# Patient Record
Sex: Female | Born: 1960 | Race: White | Hispanic: No | Marital: Single | Smoking: Never smoker
Health system: Southern US, Community
[De-identification: ages and names within clinical notes are randomized; demographics above are authoritative.]

## PROBLEM LIST (undated history)

## (undated) DIAGNOSIS — E119 Type 2 diabetes mellitus without complications: Secondary | ICD-10-CM

## (undated) DIAGNOSIS — E559 Vitamin D deficiency, unspecified: Secondary | ICD-10-CM

## (undated) DIAGNOSIS — I10 Essential (primary) hypertension: Secondary | ICD-10-CM

## (undated) DIAGNOSIS — E785 Hyperlipidemia, unspecified: Secondary | ICD-10-CM

## (undated) DIAGNOSIS — R7309 Other abnormal glucose: Secondary | ICD-10-CM

## (undated) HISTORY — PX: WRIST SURGERY: SHX841

## (undated) HISTORY — DX: Hyperlipidemia, unspecified: E78.5

## (undated) HISTORY — DX: Vitamin D deficiency, unspecified: E55.9

## (undated) HISTORY — PX: CARPAL TUNNEL RELEASE: SHX101

## (undated) HISTORY — DX: Other abnormal glucose: R73.09

## (undated) HISTORY — PX: CHOLECYSTECTOMY: SHX55

## (undated) HISTORY — PX: APPENDECTOMY: SHX54

---

## 2016-04-19 ENCOUNTER — Emergency Department (HOSPITAL_COMMUNITY)
Admission: EM | Admit: 2016-04-19 | Discharge: 2016-04-19 | Disposition: A | Discharge status: Home / Self Care | Payer: Self-pay | Attending: Emergency Medicine | Admitting: Emergency Medicine

## 2016-04-19 ENCOUNTER — Encounter (HOSPITAL_COMMUNITY): Payer: Self-pay | Admitting: *Deleted

## 2016-04-19 ENCOUNTER — Emergency Department (HOSPITAL_COMMUNITY): Payer: Self-pay

## 2016-04-19 DIAGNOSIS — Z7984 Long term (current) use of oral hypoglycemic drugs: Secondary | ICD-10-CM | POA: Insufficient documentation

## 2016-04-19 DIAGNOSIS — X501XXA Overexertion from prolonged static or awkward postures, initial encounter: Secondary | ICD-10-CM | POA: Insufficient documentation

## 2016-04-19 DIAGNOSIS — Y939 Activity, unspecified: Secondary | ICD-10-CM | POA: Insufficient documentation

## 2016-04-19 DIAGNOSIS — I10 Essential (primary) hypertension: Secondary | ICD-10-CM | POA: Insufficient documentation

## 2016-04-19 DIAGNOSIS — M79671 Pain in right foot: Secondary | ICD-10-CM | POA: Insufficient documentation

## 2016-04-19 DIAGNOSIS — Y929 Unspecified place or not applicable: Secondary | ICD-10-CM | POA: Insufficient documentation

## 2016-04-19 DIAGNOSIS — E119 Type 2 diabetes mellitus without complications: Secondary | ICD-10-CM | POA: Insufficient documentation

## 2016-04-19 DIAGNOSIS — Y990 Civilian activity done for income or pay: Secondary | ICD-10-CM | POA: Insufficient documentation

## 2016-04-19 DIAGNOSIS — Z79899 Other long term (current) drug therapy: Secondary | ICD-10-CM | POA: Insufficient documentation

## 2016-04-19 HISTORY — DX: Essential (primary) hypertension: I10

## 2016-04-19 HISTORY — DX: Type 2 diabetes mellitus without complications: E11.9

## 2016-04-19 MED ORDER — IBUPROFEN 800 MG PO TABS: 800.0000 mg | ORAL_TABLET | Freq: Three times a day (TID) | ORAL | 0 refills | Status: DC

## 2016-04-19 MED ORDER — METHOCARBAMOL 500 MG PO TABS: 500.0000 mg | ORAL_TABLET | Freq: Two times a day (BID) | ORAL | 0 refills | Status: DC

## 2016-04-19 NOTE — ED Triage Notes (Signed)
Pt reports RT foot pain starting on SAt. Pt does stand a lot with her job.

## 2016-04-19 NOTE — ED Notes (Signed)
Declined W/C at D/C and was escorted to lobby by RN. 

## 2016-04-19 NOTE — ED Provider Notes (Signed)
[facility]-EMERGENCY DEPT Provider Note   CSN: [id number] Arrival date & time: 04/19/16  [id number]  By signing my name below, I, Orpah CobbMaurice Copeland, attest that this documentation has been prepared under the direction and in the presence of Tearah Saulsbury, PA-C. Electronically Signed: Orpah CobbMaurice Copeland , ED Scribe. 04/19/16. 10:56 AM.   History   Chief Complaint Chief Complaint  Patient presents with  . Foot Pain    HPI  Wanda Weaver is a 56 y.o. female who presents to the Emergency Department complaining of mild to moderate R foot pain with sudden onset x1 day. 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 This is both a new job and a new line of work for her. Yesterday, while at work she started experiencing burning and throbbing pain radiating from the R foot upward. Pt has used Meloxicam 15mg  and ice with mild relief and reports the pain being exacerbated with ambulation and movement of toes. Pt reports a pulling sensation with movement of the foot/ankle. Denies neuro deficits, known trauma, or any other complaints.     The history is provided by the patient. No language interpreter was used.    Past Medical History:  Diagnosis Date  . Diabetes mellitus without complication (HCC)   . Hypertension     There are no active problems to display for this patient.   Past Surgical History:  Procedure Laterality Date  . APPENDECTOMY    . CHOLECYSTECTOMY    . WRIST SURGERY Bilateral     OB History    No data available       Home Medications    Prior to Admission medications   Medication Sig Start Date End Date Taking? Authorizing Provider  lisinopril (PRINIVIL,ZESTRIL) 20 MG tablet Take 20 mg by mouth daily.   Yes Historical Provider, MD  meloxicam (MOBIC) 15 MG tablet Take 15 mg by mouth daily.   Yes Historical Provider, MD  metFORMIN (GLUCOPHAGE) 500 MG tablet Take 500 mg by mouth 2 (two) times daily with a meal.   Yes Historical Provider, MD  ibuprofen (ADVIL,MOTRIN) 800 MG  tablet Take 1 tablet (800 mg total) by mouth 3 (three) times daily. 04/19/16   Lukas Pelcher C Crystallee Werden, PA-C  methocarbamol (ROBAXIN) 500 MG tablet Take 1 tablet (500 mg total) by mouth 2 (two) times daily. 04/19/16   Anselm PancoastShawn C Paquita Printy, PA-C    Family History History reviewed. No pertinent family history.  Social History Social History  Substance Use Topics  . Smoking status: Never Smoker  . Smokeless tobacco: Never Used  . Alcohol use No     Allergies   Patient has no known allergies.   Review of Systems Review of Systems  Musculoskeletal: Positive for myalgias (R foot).  Skin: Negative for wound.  Neurological: Negative for weakness and numbness.     Physical Exam Updated Vital Signs BP 171/87 (BP Location: Right Arm)   Pulse 94   Temp 98.7 F (37.1 C) (Oral)   Resp 16   Ht 5\' 2"  (1.575 m)   Wt 192 lb (87.1 kg)   SpO2 99%   BMI 35.12 kg/m   Physical Exam  Constitutional: She appears well-developed and well-nourished. No distress.  HENT:  Head: Normocephalic and atraumatic.  Eyes: Conjunctivae are normal.  Neck: Neck supple.  Cardiovascular: Normal rate, regular rhythm and intact distal pulses.   Pulmonary/Chest: Effort normal.  Musculoskeletal: She exhibits tenderness. She exhibits no deformity.       Right foot:  There is tenderness and swelling.  Tenderness and minor swelling of the dorsal surface of the R foot. No noted crepitus, erythema, or deformity. Full ROM of the R ankle and foot. Pt is weightbearing.  Neurological: She is alert. She has normal strength. No sensory deficit.  No sensory deficits. Strength is 5/5 in bilateral feet and ankles.  Skin: Skin is warm and dry. Capillary refill takes less than 2 seconds. She is not diaphoretic.  Psychiatric: She has a normal mood and affect. Her behavior is normal.  Nursing note and vitals reviewed.    ED Treatments / Results   DIAGNOSTIC STUDIES: Oxygen Saturation is 99% on RA, normal by my interpretation.    COORDINATION OF CARE: 10:56 AM-Discussed next steps with pt. Pt verbalized understanding and is agreeable with the plan.    (all labs ordered are listed, but only abnormal results are displayed) Labs Reviewed - No data to display  EKG  EKG Interpretation None       Radiology Dg Foot Complete Right  Result Date: 04/19/2016 CLINICAL DATA:  Acute onset of right foot pain and burning/paresthesias while at work yesterday. No known injuries. EXAM: RIGHT FOOT COMPLETE - 3+ VIEW COMPARISON:  None. FINDINGS: No evidence of acute or subacute fracture or dislocation. Well-preserved joint spaces. Well-preserved bone mineral density. Small plantar calcaneal spur. Enthesopathic spurring at the insertion of the Achilles tendon on the posterior calcaneus. Calcification involving the anterior and posterior tibial arteries in the distal lower leg. IMPRESSION: 1. No acute or subacute osseous abnormality. 2. Small plantar calcaneal spur. 3. Atherosclerosis involving the anterior and posterior tibial arteries of the lower leg. Electronically Signed   By: Hulan Saas M.D.   On: 04/19/2016 10:22    Procedures Procedures (including critical care time)  Medications Ordered in ED Medications - No data to display   Initial Impression / Assessment and Plan / ED Course  I have reviewed the triage vital signs and the nursing notes.  Pertinent labs & imaging results that were available during my care of the patient were reviewed by me and considered in my medical decision making (see chart for details).  Clinical Course     Patient presents with pain to the right foot. Suspect patient's pain is connected with her increased time on her feet. Doubt DVT. No acute abnormalities on x-ray. Home care and return precautions discussed. Patient voices understanding of all instructions and is comfortable with discharge.    Final Clinical Impressions(s) / ED Diagnoses   Final diagnoses:  Foot pain, right     New Prescriptions New Prescriptions   IBUPROFEN (ADVIL,MOTRIN) 800 MG TABLET    Take 1 tablet (800 mg total) by mouth 3 (three) times daily.   METHOCARBAMOL (ROBAXIN) 500 MG TABLET    Take 1 tablet (500 mg total) by mouth 2 (two) times daily.   I personally performed the services described in this documentation, which was scribed in my presence. The recorded information has been reviewed and is accurate.    Anselm Pancoast, PA-C 04/19/16 1056    Anselm Pancoast, PA-C 04/19/16 1057    Lorre Nick, MD 04/21/16 [phone]

## 2016-04-19 NOTE — Discharge Instructions (Signed)
There were no abnormalities on the x-ray. Expect your soreness to increase over the next 2-3 days. Take it easy, but do not lay around too much as this may make any stiffness worse. Take 500 mg of naproxen every 12 hours or 800 mg of ibuprofen every 8 hours for the next 3 days. Take these medications with food to avoid upset stomach. Robaxin is a muscle relaxer and may help loosen stiff or spasming muscles. Do not take the Robaxin while driving or performing other dangerous activities. Be sure to perform the attached exercises starting with three times a week and working up to performing them daily. This is an essential part of preventing long term problems. Follow up with a primary care provider for any future management of these complaints.  Use the crutches and Ace wrap as needed for comfort and stability. Weightbearing as tolerated. Elevate the extremity whenever possible.

## 2016-05-03 ENCOUNTER — Emergency Department (HOSPITAL_COMMUNITY)
Admission: EM | Admit: 2016-05-03 | Discharge: 2016-05-03 | Disposition: A | Discharge status: Home / Self Care | Payer: Self-pay | Attending: Physician Assistant | Admitting: Physician Assistant

## 2016-05-03 ENCOUNTER — Encounter (HOSPITAL_COMMUNITY): Payer: Self-pay | Admitting: Emergency Medicine

## 2016-05-03 DIAGNOSIS — Z79899 Other long term (current) drug therapy: Secondary | ICD-10-CM | POA: Insufficient documentation

## 2016-05-03 DIAGNOSIS — E119 Type 2 diabetes mellitus without complications: Secondary | ICD-10-CM | POA: Insufficient documentation

## 2016-05-03 DIAGNOSIS — J069 Acute upper respiratory infection, unspecified: Secondary | ICD-10-CM | POA: Insufficient documentation

## 2016-05-03 DIAGNOSIS — J309 Allergic rhinitis, unspecified: Secondary | ICD-10-CM | POA: Insufficient documentation

## 2016-05-03 DIAGNOSIS — Z7984 Long term (current) use of oral hypoglycemic drugs: Secondary | ICD-10-CM | POA: Insufficient documentation

## 2016-05-03 DIAGNOSIS — I10 Essential (primary) hypertension: Secondary | ICD-10-CM | POA: Insufficient documentation

## 2016-05-03 MED ORDER — BENZONATATE 100 MG PO CAPS: 100.0000 mg | ORAL_CAPSULE | Freq: Three times a day (TID) | ORAL | 0 refills | Status: DC | PRN

## 2016-05-03 MED ORDER — FLUTICASONE PROPIONATE 50 MCG/ACT NA SUSP: 2.0000 | Freq: Every day | NASAL | 0 refills | Status: DC

## 2016-05-03 MED ORDER — CETIRIZINE HCL 10 MG PO TABS: 10.0000 mg | ORAL_TABLET | Freq: Every day | ORAL | 1 refills | Status: DC

## 2016-05-03 NOTE — ED Notes (Signed)
arrived

## 2016-05-03 NOTE — ED Notes (Signed)
Declined W/C at D/C and was escorted to lobby by RN. 

## 2016-05-03 NOTE — ED Triage Notes (Signed)
Pt reports nasal congestion for two days. Pt also reports she is losing her voice. Airway intact. Denies sore throat. Pt states she has had a cough.

## 2016-05-03 NOTE — ED Provider Notes (Signed)
[facility]-EMER[facility]ENCY DEPT Provider Note   CSN: [id number] Arrival date & time: 05/03/16  1125   By signing my name below, I, Valentino Saxon, attest that this documentation has been prepared under the direction and in the presence of Everlene Farrier, PA-C. Electronically Signed: Valentino Saxon, ED Scribe. 05/03/16. 1:42 PM.  History   Chief Complaint Chief Complaint  Patient presents with  . Nasal Congestion   The history is provided by the patient. No language interpreter was used.   HPI Comments: Wanda Weaver is a 56 y.o. female who presents to the Emergency Department complaining of moderate, constant, nasal congestion onset three days ago. She reports associated runny nose, postnasal drip and slight intermittent cough from post nasal drip. Pt notes using cough suppressants, Afrin Nasal spray along with a nasal decongestant with minimal relief. Denies sore throat, ear pain, CP, wheezing, fever, chills, nausea, vomiting, diarrhea, back pain.   Past Medical History:  Diagnosis Date  . Diabetes mellitus without complication (HCC)   . Hypertension     There are no active problems to display for this patient.   Past Surgical History:  Procedure Laterality Date  . APPENDECTOMY    . CHOLECYSTECTOMY    . WRIST SURGERY Bilateral     OB History    No data available       Home Medications    Prior to Admission medications   Medication Sig Start Date End Date Taking? Authorizing Provider  benzonatate (TESSALON) 100 MG capsule Take 1 capsule (100 mg total) by mouth 3 (three) times daily as needed. 05/03/16   Everlene Farrier, PA-C  cetirizine (ZYRTEC ALLERGY) 10 MG tablet Take 1 tablet (10 mg total) by mouth daily. 05/03/16   Everlene Farrier, PA-C  fluticasone (FLONASE) 50 MCG/ACT nasal spray Place 2 sprays into both nostrils daily. 05/03/16   Everlene Farrier, PA-C  ibuprofen (ADVIL,MOTRIN) 800 MG tablet Take 1 tablet (800 mg total) by mouth 3 (three) times daily. 04/19/16   Shawn C Joy, PA-C    lisinopril (PRINIVIL,ZESTRIL) 20 MG tablet Take 20 mg by mouth daily.    Historical Provider, MD  meloxicam (MOBIC) 15 MG tablet Take 15 mg by mouth daily.    Historical Provider, MD  metFORMIN (GLUCOPHAGE) 500 MG tablet Take 500 mg by mouth 2 (two) times daily with a meal.    Historical Provider, MD  methocarbamol (ROBAXIN) 500 MG tablet Take 1 tablet (500 mg total) by mouth 2 (two) times daily. 04/19/16   Anselm Pancoast, PA-C    Family History History reviewed. No pertinent family history.  Social History Social History  Substance Use Topics  . Smoking status: Never Smoker  . Smokeless tobacco: Never Used  . Alcohol use No     Allergies   Patient has no known allergies.   Review of Systems Review of Systems  Constitutional: Negative for chills and fever.  HENT: Positive for congestion, postnasal drip and rhinorrhea. Negative for ear pain, sore throat and trouble swallowing.   Eyes: Negative for visual disturbance.  Respiratory: Positive for cough. Negative for shortness of breath and wheezing.   Cardiovascular: Negative for chest pain.  Gastrointestinal: Negative for abdominal pain, diarrhea, nausea and vomiting.  Musculoskeletal: Negative for back pain.  Skin: Negative for rash.  Neurological: Negative for light-headedness and headaches.     Physical Exam Updated Vital Signs BP 145/89 (BP Location: Left Arm)   Pulse 96   Temp 99.2 F (37.3 C) (Oral)   Resp 15   Ht 5'  2" (1.575 m)   Wt 184 lb (83.5 kg)   SpO2 97%   BMI 33.65 kg/m   Physical Exam  Constitutional: She appears well-developed and well-nourished. No distress.  Nontoxic appearing.  HENT:  Head: Normocephalic and atraumatic.  Right Ear: External ear normal.  Left Ear: External ear normal.  Mouth/Throat: Oropharynx is clear and moist. No oropharyngeal exudate.  Mild mild ear effusion bilaterally. NoTM erythema or loss of landmarks.  Boggy nasal turbinates bilaterally. Rhinorrhea present. No tonsillar  hypertrophy or exudates. Throat is clear.  Eyes: Conjunctivae are normal. Pupils are equal, round, and reactive to light. Right eye exhibits no discharge. Left eye exhibits no discharge.  Neck: Normal range of motion. Neck supple. No JVD present. No tracheal deviation present.  Cardiovascular: Normal rate, regular rhythm, normal heart sounds and intact distal pulses.   Pulmonary/Chest: Effort normal and breath sounds normal. No stridor. No respiratory distress. She has no wheezes. She has no rales.  Lungs clear to auscultation bilaterally.  Abdominal: Soft. There is no tenderness.  Lymphadenopathy:    She has no cervical adenopathy.  Neurological: She is alert. Coordination normal.  Skin: Skin is warm and dry. Capillary refill takes less than 2 seconds. No rash noted. She is not diaphoretic. No erythema. No pallor.  Psychiatric: She has a normal mood and affect. Her behavior is normal.  Nursing note and vitals reviewed.    ED Treatments / Results   DIAGNOSTIC STUDIES: Oxygen Saturation is 97% on RA, normal by my interpretation.    COORDINATION OF CARE: 1:31 PM Discussed treatment plan with pt at bedside and pt agreed to plan.   Labs (all labs ordered are listed, but only abnormal results are displayed) Labs Reviewed - No data to display  EKG  EKG Interpretation None       Radiology No results found.  Procedures Procedures (including critical care time)  Medications Ordered in ED Medications - No data to display   Initial Impression / Assessment and Plan / ED Course  I have reviewed the triage vital signs and the nursing notes.  Pertinent labs & imaging results that were available during my care of the patient were reviewed by me and considered in my medical decision making (see chart for details).     This is a 56 y.o. female who presents to the Emergency Department complaining of moderate, constant, nasal congestion onset three days ago. She reports associated  runny nose, postnasal drip and slight intermittent cough from post nasal drip.  On exam the patient is afebrile nontoxic appearing. Throat is clear. Evidence of postnasal drip. Rhinorrhea present. Lungs clear auscultation bilaterally. Patient with upper respiratory infection. Will discharge with pressures of her Flonase, Zyrtec, Tessalon Perles. I discussed return precautions. I advised the patient to follow-up with their primary care provider this week. I advised the patient to return to the emergency department with new or worsening symptoms or new concerns. The patient verbalized understanding and agreement with plan.     Final Clinical Impressions(s) / ED Diagnoses   Final diagnoses:  Viral upper respiratory tract infection  Acute allergic rhinitis, unspecified seasonality, unspecified trigger    New Prescriptions New Prescriptions   BENZONATATE (TESSALON) 100 MG CAPSULE    Take 1 capsule (100 mg total) by mouth 3 (three) times daily as needed.   CETIRIZINE (ZYRTEC ALLERGY) 10 MG TABLET    Take 1 tablet (10 mg total) by mouth daily.   FLUTICASONE (FLONASE) 50 MCG/ACT NASAL SPRAY  Place 2 sprays into both nostrils daily.    I personally performed the services described in this documentation, which was scribed in my presence. The recorded information has been reviewed and is accurate.      Everlene FarrierWilliam Harumi Yamin, PA-C 05/03/16 1351    Courteney Lyn Mackuen, MD 05/04/16 [phone]

## 2016-09-06 ENCOUNTER — Emergency Department (HOSPITAL_COMMUNITY)
Admission: EM | Admit: 2016-09-06 | Discharge: 2016-09-07 | Disposition: A | Discharge status: Home / Self Care | Payer: Worker's Compensation | Attending: Emergency Medicine | Admitting: Emergency Medicine

## 2016-09-06 ENCOUNTER — Emergency Department (HOSPITAL_COMMUNITY): Payer: Worker's Compensation

## 2016-09-06 ENCOUNTER — Encounter (HOSPITAL_COMMUNITY): Payer: Self-pay | Admitting: Emergency Medicine

## 2016-09-06 DIAGNOSIS — Z79899 Other long term (current) drug therapy: Secondary | ICD-10-CM | POA: Diagnosis not present

## 2016-09-06 DIAGNOSIS — E119 Type 2 diabetes mellitus without complications: Secondary | ICD-10-CM | POA: Diagnosis not present

## 2016-09-06 DIAGNOSIS — Z7984 Long term (current) use of oral hypoglycemic drugs: Secondary | ICD-10-CM | POA: Insufficient documentation

## 2016-09-06 DIAGNOSIS — I10 Essential (primary) hypertension: Secondary | ICD-10-CM | POA: Diagnosis not present

## 2016-09-06 DIAGNOSIS — M25531 Pain in right wrist: Secondary | ICD-10-CM | POA: Insufficient documentation

## 2016-09-06 NOTE — ED Notes (Signed)
Pt from work following an arm injury at 1630. 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 Pt has pain and decreased ROM in 4th and 5th finger and wrist of right arm

## 2016-09-06 NOTE — Discharge Instructions (Signed)
Please use a wrist splint as needed. Keep good range of motion to wrist and use ibuprofen or Tylenol as needed for pain. Use rest, ice, compression, elevation to right hand. Please follow-up with hand surgery, Dr. Amanda PeaGramig, regarding today's visit and 1-2 weeks as needed.   Get help right away if: You lose feeling in your fingers or hand. Your fingers turn white, very red, or cold and blue. You cannot move your fingers. You have a fever or chills.

## 2016-09-06 NOTE — ED Provider Notes (Signed)
[facility]-EMERGENCY DEPT Provider Note   CSN: [id number] Arrival date & time: 09/06/16  2151     History   Chief Complaint Chief Complaint  Patient presents with  . Arm Injury    HPI Wanda Weaver is a 56 y.o. female.  The history is provided by the patient. No language interpreter was used.  Arm Injury   This is a new problem. The current episode started 6 to 12 hours ago. The problem occurs constantly. The problem has been gradually worsening. The pain is present in the right wrist. The quality of the pain is described as pounding. The pain is at a severity of 6/10. Associated symptoms include limited range of motion and tingling (in 4th and 5th fingers of right hand). The symptoms are aggravated by contact. She has tried nothing for the symptoms. History of extremity trauma: wrist was banged against a door.     Past Medical History:  Diagnosis Date  . Diabetes mellitus without complication (HCC)   . Hypertension     There are no active problems to display for this patient.   Past Surgical History:  Procedure Laterality Date  . APPENDECTOMY    . CHOLECYSTECTOMY    . WRIST SURGERY Bilateral     OB History    No data available       Home Medications    Prior to Admission medications   Medication Sig Start Date End Date Taking? Authorizing Provider  benzonatate (TESSALON) 100 MG capsule Take 1 capsule (100 mg total) by mouth 3 (three) times daily as needed. 05/03/16   Everlene Farrier, PA-C  cetirizine (ZYRTEC ALLERGY) 10 MG tablet Take 1 tablet (10 mg total) by mouth daily. 05/03/16   Everlene Farrier, PA-C  fluticasone (FLONASE) 50 MCG/ACT nasal spray Place 2 sprays into both nostrils daily. 05/03/16   Everlene Farrier, PA-C  ibuprofen (ADVIL,MOTRIN) 800 MG tablet Take 1 tablet (800 mg total) by mouth 3 (three) times daily. 04/19/16   Joy, Shawn C, PA-C  lisinopril (PRINIVIL,ZESTRIL) 20 MG tablet Take 20 mg by mouth daily.    [provider]  meloxicam (MOBIC) 15 MG  tablet Take 15 mg by mouth daily.    [provider]  metFORMIN (GLUCOPHAGE) 500 MG tablet Take 500 mg by mouth 2 (two) times daily with a meal.    [provider]  methocarbamol (ROBAXIN) 500 MG tablet Take 1 tablet (500 mg total) by mouth 2 (two) times daily. 04/19/16   Joy, Hillard Danker, PA-C    Family History No family history on file.  Social History Social History  Substance Use Topics  . Smoking status: Never Smoker  . Smokeless tobacco: Never Used  . Alcohol use No     Allergies   Patient has no known allergies.   Review of Systems Review of Systems  Constitutional: Negative for fever.  Musculoskeletal: Positive for arthralgias.  Neurological: Positive for tingling (in 4th and 5th fingers of right hand).     Physical Exam Updated Vital Signs BP (!) 143/83 (BP Location: Left Arm)   Pulse 95   Temp 98.4 F (36.9 C) (Oral)   Resp 16   Ht 5\' 2"  (1.575 m)   Wt 82.6 kg (182 lb)   SpO2 100%   BMI 33.29 kg/m   Physical Exam  Constitutional: She appears well-developed and well-nourished.  Well appearing  HENT:  Head: Normocephalic and atraumatic.  Nose: Nose normal.  Eyes: Conjunctivae and EOM are normal.  Neck: Normal range of motion.  Cardiovascular: Normal rate and intact distal pulses.   2+intact distal pulses.   Pulmonary/Chest: Effort normal. No respiratory distress.  Normal work of breathing. No respiratory distress noted.   Abdominal: Soft.  Musculoskeletal:  Mildly tender to palpation to wrist on dorsal aspect and ulnar side. No obvious pinpoint bony tenderness. No redness, swelling, deformity noted. No wound. Limited range of motion secondary to pain, however full passive range of motion.  No tenderness to anatomical snuffbox to right hand.  Neurological: She is alert. No sensory deficit.  Sensation intact. Muscle strength 5/5 and good against resistance to wrist flexion and extension and handgrip.  Skin: Skin is warm. Capillary refill  takes less than 2 seconds.  Psychiatric: She has a normal mood and affect. Her behavior is normal.  Nursing note and vitals reviewed.    ED Treatments / Results  Labs (all labs ordered are listed, but only abnormal results are displayed) Labs Reviewed - No data to display  EKG  EKG Interpretation None       Radiology Dg Wrist Complete Right  Result Date: 09/06/2016 CLINICAL DATA:  Status post right arm injury, with right wrist pain. Initial encounter. EXAM: RIGHT WRIST - COMPLETE 3+ VIEW COMPARISON:  None. FINDINGS: There is no evidence of fracture or dislocation. The carpal rows are intact, and demonstrate normal alignment. The joint spaces are preserved. No significant soft tissue abnormalities are seen. IMPRESSION: No evidence of fracture or dislocation. Electronically Signed   By: Roanna RaiderJeffery  Chang M.D.   On: 09/06/2016 22:40   Dg Hand Complete Right  Addendum Date: 09/06/2016   ADDENDUM REPORT: 09/06/2016 23:18 ADDENDUM: The Findings should read: There is no evidence of fracture or dislocation. The joint spaces are preserved. The carpal rows are intact, and demonstrate normal alignment. The soft tissues are unremarkable in appearance. Electronically Signed   By: Roanna RaiderJeffery  Chang M.D.   On: 09/06/2016 23:18   Result Date: 09/06/2016 CLINICAL DATA:  Acute onset of pain and decreased range of motion at the right fourth and fifth fingers and right wrist, status post injury. Initial encounter. EXAM: RIGHT HAND - COMPLETE 3+ VIEW COMPARISON:  None. FINDINGS: The stomach is unremarkable in appearance. The small bowel is within normal limits. The appendix is normal in caliber, without evidence of appendicitis. The colon is unremarkable in appearance. IMPRESSION: No evidence of fracture or dislocation. Electronically Signed: By: Roanna RaiderJeffery  Chang M.D. On: 09/06/2016 22:39    Procedures Procedures (including critical care time)  Medications Ordered in ED Medications - No data to  display   Initial Impression / Assessment and Plan / ED Course  I have reviewed the triage vital signs and the nursing notes.  Pertinent labs & imaging results that were available during my care of the patient were reviewed by me and considered in my medical decision making (see chart for details).    Patient X-Ray negative for obvious fracture or dislocation. Pt advised to follow up with hand surgery if symptoms persist for possibility of missed fracture diagnosis. Patient given brace while in ED, conservative therapy recommended and discussed. Patient in NAD, hemodynamically stable, afebrile. Patient will be dc home & is agreeable with above plan. Return precautions given.   Final Clinical Impressions(s) / ED Diagnoses   Final diagnoses:  Right wrist pain    New Prescriptions New Prescriptions   No medications on file     Alvina Chouspina, Audry Pecina Manuel, [place] 09/06/16 2358    Nira Connardama, Pedro Eduardo, MD 09/07/16 [phone]

## 2017-05-04 ENCOUNTER — Emergency Department (HOSPITAL_COMMUNITY)
Admission: EM | Admit: 2017-05-04 | Discharge: 2017-05-04 | Disposition: A | Discharge status: Home / Self Care | Payer: Self-pay | Attending: Emergency Medicine | Admitting: Emergency Medicine

## 2017-05-04 ENCOUNTER — Encounter (HOSPITAL_COMMUNITY): Payer: Self-pay

## 2017-05-04 ENCOUNTER — Other Ambulatory Visit: Payer: Self-pay

## 2017-05-04 ENCOUNTER — Emergency Department (HOSPITAL_COMMUNITY): Payer: Self-pay

## 2017-05-04 DIAGNOSIS — E119 Type 2 diabetes mellitus without complications: Secondary | ICD-10-CM | POA: Insufficient documentation

## 2017-05-04 DIAGNOSIS — M2550 Pain in unspecified joint: Secondary | ICD-10-CM | POA: Insufficient documentation

## 2017-05-04 DIAGNOSIS — Z7984 Long term (current) use of oral hypoglycemic drugs: Secondary | ICD-10-CM | POA: Insufficient documentation

## 2017-05-04 DIAGNOSIS — Z79899 Other long term (current) drug therapy: Secondary | ICD-10-CM | POA: Insufficient documentation

## 2017-05-04 DIAGNOSIS — M25512 Pain in left shoulder: Secondary | ICD-10-CM | POA: Insufficient documentation

## 2017-05-04 DIAGNOSIS — I10 Essential (primary) hypertension: Secondary | ICD-10-CM | POA: Insufficient documentation

## 2017-05-04 MED ORDER — DICLOFENAC SODIUM 3 % TD GEL: 1.0000 "application " | Freq: Two times a day (BID) | TRANSDERMAL | 0 refills | Status: DC

## 2017-05-04 MED ORDER — ACETAMINOPHEN 325 MG PO TABS: 650.0000 mg | ORAL_TABLET | Freq: Four times a day (QID) | ORAL | 1 refills | Status: DC | PRN

## 2017-05-04 NOTE — ED Provider Notes (Signed)
[facility] EMERGENCY [facility]PARTMENT Provider Note   CSN: [id number] Arrival date & time: 05/04/17  1146     History   Chief Complaint Chief Complaint  Patient presents with  . Shoulder Pain    HPI Wanda Weaver is a 57 y.o. female.  Wanda Weaver is a 57 y.o. Female who presents to the emergency department complaining of left shoulder pain for the past 3 days.  Patient reports 3 days she was having some pain with movement in her left shoulder and she moved her arm up and felt a pop which made her pain worse.  She reports she worked her job at [employer] yesterday and did lots of heavy lifting which she believes exacerbated her pain.  She reports she has pain with range of motion of her left shoulder.  If she holds her shoulder still she has less pain.  She has had no chest pain or shortness of breath.  She denies any numbness, tingling or weakness.  She tried using arthritis gel without relief.    The history is provided by the patient and medical records. No language interpreter was used.  Shoulder Pain   Pertinent negatives include no numbness.    Past Medical History:  Diagnosis Date  . Diabetes mellitus without complication (HCC)   . Hypertension     There are no active problems to display for this patient.   Past Surgical History:  Procedure Laterality Date  . APPENDECTOMY    . CHOLECYSTECTOMY    . WRIST SURGERY Bilateral     OB History    No data available       Home Medications    Prior to Admission medications   Medication Sig Start Date End Date Taking? Authorizing Provider  acetaminophen (TYLENOL) 325 MG tablet Take 2 tablets (650 mg total) by mouth every 6 (six) hours as needed for mild pain or moderate pain. 05/04/17   Everlene Farrieransie, Caedyn Tassinari, PA-C  benzonatate (TESSALON) 100 MG capsule Take 1 capsule (100 mg total) by mouth 3 (three) times daily as needed. 05/03/16   Everlene Farrieransie, Elyza Whitt, PA-C  cetirizine (ZYRTEC ALLERGY) 10 MG tablet Take 1 tablet (10  mg total) by mouth daily. 05/03/16   Everlene Farrieransie, Genessis Flanary, PA-C  Diclofenac Sodium 3 % GEL Place 1 application onto the skin 2 (two) times daily. To affected area. 05/04/17   Everlene Farrieransie, Shayana Hornstein, PA-C  fluticasone (FLONASE) 50 MCG/ACT nasal spray Place 2 sprays into both nostrils daily. 05/03/16   Everlene Farrieransie, Marcelene Weidemann, PA-C  ibuprofen (ADVIL,MOTRIN) 800 MG tablet Take 1 tablet (800 mg total) by mouth 3 (three) times daily. 04/19/16   Joy, Shawn C, PA-C  lisinopril (PRINIVIL,ZESTRIL) 20 MG tablet Take 20 mg by mouth daily.    [provider]  meloxicam (MOBIC) 15 MG tablet Take 15 mg by mouth daily.    [provider]  metFORMIN (GLUCOPHAGE) 500 MG tablet Take 500 mg by mouth 2 (two) times daily with a meal.    [provider]  methocarbamol (ROBAXIN) 500 MG tablet Take 1 tablet (500 mg total) by mouth 2 (two) times daily. 04/19/16   Joy, Hillard DankerShawn C, PA-C    Family History No family history on file.  Social History Social History   Tobacco Use  . Smoking status: Never Smoker  . Smokeless tobacco: Never Used  Substance Use Topics  . Alcohol use: No  . Drug use: No     Allergies   Patient has no known allergies.   Review of  Systems Review of Systems  Constitutional: Negative for fever.  Respiratory: Negative for cough and shortness of breath.   Cardiovascular: Negative for chest pain.  Musculoskeletal: Positive for arthralgias. Negative for back pain and neck pain.  Skin: Negative for rash and wound.  Neurological: Negative for weakness and numbness.     Physical Exam Updated Vital Signs BP (!) 192/94 (BP Location: Right Arm)   Pulse 87   Temp 98.3 F (36.8 C) (Oral)   Resp 16   SpO2 100%   Physical Exam  Constitutional: She appears well-developed and well-nourished. No distress.  HENT:  Head: Normocephalic and atraumatic.  Eyes: Right eye exhibits no discharge. Left eye exhibits no discharge.  Cardiovascular: Normal rate, regular rhythm and intact distal pulses.   Bilateral radial pulses are intact.  Pulmonary/Chest: Effort normal. No respiratory distress.  Musculoskeletal: She exhibits tenderness. She exhibits no edema or deformity.  Tenderness noted to the anterior aspect of her left shoulder.  No clavicle tenderness bilaterally.  No overlying skin changes.  Pain is worse with adduction of her left shoulder.  No tenderness noted to her left elbow.  Good strength at her bicep and tricep.  She is able to place her left hand above her head. She has good ROM and strength at left shoulder, albeit with pain.    Neurological: She is alert. No sensory deficit. She exhibits normal muscle tone. Coordination normal.  Skin: Skin is warm and dry. Capillary refill takes less than 2 seconds. No rash noted. She is not diaphoretic. No erythema. No pallor.  Psychiatric: She has a normal mood and affect. Her behavior is normal.  Nursing note and vitals reviewed.    ED Treatments / Results  Labs (all labs ordered are listed, but only abnormal results are displayed) Labs Reviewed - No data to display  EKG  EKG Interpretation None       Radiology Dg Shoulder Left  Result Date: 05/04/2017 CLINICAL DATA:  Raise LEFT arm above head and felt a pop on Sunday, pain since, difficulty raising LEFT arm, history hypertension, diabetes mellitus EXAM: LEFT SHOULDER - 2+ VIEW COMPARISON:  None FINDINGS: Osseous mineralization normal. Mild degenerative changes LEFT AC joint. No acute fracture, dislocation, or bone destruction. Visualized LEFT ribs unremarkable. IMPRESSION: No acute abnormalities. Mild degenerative changes LEFT AC joint. Electronically Signed   By: Ulyses Southward M.D.   On: 05/04/2017 12:27    Procedures Procedures (including critical care time)  Medications Ordered in ED Medications - No data to display   Initial Impression / Assessment and Plan / ED Course  I have reviewed the triage vital signs and the nursing notes.  Pertinent labs & imaging results  that were available during my care of the patient were reviewed by me and considered in my medical decision making (see chart for details).     This is a 57 y.o. Female who presents to the emergency department complaining of left shoulder pain for the past 3 days.  Patient reports 3 days she was having some pain with movement in her left shoulder and she moved her arm up and felt a pop which made her pain worse.  She reports she worked her job at [employer] and did lots of heavy lifting which she believes exacerbated her pain.  She reports she has pain with range of motion of her left shoulder.  If she holds her shoulder still she has less pain.  She has had no chest pain or shortness of  breath.  She denies any numbness, tingling or weakness.  She tried using arthritis gel without relief.  On exam the patient is afebrile and nontoxic-appearing.  She has pain that seems to be worse with adduction of her left shoulder.  She has good strength at her bicep and tricep.  She is able to place her hand above her head.  She has no overlying skin changes.  No clavicle tenderness bilaterally.  X-ray of her left shoulder shows no acute abnormalities.  It does show mild degenerative changes of her left AC joint.  She will need follow-up with orthopedics.  I recommended Tylenol and topical diclofenac gel for her pain due to patient's history of diabetes and hypertension.  Also encouraged ice and rest.  Will provide with work note to return to work with no heavy lifting.  Return precautions discussed. I advised the patient to follow-up with their primary care provider this week. I advised the patient to return to the emergency department with new or worsening symptoms or new concerns. The patient verbalized understanding and agreement with plan.      Final Clinical Impressions(s) / ED Diagnoses   Final diagnoses:  Acute pain of left shoulder    ED Discharge Orders        Ordered    Diclofenac Sodium 3  % GEL  2 times daily     05/04/17 1346    acetaminophen (TYLENOL) 325 MG tablet  Every 6 hours PRN     05/04/17 1346       Everlene Farrier, PA-C 05/04/17 1354    Eber Hong, MD 05/04/17 [phone]

## 2017-05-04 NOTE — ED Notes (Signed)
Pt verbalized understanding of discharge instructions and denies any further questions at this time.   

## 2017-05-04 NOTE — ED Triage Notes (Signed)
Pt states she raised left arm above head on Sunday and felt a "pop". Pt reports she has been experiencing pain in arm since then.

## 2017-05-25 ENCOUNTER — Encounter: Payer: Self-pay | Admitting: Family Medicine

## 2017-05-25 ENCOUNTER — Ambulatory Visit (INDEPENDENT_AMBULATORY_CARE_PROVIDER_SITE_OTHER): Payer: Self-pay | Admitting: Family Medicine

## 2017-05-25 VITALS — BP 150/74 | HR 90 | Temp 98.8°F | Resp 16 | Ht 62.0 in | Wt 186.0 lb

## 2017-05-25 DIAGNOSIS — E118 Type 2 diabetes mellitus with unspecified complications: Secondary | ICD-10-CM

## 2017-05-25 DIAGNOSIS — K429 Umbilical hernia without obstruction or gangrene: Secondary | ICD-10-CM

## 2017-05-25 DIAGNOSIS — Z794 Long term (current) use of insulin: Secondary | ICD-10-CM

## 2017-05-25 DIAGNOSIS — Z1329 Encounter for screening for other suspected endocrine disorder: Secondary | ICD-10-CM

## 2017-05-25 DIAGNOSIS — Z131 Encounter for screening for diabetes mellitus: Secondary | ICD-10-CM

## 2017-05-25 DIAGNOSIS — I10 Essential (primary) hypertension: Secondary | ICD-10-CM

## 2017-05-25 LAB — POCT URINALYSIS DIP (DEVICE)
Bilirubin Urine: NEGATIVE
Glucose, UA: >=1000 mg/dL — AB
Ketones, ur: NEGATIVE mg/dL
Nitrite: NEGATIVE
Protein, ur: NEGATIVE mg/dL
Specific Gravity, Urine: 1.015 (ref 1.005–1.030)
Urobilinogen, UA: 0.2 mg/dL (ref 0.0–1.0)
pH: 5 (ref 5.0–8.0)

## 2017-05-25 LAB — POCT GLYCOSYLATED HEMOGLOBIN (HGB A1C): HEMOGLOBIN A1C: 11.6

## 2017-05-25 MED ORDER — LISINOPRIL 20 MG PO TABS: 20.0000 mg | ORAL_TABLET | Freq: Every day | ORAL | 1 refills | Status: DC

## 2017-05-25 MED ORDER — METFORMIN HCL 500 MG PO TABS: 1000.0000 mg | ORAL_TABLET | Freq: Two times a day (BID) | ORAL | 1 refills | Status: DC

## 2017-05-25 MED ORDER — GLIPIZIDE 10 MG PO TABS: 10.0000 mg | ORAL_TABLET | Freq: Two times a day (BID) | ORAL | 3 refills | Status: DC

## 2017-05-25 MED ORDER — DICLOFENAC SODIUM 75 MG PO TBEC: 75.0000 mg | DELAYED_RELEASE_TABLET | Freq: Two times a day (BID) | ORAL | 1 refills | Status: DC

## 2017-05-25 MED FILL — ?DICLOFENAC SOD DR 75 MG TA: 75 | 15 days supply | Qty: 30 | Fill #0

## 2017-05-25 MED FILL — LISINOPRIL 20 MG TAB: 20 | 30 days supply | Qty: 30 | Fill #0

## 2017-05-25 MED FILL — ?GLIPIZIDE 10 MG TABLET: 10 | 30 days supply | Qty: 60 | Fill #0

## 2017-05-25 MED FILL — ?METFORMIN HCL 500MG TABLET: 500 | 30 days supply | Qty: 120 | Fill #0

## 2017-05-25 NOTE — Progress Notes (Signed)
Patient ID: Wanda Weaver, female    DOB: 15-Dec-1960, 57 y.o.   MRN: [id number]  PCP: Bing Neighbors, FNP  Chief Complaint  Patient presents with  . Establish Care  . Hospitalization Follow-up    Subjective:  HPI-Wanda Weaver is a 57 y.o. female, with hypertension, type 2 diabetes, presents to establish care and ED follow-up.  [name] reports no recent management of diabetes. She has been without health insurance and has utilized the ED for primary care needs.Uncertain of last A1C.Family history significant for diabetes and hypertension (both parents).  No recent eye exam, although, reports some blurring of vision. She doesn't monitor blood sugar. Reports previously  prescribed metformin. No routine physical activity. Denies chest pain, shortness of breath, dizziness, newweakness, BLE edema, or chroniccough. 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 She was prescribed diclofenac gel by the ED provider however she could not afford cost of medication. She has attempted to manage pain with acetaminophen without relief of symptoms.  Social History   Socioeconomic History  . Marital status: Single    Spouse name: Not on file  . Number of children: Not on file  . Years of education: Not on file  . Highest education level: Not on file  Social Needs  . Financial resource strain: Not on file  . Food insecurity - worry: Not on file  . Food insecurity - inability: Not on file  . Transportation needs - medical: Not on file  . Transportation needs - non-medical: Not on file  Occupational History  . Not on file  Tobacco Use  . Smoking status: Never Smoker  . Smokeless tobacco: Never Used  Substance and Sexual Activity  . Alcohol use: No  . Drug use: No  . Sexual activity: Not on file  Other Topics Concern  . Not on file  Social History Narrative  . Not on file    Family History  Problem Relation Age of Onset  . Diabetes Mother   . Dementia  Mother   . Diabetes Father   . Hypertension Father     Review of Systems    No Known Allergies  Prior to Admission medications   Medication Sig Start Date End Date Taking? Authorizing Provider  lisinopril (PRINIVIL,ZESTRIL) 20 MG tablet Take 20 mg by mouth daily.   Yes [provider]  metFORMIN (GLUCOPHAGE) 500 MG tablet Take 500 mg by mouth 2 (two) times daily with a meal.   Yes [provider]  acetaminophen (TYLENOL) 325 MG tablet Take 2 tablets (650 mg total) by mouth every 6 (six) hours as needed for mild pain or moderate pain. Patient not taking: Reported on 05/25/2017 05/04/17   Everlene Farrier, PA-C  benzonatate (TESSALON) 100 MG capsule Take 1 capsule (100 mg total) by mouth 3 (three) times daily as needed. Patient not taking: Reported on 05/25/2017 05/03/16   Everlene Farrier, PA-C  cetirizine (ZYRTEC ALLERGY) 10 MG tablet Take 1 tablet (10 mg total) by mouth daily. Patient not taking: Reported on 05/25/2017 05/03/16   Everlene Farrier, PA-C  Diclofenac Sodium 3 % GEL Place 1 application onto the skin 2 (two) times daily. To affected area. Patient not taking: Reported on 05/25/2017 05/04/17   Everlene Farrier, PA-C  fluticasone [facility]) 50 MCG/ACT nasal spray Place 2 sprays into both nostrils daily. Patient not taking: Reported on 05/25/2017 05/03/16   Everlene Farrier, PA-C  ibuprofen (ADVIL,MOTRIN) 800 MG tablet Take 1 tablet (800 mg total)  by mouth 3 (three) times daily. Patient not taking: Reported on 05/25/2017 04/19/16   Anselm Pancoast, PA-C  meloxicam (MOBIC) 15 MG tablet Take 15 mg by mouth daily.    [provider]  methocarbamol (ROBAXIN) 500 MG tablet Take 1 tablet (500 mg total) by mouth 2 (two) times daily. Patient not taking: Reported on 05/25/2017 04/19/16   Anselm Pancoast, PA-C    Past Medical, Surgical Family and Social History reviewed and updated.    Objective:   Today's Vitals   05/25/17 0855  BP: (!) 150/74  Pulse: 90  Resp: 16  Temp: 98.8  F (37.1 C)  TempSrc: Oral  SpO2: 96%  Weight: 186 lb (84.4 kg)  Height: 5\' 2"  (1.575 m)    Wt Readings from Last 3 Encounters:  05/25/17 186 lb (84.4 kg)  05/03/16 184 lb (83.5 kg)  04/19/16 192 lb (87.1 kg)   Physical Exam  Constitutional: She is oriented to person, place, and time. She appears well-developed and well-nourished.  Pulmonary/Chest: Effort normal and breath sounds normal.  Abdominal: Bowel sounds are normal. There is tenderness. A hernia is present.    Musculoskeletal:       Left shoulder: She exhibits tenderness and crepitus. She exhibits normal range of motion.  Neurological: She is alert and oriented to person, place, and time.  Psychiatric: She has a normal mood and affect. Her behavior is normal. Judgment and thought content normal.   Assessment & Plan:  1. Type 2 diabetes mellitus with complication, with long-term current use of insulin (HCC) - POCT glycosylated hemoglobin (Hb A1C) 11.6, uncontrolled. Inconsistent medication use due to lack of health insurance. Increased metformin 1000 mg twice daily and add glipizide 10 mg twice daily. Patient is a good  candidate for long-acting insulin for for better improvement of glycemic control.  However she wishes to defer this option for now. Return in two weeks for fasting glucose.  2. Essential hypertension, uncontrolled. Resuming antihypertensive therapy. We have discussed target BP range and blood pressure goal. I have advised patient to check BP regularly and to call us back or report to clinic if the numbers are consistently higher than 140/90. We discussed the importance of compliance with medical therapy and DASH diet recommended, consequences of uncontrolled hypertension discussed.   3. Screening for thyroid disorder- Thyroid Panel With TSH  4. Umbilical hernia without obstruction and without gangrene- Ambulatory referral to General Surgery at [facility].  Patient has been provided a  [facility] financial assistance application for general surgery consult.  Request a second opinion duration of surgical removal of umbilical hernia.  Meds ordered this encounter  Medications  . diclofenac (VOLTAREN) 75 MG EC tablet    Sig: Take 1 tablet (75 mg total) by mouth 2 (two) times daily.    Dispense:  30 tablet    Refill:  1    Order Specific Question:   Supervising Provider    Answer:   Quentin Angst [id number]  . lisinopril (PRINIVIL,ZESTRIL) 20 MG tablet    Sig: Take 1 tablet (20 mg total) by mouth daily.    Dispense:  90 tablet    Refill:  1    Order Specific Question:   Supervising Provider    Answer:   Quentin Angst [id number]  . metFORMIN (GLUCOPHAGE) 500 MG tablet    Sig: Take 2 tablets (1,000 mg total) by mouth 2 (two) times daily with a meal.    Dispense:  180 tablet    Refill:  1    Order Specific Question:   Supervising Provider    Answer:   Quentin AngstJEGEDE, OLUGBEMIGA E [id number]]  . glipiZIDE (GLUCOTROL) 10 MG tablet    Sig: Take 1 tablet (10 mg total) by mouth 2 (two) times daily before a meal.    Dispense:  60 tablet    Refill:  3    Order Specific Question:   Supervising Provider    Answer:   Quentin AngstJEGEDE, OLUGBEMIGA E [[id number]]    Patient given a [facility] Financial Assistance Application for general surgery referral.  RTC: 2 weeks blood pressure check and 6 weeks chronic condition management.   Godfrey PickKimberly S. Tiburcio PeaHarris, MSN, FNP-C [facility]  [address] [place], [place] [address] [phone]

## 2017-05-25 NOTE — Patient Instructions (Addendum)
I have increased her metformin to 1000 mg twice daily with food.  I am adding glipizide 10 mg twice daily with food.  Your A1c today is 11.6, goal A1c is less than 7.  We will attempt this regimen in combination with dietary management and exercise in hopes of improving her overall A1c.  Continue other medications as prescribed.  Return in 2 weeks for blood pressure check and ensure that she is taking her medication prior to the visit.  You will be made aware of any abnormal lab results via phone.  For your shoulder I have prescribed diclofenac 75 mg twice daily for total of 15 days in addition I recommend continuing warm and cold compresses to shoulder.  For pain relief he can continue acetaminophen 500 mg every 8 hours as needed.    Diabetes Mellitus and Nutrition When you have diabetes (diabetes mellitus), it is very important to have healthy eating habits because your blood sugar (glucose) levels are greatly affected by what you eat and drink. Eating healthy foods in the appropriate amounts, at about the same times every day, can help you:  Control your blood glucose.  Lower your risk of heart disease.  Improve your blood pressure.  Reach or maintain a healthy weight.  Every person with diabetes is different, and each person has different needs for a meal plan. Your health care provider may recommend that you work with a diet and nutrition specialist (dietitian) to make a meal plan that is best for you. Your meal plan may vary depending on factors such as:  The calories you need.  The medicines you take.  Your weight.  Your blood glucose, blood pressure, and cholesterol levels.  Your activity level.  Other health conditions you have, such as heart or kidney disease.  How do carbohydrates affect me? Carbohydrates affect your blood glucose level more than any other type of food. Eating carbohydrates naturally increases the amount of glucose in your blood. Carbohydrate counting  is a method for keeping track of how many carbohydrates you eat. Counting carbohydrates is important to keep your blood glucose at a healthy level, especially if you use insulin or take certain oral diabetes medicines. It is important to know how many carbohydrates you can safely have in each meal. This is different for every person. Your dietitian can help you calculate how many carbohydrates you should have at each meal and for snack. Foods that contain carbohydrates include:  Bread, cereal, rice, pasta, and crackers.  Potatoes and corn.  Peas, beans, and lentils.  Milk and yogurt.  Fruit and juice.  Desserts, such as cakes, cookies, ice cream, and candy.  How does alcohol affect me? Alcohol can cause a sudden decrease in blood glucose (hypoglycemia), especially if you use insulin or take certain oral diabetes medicines. Hypoglycemia can be a life-threatening condition. Symptoms of hypoglycemia (sleepiness, dizziness, and confusion) are similar to symptoms of having too much alcohol. If your health care provider says that alcohol is safe for you, follow these guidelines:  Limit alcohol intake to no more than 1 drink per day for nonpregnant women and 2 drinks per day for men. One drink equals 12 oz of beer, 5 oz of wine, or 1 oz of hard liquor.  Do not drink on an empty stomach.  Keep yourself hydrated with water, diet soda, or unsweetened iced tea.  Keep in mind that regular soda, juice, and other mixers may contain a lot of sugar and must be counted as carbohydrates.  What are tips for following this plan? Reading food labels  Start by checking the serving size on the label. The amount of calories, carbohydrates, fats, and other nutrients listed on the label are based on one serving of the food. Many foods contain more than one serving per package.  Check the total grams (g) of carbohydrates in one serving. You can calculate the number of servings of carbohydrates in one serving  by dividing the total carbohydrates by 15. For example, if a food has 30 g of total carbohydrates, it would be equal to 2 servings of carbohydrates.  Check the number of grams (g) of saturated and trans fats in one serving. Choose foods that have low or no amount of these fats.  Check the number of milligrams (mg) of sodium in one serving. Most people should limit total sodium intake to less than 2,300 mg per day.  Always check the nutrition information of foods labeled as "low-fat" or "nonfat". These foods may be higher in added sugar or refined carbohydrates and should be avoided.  Talk to your dietitian to identify your daily goals for nutrients listed on the label. Shopping  Avoid buying canned, premade, or processed foods. These foods tend to be high in fat, sodium, and added sugar.  Shop around the outside edge of the grocery store. This includes fresh fruits and vegetables, bulk grains, fresh meats, and fresh dairy. Cooking  Use low-heat cooking methods, such as baking, instead of high-heat cooking methods like deep frying.  Cook using healthy oils, such as olive, canola, or sunflower oil.  Avoid cooking with butter, cream, or high-fat meats. Meal planning  Eat meals and snacks regularly, preferably at the same times every day. Avoid going long periods of time without eating.  Eat foods high in fiber, such as fresh fruits, vegetables, beans, and whole grains. Talk to your dietitian about how many servings of carbohydrates you can eat at each meal.  Eat 4-6 ounces of lean protein each day, such as lean meat, chicken, fish, eggs, or tofu. 1 ounce is equal to 1 ounce of meat, chicken, or fish, 1 egg, or 1/4 cup of tofu.  Eat some foods each day that contain healthy fats, such as avocado, nuts, seeds, and fish. Lifestyle   Check your blood glucose regularly.  Exercise at least 30 minutes 5 or more days each week, or as told by your health care provider.  Take medicines as told  by your health care provider.  Do not use any products that contain nicotine or tobacco, such as cigarettes and e-cigarettes. If you need help quitting, ask your health care provider.  Work with a Veterinary surgeon or diabetes educator to identify strategies to manage stress and any emotional and social challenges. What are some questions to ask my health care provider?  Do I need to meet with a diabetes educator?  Do I need to meet with a dietitian?  What number can I call if I have questions?  When are the best times to check my blood glucose? Where to find more information:  American Diabetes Association: diabetes.org/food-and-fitness/food  Academy of Nutrition and Dietetics: https://www.vargas.com/  [employer] of Diabetes and Digestive and Kidney Diseases (NIH): FindJewelers.cz Summary  A healthy meal plan will help you control your blood glucose and maintain a healthy lifestyle.  Working with a diet and nutrition specialist (dietitian) can help you make a meal plan that is best for you.  Keep in mind that carbohydrates and alcohol have immediate  effects on your blood glucose levels. It is important to count carbohydrates and to use alcohol carefully. This information is not intended to replace advice given to you by your health care provider. Make sure you discuss any questions you have with your health care provider. Document Released: 12/25/2004 Document Revised: 05/04/2016 Document Reviewed: 05/04/2016 Elsevier Interactive Patient Education  [employer]2018 Elsevier Inc.

## 2017-05-26 LAB — COMPREHENSIVE METABOLIC PANEL
A/G RATIO: 1.7 (ref 1.2–2.2)
ALK PHOS: 93 IU/L (ref 39–117)
ALT: 23 IU/L (ref 0–32)
AST: 13 IU/L (ref 0–40)
Albumin: 4 g/dL (ref 3.5–5.5)
BUN/Creatinine Ratio: 19 (ref 9–23)
BUN: 14 mg/dL (ref 6–24)
Bilirubin Total: 0.4 mg/dL (ref 0.0–1.2)
CO2: 21 mmol/L (ref 20–29)
Calcium: 9.6 mg/dL (ref 8.7–10.2)
Chloride: 99 mmol/L (ref 96–106)
Creatinine, Ser: 0.73 mg/dL (ref 0.57–1.00)
GFR calc Af Amer: 107 mL/min/{1.73_m2} (ref >59)
GFR, EST NON AFRICAN AMERICAN: 93 mL/min/{1.73_m2} (ref >59)
GLOBULIN, TOTAL: 2.4 g/dL (ref 1.5–4.5)
Glucose: 462 mg/dL — ABNORMAL HIGH (ref 65–99)
POTASSIUM: 4.2 mmol/L (ref 3.5–5.2)
SODIUM: 137 mmol/L (ref 134–144)
Total Protein: 6.4 g/dL (ref 6.0–8.5)

## 2017-05-26 LAB — CBC WITH DIFFERENTIAL/PLATELET
BASOS: 1 %
Basophils Absolute: 0 10*3/uL (ref 0.0–0.2)
EOS (ABSOLUTE): 0.3 10*3/uL (ref 0.0–0.4)
Eos: 6 %
Hematocrit: 40.9 % (ref 34.0–46.6)
Hemoglobin: 13.4 g/dL (ref 11.1–15.9)
IMMATURE GRANULOCYTES: 0 %
Immature Grans (Abs): 0 10*3/uL (ref 0.0–0.1)
LYMPHS ABS: 1.4 10*3/uL (ref 0.7–3.1)
Lymphs: 26 %
MCH: 27.5 pg (ref 26.6–33.0)
MCHC: 32.8 g/dL (ref 31.5–35.7)
MCV: 84 fL (ref 79–97)
MONOS ABS: 0.2 10*3/uL (ref 0.1–0.9)
Monocytes: 4 %
NEUTROS PCT: 63 %
Neutrophils Absolute: 3.3 10*3/uL (ref 1.4–7.0)
PLATELETS: 230 10*3/uL (ref 150–379)
RBC: 4.87 x10E6/uL (ref 3.77–5.28)
RDW: 13.9 % (ref 12.3–15.4)
WBC: 5.3 10*3/uL (ref 3.4–10.8)

## 2017-05-26 LAB — THYROID PANEL WITH TSH
Free Thyroxine Index: 1.7 (ref 1.2–4.9)
T3 UPTAKE RATIO: 22 % — AB (ref 24–39)
T4, Total: 7.7 ug/dL (ref 4.5–12.0)
TSH: 1.79 u[IU]/mL (ref 0.450–4.500)

## 2017-06-03 ENCOUNTER — Telehealth: Payer: Self-pay | Admitting: Family Medicine

## 2017-06-03 NOTE — Telephone Encounter (Signed)
Office noted complete. Please forward note and referral to [facility] for evaluation and possible removal of umbilical hernia

## 2017-06-04 NOTE — Telephone Encounter (Signed)
Referral faxed

## 2017-06-08 ENCOUNTER — Ambulatory Visit: Payer: Self-pay | Admitting: Family Medicine

## 2017-06-08 VITALS — BP 140/76 | HR 80

## 2017-06-08 DIAGNOSIS — Z0130 Encounter for examination of blood pressure without abnormal findings: Secondary | ICD-10-CM

## 2017-06-08 NOTE — Progress Notes (Signed)
BP Check. 140/74. Patient took medication approximately 30 minutes prior to visit. She is scheduled for follow-up on 07/07/2014.

## 2017-07-06 ENCOUNTER — Encounter: Payer: Self-pay | Admitting: Family Medicine

## 2017-07-06 ENCOUNTER — Ambulatory Visit (INDEPENDENT_AMBULATORY_CARE_PROVIDER_SITE_OTHER): Payer: Self-pay | Admitting: Family Medicine

## 2017-07-06 VITALS — BP 142/86 | HR 88 | Temp 98.2°F | Ht 62.0 in | Wt 185.0 lb

## 2017-07-06 DIAGNOSIS — E118 Type 2 diabetes mellitus with unspecified complications: Secondary | ICD-10-CM

## 2017-07-06 DIAGNOSIS — R739 Hyperglycemia, unspecified: Secondary | ICD-10-CM

## 2017-07-06 DIAGNOSIS — I10 Essential (primary) hypertension: Secondary | ICD-10-CM

## 2017-07-06 DIAGNOSIS — Z794 Long term (current) use of insulin: Secondary | ICD-10-CM

## 2017-07-06 LAB — POCT URINALYSIS DIP (DEVICE)
Bilirubin Urine: NEGATIVE
Glucose, UA: 500 mg/dL — AB
KETONES UR: NEGATIVE mg/dL
Nitrite: NEGATIVE
PH: 5 (ref 5.0–8.0)
PROTEIN: NEGATIVE mg/dL
SPECIFIC GRAVITY, URINE: 1.015 (ref 1.005–1.030)
Urobilinogen, UA: 0.2 mg/dL (ref 0.0–1.0)

## 2017-07-06 LAB — POCT GLYCOSYLATED HEMOGLOBIN (HGB A1C): HEMOGLOBIN A1C: 12.3

## 2017-07-06 LAB — GLUCOSE, POCT (MANUAL RESULT ENTRY)
POC GLUCOSE: 360 mg/dL — AB (ref 70–99)
POC GLUCOSE: 396 mg/dL — AB (ref 70–99)

## 2017-07-06 MED ORDER — INSULIN GLARGINE 100 UNIT/ML ~~LOC~~ SOLN: 10.0000 [IU] | Freq: Every day | SUBCUTANEOUS | 11 refills | Status: DC

## 2017-07-06 MED ORDER — INSULIN PEN NEEDLE 29G X 12.7MM MISC: 2 refills | Status: DC

## 2017-07-06 MED ORDER — DICLOFENAC SODIUM 75 MG PO TBEC: 75.0000 mg | DELAYED_RELEASE_TABLET | Freq: Every day | ORAL | 1 refills | Status: DC

## 2017-07-06 MED ORDER — INJECTION DEVICE FOR INSULIN DEVI
Freq: Once | Status: AC
  Administered 2017-07-06: 09:00:00

## 2017-07-06 NOTE — Progress Notes (Signed)
[phone]

## 2017-07-06 NOTE — Patient Instructions (Addendum)
I am adding Lantus 10 units daily at at bedtime after meal. Continue all other medications as prescribed. Continue to check blood sugar at least twice daily. Notify office if readings are persistently greater than 200. This indicates insulin dose needs to be increased. I am sending you to diabetes education for further guidance on diet and improving glycemic control.   Insulin Glargine injection What is this medicine? INSULIN GLARGINE (IN su lin GLAR geen) is a human-made form of insulin. This drug lowers the amount of sugar in your blood. It is a long-acting insulin that is usually given once a day. This medicine may be used for other purposes; ask your health care provider or pharmacist if you have questions. COMMON BRAND NAME(S): BASAGLAR, Lantus, Lantus SoloStar, Toujeo SoloStar What should I tell my health care provider before I take this medicine? They need to know if you have any of these conditions: -episodes of low blood sugar -kidney disease -liver disease -an unusual or allergic reaction to insulin, metacresol, other medicines, foods, dyes, or preservatives -pregnant or trying to get pregnant -breast-feeding How should I use this medicine? This medicine is for injection under the skin. Use this medicine at the same time each day. Use exactly as directed. This insulin should never be mixed in the same syringe with other insulins before injection. Do not vigorously shake before use. You will be taught how to use this medicine and how to adjust doses for activities and illness. Do not use more insulin than prescribed. Always check the appearance of your insulin before using it. This medicine should be clear and colorless like water. Do not use it if it is cloudy, thickened, colored, or has solid particles in it. It is important that you put your used needles and syringes in a special sharps container. Do not put them in a trash can. If you do not have a sharps container, call your  pharmacist or healthcare provider to get one. Talk to your pediatrician regarding the use of this medicine in children. Special care may be needed. Overdosage: If you think you have taken too much of this medicine contact a poison control center or emergency room at once. NOTE: This medicine is only for you. Do not share this medicine with others. What if I miss a dose? It is important not to miss a dose. Your health care professional or doctor should discuss a plan for missed doses with you. If you do miss a dose, follow their plan. Do not take double doses. What may interact with this medicine? -other medicines for diabetes Many medications may cause changes in blood sugar, these include: -alcohol containing beverages -antiviral medicines for HIV or AIDS -aspirin and aspirin-like drugs -certain medicines for blood pressure, heart disease, irregular heart beat -chromium -diuretics -female hormones, such as estrogens or progestins, birth control pills -fenofibrate -gemfibrozil -isoniazid -lanreotide -female hormones or anabolic steroids -MAOIs like Carbex, Eldepryl, Marplan, Nardil, and Parnate -medicines for weight loss -medicines for allergies, asthma, cold, or cough -medicines for depression, anxiety, or psychotic disturbances -niacin -nicotine -NSAIDs, medicines for pain and inflammation, like ibuprofen or naproxen -octreotide -pasireotide -pentamidine -phenytoin -probenecid -quinolone antibiotics such as ciprofloxacin, levofloxacin, ofloxacin -some herbal dietary supplements -steroid medicines such as prednisone or cortisone -sulfamethoxazole; trimethoprim -thyroid hormones Some medications can hide the warning symptoms of low blood sugar (hypoglycemia). You may need to monitor your blood sugar more closely if you are taking one of these medications. These include: -beta-blockers, often used for high blood pressure  or heart problems (examples include atenolol, metoprolol,  propranolol) -clonidine -guanethidine -reserpine This list may not describe all possible interactions. Give your health care provider a list of all the medicines, herbs, non-prescription drugs, or dietary supplements you use. Also tell them if you smoke, drink alcohol, or use illegal drugs. Some items may interact with your medicine. What should I watch for while using this medicine? Visit your health care professional or doctor for regular checks on your progress. Do not drive, use machinery, or do anything that needs mental alertness until you know how this medicine affects you. Alcohol may interfere with the effect of this medicine. Avoid alcoholic drinks. A test called the HbA1C (A1C) will be monitored. This is a simple blood test. It measures your blood sugar control over the last 2 to 3 months. You will receive this test every 3 to 6 months. Learn how to check your blood sugar. Learn the symptoms of low and high blood sugar and how to manage them. Always carry a quick-source of sugar with you in case you have symptoms of low blood sugar. Examples include hard sugar candy or glucose tablets. Make sure others know that you can choke if you eat or drink when you develop serious symptoms of low blood sugar, such as seizures or unconsciousness. They must get medical help at once. Tell your doctor or health care professional if you have high blood sugar. You might need to change the dose of your medicine. If you are sick or exercising more than usual, you might need to change the dose of your medicine. Do not skip meals. Ask your doctor or health care professional if you should avoid alcohol. Many nonprescription cough and cold products contain sugar or alcohol. These can affect blood sugar. Make sure that you have the right kind of syringe for the type of insulin you use. Try not to change the brand and type of insulin or syringe unless your health care professional or doctor tells you to. Switching  insulin brand or type can cause dangerously high or low blood sugar. Always keep an extra supply of insulin, syringes, and needles on hand. Use a syringe one time only. Throw away syringe and needle in a closed container to prevent accidental needle sticks. Insulin pens and cartridges should never be shared. Even if the needle is changed, sharing may result in passing of viruses like hepatitis or HIV. Wear a medical ID bracelet or chain, and carry a card that describes your disease and details of your medicine and dosage times. What side effects may I notice from receiving this medicine? Side effects that you should report to your doctor or health care professional as soon as possible: -allergic reactions like skin rash, itching or hives, swelling of the face, lips, or tongue -breathing problems -signs and symptoms of high blood sugar such as dizziness, dry mouth, dry skin, fruity breath, nausea, stomach pain, increased hunger or thirst, increased urination -signs and symptoms of low blood sugar such as feeling anxious, confusion, dizziness, increased hunger, unusually weak or tired, sweating, shakiness, cold, irritable, headache, blurred vision, fast heartbeat, loss of consciousness Side effects that usually do not require medical attention (report to your doctor or health care professional if they continue or are bothersome): -increase or decrease in fatty tissue under the skin due to overuse of a particular injection site -itching, burning, swelling, or rash at site where injected This list may not describe all possible side effects. Call your doctor for medical advice  about side effects. You may report side effects to FDA at 1-800-FDA-1088. Where should I keep my medicine? Keep out of the reach of children. Store unopened vials in a refrigerator between 2 and 8 degrees C (36 and 46 degrees F). Do not freeze or use if the insulin has been frozen. Opened vials (vials currently in use) may be stored  in the refrigerator or at room temperature, at approximately 25 degrees C (77 degrees F) or cooler. Keeping your insulin at room temperature decreases the amount of pain during injection. Once opened, your insulin can be used for 28 days. After 28 days, the vial should be thrown away. Store Lantus Dietitianolostar Pens or Basaglar KwikPens in a refrigerator between 2 and 8 degrees C (36 and 46 degrees F) or at room temperature below 30 degrees C (86 degrees F). Do not freeze or use if the insulin has been frozen. Once opened, the pens should be kept at room temperature. Do not store in the refrigerator once opened. Once opened, the insulin can be used for 28 days. After 28 days, the Lantus Solostar Pen or Basaglar KwikPen should be thrown away. Store [employer] Solostar Pens in a refrigerator between 2 and 8 degrees C (36 and 46 degrees F). Do not freeze or use if the insulin has been frozen. Once opened, the pens should be kept at room temperature below 30 degrees C (86 degrees F). Do not store in the refrigerator once opened. Once opened, the insulin can be used for 42 days. After 42 days, the [employer] Solostar Pen should be thrown away. Protect from light and excessive heat. Throw away any unused medicine after the expiration date or after the specified time for room temperature storage has passed. NOTE: This sheet is a summary. It may not cover all possible information. If you have questions about this medicine, talk to your doctor, pharmacist, or health care provider.  2018 Elsevier/Gold Standard (2016-04-15 10:26:25)    Insulin Injection Instructions, Single Insulin Dose, Adult A subcutaneous injection is a shot of medicine that is injected into the layer of fat between skin and muscle. People with type 1 diabetes must take insulin because their bodies do not make it. People with type 2 diabetes may need to take insulin. There are many different types of insulin. The type of insulin that you take may determine  how many injections you give yourself and when you need to take the injections. Choosing a site for injection Insulin absorption varies from site to site. It is best to inject insulin within the same body area, using a different spot in that area for each injection. Do not inject the insulin in the same spot for each injection. There are five main areas that can be used for injecting. These areas include:  Abdomen. This is the preferred area.  Front of thigh.  Upper, outer side of thigh.  Back of upper arm.  Buttocks.  Using a syringe and vial: single insulin dose First, follow the steps for Getting Ready, then continue with the steps for Pushing Air Into the CanalouVial, then follow the steps for Filling the Syringe, and finish with the steps for Injecting the Insulin. Getting Ready  1. Wash your hands with soap and water. If soap and water are not available, use hand sanitizer. 2. Check the expiration date and type of insulin. 3. If you are using CLEAR insulin, check to see that it is clear and free of clumps. 4. Gently roll the medicine bottle (  vial) between your palms to warm it. Do not shake the vial. 5. Remove the plastic pop-top covering from the vial, if one is present. 6. Use an alcohol wipe to clean the rubber top of the vial. 7. Remove the plastic cover from the needle on the syringe. Do not let the needle touch anything. Pushing Air Into the [place]  1. Pull the plunger back to bring (draw up) air into the syringe. The amount of air should be the same as the number of units in a dose of single insulin. 2. While you keep the vial right-side up, poke the needle through the rubber top of the vial. Do not turn the vial upside down to do this. 3. Push the plunger in all the way. This pushes air into the vial. 4. Do not take the needle out of the vial yet. Filling the Syringe 1. With the needle still inserted in the vial, turn the vial upside down and hold it at eye level. 2. Slowly pull  back on the plunger to draw up the desired number of insulin units into the syringe. 3. If you see air bubbles in the syringe, slowly move the plunger up and down 2 or 3 times to get rid of them. 4. Pull back the plunger until the syringe is filled to the correct dose. 5. Remove the needle from the vial. Do not let the needle touch anything. Injecting the Insulin  1. Use an alcohol wipe to clean the site where you will be injecting the needle. Let the site air-dry. 2. Hold the syringe in your writing hand like a pencil. 3. Use your other hand to pinch and hold about an inch of skin. Do not directly touch the cleaned part of the skin. 4. Gently but quickly, put the needle straight into the skin. The needle should be at a 90-degree angle (perpendicular) to the skin, as if to form the letter "L." ? For example, if you are giving an injection in the abdomen, the abdomen forms one "leg" of the "L" and the needle forms the other "leg" of the "L." 5. For adults who have a small amount of body fat, the needle may need to be injected at a 45-degree angle instead. Your health care provider will tell you if this is necessary. ? A 45-degree angle looks like the letter "V." 6. Push the needle in as far as it will go (to the hub). 7. When the needle is completely inserted into the skin, use the thumb of your writing hand to push the plunger down all the way to inject the insulin. 8. Let go of the skin that you are pinching. Continue to hold the syringe in place with your writing hand. 9. Wait 5 seconds, then pull the needle straight out of the skin. 10. Press and hold the alcohol wipe over the injection site until any bleeding stops. Do not rub the area. 11. Do not put the plastic cover back on the needle. 12. Discard the syringe and needle directly into a sharps container, such as an empty plastic bottle with a cover. Throwing away supplies   Discard all used needles in a puncture-proof sharps disposal  container. You can ask your local pharmacy about where you can get this kind of disposal container, or you can use an empty liquid laundry detergent bottle that has a cover.  Follow the disposal regulations for the area where you live. Do not use any syringe or needle more than one  time.  Throw away empty vials in the regular trash. What questions should I ask my health care provider?  How often should I be taking insulin?  How often should I check my blood glucose?  What amount of insulin should I be taking at each time?  What are the side effects?  What should I do if my blood glucose is too high?  What should I do if my blood glucose is too low?  What should I do if I forget to take my insulin?  What number should I call if I have questions? Where can I get more information?  American Diabetes Association (ADA): www.diabetes.org  American Association of Diabetes Educators (AADE) Patient Resources: https://www.diabeteseducator.org/patient-resources This information is not intended to replace advice given to you by your health care provider. Make sure you discuss any questions you have with your health care provider. Document Released: 05/03/2015 Document Revised: 09/05/2015 Document Reviewed: 05/03/2015 Elsevier Interactive Patient Education  2018 [employer].  Diabetes Mellitus and Nutrition When you have diabetes (diabetes mellitus), it is very important to have healthy eating habits because your blood sugar (glucose) levels are greatly affected by what you eat and drink. Eating healthy foods in the appropriate amounts, at about the same times every day, can help you:  Control your blood glucose.  Lower your risk of heart disease.  Improve your blood pressure.  Reach or maintain a healthy weight.  Every person with diabetes is different, and each person has different needs for a meal plan. Your health care provider may recommend that you work with a diet and nutrition  specialist (dietitian) to make a meal plan that is best for you. Your meal plan may vary depending on factors such as:  The calories you need.  The medicines you take.  Your weight.  Your blood glucose, blood pressure, and cholesterol levels.  Your activity level.  Other health conditions you have, such as heart or kidney disease.  How do carbohydrates affect me? Carbohydrates affect your blood glucose level more than any other type of food. Eating carbohydrates naturally increases the amount of glucose in your blood. Carbohydrate counting is a method for keeping track of how many carbohydrates you eat. Counting carbohydrates is important to keep your blood glucose at a healthy level, especially if you use insulin or take certain oral diabetes medicines. It is important to know how many carbohydrates you can safely have in each meal. This is different for every person. Your dietitian can help you calculate how many carbohydrates you should have at each meal and for snack. Foods that contain carbohydrates include:  Bread, cereal, rice, pasta, and crackers.  Potatoes and corn.  Peas, beans, and lentils.  Milk and yogurt.  Fruit and juice.  Desserts, such as cakes, cookies, ice cream, and candy.  How does alcohol affect me? Alcohol can cause a sudden decrease in blood glucose (hypoglycemia), especially if you use insulin or take certain oral diabetes medicines. Hypoglycemia can be a life-threatening condition. Symptoms of hypoglycemia (sleepiness, dizziness, and confusion) are similar to symptoms of having too much alcohol. If your health care provider says that alcohol is safe for you, follow these guidelines:  Limit alcohol intake to no more than 1 drink per day for nonpregnant women and 2 drinks per day for men. One drink equals 12 oz of beer, 5 oz of wine, or 1 oz of hard liquor.  Do not drink on an empty stomach.  Keep yourself hydrated with water, diet  soda, or unsweetened  iced tea.  Keep in mind that regular soda, juice, and other mixers may contain a lot of sugar and must be counted as carbohydrates.  What are tips for following this plan? Reading food labels  Start by checking the serving size on the label. The amount of calories, carbohydrates, fats, and other nutrients listed on the label are based on one serving of the food. Many foods contain more than one serving per package.  Check the total grams (g) of carbohydrates in one serving. You can calculate the number of servings of carbohydrates in one serving by dividing the total carbohydrates by 15. For example, if a food has 30 g of total carbohydrates, it would be equal to 2 servings of carbohydrates.  Check the number of grams (g) of saturated and trans fats in one serving. Choose foods that have low or no amount of these fats.  Check the number of milligrams (mg) of sodium in one serving. Most people should limit total sodium intake to less than 2,300 mg per day.  Always check the nutrition information of foods labeled as "low-fat" or "nonfat". These foods may be higher in added sugar or refined carbohydrates and should be avoided.  Talk to your dietitian to identify your daily goals for nutrients listed on the label. Shopping  Avoid buying canned, premade, or processed foods. These foods tend to be high in fat, sodium, and added sugar.  Shop around the outside edge of the grocery store. This includes fresh fruits and vegetables, bulk grains, fresh meats, and fresh dairy. Cooking  Use low-heat cooking methods, such as baking, instead of high-heat cooking methods like deep frying.  Cook using healthy oils, such as olive, canola, or sunflower oil.  Avoid cooking with butter, cream, or high-fat meats. Meal planning  Eat meals and snacks regularly, preferably at the same times every day. Avoid going long periods of time without eating.  Eat foods high in fiber, such as fresh fruits, vegetables,  beans, and whole grains. Talk to your dietitian about how many servings of carbohydrates you can eat at each meal.  Eat 4-6 ounces of lean protein each day, such as lean meat, chicken, fish, eggs, or tofu. 1 ounce is equal to 1 ounce of meat, chicken, or fish, 1 egg, or 1/4 cup of tofu.  Eat some foods each day that contain healthy fats, such as avocado, nuts, seeds, and fish. Lifestyle   Check your blood glucose regularly.  Exercise at least 30 minutes 5 or more days each week, or as told by your health care provider.  Take medicines as told by your health care provider.  Do not use any products that contain nicotine or tobacco, such as cigarettes and e-cigarettes. If you need help quitting, ask your health care provider.  Work with a Veterinary surgeon or diabetes educator to identify strategies to manage stress and any emotional and social challenges. What are some questions to ask my health care provider?  Do I need to meet with a diabetes educator?  Do I need to meet with a dietitian?  What number can I call if I have questions?  When are the best times to check my blood glucose? Where to find more information:  American Diabetes Association: diabetes.org/food-and-fitness/food  Academy of Nutrition and Dietetics: https://www.vargas.com/  [employer] of Diabetes and Digestive and Kidney Diseases (NIH): FindJewelers.cz Summary  A healthy meal plan will help you control your blood glucose and maintain a healthy lifestyle.  Working  with a diet and nutrition specialist (dietitian) can help you make a meal plan that is best for you.  Keep in mind that carbohydrates and alcohol have immediate effects on your blood glucose levels. It is important to count carbohydrates and to use alcohol carefully. This information is not intended to replace advice given to you by your  health care provider. Make sure you discuss any questions you have with your health care provider. Document Released: 12/25/2004 Document Revised: 05/04/2016 Document Reviewed: 05/04/2016 Elsevier Interactive Patient Education  [employer].

## 2017-07-06 NOTE — Progress Notes (Signed)
Patient ID: Wanda Weaver, female    DOB: November 13, 1960, 57 y.o.   MRN: [id number]  PCP: Bing Neighbors, FNP  Chief Complaint  Patient presents with  . Follow-up    6 weeks on chronic condition    Subjective:  HPI Wanda Weaver is a 57 y.o. female type 2 diabetes, hypertension presents for six week diabetes follow-up. Reports checking blood sugars at home and lowest reading 173 and highest reading 473. She reports compliance with current medication regimen. She complains of associated dizziness, urinary frequency, and continues to experience visual disturbances when blood sugars are elevated. She endorses poor eating choices. Last A1C 11.6. Suffers from hypertension and does not monitor blood pressure at home. She is inactive of physical activity. Current Body mass index is 33.84 kg/m. Denies chest pain, headache, dizziness, weakness, or shortness of breath. Social History   Socioeconomic History  . Marital status: Single    Spouse name: Not on file  . Number of children: Not on file  . Years of education: Not on file  . Highest education level: Not on file  Occupational History  . Not on file  Social Needs  . Financial resource strain: Not on file  . Food insecurity:    Worry: Not on file    Inability: Not on file  . Transportation needs:    Medical: Not on file    Non-medical: Not on file  Tobacco Use  . Smoking status: Never Smoker  . Smokeless tobacco: Never Used  Substance and Sexual Activity  . Alcohol use: No  . Drug use: No  . Sexual activity: Not on file  Lifestyle  . Physical activity:    Days per week: Not on file    Minutes per session: Not on file  . Stress: Not on file  Relationships  . Social connections:    Talks on phone: Not on file    Gets together: Not on file    Attends religious service: Not on file    Active member of club or organization: Not on file    Attends meetings of clubs or organizations: Not on file    Relationship status: Not on file  .  Intimate partner violence:    Fear of current or ex partner: Not on file    Emotionally abused: Not on file    Physically abused: Not on file    Forced sexual activity: Not on file  Other Topics Concern  . Not on file  Social History Narrative  . Not on file    Family History  Problem Relation Age of Onset  . Diabetes Mother   . Dementia Mother   . Diabetes Father   . Hypertension Father    Review of Systems Pertinent negatives listed in HPI   No Known Allergies  Prior to Admission medications   Medication Sig Start Date End Date Taking? Authorizing Provider  glipiZIDE (GLUCOTROL) 10 MG tablet Take 1 tablet (10 mg total) by mouth 2 (two) times daily before a meal. 05/25/17  Yes Bing Neighbors, FNP  lisinopril (PRINIVIL,ZESTRIL) 20 MG tablet Take 1 tablet (20 mg total) by mouth daily. 05/25/17  Yes Bing Neighbors, FNP  metFORMIN (GLUCOPHAGE) 500 MG tablet Take 2 tablets (1,000 mg total) by mouth 2 (two) times daily with a meal. 05/25/17  Yes Bing Neighbors, FNP  diclofenac (VOLTAREN) 75 MG EC tablet Take 1 tablet (75 mg total) by mouth 2 (two) times daily. Patient not taking: Reported on 07/06/2017 05/25/17  Bing NeighborsHarris, Tory Septer S, FNP   Past Medical, Surgical Family and Social History reviewed and updated.   Objective:   Today's Vitals   07/06/17 0803  BP: (!) 142/86  Pulse: 88  Temp: 98.2 F (36.8 C)  TempSrc: Oral  SpO2: 100%  Weight: 185 lb (83.9 kg)  Height: 5\' 2"  (1.575 m)    Wt Readings from Last 3 Encounters:  07/06/17 185 lb (83.9 kg)  05/25/17 186 lb (84.4 kg)  05/03/16 184 lb (83.5 kg)    Physical Exam Constitutional: Patient appears well-developed and well-nourished. No distress. HENT: Normocephalic, atraumatic, External right and left ear normal. Eyes: Conjunctivae and EOM are normal. PERRLA, no scleral icterus. Neck: Normal ROM. Neck supple. No JVD. No tracheal deviation. No thyromegaly. CVS: RRR, S1/S2 +, no murmurs, no gallops, no carotid  bruit.  Pulmonary: Effort and breath sounds normal, no stridor, rhonchi, wheezes, rales.  Abdominal: Soft. BS +, no distension, tenderness, rebound or guarding.  Musculoskeletal: Normal range of motion. No edema and no tenderness.  Neuro: Alert. Normal reflexes, muscle tone coordination. No cranial nerve deficit. Skin: Skin is warm and dry. No rash noted. Not diaphoretic. No erythema. No pallor. Psychiatric: Normal mood and affect. Behavior, judgment, thought content normal.   Assessment & Plan:  1. Type 2 diabetes mellitus with complication, with long-term current use of insulin (HCC), A1C 12.3, increased from prior A1C of 11.6. Currently uncontrolled with current regimen. Continue metformin. Adding Lantus 10 units at bedtime. Glipizide 10 mg twice daily with meals. Referring patient to diabetes education for assistance with nutrition and improved management of diabetes. Strongly encouraged physical activity as tolerated with a goal of 150 minutes per week.  Ambulatory referral to diabetic education. Goal A1C 6 week follow-up <10.0.  2. Hyperglycemia, Glucose on arrival 396. Administer 10 units of Humalog. Increase fluid intake with water. Take oral diabetes medication and start Lantus tonight. Notify office of any blood sugar readings consistently greater than 250.  3. Essential hypertension, slightly elevated. No medication taken today. We have discussed target BP range and blood pressure goal. I have advised patient to check BP regularly and to call us back or report to clinic if the numbers are consistently higher than 140/90. We discussed the importance of compliance with medical therapy and DASH diet recommended, consequences of uncontrolled hypertension discussed. Continue current BP medications and take consistently.   RTC: 6 weeks for diabetes follow-up   Godfrey PickKimberly S. Tiburcio PeaHarris, MSN, FNP-C [facility]  [address] [place], [place]  [id number] [phone]

## 2017-07-20 ENCOUNTER — Emergency Department (HOSPITAL_COMMUNITY)
Admission: EM | Admit: 2017-07-20 | Discharge: 2017-07-21 | Disposition: A | Discharge status: Home / Self Care | Payer: Self-pay | Attending: Emergency Medicine | Admitting: Emergency Medicine

## 2017-07-20 DIAGNOSIS — T310 Burns involving less than 10% of body surface: Secondary | ICD-10-CM | POA: Insufficient documentation

## 2017-07-20 DIAGNOSIS — Y9389 Activity, other specified: Secondary | ICD-10-CM | POA: Insufficient documentation

## 2017-07-20 DIAGNOSIS — Z794 Long term (current) use of insulin: Secondary | ICD-10-CM | POA: Insufficient documentation

## 2017-07-20 DIAGNOSIS — T23261A Burn of second degree of back of right hand, initial encounter: Secondary | ICD-10-CM | POA: Insufficient documentation

## 2017-07-20 DIAGNOSIS — E119 Type 2 diabetes mellitus without complications: Secondary | ICD-10-CM | POA: Insufficient documentation

## 2017-07-20 DIAGNOSIS — Z79899 Other long term (current) drug therapy: Secondary | ICD-10-CM | POA: Insufficient documentation

## 2017-07-20 DIAGNOSIS — X101XXA Contact with hot food, initial encounter: Secondary | ICD-10-CM | POA: Insufficient documentation

## 2017-07-20 DIAGNOSIS — Y929 Unspecified place or not applicable: Secondary | ICD-10-CM | POA: Insufficient documentation

## 2017-07-20 DIAGNOSIS — I10 Essential (primary) hypertension: Secondary | ICD-10-CM | POA: Insufficient documentation

## 2017-07-20 DIAGNOSIS — Y999 Unspecified external cause status: Secondary | ICD-10-CM | POA: Insufficient documentation

## 2017-07-21 ENCOUNTER — Encounter (HOSPITAL_COMMUNITY): Payer: Self-pay | Admitting: Emergency Medicine

## 2017-07-21 ENCOUNTER — Other Ambulatory Visit: Payer: Self-pay

## 2017-07-21 MED ORDER — KETOROLAC TROMETHAMINE 30 MG/ML IJ SOLN
30.0000 mg | Freq: Once | INTRAMUSCULAR | Status: AC
  Administered 2017-07-21: 30 mg via INTRAMUSCULAR
  Filled 2017-07-21: qty 1

## 2017-07-21 MED ORDER — HYDROCODONE-ACETAMINOPHEN 5-325 MG PO TABS
1.0000 | ORAL_TABLET | Freq: Once | ORAL | Status: DC
  Filled 2017-07-21: qty 1

## 2017-07-21 MED ORDER — BACITRACIN ZINC 500 UNIT/GM EX OINT
TOPICAL_OINTMENT | Freq: Two times a day (BID) | CUTANEOUS | Status: DC
  Administered 2017-07-21: 05:00:00 via TOPICAL
  Filled 2017-07-21: qty 0.9

## 2017-07-21 MED ORDER — HYDROCODONE-ACETAMINOPHEN 5-325 MG PO TABS: 1.0000 | ORAL_TABLET | Freq: Four times a day (QID) | ORAL | 0 refills | Status: DC | PRN

## 2017-07-21 MED ORDER — IBUPROFEN 600 MG PO TABS: 600.0000 mg | ORAL_TABLET | Freq: Four times a day (QID) | ORAL | 0 refills | Status: DC | PRN
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
## 2017-07-21 NOTE — ED Notes (Signed)
Patient left at this time with all belongings. 
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
## 2017-07-28 MED FILL — LISINOPRIL 20 MG TAB: 20 | 30 days supply | Qty: 30 | Fill #1

## 2017-08-06 ENCOUNTER — Ambulatory Visit (INDEPENDENT_AMBULATORY_CARE_PROVIDER_SITE_OTHER): Payer: Self-pay | Admitting: Family Medicine

## 2017-08-06 ENCOUNTER — Encounter: Payer: Self-pay | Admitting: Family Medicine

## 2017-08-06 VITALS — BP 140/82 | HR 84 | Temp 98.0°F | Ht 62.0 in | Wt 187.0 lb

## 2017-08-06 DIAGNOSIS — Z114 Encounter for screening for human immunodeficiency virus [HIV]: Secondary | ICD-10-CM

## 2017-08-06 DIAGNOSIS — E118 Type 2 diabetes mellitus with unspecified complications: Secondary | ICD-10-CM

## 2017-08-06 DIAGNOSIS — E669 Obesity, unspecified: Secondary | ICD-10-CM

## 2017-08-06 DIAGNOSIS — Z1159 Encounter for screening for other viral diseases: Secondary | ICD-10-CM

## 2017-08-06 DIAGNOSIS — T23061A Burn of unspecified degree of back of right hand, initial encounter: Secondary | ICD-10-CM | POA: Insufficient documentation

## 2017-08-06 DIAGNOSIS — Z23 Encounter for immunization: Secondary | ICD-10-CM

## 2017-08-06 DIAGNOSIS — Z1231 Encounter for screening mammogram for malignant neoplasm of breast: Secondary | ICD-10-CM

## 2017-08-06 DIAGNOSIS — Z794 Long term (current) use of insulin: Secondary | ICD-10-CM

## 2017-08-06 DIAGNOSIS — T23061S Burn of unspecified degree of back of right hand, sequela: Secondary | ICD-10-CM

## 2017-08-06 DIAGNOSIS — Z1239 Encounter for other screening for malignant neoplasm of breast: Secondary | ICD-10-CM

## 2017-08-06 DIAGNOSIS — R82998 Other abnormal findings in urine: Secondary | ICD-10-CM

## 2017-08-06 LAB — POCT URINALYSIS DIP (MANUAL ENTRY)
BILIRUBIN UA: NEGATIVE
BILIRUBIN UA: NEGATIVE mg/dL
Glucose, UA: NEGATIVE mg/dL
Nitrite, UA: NEGATIVE
Protein Ur, POC: NEGATIVE mg/dL
Urobilinogen, UA: 0.2 E.U./dL
pH, UA: 5.5 (ref 5.0–8.0)

## 2017-08-06 LAB — POCT GLYCOSYLATED HEMOGLOBIN (HGB A1C): Hemoglobin A1C: 10.6

## 2017-08-06 MED ORDER — SILVER SULFADIAZINE 1 % EX CREA: 1.0000 "application " | TOPICAL_CREAM | Freq: Every day | CUTANEOUS | 0 refills | Status: DC

## 2017-08-06 MED ORDER — INSULIN GLARGINE 100 UNIT/ML ~~LOC~~ SOLN
20.0000 [IU] | Freq: Every day | SUBCUTANEOUS | 11 refills | Status: DC
Start: 2017-08-06 — End: 2017-09-17

## 2017-08-06 MED FILL — SSD 1% CREAM: 1 | 30 days supply | Qty: 50 | Fill #0

## 2017-08-06 NOTE — Patient Instructions (Signed)
We will follow-up with any abnormal laboratory results. Your hemoglobin A1c has improved to 10.6, which remains above goal.  Will increase Lantus to 20 units at bedtime.  Continue to eat 5-6 small meals throughout the day.  Also, increase water intake to 3-4 bottles per day or more.  Also recommend 150 minutes of cardiovascular activity throughout the week. Your receive pneumococcal 23 vaccination today. We will follow-up in 6 weeks for Pap smear I have placed an order for a routine screening mammogram, please call [facility] to schedule mammogram.
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
## 2017-08-07 LAB — MICROALBUMIN / CREATININE URINE RATIO
CREATININE, UR: 27.6 mg/dL
MICROALB/CREAT RATIO: 62.3 mg/g{creat} — AB (ref 0.0–30.0)
MICROALBUM., U, RANDOM: 17.2 ug/mL

## 2017-08-07 LAB — HEPATITIS C ANTIBODY

## 2017-08-07 LAB — HIV ANTIBODY (ROUTINE TESTING W REFLEX): HIV SCREEN 4TH GENERATION: NONREACTIVE

## 2017-08-09 ENCOUNTER — Telehealth: Payer: Self-pay

## 2017-08-09 NOTE — Telephone Encounter (Signed)
Called and spoke with patient advised that all labs were normal and no medication changes are needed at this time. Thanks!

## 2017-08-09 NOTE — Telephone Encounter (Signed)
-----   Message from Massie Maroon, [place] sent at 08/07/2017  5:44 PM EDT ----- Regarding: lab results Please inform patient that screening labs are within a normal range. No further medication changes warranted at this time. Follow up in office as scheduled.   Nolon Nations  MSN, FNP-C Patient Care [facility] [address] [place], [place] [address] [phone]

## 2017-08-10 LAB — URINE CULTURE

## 2017-08-11 ENCOUNTER — Other Ambulatory Visit: Payer: Self-pay | Admitting: Family Medicine

## 2017-08-11 DIAGNOSIS — N3001 Acute cystitis with hematuria: Secondary | ICD-10-CM

## 2017-08-11 MED ORDER — NITROFURANTOIN MONOHYD MACRO 100 MG PO CAPS: 100.0000 mg | ORAL_CAPSULE | Freq: Two times a day (BID) | ORAL | 0 refills | Status: AC

## 2017-08-11 NOTE — Progress Notes (Signed)
Meds ordered this encounter  Medications  . nitrofurantoin, macrocrystal-monohydrate, (MACROBID) 100 MG capsule    Sig: Take 1 capsule (100 mg total) by mouth 2 (two) times daily for 7 days.    Dispense:  14 capsule    Refill:  0    Lachina Moore Hollis  MSN, FNP-C [facility] [address]  [place], [place] [address] [phone]  

## 2017-08-12 ENCOUNTER — Telehealth: Payer: Self-pay

## 2017-08-12 NOTE — Telephone Encounter (Signed)
Called, no answer. Left a message for patient to call back.  

## 2017-08-12 NOTE — Telephone Encounter (Signed)
-----   Message from Massie Maroon, [place] sent at 08/11/2017  7:20 PM EDT ----- Regarding: lab results Please inform patient that urine culture is consistent with a urinary tract infection. Will start Macrobid 100 mg twice daily for 7 days. Recommend that patient increase water intake, wipe from front to back, and practice good perineal hygiene.  Nolon Nations  MSN, FNP-C Patient Care [facility] [address] [place], [place] [address] [phone]

## 2017-08-23 ENCOUNTER — Other Ambulatory Visit: Payer: Self-pay

## 2017-08-23 MED ORDER — LANCETS 30G MISC
1.0000 | 12 refills | Status: DC
Start: 2017-08-23 — End: 2021-10-28

## 2017-08-23 MED ORDER — BLOOD GLUCOSE MONITOR KIT: PACK | 12 refills | Status: DC

## 2017-08-23 MED ORDER — GLUCOSE BLOOD VI STRP: ORAL_STRIP | 12 refills | Status: DC

## 2017-08-23 MED FILL — $LANTUS SOLOSTAR 100 UNITS/: 100 | 28 days supply | Qty: 3 | Fill #0

## 2017-08-23 MED FILL — TRUE METRIX TEST STRIP: 30 days supply | Qty: 100 | Fill #0

## 2017-08-23 MED FILL — TRUEplus LANCETS 28G MISC: 30 days supply | Qty: 100 | Fill #0

## 2017-08-23 MED FILL — !TRUE METRIX BLOOD GLUCOSE: 365 days supply | Qty: 1 | Fill #0

## 2017-08-23 MED FILL — metFORMIN HCL 500 MG TABS: 500 | 30 days supply | Qty: 120 | Fill #1

## 2017-08-24 ENCOUNTER — Ambulatory Visit: Payer: Self-pay | Admitting: Skilled Nursing Facility1

## 2017-08-24 ENCOUNTER — Encounter: Payer: Self-pay | Admitting: Family Medicine

## 2017-08-24 MED FILL — TRUEPLUS PEN NDL 31G X 1/4": 31G X 6 MM | 30 days supply | Qty: 100 | Fill #0

## 2017-08-24 MED FILL — TRUEPLUS PEN NDL 31G X 1/4: 31G X 6 MM | 30 days supply | Qty: 100 | Fill #0

## 2017-09-17 ENCOUNTER — Ambulatory Visit (INDEPENDENT_AMBULATORY_CARE_PROVIDER_SITE_OTHER): Payer: Self-pay | Admitting: Family Medicine

## 2017-09-17 ENCOUNTER — Other Ambulatory Visit: Payer: Self-pay

## 2017-09-17 ENCOUNTER — Encounter: Payer: Self-pay | Admitting: Family Medicine

## 2017-09-17 VITALS — BP 165/78 | HR 88 | Temp 98.2°F | Ht 62.0 in | Wt 185.0 lb

## 2017-09-17 DIAGNOSIS — R319 Hematuria, unspecified: Secondary | ICD-10-CM

## 2017-09-17 DIAGNOSIS — N390 Urinary tract infection, site not specified: Secondary | ICD-10-CM

## 2017-09-17 DIAGNOSIS — E118 Type 2 diabetes mellitus with unspecified complications: Secondary | ICD-10-CM

## 2017-09-17 DIAGNOSIS — Z794 Long term (current) use of insulin: Secondary | ICD-10-CM

## 2017-09-17 DIAGNOSIS — Z09 Encounter for follow-up examination after completed treatment for conditions other than malignant neoplasm: Secondary | ICD-10-CM

## 2017-09-17 DIAGNOSIS — Z124 Encounter for screening for malignant neoplasm of cervix: Secondary | ICD-10-CM

## 2017-09-17 DIAGNOSIS — N939 Abnormal uterine and vaginal bleeding, unspecified: Secondary | ICD-10-CM

## 2017-09-17 DIAGNOSIS — R8290 Unspecified abnormal findings in urine: Secondary | ICD-10-CM

## 2017-09-17 DIAGNOSIS — I10 Essential (primary) hypertension: Secondary | ICD-10-CM

## 2017-09-17 DIAGNOSIS — R739 Hyperglycemia, unspecified: Secondary | ICD-10-CM

## 2017-09-17 LAB — POCT GLYCOSYLATED HEMOGLOBIN (HGB A1C): Hemoglobin A1C: 9.6 % — AB (ref 4.0–5.6)

## 2017-09-17 LAB — POCT URINALYSIS DIP (MANUAL ENTRY)
Bilirubin, UA: NEGATIVE
Glucose, UA: NEGATIVE mg/dL
Ketones, POC UA: NEGATIVE mg/dL
Nitrite, UA: NEGATIVE
Protein Ur, POC: NEGATIVE mg/dL
Spec Grav, UA: <=1.005 — AB (ref 1.010–1.025)
Urobilinogen, UA: 0.2 E.U./dL
pH, UA: 5.5 (ref 5.0–8.0)

## 2017-09-17 MED ORDER — INSULIN GLARGINE 100 UNIT/ML ~~LOC~~ SOLN: 20.0000 [IU] | Freq: Every day | SUBCUTANEOUS | 11 refills | Status: DC

## 2017-09-17 NOTE — Progress Notes (Addendum)
Subjective:    Patient ID: Wanda Weaver, female    DOB: 1960/12/31, 57 y.o.   MRN: [id number]   PCP: Raliegh Ip, NP  Chief Complaint  Patient presents with  . pap smear  . Diabetes    sugar dropping at work, and she gets lightheaded, would like to know how to manage this issue    HPI Wanda Weaver has history of Hypertension and Diabetes. She is here today for Pap Smear.   Current Status: Patient also here for routine gynecological exam.  She is not had a Pap smear in greater than 3 years. Patient states that she has never had an abnormal Pap smear.  She is nulliparous.  She is sexually active.  She denies abnormal vaginal discharge, vaginal  itching, vaginal burning, or dyspareunia.  Patient states that she does not perform monthly self breast exams.  She will make any appointment for mammogram.  She typically follows a balanced diet but does not exercise routinely. Body mass index is 33.84   Gynecologic History Patient's last menstrual period was 04/2017.  Obstetric History        OB History  Gravida Para Term Preterm AB Living  2 0 0 0 0 0  SAB TAB Ectopic Multiple Live Births   0 0 0 0 0     She is currently pre-menopausal. She states that her last regular menstrual period was 2016. She reports her last day of bleeding was 1 full day of menstrual bleeding 04/2017. She reports no discharge, discomfort, abdominal pain, nausea, vomiting, diarrhea, and constipation.   She has hot flashes and occasional headaches. She denies visual changes, dizziness, and falls.   She is sexually active.   She denies recent infections, fevers, chills, weight loss, and night sweats. Denies cough, heart palpitations, chest pain, and shortness of breath. She has occasional nausea and vomiting in the mornings at times.  Denies any other GI symptoms. Denies melena, epistaxis, and hematuria.  dysuria, urinary frequency, urinary urgency, and suprapubic pain.   She has mild numbness and tingling in her  hands. She has occasional generalized joint pains. She denies pain today.  Past Medical History:  Diagnosis Date  . Diabetes mellitus without complication (HCC)   . Hypertension     Family History  Problem Relation Age of Onset  . Diabetes Mother   . Dementia Mother   . Diabetes Father   . Hypertension Father     Social History   Socioeconomic History  . Marital status: Single    Spouse name: Not on file  . Number of children: Not on file  . Years of education: Not on file  . Highest education level: Not on file  Occupational History  . Not on file  Social Needs  . Financial resource strain: Not on file  . Food insecurity:    Worry: Not on file    Inability: Not on file  . Transportation needs:    Medical: Not on file    Non-medical: Not on file  Tobacco Use  . Smoking status: Never Smoker  . Smokeless tobacco: Never Used  Substance and Sexual Activity  . Alcohol use: No  . Drug use: No  . Sexual activity: Not on file  Lifestyle  . Physical activity:    Days per week: Not on file    Minutes per session: Not on file  . Stress: Not on file  Relationships  . Social connections:    Talks on phone: Not on file  Gets together: Not on file    Attends religious service: Not on file    Active member of club or organization: Not on file    Attends meetings of clubs or organizations: Not on file    Relationship status: Not on file  . Intimate partner violence:    Fear of current or ex partner: Not on file    Emotionally abused: Not on file    Physically abused: Not on file    Forced sexual activity: Not on file  Other Topics Concern  . Not on file  Social History Narrative  . Not on file    Past Surgical History:  Procedure Laterality Date  . APPENDECTOMY    . CHOLECYSTECTOMY    . WRIST SURGERY Bilateral     Immunization History  Administered Date(s) Administered  . Pneumococcal Polysaccharide-23 08/06/2017    Current Meds  Medication Sig  .  diclofenac (VOLTAREN) 75 MG EC tablet Take 1 tablet (75 mg total) by mouth at bedtime.  Marland Kitchen glipiZIDE (GLUCOTROL) 10 MG tablet Take 1 tablet (10 mg total) by mouth 2 (two) times daily before a meal.  . insulin glargine (LANTUS) 100 UNIT/ML injection Inject 0.2 mLs (20 Units total) into the skin at bedtime.  Marland Kitchen lisinopril (PRINIVIL,ZESTRIL) 20 MG tablet Take 1 tablet (20 mg total) by mouth daily.  . metFORMIN (GLUCOPHAGE) 500 MG tablet Take 2 tablets (1,000 mg total) by mouth 2 (two) times daily with a meal.  . [DISCONTINUED] insulin glargine (LANTUS) 100 UNIT/ML injection Inject 0.2 mLs (20 Units total) into the skin at bedtime.    No Known Allergies  BP (!) 165/78 (BP Location: Left Arm, Patient Position: Sitting, Cuff Size: Large)   Pulse 88   Temp 98.2 F (36.8 C) (Oral)   Ht 5\' 2"  (1.575 m)   Wt 185 lb (83.9 kg)   SpO2 100%   BMI 33.84 kg/m     Review of Systems  Constitutional: Negative.   HENT: Negative.   Eyes: Negative.   Respiratory: Negative.   Cardiovascular: Negative.   Gastrointestinal: Negative.   Endocrine: Negative.   Genitourinary: Negative.   Musculoskeletal: Negative.   Skin: Negative.   Allergic/Immunologic: Negative.   Neurological: Negative.   Hematological: Negative.   Psychiatric/Behavioral: Negative.    Objective:   Physical Exam  Constitutional: She is oriented to person, place, and time. She appears well-developed and well-nourished.  HENT:  Head: Normocephalic and atraumatic.  Right Ear: External ear normal.  Left Ear: External ear normal.  Nose: Nose normal.  Mouth/Throat: Oropharynx is clear and moist.  Eyes: Pupils are equal, round, and reactive to light. Conjunctivae and EOM are normal.  Neck: Normal range of motion. Neck supple.  Cardiovascular: Normal rate, regular rhythm, normal heart sounds and intact distal pulses.  Pulmonary/Chest: Effort normal and breath sounds normal.  Abdominal: Soft. Bowel sounds are normal.  Genitourinary:      Vaginal discharge (minimal blood discharge during Pap Smear) found.  Genitourinary Comments: 2-3 cm bleeding 'polyp' left side of cervix.  Musculoskeletal: Normal range of motion.  Neurological: She is alert and oriented to person, place, and time.  Skin: Skin is warm and dry. Capillary refill takes less than 2 seconds.  Psychiatric: She has a normal mood and affect. Her behavior is normal. Judgment and thought content normal.  Nursing note and vitals reviewed.  Assessment & Plan:   1. Pap smear for cervical cancer screening Vaginal bleeding noted. We will refer to Gynecology for further evaluation.  -  Pap IG, CT/NG w/ reflex HPV when ASC[facility]  2. Type 2 diabetes mellitus with complication, with long-term current use of insulin (HCC) Hgb A1c is decreased at 936 from 10.6 on 08/06/2017. Continue Glipizide, Metformin, and Insulin.  - insulin glargine (LANTUS) 100 UNIT/ML injection; Inject 0.2 mLs (20 Units total) into the skin at bedtime.  Dispense: 10 mL; Refill: 11 - Referral to Nutrition and Diabetes Services - HgB A1c - POCT urinalysis dipstick  3. Vaginal bleeding, abnormal - Ambulatory referral to Gynecology - Pap IG, CT/NG w/ reflex HPV when ASC[facility]  4. Hyperglycemia A1c is 9.6 today.   5. Essential hypertension Blood pressure is 165/78 today. She will continue Lisinopril as directed. We will possibly increase dosage or initiate additional treatment if blood pressures continue to be elevated.   6. Abnormal urinalysis Positive for blood and Leukocytes.  - Urinalysis Dipstick - Urine Culture  7. Urinary tract infection with hematuria, site unspecified - nitrofurantoin, macrocrystal-monohydrate, (MACROBID) 100 MG capsule; Take 1 capsule (100 mg total) by mouth 2 (two) times daily.  Dispense: 10 capsule; Refill: 0  8. Follow up She will follow up in 3 months.    Meds ordered this encounter  Medications  . insulin glargine (LANTUS) 100 UNIT/ML  injection    Sig: Inject 0.2 mLs (20 Units total) into the skin at bedtime.    Dispense:  10 mL    Refill:  11    Reinforce administration of insulin  . nitrofurantoin, macrocrystal-monohydrate, (MACROBID) 100 MG capsule    Sig: Take 1 capsule (100 mg total) by mouth 2 (two) times daily.    Dispense:  10 capsule    Refill:  0    Raliegh IpNatalie Kengo Sturges,  MSN, FNP-BC Patient [facility] [address]  [place], [place] [id number] [phone]

## 2017-09-19 MED ORDER — NITROFURANTOIN MONOHYD MACRO 100 MG PO CAPS: 100.0000 mg | ORAL_CAPSULE | Freq: Two times a day (BID) | ORAL | 0 refills | Status: DC

## 2017-09-20 ENCOUNTER — Telehealth: Payer: Self-pay

## 2017-09-20 MED FILL — $LANTUS 100 UNITS/ML VIAL: 100 | 28 days supply | Qty: 10 | Fill #0

## 2017-09-20 MED FILL — NITROFURANTOIN MONO-MCR 100: 100 | 5 days supply | Qty: 10 | Fill #0

## 2017-09-20 NOTE — Telephone Encounter (Signed)
-----   Message from Kallie LocksNatalie M Stroud, FNP sent at 09/19/2017  8:30 PM EDT ----- Regarding: "Lab Results" Wanda Weaver,  Please call patient with lab results. A1c is decreased at 9.6, from 10.6.  She is to continue DM meds as directed.   She is positive for UTI. Rx for Macrobid to pharmacy.  Referral to GYN for mild vaginal bleeding. Please tell her to expect call.   Thanks Wanda SonCarrie!

## 2017-09-20 NOTE — Telephone Encounter (Signed)
Patient notified and will pick up medication  

## 2017-09-21 ENCOUNTER — Encounter: Payer: Self-pay | Admitting: Obstetrics & Gynecology

## 2017-09-21 LAB — URINE CULTURE

## 2017-09-22 LAB — PAP IG, CT-NG, RFX HPV ASCU
Chlamydia, Nuc. Acid Amp: NEGATIVE
Gonococcus by Nucleic Acid Amp: NEGATIVE
PAP Smear Comment: 0

## 2017-10-11 ENCOUNTER — Encounter: Payer: Self-pay | Admitting: Family Medicine

## 2017-10-11 ENCOUNTER — Ambulatory Visit (INDEPENDENT_AMBULATORY_CARE_PROVIDER_SITE_OTHER): Payer: Self-pay | Admitting: Family Medicine

## 2017-10-11 ENCOUNTER — Telehealth: Payer: Self-pay

## 2017-10-11 VITALS — BP 160/84 | HR 98 | Temp 98.1°F | Ht 62.0 in | Wt 185.2 lb

## 2017-10-11 DIAGNOSIS — E118 Type 2 diabetes mellitus with unspecified complications: Secondary | ICD-10-CM

## 2017-10-11 DIAGNOSIS — Z09 Encounter for follow-up examination after completed treatment for conditions other than malignant neoplasm: Secondary | ICD-10-CM

## 2017-10-11 DIAGNOSIS — R7309 Other abnormal glucose: Secondary | ICD-10-CM

## 2017-10-11 DIAGNOSIS — R8290 Unspecified abnormal findings in urine: Secondary | ICD-10-CM

## 2017-10-11 DIAGNOSIS — Z794 Long term (current) use of insulin: Secondary | ICD-10-CM

## 2017-10-11 DIAGNOSIS — N390 Urinary tract infection, site not specified: Secondary | ICD-10-CM

## 2017-10-11 DIAGNOSIS — I10 Essential (primary) hypertension: Secondary | ICD-10-CM

## 2017-10-11 LAB — POCT URINALYSIS DIP (MANUAL ENTRY)
Bilirubin, UA: NEGATIVE
Blood, UA: NEGATIVE
Glucose, UA: 500 mg/dL — AB
Ketones, POC UA: NEGATIVE mg/dL
Nitrite, UA: NEGATIVE
Protein Ur, POC: NEGATIVE mg/dL
Spec Grav, UA: <=1.005 — AB (ref 1.010–1.025)
Urobilinogen, UA: 0.2 E.U./dL
pH, UA: 5 (ref 5.0–8.0)

## 2017-10-11 LAB — GLUCOSE, POCT (MANUAL RESULT ENTRY): POC Glucose: 256 mg/dl — AB (ref 70–99)

## 2017-10-11 MED ORDER — SULFAMETHOXAZOLE-TRIMETHOPRIM 800-160 MG PO TABS: 1.0000 | ORAL_TABLET | Freq: Two times a day (BID) | ORAL | 0 refills | Status: DC

## 2017-10-11 MED ORDER — INSULIN GLARGINE 100 UNIT/ML ~~LOC~~ SOLN: 30.0000 [IU] | Freq: Every day | SUBCUTANEOUS | 11 refills | Status: DC

## 2017-10-11 MED FILL — TRUE METRIX TEST STRIP: 30 days supply | Qty: 100 | Fill #1

## 2017-10-11 MED FILL — SULFAMETHOXAZOLE-TMP DS TAB: 800-160 | 10 days supply | Qty: 20 | Fill #0

## 2017-10-11 NOTE — Progress Notes (Signed)
Subjective:    Patient ID: Wanda Weaver, female    DOB: 1961-02-05, 57 y.o.   MRN: [id number]   PCP: Kathe Becton, NP  Chief Complaint  Patient presents with  . Follow-up    elevated blod sugar    HPI  Wanda Weaver has a past medical history of Hypertension and Diabetes. She is here today for elevated glucose levels.   Current Status: Since her last office visit, she is doing well with no complaints. Yesterday she has a syncope incident and a dizzy episode while at work and was eventually sent home after her glucose reading was 441. When she got home, she injected Lantus 20 mg as prescribed, hydrated herself well and rested, and blood glucose reading decreased to 180.   She denies fevers, chills, fatigue, recent infections, weight loss, and night sweats.   She has not had any headaches, visual changes, dizziness, and falls.   No chest pain, heart palpitations, cough and shortness of breath reported.   No reports of GI problems such as nausea, vomiting, diarrhea, and constipation. She has no reports of blood in stools, dysuria and hematuria.   No depression or anxiety.   She denies pain today.   Past Medical History:  Diagnosis Date  . Diabetes mellitus without complication ([place])   . Hypertension     Family History  Problem Relation Age of Onset  . Diabetes Mother   . Dementia Mother   . Diabetes Father   . Hypertension Father     Social History   Tobacco Use  . Smoking status: Never Smoker  . Smokeless tobacco: Never Used  Substance Use Topics  . Alcohol use: No  . Drug use: No    Past Surgical History:  Procedure Laterality Date  . APPENDECTOMY    . CHOLECYSTECTOMY    . WRIST SURGERY Bilateral     Immunization History  Administered Date(s) Administered  . Pneumococcal Polysaccharide-23 08/06/2017   Current Meds  Medication Sig  . blood glucose meter kit and supplies KIT Dispense based on patient and insurance preference.  Marland Kitchen glipiZIDE (GLUCOTROL) 10 MG  tablet Take 1 tablet (10 mg total) by mouth 2 (two) times daily before a meal.  . glucose blood (ACCU-CHEK AVIVA) test strip Use as instructed  . insulin glargine (LANTUS) 100 UNIT/ML injection Inject 0.2 mLs (20 Units total) into the skin at bedtime.  . Insulin Pen Needle (BD ULTRA-FINE PEN NEEDLES) 29G X 12.7MM MISC ICD10 E11.9 for use with insulin administration  . Lancets 30G MISC 1 each by Does not apply route as directed.  Marland Kitchen lisinopril (PRINIVIL,ZESTRIL) 20 MG tablet Take 1 tablet (20 mg total) by mouth daily.  . metFORMIN (GLUCOPHAGE) 500 MG tablet Take 2 tablets (1,000 mg total) by mouth 2 (two) times daily with a meal.  . nitrofurantoin, macrocrystal-monohydrate, (MACROBID) 100 MG capsule Take 1 capsule (100 mg total) by mouth 2 (two) times daily.    No Known Allergies  BP (!) 160/84 (BP Location: Right Arm, Patient Position: Sitting, Cuff Size: Large)   Pulse 98   Temp 98.1 F (36.7 C) (Oral)   Ht '5\' 2"'$  (1.575 m)   Wt 185 lb 3.2 oz (84 kg)   SpO2 100%   BMI 33.87 kg/m   Review of Systems  Constitutional: Negative.   HENT: Negative.   Eyes: Negative.   Respiratory: Negative.   Cardiovascular: Negative.   Gastrointestinal: Positive for abdominal distention (Obese).  Endocrine: Negative.   Genitourinary: Negative.   Musculoskeletal:  Negative.   Skin: Negative.   Allergic/Immunologic: Negative.   Neurological: Negative.   Hematological: Negative.   Psychiatric/Behavioral: Negative.    Objective:   Physical Exam  Constitutional: She is oriented to person, place, and time. She appears well-developed and well-nourished.  HENT:  Head: Normocephalic and atraumatic.  Right Ear: External ear normal.  Left Ear: External ear normal.  Nose: Nose normal.  Mouth/Throat: Oropharynx is clear and moist.  Eyes: Pupils are equal, round, and reactive to light. Conjunctivae and EOM are normal.  Neck: Normal range of motion. Neck supple.  Cardiovascular: Normal rate, regular  rhythm, normal heart sounds and intact distal pulses.  Pulmonary/Chest: Effort normal and breath sounds normal.  Abdominal: Soft. Bowel sounds are normal. She exhibits distension (Obese).  Neurological: She is alert and oriented to person, place, and time.  Skin: Skin is warm and dry. Capillary refill takes less than 2 seconds.  Psychiatric: She has a normal mood and affect. Her behavior is normal. Judgment and thought content normal.  Nursing note and vitals reviewed.  Assessment & Plan:   1. Type 2 diabetes mellitus with complication, with long-term current use of insulin (HCC) Glucose is 256 today. Urinalysis reveals = 500, dehydration because of decreased spec grav., and Leukocytes. She will increase her fluid intake. We will order urine culture to further evaluate. She will continue Glucotrol and Metformin as prescribed. We will increase Lantus to 30 units QHS. She will monitor glucose readings at least 2 times daily.  - POCT urinalysis dipstick   2. Elevated glucose - POCT glucose (manual entry)  3. Abnormal urinalysis - Urine Culture  4. Urinary tract infection without hematuria, site unspecified - Urine Culture  5. Essential hypertension  Blood pressure is elevated at 160/84 today. She did not take her blood pressure medications today. She will continue taking Lisinopril as prescribed.   Decrease high sodium intake, excessive alcohol intake, increase potassium intake, smoking cessation, and increase physical activity. Follow DASH diet.   6. Follow up She will follow up in 2 weeks.   Meds ordered this encounter  Medications  . sulfamethoxazole-trimethoprim (BACTRIM DS,SEPTRA DS) 800-160 MG tablet    Sig: Take 1 tablet by mouth 2 (two) times daily.    Dispense:  20 tablet    Refill:  0   Kathe Becton,  MSN, FNP-BC Patient [facility] [address] [place], [place] [address] [phone]

## 2017-10-14 LAB — URINE CULTURE

## 2017-10-21 ENCOUNTER — Encounter: Payer: Self-pay | Admitting: Obstetrics & Gynecology

## 2017-10-21 NOTE — Telephone Encounter (Signed)
Notes not needed

## 2017-10-22 ENCOUNTER — Encounter: Payer: Self-pay | Admitting: Family Medicine

## 2017-10-22 ENCOUNTER — Ambulatory Visit: Payer: Self-pay | Admitting: Family Medicine

## 2017-10-22 ENCOUNTER — Ambulatory Visit (INDEPENDENT_AMBULATORY_CARE_PROVIDER_SITE_OTHER): Payer: Self-pay | Admitting: Family Medicine

## 2017-10-22 VITALS — BP 138/74 | HR 88 | Temp 98.1°F | Ht 62.0 in | Wt 190.0 lb

## 2017-10-22 DIAGNOSIS — Z794 Long term (current) use of insulin: Secondary | ICD-10-CM

## 2017-10-22 DIAGNOSIS — I10 Essential (primary) hypertension: Secondary | ICD-10-CM

## 2017-10-22 DIAGNOSIS — R319 Hematuria, unspecified: Secondary | ICD-10-CM

## 2017-10-22 DIAGNOSIS — N390 Urinary tract infection, site not specified: Secondary | ICD-10-CM

## 2017-10-22 DIAGNOSIS — R7309 Other abnormal glucose: Secondary | ICD-10-CM

## 2017-10-22 DIAGNOSIS — R8290 Unspecified abnormal findings in urine: Secondary | ICD-10-CM

## 2017-10-22 DIAGNOSIS — E118 Type 2 diabetes mellitus with unspecified complications: Secondary | ICD-10-CM

## 2017-10-22 LAB — POCT URINALYSIS DIP (MANUAL ENTRY)
Bilirubin, UA: NEGATIVE
Glucose, UA: 500 mg/dL — AB
Ketones, POC UA: NEGATIVE mg/dL
Nitrite, UA: NEGATIVE
Protein Ur, POC: NEGATIVE mg/dL
Spec Grav, UA: <=1.005 — AB (ref 1.010–1.025)
Urobilinogen, UA: 0.2 E.U./dL
pH, UA: 5 (ref 5.0–8.0)

## 2017-10-22 LAB — POCT GLYCOSYLATED HEMOGLOBIN (HGB A1C): Hemoglobin A1C: 9.7 % — AB (ref 4.0–5.6)

## 2017-10-22 LAB — GLUCOSE, POCT (MANUAL RESULT ENTRY)
POC Glucose: 321 mg/dl — AB (ref 70–99)
POC Glucose: 276 mg/dl — AB (ref 70–99)

## 2017-10-22 MED ORDER — INSULIN LISPRO 100 UNIT/ML ~~LOC~~ SOLN
10.0000 [IU] | Freq: Once | SUBCUTANEOUS | Status: AC
Start: 2017-10-22 — End: 2017-10-22
  Administered 2017-10-22: 10 [IU] via SUBCUTANEOUS

## 2017-10-22 MED ORDER — CLONIDINE HCL 0.1 MG PO TABS
0.1000 mg | ORAL_TABLET | Freq: Once | ORAL | Status: AC
  Administered 2017-10-22: 0.1 mg via ORAL

## 2017-10-22 MED ORDER — LISINOPRIL 40 MG PO TABS: 40.0000 mg | ORAL_TABLET | Freq: Every day | ORAL | 3 refills | Status: DC

## 2017-10-22 MED ORDER — INSULIN GLARGINE 100 UNIT/ML ~~LOC~~ SOLN: 40.0000 [IU] | Freq: Every day | SUBCUTANEOUS | 11 refills | Status: DC

## 2017-10-22 MED FILL — LISINOPRIL 40 MG TABLET: 40 | 30 days supply | Qty: 30 | Fill #0

## 2017-10-22 MED FILL — $LANTUS 100 UNITS/ML VIAL: 100 | 25 days supply | Qty: 10 | Fill #0

## 2017-10-22 NOTE — Progress Notes (Addendum)
Subjective:    Patient ID: Kaylynn Chamblin, female    DOB: 04/15/1960, 57 y.o.   MRN: [id number]   PCP: Kathe Becton, NP  Chief Complaint  Patient presents with  . Follow-up    HTN    HPI  Ms. Edison has a past medical history of Hypertension and Diabetes. She is here today for follow up.  Current Status: Since her last office visit, she is doing well with no complaints.  She has not had any incidents of syncope and dizziness lately. She denies signs and symptoms of Hyperglycemia including: Increased thirst, headaches, trouble concentrating, blurred vision, frequent urination, fatigue (weak, tired feeling) and weight loss.  She denies fevers, chills, fatigue, recent infections, and night sweats.   She has not had any syncopy, dizziness, and falls.   No chest pain, heart palpitations, cough and shortness of breath reported.   No reports of GI problems such as nausea, vomiting, diarrhea, and constipation. She has no reports of blood in stools, dysuria and hematuria.   No depression or anxiety.   She denies pain today.   Past Medical History:  Diagnosis Date  . Diabetes mellitus without complication ([place])   . Hypertension     Family History  Problem Relation Age of Onset  . Diabetes Mother   . Dementia Mother   . Diabetes Father   . Hypertension Father     Social History   Tobacco Use  . Smoking status: Never Smoker  . Smokeless tobacco: Never Used  Substance Use Topics  . Alcohol use: No  . Drug use: No    Past Surgical History:  Procedure Laterality Date  . APPENDECTOMY    . CHOLECYSTECTOMY    . WRIST SURGERY Bilateral     Immunization History  Administered Date(s) Administered  . Pneumococcal Polysaccharide-23 08/06/2017   Current Meds  Medication Sig  . blood glucose meter kit and supplies KIT Dispense based on patient and insurance preference.  . diclofenac (VOLTAREN) 75 MG EC tablet Take 1 tablet (75 mg total) by mouth at bedtime.  Marland Kitchen glipiZIDE  (GLUCOTROL) 10 MG tablet Take 1 tablet (10 mg total) by mouth 2 (two) times daily before a meal.  . glucose blood (ACCU-CHEK AVIVA) test strip Use as instructed  . ibuprofen (ADVIL,MOTRIN) 600 MG tablet Take 1 tablet (600 mg total) by mouth every 6 (six) hours as needed.  . Insulin Pen Needle (BD ULTRA-FINE PEN NEEDLES) 29G X 12.7MM MISC ICD10 E11.9 for use with insulin administration  . Lancets 30G MISC 1 each by Does not apply route as directed.  . metFORMIN (GLUCOPHAGE) 500 MG tablet Take 2 tablets (1,000 mg total) by mouth 2 (two) times daily with a meal.  . sulfamethoxazole-trimethoprim (BACTRIM DS,SEPTRA DS) 800-160 MG tablet Take 1 tablet by mouth 2 (two) times daily.  . [DISCONTINUED] insulin glargine (LANTUS) 100 UNIT/ML injection Inject 0.3 mLs (30 Units total) into the skin at bedtime.  . [DISCONTINUED] lisinopril (PRINIVIL,ZESTRIL) 20 MG tablet Take 1 tablet (20 mg total) by mouth daily.    No Known Allergies  BP 138/74   Pulse 88   Temp 98.1 F (36.7 C) (Oral)   Ht '5\' 2"'$  (1.575 m)   Wt 190 lb (86.2 kg)   SpO2 99%   BMI 34.75 kg/m   Review of Systems  Constitutional: Negative.   HENT: Negative.   Eyes: Negative.   Respiratory: Negative.   Cardiovascular: Negative.   Gastrointestinal: Positive for abdominal distention (Obese).  Endocrine: Negative.  Genitourinary: Negative.   Musculoskeletal: Negative.   Skin: Negative.   Allergic/Immunologic: Negative.   Neurological: Negative.   Hematological: Negative.   Psychiatric/Behavioral: Negative.    Objective:   Physical Exam  Constitutional: She is oriented to person, place, and time. She appears well-developed and well-nourished.  HENT:  Head: Normocephalic and atraumatic.  Right Ear: External ear normal.  Left Ear: External ear normal.  Nose: Nose normal.  Mouth/Throat: Oropharynx is clear and moist.  Eyes: Pupils are equal, round, and reactive to light. Conjunctivae and EOM are normal.  Neck: Normal range of  motion. Neck supple.  Cardiovascular: Normal rate, regular rhythm, normal heart sounds and intact distal pulses.  Pulmonary/Chest: Effort normal and breath sounds normal.  Abdominal: Soft. Bowel sounds are normal. She exhibits distension (Obese).  Neurological: She is alert and oriented to person, place, and time.  Skin: Skin is warm and dry. Capillary refill takes less than 2 seconds.  Psychiatric: She has a normal mood and affect. Her behavior is normal. Judgment and thought content normal.  Nursing note and vitals reviewed.  Assessment & Plan:   1. Type 2 diabetes mellitus with complication, with long-term current use of insulin (HCC) Glucose has increased to 321 today. She will continue Glucotrol and Metformin as prescribed.   We will increase Lantus to 40 units QHS. She will monitor glucose readings at least 2 times daily.   Urinalysis reveals = 500, dehydration because of decreased spec grav., and Leukocytes. She will increase her fluid intake.  - POCT urinalysis dipstick   We will refer her to Nutrition again today. She is encouraged to keep appointment for Diabetic teaching and Nutrition.   2. Elevated glucose - POCT glucose (manual entry)  3. Abnormal urinalysis  4. Urinary tract infection with hematuria, site unspecified She is currently completing Bactrim for UTI. Urinalysis today reveals moderate Leukocytes. We will sent Rx for Macrobid to pharmacy today.   5. Essential hypertension  Blood pressure is elevated at 161/96 today. She received Clonidine 0.1 mg in office today. Blood pressure decreased to 138/74.   Her diastolic blood pressures are increasing. We will increase Lisinopril to 40 mg daily.   Decrease high sodium intake, excessive alcohol intake, increase potassium intake, smoking cessation, and increase physical activity. Follow DASH diet.   6. Follow up She will follow up in 1 month.    Meds ordered this encounter  Medications  . cloNIDine (CATAPRES)  tablet 0.1 mg  . insulin lispro (HUMALOG) injection 10 Units  . lisinopril (PRINIVIL,ZESTRIL) 40 MG tablet    Sig: Take 1 tablet (40 mg total) by mouth daily.    Dispense:  90 tablet    Refill:  3  . insulin glargine (LANTUS) 100 UNIT/ML injection    Sig: Inject 0.4 mLs (40 Units total) into the skin at bedtime.    Dispense:  10 mL    Refill:  [place],  MSN, FNP-BC Patient [facility] [address] [place], [place] [address] [phone]

## 2017-10-22 NOTE — Patient Instructions (Signed)
DASH Eating Plan DASH stands for "Dietary Approaches to Stop Hypertension." The DASH eating plan is a healthy eating plan that has been shown to reduce high blood pressure (hypertension). It may also reduce your risk for type 2 diabetes, heart disease, and stroke. The DASH eating plan may also help with weight loss. What are tips for following this plan? General guidelines  Avoid eating more than 2,300 mg (milligrams) of salt (sodium) a day. If you have hypertension, you may need to reduce your sodium intake to 1,500 mg a day.  Limit alcohol intake to no more than 1 drink a day for nonpregnant women and 2 drinks a day for men. One drink equals 12 oz of beer, 5 oz of wine, or 1 oz of hard liquor.  Work with your health care provider to maintain a healthy body weight or to lose weight. Ask what an ideal weight is for you.  Get at least 30 minutes of exercise that causes your heart to beat faster (aerobic exercise) most days of the week. Activities may include walking, swimming, or biking.  Work with your health care provider or diet and nutrition specialist (dietitian) to adjust your eating plan to your individual calorie needs. Reading food labels  Check food labels for the amount of sodium per serving. Choose foods with less than 5 percent of the Daily Value of sodium. Generally, foods with less than 300 mg of sodium per serving fit into this eating plan.  To find whole grains, look for the word "whole" as the first word in the ingredient list. Shopping  Buy products labeled as "low-sodium" or "no salt added."  Buy fresh foods. Avoid canned foods and premade or frozen meals. Cooking  Avoid adding salt when cooking. Use salt-free seasonings or herbs instead of table salt or sea salt. Check with your health care provider or pharmacist before using salt substitutes.  Do not fry foods. Cook foods using healthy methods such as baking, boiling, grilling, and broiling instead.  Cook with  heart-healthy oils, such as olive, canola, soybean, or sunflower oil. Meal planning   Eat a balanced diet that includes: ? 5 or more servings of fruits and vegetables each day. At each meal, try to fill half of your plate with fruits and vegetables. ? Up to 6-8 servings of whole grains each day. ? Less than 6 oz of lean meat, poultry, or fish each day. A 3-oz serving of meat is about the same size as a deck of cards. One egg equals 1 oz. ? 2 servings of low-fat dairy each day. ? A serving of nuts, seeds, or beans 5 times each week. ? Heart-healthy fats. Healthy fats called Omega-3 fatty acids are found in foods such as flaxseeds and coldwater fish, like sardines, salmon, and mackerel.  Limit how much you eat of the following: ? Canned or prepackaged foods. ? Food that is high in trans fat, such as fried foods. ? Food that is high in saturated fat, such as fatty meat. ? Sweets, desserts, sugary drinks, and other foods with added sugar. ? Full-fat dairy products.  Do not salt foods before eating.  Try to eat at least 2 vegetarian meals each week.  Eat more home-cooked food and less restaurant, buffet, and fast food.  When eating at a restaurant, ask that your food be prepared with less salt or no salt, if possible. What foods are recommended? The items listed may not be a complete list. Talk with your dietitian about what   dietary choices are best for you. Grains Whole-grain or whole-wheat bread. Whole-grain or whole-wheat pasta. Brown rice. Oatmeal. Quinoa. Bulgur. Whole-grain and low-sodium cereals. Pita bread. Low-fat, low-sodium crackers. Whole-wheat flour tortillas. Vegetables Fresh or frozen vegetables (raw, steamed, roasted, or grilled). Low-sodium or reduced-sodium tomato and vegetable juice. Low-sodium or reduced-sodium tomato sauce and tomato paste. Low-sodium or reduced-sodium canned vegetables. Fruits All fresh, dried, or frozen fruit. Canned fruit in natural juice (without  added sugar). Meat and other protein foods Skinless chicken or turkey. Ground chicken or turkey. Pork with fat trimmed off. Fish and seafood. Egg whites. Dried beans, peas, or lentils. Unsalted nuts, nut butters, and seeds. Unsalted canned beans. Lean cuts of beef with fat trimmed off. Low-sodium, lean deli meat. Dairy Low-fat (1%) or fat-free (skim) milk. Fat-free, low-fat, or reduced-fat cheeses. Nonfat, low-sodium ricotta or cottage cheese. Low-fat or nonfat yogurt. Low-fat, low-sodium cheese. Fats and oils Soft margarine without trans fats. Vegetable oil. Low-fat, reduced-fat, or light mayonnaise and salad dressings (reduced-sodium). Canola, safflower, olive, soybean, and sunflower oils. Avocado. Seasoning and other foods Herbs. Spices. Seasoning mixes without salt. Unsalted popcorn and pretzels. Fat-free sweets. What foods are not recommended? The items listed may not be a complete list. Talk with your dietitian about what dietary choices are best for you. Grains Baked goods made with fat, such as croissants, muffins, or some breads. Dry pasta or rice meal packs. Vegetables Creamed or fried vegetables. Vegetables in a cheese sauce. Regular canned vegetables (not low-sodium or reduced-sodium). Regular canned tomato sauce and paste (not low-sodium or reduced-sodium). Regular tomato and vegetable juice (not low-sodium or reduced-sodium). Pickles. Olives. Fruits Canned fruit in a light or heavy syrup. Fried fruit. Fruit in cream or butter sauce. Meat and other protein foods Fatty cuts of meat. Ribs. Fried meat. Bacon. Sausage. Bologna and other processed lunch meats. Salami. Fatback. Hotdogs. Bratwurst. Salted nuts and seeds. Canned beans with added salt. Canned or smoked fish. Whole eggs or egg yolks. Chicken or turkey with skin. Dairy Whole or 2% milk, cream, and half-and-half. Whole or full-fat cream cheese. Whole-fat or sweetened yogurt. Full-fat cheese. Nondairy creamers. Whipped toppings.  Processed cheese and cheese spreads. Fats and oils Butter. Stick margarine. Lard. Shortening. Ghee. Bacon fat. Tropical oils, such as coconut, palm kernel, or palm oil. Seasoning and other foods Salted popcorn and pretzels. Onion salt, garlic salt, seasoned salt, table salt, and sea salt. Worcestershire sauce. Tartar sauce. Barbecue sauce. Teriyaki sauce. Soy sauce, including reduced-sodium. Steak sauce. Canned and packaged gravies. Fish sauce. Oyster sauce. Cocktail sauce. Horseradish that you find on the shelf. Ketchup. Mustard. Meat flavorings and tenderizers. Bouillon cubes. Hot sauce and Tabasco sauce. Premade or packaged marinades. Premade or packaged taco seasonings. Relishes. Regular salad dressings. Where to find more information:  National Heart, Lung, and Blood Institute: www.nhlbi.nih.gov  American Heart Association: www.heart.org Summary  The DASH eating plan is a healthy eating plan that has been shown to reduce high blood pressure (hypertension). It may also reduce your risk for type 2 diabetes, heart disease, and stroke.  With the DASH eating plan, you should limit salt (sodium) intake to 2,300 mg a day. If you have hypertension, you may need to reduce your sodium intake to 1,500 mg a day.  When on the DASH eating plan, aim to eat more fresh fruits and vegetables, whole grains, lean proteins, low-fat dairy, and heart-healthy fats.  Work with your health care provider or diet and nutrition specialist (dietitian) to adjust your eating plan to your individual   calorie needs. This information is not intended to replace advice given to you by your health care provider. Make sure you discuss any questions you have with your health care provider. Document Released: 03/19/2011 Document Revised: 03/23/2016 Document Reviewed: 03/23/2016 Elsevier Interactive Patient Education  2018 Elsevier Inc.  

## 2017-10-24 MED ORDER — NITROFURANTOIN MONOHYD MACRO 100 MG PO CAPS: 100.0000 mg | ORAL_CAPSULE | Freq: Two times a day (BID) | ORAL | 0 refills | Status: AC

## 2017-10-24 NOTE — Addendum Note (Signed)
Addended by: Kallie LocksSTROUD, Clova Morlock M on: 10/24/2017 07:01 PM   Modules accepted: Orders

## 2017-10-25 ENCOUNTER — Telehealth: Payer: Self-pay

## 2017-10-25 NOTE — Telephone Encounter (Signed)
Patient notified

## 2017-10-25 NOTE — Telephone Encounter (Signed)
-----   Message from Kallie LocksNatalie M Stroud, FNP sent at 10/24/2017  6:58 PM EDT ----- Regarding: "Rx" Please inform patient that Rx for Macrobid to pharmacy today.   We also referred her to Diabetes Nutrition today. This will be her 3 referral, so it is important for her to follow up for better management of her diabetes.   Thank you.

## 2017-11-19 ENCOUNTER — Encounter: Payer: Medicaid Other | Attending: Family Medicine | Admitting: *Deleted

## 2017-11-19 DIAGNOSIS — Z794 Long term (current) use of insulin: Secondary | ICD-10-CM | POA: Insufficient documentation

## 2017-11-19 DIAGNOSIS — Z713 Dietary counseling and surveillance: Secondary | ICD-10-CM | POA: Insufficient documentation

## 2017-11-19 DIAGNOSIS — E118 Type 2 diabetes mellitus with unspecified complications: Secondary | ICD-10-CM | POA: Insufficient documentation

## 2017-11-19 NOTE — Patient Instructions (Signed)
Plan:  Aim for 2 Carb Choices per meal (30 grams) +/- 1 either way  Aim for 0-1 Carbs per snack if hungry  Include protein in moderation with your meals and snacks Consider reading food labels for Total Carbohydrate of foods Continue with your activity level by walking for 15-30 minutes daily as tolerated Continue checking BG at alternate times per day   Continue taking medication as directed by MD

## 2017-11-19 NOTE — Progress Notes (Signed)
Diabetes Self-Management Education  Visit Type: First/Initial  Appt. Start Time: 1100 Appt. End Time: 1230  11/19/2017  [name], identified by name and date of birth, is a 57 y.o. female with a diagnosis of Diabetes: Type 2. Patient here with her SO, Wanda Weaver, who participated in the visit in a supportive way. She states she has just left her very stressful job at [employer] and is now working in [employer]. She is fearful about her diabetes and concern that her BG's have been so high. Her eating habits appear to be appropriate especially concerning her limited income and ability to buy food. She states she and Wanda Weaver walk around a school track every day for about 30 minutes and she is checking her BG daily.   ASSESSMENT  There were no vitals taken for this visit. There is no height or weight on file to calculate BMI.  Diabetes Self-Management Education - 11/19/17 1105      Visit Information   Visit Type  First/Initial      Initial Visit   Diabetes Type  Type 2    Are you currently following a meal plan?  Yes    What type of meal plan do you follow?  Diabetic    Are you taking your medications as prescribed?  Yes    Date Diagnosed  2015      Health Coping   How would you rate your overall health?  Fair      Psychosocial Assessment   Patient Belief/Attitude about Diabetes  Afraid    Self-care barriers  Lack of material resources    Self-management support  Family    Other persons present  Patient;Spouse/SO    Patient Concerns  Nutrition/Meal planning;Glycemic Control;Medication    Special Needs  Simplified materials    Learning Readiness  Change in progress    How often do you need to have someone help you when you read instructions, pamphlets, or other written materials from your doctor or pharmacy?  1 - Never    What is the last grade level you completed in school?  12      Pre-Education Assessment   Patient understands the diabetes disease and treatment process.   Needs Instruction    Patient understands incorporating nutritional management into lifestyle.  Needs Instruction    Patient undertands incorporating physical activity into lifestyle.  Needs Instruction    Patient understands using medications safely.  Needs Instruction    Patient understands monitoring blood glucose, interpreting and using results  Needs Instruction    Patient understands prevention, detection, and treatment of acute complications.  Needs Instruction    Patient understands prevention, detection, and treatment of chronic complications.  Needs Instruction    Patient understands how to develop strategies to address psychosocial issues.  Needs Instruction      Complications   Last HgB A1C per patient/outside source  9.7 %    How often do you check your blood sugar?  3-4 times/day    Fasting Blood glucose range (mg/dL)  161-096;>045    Postprandial Blood glucose range (mg/dL)  409-811;>914    Number of hypoglycemic episodes per month  0    Have you had a dilated eye exam in the past 12 months?  Yes    Have you had a dental exam in the past 12 months?  No    Are you checking your feet?  Yes    How many days per week are you checking your feet?  4      Dietary Intake   Breakfast  skips OR scrambled egg and sausage OR 1 pop tart     Snack (morning)  grapes OR chips    Lunch  sandwich and chips OR fresh fruit OR left overs    Dinner  cheeseburger and fries OR potato soup and baked fish and green beans OR hamburger helper OR chicken, starch, vegetables    Snack (evening)  1 graham cracker occaisonally with PNB OR sugar free ice cream    Beverage(s)  water, diet soda, coffee black, Crystal Light      Exercise   Exercise Type  Light (walking / raking leaves)    How many days per week to you exercise?  7    How many minutes per day do you exercise?  30    Total minutes per week of exercise  210      Patient Education   Previous Diabetes Education  No    Disease state    Definition of diabetes, type 1 and 2, and the diagnosis of diabetes    Nutrition management   Role of diet in the treatment of diabetes and the relationship between the three main macronutrients and blood glucose level;Carbohydrate counting;Reviewed blood glucose goals for pre and post meals and how to evaluate the patients' food intake on their blood glucose level.;Food label reading, portion sizes and measuring food.    Physical activity and exercise   Role of exercise on diabetes management, blood pressure control and cardiac health.;Helped patient identify appropriate exercises in relation to his/her diabetes, diabetes complications and other health issue.    Medications  Reviewed patients medication for diabetes, action, purpose, timing of dose and side effects.    Monitoring  Identified appropriate SMBG and/or A1C goals.    Acute complications  Taught treatment of hypoglycemia - the 15 rule.    Chronic complications  Relationship between chronic complications and blood glucose control    Psychosocial adjustment  Role of stress on diabetes      Individualized Goals (developed by patient)   Nutrition  Follow meal plan discussed    Physical Activity  Exercise 3-5 times per week    Medications  take my medication as prescribed    Monitoring   test blood glucose pre and post meals as discussed      Post-Education Assessment   Patient understands the diabetes disease and treatment process.  Demonstrates understanding / competency    Patient understands incorporating nutritional management into lifestyle.  Demonstrates understanding / competency    Patient undertands incorporating physical activity into lifestyle.  Demonstrates understanding / competency    Patient understands using medications safely.  Demonstrates understanding / competency    Patient understands monitoring blood glucose, interpreting and using results  Demonstrates understanding / competency    Patient understands prevention,  detection, and treatment of acute complications.  Demonstrates understanding / competency    Patient understands prevention, detection, and treatment of chronic complications.  Demonstrates understanding / competency    Patient understands how to develop strategies to address psychosocial issues.  Demonstrates understanding / competency    Patient understands how to develop strategies to promote health/change behavior.  Demonstrates understanding / competency      Outcomes   Expected Outcomes  Demonstrated interest in learning. Expect positive outcomes    Future DMSE  PRN    Program Status  Completed       Individualized Plan for Diabetes Self-Management Training:   Learning  Objective:  Patient will have a greater understanding of diabetes self-management. Patient education plan is to attend individual and/or group sessions per assessed needs and concerns.   Plan:   Patient Instructions  Plan:  Aim for 2 Carb Choices per meal (30 grams) +/- 1 either way  Aim for 0-1 Carbs per snack if hungry  Include protein in moderation with your meals and snacks Consider reading food labels for Total Carbohydrate of foods Continue with your activity level by walking for 15-30 minutes daily as tolerated Continue checking BG at alternate times per day   Continue taking medication as directed by MD  Expected Outcomes:  Demonstrated interest in learning. Expect positive outcomes  Education material provided: ADA Diabetes: Your Take Control Guide, A1C conversion sheet, Meal plan card and Carbohydrate counting sheet  If problems or questions, patient to contact team via:  Phone  Future DSME appointment: PRN

## 2017-11-22 ENCOUNTER — Encounter: Payer: Self-pay | Admitting: Family Medicine

## 2017-11-22 ENCOUNTER — Ambulatory Visit (INDEPENDENT_AMBULATORY_CARE_PROVIDER_SITE_OTHER): Payer: Self-pay | Admitting: Family Medicine

## 2017-11-22 VITALS — BP 140/82 | HR 94 | Temp 98.5°F | Ht 62.0 in | Wt 189.0 lb

## 2017-11-22 DIAGNOSIS — Z09 Encounter for follow-up examination after completed treatment for conditions other than malignant neoplasm: Secondary | ICD-10-CM

## 2017-11-22 DIAGNOSIS — Z794 Long term (current) use of insulin: Secondary | ICD-10-CM

## 2017-11-22 DIAGNOSIS — E118 Type 2 diabetes mellitus with unspecified complications: Secondary | ICD-10-CM

## 2017-11-22 DIAGNOSIS — R319 Hematuria, unspecified: Secondary | ICD-10-CM

## 2017-11-22 DIAGNOSIS — N390 Urinary tract infection, site not specified: Secondary | ICD-10-CM

## 2017-11-22 DIAGNOSIS — I10 Essential (primary) hypertension: Secondary | ICD-10-CM

## 2017-11-22 DIAGNOSIS — R8290 Unspecified abnormal findings in urine: Secondary | ICD-10-CM

## 2017-11-22 LAB — POCT URINALYSIS DIP (MANUAL ENTRY)
Bilirubin, UA: NEGATIVE
Glucose, UA: 250 mg/dL — AB
Ketones, POC UA: NEGATIVE mg/dL
Nitrite, UA: NEGATIVE
Protein Ur, POC: 30 mg/dL — AB
Spec Grav, UA: 1.01 (ref 1.010–1.025)
Urobilinogen, UA: 0.2 E.U./dL
pH, UA: 5 (ref 5.0–8.0)

## 2017-11-22 LAB — POCT GLYCOSYLATED HEMOGLOBIN (HGB A1C): Hemoglobin A1C: 9.2 % — AB (ref 4.0–5.6)

## 2017-11-22 MED ORDER — SULFAMETHOXAZOLE-TRIMETHOPRIM 800-160 MG PO TABS: 1.0000 | ORAL_TABLET | Freq: Two times a day (BID) | ORAL | 0 refills | Status: DC

## 2017-11-22 MED ORDER — GLUCOSE BLOOD VI STRP: ORAL_STRIP | 12 refills | Status: DC

## 2017-11-22 MED ORDER — GLIPIZIDE 10 MG PO TABS: 10.0000 mg | ORAL_TABLET | Freq: Two times a day (BID) | ORAL | 2 refills | Status: DC

## 2017-11-22 MED ORDER — METFORMIN HCL 500 MG PO TABS: 1000.0000 mg | ORAL_TABLET | Freq: Two times a day (BID) | ORAL | 2 refills | Status: DC

## 2017-11-22 MED ORDER — INSULIN GLARGINE 100 UNIT/ML ~~LOC~~ SOLN: 50.0000 [IU] | Freq: Every day | SUBCUTANEOUS | 11 refills | Status: DC

## 2017-11-22 MED FILL — glipiZIDE 10 MG TABS: 10 | 30 days supply | Qty: 60 | Fill #0

## 2017-11-22 MED FILL — metFORMIN HCL 500 MG TABS: 500 | 30 days supply | Qty: 120 | Fill #0

## 2017-11-22 MED FILL — SULFAMETHOXAZOLE-TMP DS TAB: 800-160 | 7 days supply | Qty: 14 | Fill #0

## 2017-11-22 MED FILL — $LANTUS 100 UNITS/ML VIAL: 100 | 20 days supply | Qty: 10 | Fill #0

## 2017-11-22 MED FILL — TRUE METRIX TEST STRIP: 30 days supply | Qty: 100 | Fill #0

## 2017-11-22 NOTE — Progress Notes (Signed)
Follow Up  Subjective:    Patient ID: Wanda Weaver, female    DOB: 02/01/1961, 57 y.o.   MRN: [id number]   Chief Complaint  Patient presents with  . Follow-up    diabetes, HTN   HPI  Ms. Buelow has a past medical history of Hypertension, and Diabetes. She is here today for follow up.  Current Status: Since her last office visit, she is doing well with no complaints. She denies increased thirst, frequent urination, hunger, fatigue, blurred vision, excessive hunger, excessive thirst, weight gain, weight loss, and poor wound healing.   She denies fevers, chills, recent infections, weight loss, and night sweats. She has not had any headaches, visual changes, dizziness, and falls. No chest pain, heart palpitations, cough and shortness of breath reported. No reports of GI problems such as nausea, vomiting, diarrhea, and constipation. She has no reports of blood in stools, dysuria and hematuria. No depression or anxiety, and denies suicidal ideations, homicidal ideations, or auditory hallucinations. She denies pain today.     Past Medical History:  Diagnosis Date  . Diabetes mellitus without complication (HCC)   . Hypertension     Family History  Problem Relation Age of Onset  . Diabetes Mother   . Dementia Mother   . Diabetes Father   . Hypertension Father     Social History   Socioeconomic History  . Marital status: Single    Spouse name: Not on file  . Number of children: Not on file  . Years of education: Not on file  . Highest education level: Not on file  Occupational History  . Not on file  Social Needs  . Financial resource strain: Not on file  . Food insecurity:    Worry: Not on file    Inability: Not on file  . Transportation needs:    Medical: Not on file    Non-medical: Not on file  Tobacco Use  . Smoking status: Never Smoker  . Smokeless tobacco: Never Used  Substance and Sexual Activity  . Alcohol use: No  . Drug use: No  . Sexual activity: Not on file   Lifestyle  . Physical activity:    Days per week: Not on file    Minutes per session: Not on file  . Stress: Not on file  Relationships  . Social connections:    Talks on phone: Not on file    Gets together: Not on file    Attends religious service: Not on file    Active member of club or organization: Not on file    Attends meetings of clubs or organizations: Not on file    Relationship status: Not on file  . Intimate partner violence:    Fear of current or ex partner: Not on file    Emotionally abused: Not on file    Physically abused: Not on file    Forced sexual activity: Not on file  Other Topics Concern  . Not on file  Social History Narrative  . Not on file    Past Surgical History:  Procedure Laterality Date  . APPENDECTOMY    . CHOLECYSTECTOMY    . WRIST SURGERY Bilateral     Immunization History  Administered Date(s) Administered  . Pneumococcal Polysaccharide-23 08/06/2017    No Known Allergies  BP 140/82 (BP Location: Left Arm, Patient Position: Sitting, Cuff Size: Large)   Pulse 94   Temp 98.5 F (36.9 C) (Oral)   Ht 5\' 2"  (1.575 m)   Wt  189 lb (85.7 kg)   SpO2 98%   BMI 34.57 kg/m    Review of Systems  Constitutional: Negative.   HENT: Negative.   Eyes: Negative.   Respiratory: Negative.   Cardiovascular: Negative.   Gastrointestinal: Positive for abdominal distention (Obese).  Endocrine: Negative.   Genitourinary: Negative.   Musculoskeletal: Negative.   Skin: Negative.   Allergic/Immunologic: Negative.   Neurological: Negative.   Hematological: Negative.   Psychiatric/Behavioral: Negative.    Objective:   Physical Exam  Constitutional: She is oriented to person, place, and time. She appears well-developed and well-nourished.  HENT:  Head: Normocephalic and atraumatic.  Right Ear: External ear normal.  Left Ear: External ear normal.  Nose: Nose normal.  Mouth/Throat: Oropharynx is clear and moist.  Eyes: Pupils are equal,  round, and reactive to light. Conjunctivae and EOM are normal.  Neck: Normal range of motion. Neck supple.  Cardiovascular: Normal rate, regular rhythm, normal heart sounds and intact distal pulses.  Pulmonary/Chest: Effort normal and breath sounds normal.  Abdominal: Soft. Bowel sounds are normal. She exhibits distension.  Musculoskeletal: Normal range of motion.  Neurological: She is alert and oriented to person, place, and time.  Skin: Skin is warm and dry.  Psychiatric: She has a normal mood and affect. Her behavior is normal. Judgment and thought content normal.     Assessment & Plan:   1. Type 2 diabetes mellitus with complication, with long-term current use of insulin (HCC) Improved. Hgb A1c is 9.2 today, decreased from 9.7 on 10/22/2017.  She will continue to decrease foods/beverages high in sugars and carbs and follow Heart Healthy or DASH diet. Increase physical activity to at least 30 minutes cardio exercise daily.   - POCT glycosylated hemoglobin (Hb A1C) - POCT urinalysis dipstick - glipiZIDE (GLUCOTROL) 10 MG tablet; Take 1 tablet (10 mg total) by mouth 2 (two) times daily before a meal.  Dispense: 60 tablet; Refill: 2 - metFORMIN (GLUCOPHAGE) 500 MG tablet; Take 2 tablets (1,000 mg total) by mouth 2 (two) times daily with a meal.  Dispense: 180 tablet; Refill: 2 - insulin glargine (LANTUS) 100 UNIT/ML injection; Inject 0.5 mLs (50 Units total) into the skin at bedtime.  Dispense: 10 mL; Refill: 11 - glucose blood (ACCU-CHEK AVIVA) test strip; Use as instructed  Dispense: 100 each; Refill: 12  2. Essential hypertension Blood pressure is stable at 140/82 today. She will continue  Lisinopril as prescribed. She will continue to decrease high sodium intake, excessive alcohol intake, increase potassium intake, smoking cessation, and increase physical activity of at least 30 minutes of cardio activity daily. She will continue to follow Heart Healthy or DASH diet.  3. Urinary tract  infection with hematuria, site unspecified - sulfamethoxazole-trimethoprim (BACTRIM DS,SEPTRA DS) 800-160 MG tablet; Take 1 tablet by mouth 2 (two) times daily.  Dispense: 14 tablet; Refill: 0  4. Abnormal urinalysis - Urine Culture  5. Follow up She will follow up in 3 months.   Meds ordered this encounter  Medications  . sulfamethoxazole-trimethoprim (BACTRIM DS,SEPTRA DS) 800-160 MG tablet    Sig: Take 1 tablet by mouth 2 (two) times daily.    Dispense:  14 tablet    Refill:  0  . glipiZIDE (GLUCOTROL) 10 MG tablet    Sig: Take 1 tablet (10 mg total) by mouth 2 (two) times daily before a meal.    Dispense:  60 tablet    Refill:  2  . metFORMIN (GLUCOPHAGE) 500 MG tablet    Sig: Take  2 tablets (1,000 mg total) by mouth 2 (two) times daily with a meal.    Dispense:  180 tablet    Refill:  2  . insulin glargine (LANTUS) 100 UNIT/ML injection    Sig: Inject 0.5 mLs (50 Units total) into the skin at bedtime.    Dispense:  10 mL    Refill:  11  . glucose blood (ACCU-CHEK AVIVA) test strip    Sig: Use as instructed    Dispense:  100 each    Refill:  12   Raliegh IpNatalie Casia Corti,  MSN, FNP-C [facility] [address]  , [place] [address] [phone]  Increased Lantus to 50 units QHS.

## 2017-11-22 NOTE — Patient Instructions (Signed)
Sulfamethoxazole; Trimethoprim, SMX-TMP oral suspension What is this medicine? SULFAMETHOXAZOLE; TRIMETHOPRIM or SMX-TMP (suhl fuh meth OK suh zohl; trye METH oh prim) is a combination of a sulfonamide antibiotic and a second antibiotic, trimethoprim. It is used to treat or prevent certain kinds of bacterial infections.It will not work for colds, flu, or other viral infections. This medicine may be used for other purposes; ask your health care provider or pharmacist if you have questions. COMMON BRAND NAME(S): Septra, Sulfatrim, Sulfatrim Pediatric, Sultrex Pediatric What should I tell my health care provider before I take this medicine? They need to know if you have any of these conditions: -anemia -asthma -being treated with anticonvulsants -if you frequently drink alcohol containing drinks -kidney disease -liver disease -low level of folic acid or glucose-6-phosphate dehydrogenase -poor nutrition or malabsorption -porphyria -severe allergies -thyroid disorder -an unusual or allergic reaction to sulfamethoxazole, trimethoprim, sulfa drugs, other medicines, foods, dyes, or preservatives -pregnant or trying to get pregnant -breast-feeding How should I use this medicine? Take this suspension by mouth. Follow the directions on the prescription label. Shake the bottle well before taking. Use a specially marked spoon or container to measure your medicine. Ask your pharmacist if you do not have one. Household spoons are not accurate. Take your doses at regular intervals. Do not take more medicine than directed. Talk to your pediatrician regarding the use of this medicine in children. Special care may be needed. While this drug may be prescribed for children as young as 2 months of age for selected conditions, precautions do apply. Overdosage: If you think you have taken too much of this medicine contact a poison control center or emergency room at once. NOTE: This medicine is only for you. Do not  share this medicine with others. What if I miss a dose? If you miss a dose, take it as soon as you can. If it is almost time for your next dose, take only that dose. Do not take double or extra doses. What may interact with this medicine? Do not take this medicine with any of the following medications -aminobenzoate potassium -dofetilide -metronidazole This medicine may also interact with the following medications -ACE inhibitors like benazepril, enalapril, lisinopril, and ramipril -birth control pills -cyclosporine -digoxin -diuretics -indomethacin -medicines for diabetes -methenamine -methotrexate -phenytoin -potassium supplements -pyrimethamine -sulfinpyrazone -tricyclic antidepressants -warfarin This list may not describe all possible interactions. Give your health care provider a list of all the medicines, herbs, non-prescription drugs, or dietary supplements you use. Also tell them if you smoke, drink alcohol, or use illegal drugs. Some items may interact with your medicine. What should I watch for while using this medicine? Tell your doctor or health care professional if your symptoms do not improve. Drink several glasses of water a day to reduce the risk of kidney problems. Do not treat diarrhea with over the counter products. Contact your doctor if you have diarrhea that lasts more than 2 days or if it is severe and watery. This medicine can make you more sensitive to the sun. Keep out of the sun. If you cannot avoid being in the sun, wear protective clothing and use a sunscreen. Do not use sun lamps or tanning beds/booths. What side effects may I notice from receiving this medicine? Side effects that you should report to your doctor or health care professional as soon as possible: -allergic reactions like skin rash or hives, swelling of the face, lips, or tongue -breathing problems -fever or chills, sore throat -irregular heartbeat, chest pain -  joint or muscle pain -pain  or difficulty passing urine -red pinpoint spots on skin -redness, blistering, peeling or loosening of the skin, including inside the mouth -unusual bleeding or bruising -unusual weakness or tiredness -yellowing of the eyes or skin Side effects that usually do not require medical attention (report to your doctor or health care professional if they continue or are bothersome): -diarrhea -dizziness -headache -loss of appetite -nausea, vomiting -nervousness This list may not describe all possible side effects. Call your doctor for medical advice about side effects. You may report side effects to FDA at 1-800-FDA-1088. Where should I keep my medicine? Keep out of the reach of children. Store at room temperature between 15 and 25 degrees C (59 and 77 degrees F). Protect from light and moisture. Throw away any unused medicine after the expiration date. NOTE: This sheet is a summary. It may not cover all possible information. If you have questions about this medicine, talk to your doctor, pharmacist, or health care provider.  2018 Elsevier/Gold Standard (2012-11-04 14:37:40)  

## 2017-11-24 LAB — URINE CULTURE

## 2017-12-08 MED FILL — LISINOPRIL 40 MG TABLET: 40 | 30 days supply | Qty: 30 | Fill #1

## 2017-12-20 ENCOUNTER — Ambulatory Visit: Payer: Self-pay | Admitting: Family Medicine

## 2017-12-29 ENCOUNTER — Encounter: Payer: Self-pay | Admitting: Family Medicine

## 2017-12-29 ENCOUNTER — Ambulatory Visit (INDEPENDENT_AMBULATORY_CARE_PROVIDER_SITE_OTHER): Payer: Self-pay | Admitting: Family Medicine

## 2017-12-29 VITALS — BP 144/72 | HR 82 | Temp 97.8°F | Ht 62.0 in | Wt 192.0 lb

## 2017-12-29 DIAGNOSIS — E118 Type 2 diabetes mellitus with unspecified complications: Secondary | ICD-10-CM

## 2017-12-29 DIAGNOSIS — I10 Essential (primary) hypertension: Secondary | ICD-10-CM

## 2017-12-29 DIAGNOSIS — N390 Urinary tract infection, site not specified: Secondary | ICD-10-CM

## 2017-12-29 DIAGNOSIS — Z794 Long term (current) use of insulin: Secondary | ICD-10-CM

## 2017-12-29 DIAGNOSIS — Z09 Encounter for follow-up examination after completed treatment for conditions other than malignant neoplasm: Secondary | ICD-10-CM

## 2017-12-29 DIAGNOSIS — R319 Hematuria, unspecified: Secondary | ICD-10-CM

## 2017-12-29 LAB — POCT URINALYSIS DIP (MANUAL ENTRY)
Bilirubin, UA: NEGATIVE
Glucose, UA: 100 mg/dL — AB
Ketones, POC UA: NEGATIVE mg/dL
Nitrite, UA: NEGATIVE
Spec Grav, UA: 1.02 (ref 1.010–1.025)
Urobilinogen, UA: 0.2 E.U./dL
pH, UA: 5.5 (ref 5.0–8.0)

## 2017-12-29 LAB — POCT GLYCOSYLATED HEMOGLOBIN (HGB A1C): Hemoglobin A1C: 9.3 % — AB (ref 4.0–5.6)

## 2017-12-29 MED ORDER — SULFAMETHOXAZOLE-TRIMETHOPRIM 800-160 MG PO TABS: 1.0000 | ORAL_TABLET | Freq: Two times a day (BID) | ORAL | 0 refills | Status: DC

## 2017-12-29 MED FILL — SULFAMETHOXAZOLE-TMP DS TAB: 800-160 | 14 days supply | Qty: 14 | Fill #0

## 2017-12-29 NOTE — Patient Instructions (Signed)
Sulfamethoxazole; Trimethoprim, SMX-TMP tablets What is this medicine? SULFAMETHOXAZOLE; TRIMETHOPRIM or SMX-TMP (suhl fuh meth OK suh zohl; trye METH oh prim) is a combination of a sulfonamide antibiotic and a second antibiotic, trimethoprim. It is used to treat or prevent certain kinds of bacterial infections. It will not work for colds, flu, or other viral infections. This medicine may be used for other purposes; ask your health care provider or pharmacist if you have questions. COMMON BRAND NAME(S): Bacter-Aid DS, Bactrim, Bactrim DS, Septra, Septra DS What should I tell my health care provider before I take this medicine? They need to know if you have any of these conditions: -anemia -asthma -being treated with anticonvulsants -if you frequently drink alcohol containing drinks -kidney disease -liver disease -low level of folic acid or glucose-6-phosphate dehydrogenase -poor nutrition or malabsorption -porphyria -severe allergies -thyroid disorder -an unusual or allergic reaction to sulfamethoxazole, trimethoprim, sulfa drugs, other medicines, foods, dyes, or preservatives -pregnant or trying to get pregnant -breast-feeding How should I use this medicine? Take this medicine by mouth with a full glass of water. Follow the directions on the prescription label. Take your medicine at regular intervals. Do not take it more often than directed. Do not skip doses or stop your medicine early. Talk to your pediatrician regarding the use of this medicine in children. Special care may be needed. This medicine has been used in children as young as 2 months of age. Overdosage: If you think you have taken too much of this medicine contact a poison control center or emergency room at once. NOTE: This medicine is only for you. Do not share this medicine with others. What if I miss a dose? If you miss a dose, take it as soon as you can. If it is almost time for your next dose, take only that dose. Do  not take double or extra doses. What may interact with this medicine? Do not take this medicine with any of the following medications: -aminobenzoate potassium -dofetilide -metronidazole This medicine may also interact with the following medications: -ACE inhibitors like benazepril, enalapril, lisinopril, and ramipril -birth control pills -cyclosporine -digoxin -diuretics -indomethacin -medicines for diabetes -methenamine -methotrexate -phenytoin -potassium supplements -pyrimethamine -sulfinpyrazone -tricyclic antidepressants -warfarin This list may not describe all possible interactions. Give your health care provider a list of all the medicines, herbs, non-prescription drugs, or dietary supplements you use. Also tell them if you smoke, drink alcohol, or use illegal drugs. Some items may interact with your medicine. What should I watch for while using this medicine? Tell your doctor or health care professional if your symptoms do not improve. Drink several glasses of water a day to reduce the risk of kidney problems. Do not treat diarrhea with over the counter products. Contact your doctor if you have diarrhea that lasts more than 2 days or if it is severe and watery. This medicine can make you more sensitive to the sun. Keep out of the sun. If you cannot avoid being in the sun, wear protective clothing and use a sunscreen. Do not use sun lamps or tanning beds/booths. What side effects may I notice from receiving this medicine? Side effects that you should report to your doctor or health care professional as soon as possible: -allergic reactions like skin rash or hives, swelling of the face, lips, or tongue -breathing problems -fever or chills, sore throat -irregular heartbeat, chest pain -joint or muscle pain -pain or difficulty passing urine -red pinpoint spots on skin -redness, blistering, peeling or loosening of   the skin, including inside the mouth -unusual bleeding or  bruising -unusually weak or tired -yellowing of the eyes or skin Side effects that usually do not require medical attention (report to your doctor or health care professional if they continue or are bothersome): -diarrhea -dizziness -headache -loss of appetite -nausea, vomiting -nervousness This list may not describe all possible side effects. Call your doctor for medical advice about side effects. You may report side effects to FDA at 1-800-FDA-1088. Where should I keep my medicine? Keep out of the reach of children. Store at room temperature between 20 to 25 degrees C (68 to 77 degrees F). Protect from light. Throw away any unused medicine after the expiration date. NOTE: This sheet is a summary. It may not cover all possible information. If you have questions about this medicine, talk to your doctor, pharmacist, or health care provider.  2018 Elsevier/Gold Standard (2012-11-04 14:38:26)  

## 2017-12-29 NOTE — Progress Notes (Signed)
Follow Up  Subjective:    Patient ID: Wanda Weaver, female    DOB: [birth year], 57 y.o.   MRN: [id number]   Chief Complaint  Patient presents with  . Dizziness    HPI  Ms. Barba is a 57 year old female with a past medical history of Hypertension and Diabetes. She is here today for follow up.   Current Status: Since her last office visit, she is doing well with c/o dizziness and lightheadedness. She has began a new job. She states that she has not been taking Diabetic medications as prescribed.   She denies fevers, chills, fatigue, recent infections, weight loss, and night sweats. She has not had any headaches, visual changes, and falls. No chest pain, heart palpitations, cough and shortness of breath reported. No reports of GI problems such as nausea, vomiting, diarrhea, and constipation. She has no reports of blood in stools, dysuria and hematuria. No depression or anxiety, and denies suicidal ideations, homicidal ideations, or auditory hallucinations. She denies pain today.   Past Medical History:  Diagnosis Date  . Diabetes mellitus without complication ([place])   . Hypertension     Family History  Problem Relation Age of Onset  . Diabetes Mother   . Dementia Mother   . Diabetes Father   . Hypertension Father     Social History   Socioeconomic History  . Marital status: Single    Spouse name: Not on file  . Number of children: Not on file  . Years of education: Not on file  . Highest education level: Not on file  Occupational History  . Not on file  Social Needs  . Financial resource strain: Not on file  . Food insecurity:    Worry: Not on file    Inability: Not on file  . Transportation needs:    Medical: Not on file    Non-medical: Not on file  Tobacco Use  . Smoking status: Never Smoker  . Smokeless tobacco: Never Used  Substance and Sexual Activity  . Alcohol use: No  . Drug use: No  . Sexual activity: Not on file  Lifestyle  . Physical activity:    Days per  week: Not on file    Minutes per session: Not on file  . Stress: Not on file  Relationships  . Social connections:    Talks on phone: Not on file    Gets together: Not on file    Attends religious service: Not on file    Active member of club or organization: Not on file    Attends meetings of clubs or organizations: Not on file    Relationship status: Not on file  . Intimate partner violence:    Fear of current or ex partner: Not on file    Emotionally abused: Not on file    Physically abused: Not on file    Forced sexual activity: Not on file  Other Topics Concern  . Not on file  Social History Narrative  . Not on file    Past Surgical History:  Procedure Laterality Date  . APPENDECTOMY    . CHOLECYSTECTOMY    . WRIST SURGERY Bilateral     Immunization History  Administered Date(s) Administered  . Pneumococcal Polysaccharide-23 08/06/2017    Current Meds  Medication Sig  . blood glucose meter kit and supplies KIT Dispense based on patient and insurance preference.  . diclofenac (VOLTAREN) 75 MG EC tablet Take 1 tablet (75 mg total) by mouth at bedtime.  Marland Kitchen  glipiZIDE (GLUCOTROL) 10 MG tablet Take 1 tablet (10 mg total) by mouth 2 (two) times daily before a meal.  . glucose blood (ACCU-CHEK AVIVA) test strip Use as instructed  . HYDROcodone-acetaminophen (NORCO/VICODIN) 5-325 MG tablet Take 1 tablet by mouth every 6 (six) hours as needed.  Marland Kitchen ibuprofen (ADVIL,MOTRIN) 600 MG tablet Take 1 tablet (600 mg total) by mouth every 6 (six) hours as needed.  . insulin glargine (LANTUS) 100 UNIT/ML injection Inject 0.5 mLs (50 Units total) into the skin at bedtime.  . Insulin Pen Needle (BD ULTRA-FINE PEN NEEDLES) 29G X 12.7MM MISC ICD10 E11.9 for use with insulin administration  . Lancets 30G MISC 1 each by Does not apply route as directed.  Marland Kitchen lisinopril (PRINIVIL,ZESTRIL) 40 MG tablet Take 1 tablet (40 mg total) by mouth daily.  . metFORMIN (GLUCOPHAGE) 500 MG tablet Take 2  tablets (1,000 mg total) by mouth 2 (two) times daily with a meal.    No Known Allergies  BP (!) 144/72 (BP Location: Left Arm, Patient Position: Sitting, Cuff Size: Large)   Pulse 82   Temp 97.8 F (36.6 C) (Oral)   Ht '5\' 2"'$  (1.575 m)   Wt 192 lb (87.1 kg)   SpO2 100%   BMI 35.12 kg/m    Review of Systems  Constitutional: Negative.   HENT: Negative.   Eyes: Positive for visual disturbance (cataracts).  Respiratory: Negative.   Cardiovascular: Negative.   Gastrointestinal: Negative.   Endocrine: Negative.   Genitourinary: Negative.   Musculoskeletal: Negative.   Skin: Negative.   Allergic/Immunologic: Negative.   Neurological: Positive for dizziness and light-headedness.  Hematological: Negative.   Psychiatric/Behavioral: Negative.    Objective:   Physical Exam  Constitutional: She appears well-developed and well-nourished.  HENT:  Head: Normocephalic and atraumatic.  Right Ear: External ear normal.  Left Ear: External ear normal.  Nose: Nose normal.  Mouth/Throat: Oropharynx is clear and moist.  Eyes: Pupils are equal, round, and reactive to light. Conjunctivae and EOM are normal.  cataracts  Neck: Normal range of motion. Neck supple.  Cardiovascular: Normal rate, regular rhythm, normal heart sounds and intact distal pulses.  Pulmonary/Chest: Effort normal and breath sounds normal.  Abdominal: Bowel sounds are normal.  Musculoskeletal: Normal range of motion.  Neurological: She is alert.  Skin: Skin is warm and dry.   Assessment & Plan:   1. Type 2 diabetes mellitus with complication, with long-term current use of insulin (HCC) Hgb A1c is stable at 9.3 today. She will begin taking all medications as prescribed today. She will continue to decrease foods/beverages high in sugars and carbs and follow Heart Healthy or DASH diet. Increase physical activity to at least 30 minutes cardio exercise daily.   - POCT glycosylated hemoglobin (Hb A1C)  2. Essential  hypertension Antihypertensives are effective today. Blood pressure is 144/72 today. She will continue Voltaren and Lisinopril as prescribed. She will continue to decrease high sodium intake, excessive alcohol intake, increase potassium intake, smoking cessation, and increase physical activity of at least 30 minutes of cardio activity daily. She will continue to follow Heart Healthy or DASH diet.  3. Urinary tract infection with hematuria, site unspecified - sulfamethoxazole-trimethoprim (BACTRIM DS,SEPTRA DS) 800-160 MG tablet; Take 1 tablet by mouth 2 (two) times daily.  Dispense: 14 tablet; Refill: 0  4. Follow up She will follow up in 2 months.  - POCT urinalysis dipstick  Meds ordered this encounter  Medications  . sulfamethoxazole-trimethoprim (BACTRIM DS,SEPTRA DS) 800-160 MG tablet  Sig: Take 1 tablet by mouth 2 (two) times daily.    Dispense:  14 tablet    Refill:  0    Kathe Becton,  MSN, FNP-C [facility] [address] [place], [place] [address] [phone]

## 2018-01-19 ENCOUNTER — Ambulatory Visit: Payer: Self-pay | Admitting: Family Medicine

## 2018-01-19 MED FILL — !LANTUS 100 UNITS/ML VIAL: 100 | 20 days supply | Qty: 10 | Fill #1

## 2018-01-19 MED FILL — LISINOPRIL 40 MG TABLET: 40 | 30 days supply | Qty: 30 | Fill #2

## 2018-01-21 ENCOUNTER — Telehealth: Payer: Self-pay

## 2018-01-21 MED ORDER — "INSULIN SYRINGE-NEEDLE U-100 31G X 5/16"" 0.5 ML MISC": 0.5000 [IU] | Freq: Every day | 11 refills | Status: DC

## 2018-01-21 NOTE — Telephone Encounter (Signed)
Syringes sent

## 2018-01-31 ENCOUNTER — Telehealth: Payer: Self-pay

## 2018-01-31 NOTE — Telephone Encounter (Signed)
Patient states that she was having cough and sinus congestion. Patient was advise that she needs to come in for a office visit.

## 2018-02-01 ENCOUNTER — Ambulatory Visit (INDEPENDENT_AMBULATORY_CARE_PROVIDER_SITE_OTHER): Payer: Self-pay | Admitting: Family Medicine

## 2018-02-01 ENCOUNTER — Encounter: Payer: Self-pay | Admitting: Family Medicine

## 2018-02-01 VITALS — BP 144/80 | HR 92 | Temp 98.3°F | Ht 62.0 in | Wt 189.0 lb

## 2018-02-01 DIAGNOSIS — R059 Cough, unspecified: Secondary | ICD-10-CM

## 2018-02-01 DIAGNOSIS — J302 Other seasonal allergic rhinitis: Secondary | ICD-10-CM

## 2018-02-01 DIAGNOSIS — Z09 Encounter for follow-up examination after completed treatment for conditions other than malignant neoplasm: Secondary | ICD-10-CM

## 2018-02-01 DIAGNOSIS — R05 Cough: Secondary | ICD-10-CM

## 2018-02-01 DIAGNOSIS — R0981 Nasal congestion: Secondary | ICD-10-CM

## 2018-02-01 MED ORDER — BENZONATATE 100 MG PO CAPS: 100.0000 mg | ORAL_CAPSULE | Freq: Two times a day (BID) | ORAL | 0 refills | Status: DC | PRN

## 2018-02-01 MED ORDER — CETIRIZINE HCL 10 MG PO TABS: 10.0000 mg | ORAL_TABLET | Freq: Every day | ORAL | 2 refills | Status: DC

## 2018-02-01 MED ORDER — GUAIFENESIN ER 600 MG PO TB12: 600.0000 mg | ORAL_TABLET | Freq: Two times a day (BID) | ORAL | 1 refills | Status: DC

## 2018-02-01 MED FILL — BENZONATATE 100 MG CAP: 100 | 10 days supply | Qty: 20 | Fill #0

## 2018-02-01 NOTE — Progress Notes (Signed)
Sick Visit  Subjective:    Patient ID: Wanda Weaver, female    DOB: 05-20-1960, 57 y.o.   MRN: [id number]   Chief Complaint  Patient presents with  . Cough  . Nasal Congestion   HPI  Ms. Horlacher is a 57 year old female with a past medical history of Hypertension, and Diabetes. She is here today for a Sick Visit.   Current Status: Since her last office visit, she states that she been having cough, body aches, sinus congestion, and nausea for about 1 week now.   She denies fevers, chills, fatigue, recent infections, weight loss, and night sweats. She has not had any headaches, visual changes, dizziness, and falls. No chest pain, heart palpitations, cough and shortness of breath reported. No reports of GI problems such as nausea, vomiting, diarrhea, and constipation. She has no reports of blood in stools, dysuria and hematuria. No depression or anxiety reported. She denies pain today.   Past Medical History:  Diagnosis Date  . Diabetes mellitus without complication ([place])   . Hypertension     Family History  Problem Relation Age of Onset  . Diabetes Mother   . Dementia Mother   . Diabetes Father   . Hypertension Father     Social History   Socioeconomic History  . Marital status: Single    Spouse name: Not on file  . Number of children: Not on file  . Years of education: Not on file  . Highest education level: Not on file  Occupational History  . Not on file  Social Needs  . Financial resource strain: Not on file  . Food insecurity:    Worry: Not on file    Inability: Not on file  . Transportation needs:    Medical: Not on file    Non-medical: Not on file  Tobacco Use  . Smoking status: Never Smoker  . Smokeless tobacco: Never Used  Substance and Sexual Activity  . Alcohol use: No  . Drug use: No  . Sexual activity: Not on file  Lifestyle  . Physical activity:    Days per week: Not on file    Minutes per session: Not on file  . Stress: Not on file  Relationships  .  Social connections:    Talks on phone: Not on file    Gets together: Not on file    Attends religious service: Not on file    Active member of club or organization: Not on file    Attends meetings of clubs or organizations: Not on file    Relationship status: Not on file  . Intimate partner violence:    Fear of current or ex partner: Not on file    Emotionally abused: Not on file    Physically abused: Not on file    Forced sexual activity: Not on file  Other Topics Concern  . Not on file  Social History Narrative  . Not on file    Past Surgical History:  Procedure Laterality Date  . APPENDECTOMY    . CHOLECYSTECTOMY    . WRIST SURGERY Bilateral     Current Meds  Medication Sig  . blood glucose meter kit and supplies KIT Dispense based on patient and insurance preference.  . diclofenac (VOLTAREN) 75 MG EC tablet Take 1 tablet (75 mg total) by mouth at bedtime.  Marland Kitchen glipiZIDE (GLUCOTROL) 10 MG tablet Take 1 tablet (10 mg total) by mouth 2 (two) times daily before a meal.  . glucose blood (  ACCU-CHEK AVIVA) test strip Use as instructed  . HYDROcodone-acetaminophen (NORCO/VICODIN) 5-325 MG tablet Take 1 tablet by mouth every 6 (six) hours as needed.  Marland Kitchen ibuprofen (ADVIL,MOTRIN) 600 MG tablet Take 1 tablet (600 mg total) by mouth every 6 (six) hours as needed.  . insulin glargine (LANTUS) 100 UNIT/ML injection Inject 0.5 mLs (50 Units total) into the skin at bedtime.  . Insulin Pen Needle (BD ULTRA-FINE PEN NEEDLES) 29G X 12.7MM MISC ICD10 E11.9 for use with insulin administration  . Insulin Syringe-Needle U-100 (BD INSULIN SYRINGE ULTRAFINE) 31G X 5/16" 0.5 ML MISC 0.5 Units by Does not apply route daily.  . Lancets 30G MISC 1 each by Does not apply route as directed.  Marland Kitchen lisinopril (PRINIVIL,ZESTRIL) 40 MG tablet Take 1 tablet (40 mg total) by mouth daily.  . metFORMIN (GLUCOPHAGE) 500 MG tablet Take 2 tablets (1,000 mg total) by mouth 2 (two) times daily with a meal.  . [DISCONTINUED]  sulfamethoxazole-trimethoprim (BACTRIM DS,SEPTRA DS) 800-160 MG tablet Take 1 tablet by mouth 2 (two) times daily.    No Known Allergies  BP (!) 144/80 (BP Location: Left Arm, Patient Position: Sitting, Cuff Size: Large)   Pulse 92   Temp 98.3 F (36.8 C) (Oral)   Ht '5\' 2"'$  (1.575 m)   Wt 189 lb (85.7 kg)   SpO2 100%   BMI 34.57 kg/m   Review of Systems  Constitutional: Negative.   HENT: Negative.   Respiratory: Negative.   Cardiovascular: Negative.   Genitourinary: Negative.   Neurological: Positive for headaches (Occasional).   Objective:   Physical Exam  Constitutional: She is oriented to person, place, and time.  Neck: Normal range of motion.  Cardiovascular: Normal rate, regular rhythm, normal heart sounds and intact distal pulses.  Pulmonary/Chest: Effort normal and breath sounds normal.  Neurological: She is alert and oriented to person, place, and time.  Skin: Skin is warm.   Assessment & Plan:   1. Cough We will initiate Benzonatate today.  - benzonatate (TESSALON) 100 MG capsule; Take 1 capsule (100 mg total) by mouth 2 (two) times daily as needed for cough.  Dispense: 20 capsule; Refill: 0  2. Nasal congestion She will continue Mucinex today.  - guaiFENesin (MUCINEX) 600 MG 12 hr tablet; Take 1 tablet (600 mg total) by mouth 2 (two) times daily.  Dispense: 60 tablet; Refill: 1  3. Seasonal allergies We will initiate Zyrtec today. - cetirizine (ZYRTEC) 10 MG tablet; Take 1 tablet (10 mg total) by mouth daily.  Dispense: 30 tablet; Refill: 2  4. Follow up She will keep follow up appointment on 02/21/2018.   Meds ordered this encounter  Medications  . guaiFENesin (MUCINEX) 600 MG 12 hr tablet    Sig: Take 1 tablet (600 mg total) by mouth 2 (two) times daily.    Dispense:  60 tablet    Refill:  1  . cetirizine (ZYRTEC) 10 MG tablet    Sig: Take 1 tablet (10 mg total) by mouth daily.    Dispense:  30 tablet    Refill:  2  . benzonatate (TESSALON) 100  MG capsule    Sig: Take 1 capsule (100 mg total) by mouth 2 (two) times daily as needed for cough.    Dispense:  20 capsule    Refill:  0    Kathe Becton,  MSN, FNP-C [facility] [address] [place], [place] [address] [phone]

## 2018-02-01 NOTE — Patient Instructions (Signed)
Guaifenesin oral ER tablets What is this medicine? GUAIFENESIN (gwye FEN e sin) is an expectorant. It helps to thin mucous and make coughs more productive. This medicine is used to treat coughs caused by colds or the flu. It is not intended to treat chronic cough caused by smoking, asthma, emphysema, or heart failure. This medicine may be used for other purposes; ask your health care provider or pharmacist if you have questions. COMMON BRAND NAME(S): Humibid, Mucinex What should I tell my health care provider before I take this medicine? They need to know if you have any of these conditions: -fever -kidney disease -an unusual or allergic reaction to guaifenesin, other medicines, foods, dyes, or preservatives -pregnant or trying to get pregnant -breast-feeding How should I use this medicine? Take this medicine by mouth with a full glass of water. Follow the directions on the prescription label. Do not break, chew or crush this medicine. You may take with food or on an empty stomach. Take your medicine at regular intervals. Do not take your medicine more often than directed. Talk to your pediatrician regarding the use of this medicine in children. While this drug may be prescribed for children as young as 36 years old for selected conditions, precautions do apply. Overdosage: If you think you have taken too much of this medicine contact a poison control center or emergency room at once. NOTE: This medicine is only for you. Do not share this medicine with others. What if I miss a dose? If you miss a dose, take it as soon as you can. If it is almost time for your next dose, take only that dose. Do not take double or extra doses. What may interact with this medicine? Interactions are not expected. This list may not describe all possible interactions. Give your health care provider a list of all the medicines, herbs, non-prescription drugs, or dietary supplements you use. Also tell them if you smoke,  drink alcohol, or use illegal drugs. Some items may interact with your medicine. What should I watch for while using this medicine? Do not treat a cough for more than 1 week without consulting your doctor or health care professional. If you also have a high fever, skin rash, continuing headache, or sore throat, see your doctor. For best results, drink 6 to 8 glasses water daily while you are taking this medicine. What side effects may I notice from receiving this medicine? Side effects that you should report to your doctor or health care professional as soon as possible: -allergic reactions like skin rash, itching or hives, swelling of the face, lips, or tongue Side effects that usually do not require medical attention (report to your doctor or health care professional if they continue or are bothersome): -dizziness -headache -stomach upset This list may not describe all possible side effects. Call your doctor for medical advice about side effects. You may report side effects to FDA at 1-800-FDA-1088. Where should I keep my medicine? Keep out of the reach of children. Store at room temperature between 20 and 25 degrees C (68 and 77 degrees F). Keep container tightly closed. Throw away any unused medicine after the expiration date. NOTE: This sheet is a summary. It may not cover all possible information. If you have questions about this medicine, talk to your doctor, pharmacist, or health care provider.  2018 Elsevier/Gold Standard (2007-08-10 12:14:14)   Cetirizine tablets What is this medicine? CETIRIZINE (se TI ra zeen) is an antihistamine. This medicine is used to treat  or prevent symptoms of allergies. It is also used to help reduce itchy skin rash and hives. This medicine may be used for other purposes; ask your health care provider or pharmacist if you have questions. COMMON BRAND NAME(S): All Day Allergy, Zyrtec, Zyrtec Hives Relief What should I tell my health care provider before I  take this medicine? They need to know if you have any of these conditions: -kidney disease -liver disease -an unusual or allergic reaction to cetirizine, hydroxyzine, other medicines, foods, dyes, or preservatives -pregnant or trying to get pregnant -breast-feeding How should I use this medicine? Take this medicine by mouth with a glass of water. Follow the directions on the prescription label. You can take this medicine with food or on an empty stomach. Take your medicine at regular times. Do not take more often than directed. You may need to take this medicine for several days before your symptoms improve. Talk to your pediatrician regarding the use of this medicine in children. Special care may be needed. While this drug may be prescribed for children as young as 58 years of age for selected conditions, precautions do apply. Overdosage: If you think you have taken too much of this medicine contact a poison control center or emergency room at once. NOTE: This medicine is only for you. Do not share this medicine with others. What if I miss a dose? If you miss a dose, take it as soon as you can. If it is almost time for your next dose, take only that dose. Do not take double or extra doses. What may interact with this medicine? -alcohol -certain medicines for anxiety or sleep -narcotic medicines for pain -other medicines for colds or allergies This list may not describe all possible interactions. Give your health care provider a list of all the medicines, herbs, non-prescription drugs, or dietary supplements you use. Also tell them if you smoke, drink alcohol, or use illegal drugs. Some items may interact with your medicine. What should I watch for while using this medicine? Visit your doctor or health care professional for regular checks on your health. Tell your doctor if your symptoms do not improve. You may get drowsy or dizzy. Do not drive, use machinery, or do anything that needs mental  alertness until you know how this medicine affects you. Do not stand or sit up quickly, especially if you are an older patient. This reduces the risk of dizzy or fainting spells. Your mouth may get dry. Chewing sugarless gum or sucking hard candy, and drinking plenty of water may help. Contact your doctor if the problem does not go away or is severe. What side effects may I notice from receiving this medicine? Side effects that you should report to your doctor or health care professional as soon as possible: -allergic reactions like skin rash, itching or hives, swelling of the face, lips, or tongue -changes in vision or hearing -fast or irregular heartbeat -trouble passing urine or change in the amount of urine Side effects that usually do not require medical attention (report to your doctor or health care professional if they continue or are bothersome): -dizziness -dry mouth -irritability -sore throat -stomach pain -tiredness This list may not describe all possible side effects. Call your doctor for medical advice about side effects. You may report side effects to FDA at 1-800-FDA-1088. Where should I keep my medicine? Keep out of the reach of children. Store at room temperature between 15 and 30 degrees C (59 and 86 degrees F).  Throw away any unused medicine after the expiration date. NOTE: This sheet is a summary. It may not cover all possible information. If you have questions about this medicine, talk to your doctor, pharmacist, or health care provider.  2018 Elsevier/Gold Standard (2014-04-24 13:44:42) Benzonatate capsules What is this medicine? BENZONATATE (ben ZOE na tate) is used to treat cough. This medicine may be used for other purposes; ask your health care provider or pharmacist if you have questions. COMMON BRAND NAME(S): Tessalon Perles, Zonatuss What should I tell my health care provider before I take this medicine? They need to know if you have any of these  conditions: -kidney or liver disease -an unusual or allergic reaction to benzonatate, anesthetics, other medicines, foods, dyes, or preservatives -pregnant or trying to get pregnant -breast-feeding How should I use this medicine? Take this medicine by mouth with a glass of water. Follow the directions on the prescription label. Avoid breaking, chewing, or sucking the capsule, as this can cause serious side effects. Take your medicine at regular intervals. Do not take your medicine more often than directed. Talk to your pediatrician regarding the use of this medicine in children. While this drug may be prescribed for children as young as 54 years old for selected conditions, precautions do apply. Overdosage: If you think you have taken too much of this medicine contact a poison control center or emergency room at once. NOTE: This medicine is only for you. Do not share this medicine with others. What if I miss a dose? If you miss a dose, take it as soon as you can. If it is almost time for your next dose, take only that dose. Do not take double or extra doses. What may interact with this medicine? Do not take this medicine with any of the following medications: -MAOIs like Carbex, Eldepryl, Marplan, Nardil, and Parnate This list may not describe all possible interactions. Give your health care provider a list of all the medicines, herbs, non-prescription drugs, or dietary supplements you use. Also tell them if you smoke, drink alcohol, or use illegal drugs. Some items may interact with your medicine. What should I watch for while using this medicine? Tell your doctor if your symptoms do not improve or if they get worse. If you have a high fever, skin rash, or headache, see your health care professional. You may get drowsy or dizzy. Do not drive, use machinery, or do anything that needs mental alertness until you know how this medicine affects you. Do not sit or stand up quickly, especially if you are  an older patient. This reduces the risk of dizzy or fainting spells. What side effects may I notice from receiving this medicine? Side effects that you should report to your doctor or health care professional as soon as possible: -allergic reactions like skin rash, itching or hives, swelling of the face, lips, or tongue -breathing problems -chest pain -confusion or hallucinations -irregular heartbeat -numbness of mouth or throat -seizures Side effects that usually do not require medical attention (report to your doctor or health care professional if they continue or are bothersome): -burning feeling in the eyes -constipation -headache -nasal congestion -stomach upset This list may not describe all possible side effects. Call your doctor for medical advice about side effects. You may report side effects to FDA at 1-800-FDA-1088. Where should I keep my medicine? Keep out of the reach of children. Store at room temperature between 15 and 30 degrees C (59 and 86 degrees F). Keep tightly  closed. Protect from light and moisture. Throw away any unused medicine after the expiration date. NOTE: This sheet is a summary. It may not cover all possible information. If you have questions about this medicine, talk to your doctor, pharmacist, or health care provider.  2018 Elsevier/Gold Standard (2007-06-29 14:52:56)

## 2018-02-02 ENCOUNTER — Ambulatory Visit: Payer: Self-pay | Admitting: Family Medicine

## 2018-02-03 ENCOUNTER — Other Ambulatory Visit: Payer: Self-pay

## 2018-02-03 ENCOUNTER — Emergency Department (HOSPITAL_COMMUNITY)
Admission: EM | Admit: 2018-02-03 | Discharge: 2018-02-03 | Disposition: A | Discharge status: Home / Self Care | Payer: Medicaid Other | Attending: Emergency Medicine | Admitting: Emergency Medicine

## 2018-02-03 ENCOUNTER — Encounter (HOSPITAL_COMMUNITY): Payer: Self-pay

## 2018-02-03 DIAGNOSIS — Z794 Long term (current) use of insulin: Secondary | ICD-10-CM | POA: Insufficient documentation

## 2018-02-03 DIAGNOSIS — I10 Essential (primary) hypertension: Secondary | ICD-10-CM | POA: Insufficient documentation

## 2018-02-03 DIAGNOSIS — E119 Type 2 diabetes mellitus without complications: Secondary | ICD-10-CM | POA: Insufficient documentation

## 2018-02-03 DIAGNOSIS — Z79899 Other long term (current) drug therapy: Secondary | ICD-10-CM | POA: Insufficient documentation

## 2018-02-03 DIAGNOSIS — R0981 Nasal congestion: Secondary | ICD-10-CM | POA: Insufficient documentation

## 2018-02-03 DIAGNOSIS — J069 Acute upper respiratory infection, unspecified: Secondary | ICD-10-CM | POA: Insufficient documentation

## 2018-02-03 MED ORDER — AMOXICILLIN 500 MG PO CAPS: 500.0000 mg | ORAL_CAPSULE | Freq: Three times a day (TID) | ORAL | 0 refills | Status: DC

## 2018-02-03 MED FILL — AMOXICILLIN 500 MG CAPSULE: 500 | 7 days supply | Qty: 21 | Fill #0
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
## 2018-02-03 NOTE — ED Notes (Signed)
Patient given discharge teaching and verbalized understanding. Patient ambulated out of ED with a steady gait. 

## 2018-02-03 NOTE — ED Triage Notes (Signed)
Patient c/o nasal congestion and a non productive cough x 4 days. Patient states she saw her PCP 4 days ago and was prescribed Mucinex and robitussin, and Tessalon Perles. Patient states it has not helped.

## 2018-02-03 NOTE — Discharge Instructions (Addendum)
Your symptoms may be either a virus, bacterial infection or allergies.  We are sending a prescription antibiotic, to your pharmacy, to help in case it is a bacterial infection.  Make sure that you are drinking plenty of fluids especially water, 1 to 2 L a day.  Continue using the medications which you are taking, currently.  Also it will help to take Flonase nasal spray, one in each nostril twice a day for 2 to 3 weeks.  Follow-up with your PCP as needed for problems.

## 2018-02-06 ENCOUNTER — Other Ambulatory Visit: Payer: Self-pay

## 2018-02-06 ENCOUNTER — Emergency Department (HOSPITAL_COMMUNITY)
Admission: EM | Admit: 2018-02-06 | Discharge: 2018-02-06 | Disposition: A | Discharge status: Home / Self Care | Payer: Medicaid Other | Attending: Emergency Medicine | Admitting: Emergency Medicine

## 2018-02-06 ENCOUNTER — Encounter (HOSPITAL_COMMUNITY): Payer: Self-pay | Admitting: *Deleted

## 2018-02-06 ENCOUNTER — Emergency Department (HOSPITAL_COMMUNITY): Payer: Medicaid Other

## 2018-02-06 DIAGNOSIS — Z79899 Other long term (current) drug therapy: Secondary | ICD-10-CM | POA: Insufficient documentation

## 2018-02-06 DIAGNOSIS — E119 Type 2 diabetes mellitus without complications: Secondary | ICD-10-CM | POA: Insufficient documentation

## 2018-02-06 DIAGNOSIS — J40 Bronchitis, not specified as acute or chronic: Secondary | ICD-10-CM | POA: Insufficient documentation

## 2018-02-06 DIAGNOSIS — Z794 Long term (current) use of insulin: Secondary | ICD-10-CM | POA: Insufficient documentation

## 2018-02-06 DIAGNOSIS — I10 Essential (primary) hypertension: Secondary | ICD-10-CM | POA: Insufficient documentation

## 2018-02-06 MED ORDER — GUAIFENESIN-CODEINE 100-10 MG/5ML PO SYRP: 5.0000 mL | ORAL_SOLUTION | Freq: Three times a day (TID) | ORAL | 0 refills | Status: DC | PRN

## 2018-02-06 MED ORDER — DEXAMETHASONE 4 MG PO TABS
10.0000 mg | ORAL_TABLET | Freq: Once | ORAL | Status: AC
  Administered 2018-02-06: 10 mg via ORAL
  Filled 2018-02-06: qty 2

## 2018-02-06 MED ORDER — HYDROCOD POLST-CPM POLST ER 10-8 MG/5ML PO SUER
5.0000 mL | Freq: Once | ORAL | Status: AC
  Administered 2018-02-06: 5 mL via ORAL
  Filled 2018-02-06: qty 5

## 2018-02-06 MED ORDER — ALBUTEROL SULFATE HFA 108 (90 BASE) MCG/ACT IN AERS
2.0000 | INHALATION_SPRAY | RESPIRATORY_TRACT | Status: DC | PRN
  Administered 2018-02-06: 2 via RESPIRATORY_TRACT
  Filled 2018-02-06: qty 6.7

## 2018-02-06 MED ORDER — IPRATROPIUM-ALBUTEROL 0.5-2.5 (3) MG/3ML IN SOLN
3.0000 mL | Freq: Once | RESPIRATORY_TRACT | Status: AC
  Administered 2018-02-06: 3 mL via RESPIRATORY_TRACT
  Filled 2018-02-06: qty 3

## 2018-02-06 NOTE — ED Triage Notes (Signed)
Pt seen here for cough Thursday a.m., saw PCP on Wednesday, cough is worsening, has been taking abx but is not helping.
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
## 2018-02-08 ENCOUNTER — Ambulatory Visit (INDEPENDENT_AMBULATORY_CARE_PROVIDER_SITE_OTHER): Payer: Self-pay | Admitting: Family Medicine

## 2018-02-08 ENCOUNTER — Encounter: Payer: Self-pay | Admitting: Family Medicine

## 2018-02-08 VITALS — BP 142/70 | HR 90 | Temp 97.3°F | Ht 62.0 in | Wt 187.0 lb

## 2018-02-08 DIAGNOSIS — R05 Cough: Secondary | ICD-10-CM

## 2018-02-08 DIAGNOSIS — R059 Cough, unspecified: Secondary | ICD-10-CM

## 2018-02-08 DIAGNOSIS — R0981 Nasal congestion: Secondary | ICD-10-CM

## 2018-02-08 DIAGNOSIS — J069 Acute upper respiratory infection, unspecified: Secondary | ICD-10-CM

## 2018-02-08 DIAGNOSIS — R0602 Shortness of breath: Secondary | ICD-10-CM

## 2018-02-08 DIAGNOSIS — Z09 Encounter for follow-up examination after completed treatment for conditions other than malignant neoplasm: Secondary | ICD-10-CM

## 2018-02-08 MED ORDER — AMOXICILLIN-POT CLAVULANATE 875-125 MG PO TABS: 1.0000 | ORAL_TABLET | Freq: Two times a day (BID) | ORAL | 0 refills | Status: AC

## 2018-02-08 MED ORDER — ALBUTEROL SULFATE HFA 108 (90 BASE) MCG/ACT IN AERS: 2.0000 | INHALATION_SPRAY | Freq: Four times a day (QID) | RESPIRATORY_TRACT | 12 refills | Status: DC | PRN

## 2018-02-08 MED FILL — ALBUTEROL SULFATE HFA 108 (: 108 (90 BAS | 25 days supply | Qty: 18 | Fill #0

## 2018-02-08 MED FILL — AMOX-CLAV 875-125 MG TABLET: 875-125 | 10 days supply | Qty: 20 | Fill #0

## 2018-02-08 NOTE — Patient Instructions (Addendum)
Albuterol inhalation aerosol What is this medicine? ALBUTEROL (al BYOO ter ole) is a bronchodilator. It helps open up the airways in your lungs to make it easier to breathe. This medicine is used to treat and to prevent bronchospasm. This medicine may be used for other purposes; ask your health care provider or pharmacist if you have questions. COMMON BRAND NAME(S): Proair HFA, Proventil, Proventil HFA, Respirol, Ventolin, Ventolin HFA What should I tell my health care provider before I take this medicine? They need to know if you have any of the following conditions: -diabetes -heart disease or irregular heartbeat -high blood pressure -pheochromocytoma -seizures -thyroid disease -an unusual or allergic reaction to albuterol, levalbuterol, sulfites, other medicines, foods, dyes, or preservatives -pregnant or trying to get pregnant -breast-feeding How should I use this medicine? This medicine is for inhalation through the mouth. Follow the directions on your prescription label. Take your medicine at regular intervals. Do not use more often than directed. Make sure that you are using your inhaler correctly. Ask you doctor or health care provider if you have any questions. Talk to your pediatrician regarding the use of this medicine in children. Special care may be needed. Overdosage: If you think you have taken too much of this medicine contact a poison control center or emergency room at once. NOTE: This medicine is only for you. Do not share this medicine with others. What if I miss a dose? If you miss a dose, use it as soon as you can. If it is almost time for your next dose, use only that dose. Do not use double or extra doses. What may interact with this medicine? -anti-infectives like chloroquine and pentamidine -caffeine -cisapride -diuretics -medicines for colds -medicines for depression or for emotional or psychotic conditions -medicines for weight loss including some herbal  products -methadone -some antibiotics like clarithromycin, erythromycin, levofloxacin, and linezolid -some heart medicines -steroid hormones like dexamethasone, cortisone, hydrocortisone -theophylline -thyroid hormones This list may not describe all possible interactions. Give your health care provider a list of all the medicines, herbs, non-prescription drugs, or dietary supplements you use. Also tell them if you smoke, drink alcohol, or use illegal drugs. Some items may interact with your medicine. What should I watch for while using this medicine? Tell your doctor or health care professional if your symptoms do not improve. Do not use extra albuterol. If your asthma or bronchitis gets worse while you are using this medicine, call your doctor right away. If your mouth gets dry try chewing sugarless gum or sucking hard candy. Drink water as directed. What side effects may I notice from receiving this medicine? Side effects that you should report to your doctor or health care professional as soon as possible: -allergic reactions like skin rash, itching or hives, swelling of the face, lips, or tongue -breathing problems -chest pain -feeling faint or lightheaded, falls -high blood pressure -irregular heartbeat -fever -muscle cramps or weakness -pain, tingling, numbness in the hands or feet -vomiting Side effects that usually do not require medical attention (report to your doctor or health care professional if they continue or are bothersome): -cough -difficulty sleeping -headache -nervousness or trembling -stomach upset -stuffy or runny nose -throat irritation -unusual taste This list may not describe all possible side effects. Call your doctor for medical advice about side effects. You may report side effects to FDA at 1-800-FDA-1088. Where should I keep my medicine? Keep out of the reach of children. Store at room temperature between 15 and 30 degrees   C (59 and 86 degrees F). The  contents are under pressure and may burst when exposed to heat or flame. Do not freeze. This medicine does not work as well if it is too cold. Throw away any unused medicine after the expiration date. Inhalers need to be thrown away after the labeled number of puffs have been used or by the expiration date; whichever comes first. Ventolin HFA should be thrown away 12 months after removing from foil pouch. Check the instructions that come with your medicine. NOTE: This sheet is a summary. It may not cover all possible information. If you have questions about this medicine, talk to your doctor, pharmacist, or health care provider.  2018 Elsevier/Gold Standard (2012-09-15 10:57:17) Amoxicillin; Clavulanic Acid tablets What is this medicine? AMOXICILLIN; CLAVULANIC ACID (a mox i SIL in; KLAV yoo lan ic AS id) is a penicillin antibiotic. It is used to treat certain kinds of bacterial infections. It will not work for colds, flu, or other viral infections. This medicine may be used for other purposes; ask your health care provider or pharmacist if you have questions. COMMON BRAND NAME(S): Augmentin What should I tell my health care provider before I take this medicine? They need to know if you have any of these conditions: -bowel disease, like colitis -kidney disease -liver disease -mononucleosis -an unusual or allergic reaction to amoxicillin, penicillin, cephalosporin, other antibiotics, clavulanic acid, other medicines, foods, dyes, or preservatives -pregnant or trying to get pregnant -breast-feeding How should I use this medicine? Take this medicine by mouth with a full glass of water. Follow the directions on the prescription label. Take at the start of a meal. Do not crush or chew. If the tablet has a score line, you may cut it in half at the score line for easier swallowing. Take your medicine at regular intervals. Do not take your medicine more often than directed. Take all of your medicine as  directed even if you think you are better. Do not skip doses or stop your medicine early. Talk to your pediatrician regarding the use of this medicine in children. Special care may be needed. Overdosage: If you think you have taken too much of this medicine contact a poison control center or emergency room at once. NOTE: This medicine is only for you. Do not share this medicine with others. What if I miss a dose? If you miss a dose, take it as soon as you can. If it is almost time for your next dose, take only that dose. Do not take double or extra doses. What may interact with this medicine? -allopurinol -anticoagulants -birth control pills -methotrexate -probenecid This list may not describe all possible interactions. Give your health care provider a list of all the medicines, herbs, non-prescription drugs, or dietary supplements you use. Also tell them if you smoke, drink alcohol, or use illegal drugs. Some items may interact with your medicine. What should I watch for while using this medicine? Tell your doctor or health care professional if your symptoms do not improve. Do not treat diarrhea with over the counter products. Contact your doctor if you have diarrhea that lasts more than 2 days or if it is severe and watery. If you have diabetes, you may get a false-positive result for sugar in your urine. Check with your doctor or health care professional. Birth control pills may not work properly while you are taking this medicine. Talk to your doctor about using an extra method of birth control. What side effects  may I notice from receiving this medicine? Side effects that you should report to your doctor or health care professional as soon as possible: -allergic reactions like skin rash, itching or hives, swelling of the face, lips, or tongue -breathing problems -dark urine -fever or chills, sore throat -redness, blistering, peeling or loosening of the skin, including inside the  mouth -seizures -trouble passing urine or change in the amount of urine -unusual bleeding, bruising -unusually weak or tired -white patches or sores in the mouth or throat Side effects that usually do not require medical attention (report to your doctor or health care professional if they continue or are bothersome): -diarrhea -dizziness -headache -nausea, vomiting -stomach upset -vaginal or anal irritation This list may not describe all possible side effects. Call your doctor for medical advice about side effects. You may report side effects to FDA at 1-800-FDA-1088. Where should I keep my medicine? Keep out of the reach of children. Store at room temperature below 25 degrees C (77 degrees F). Keep container tightly closed. Throw away any unused medicine after the expiration date. NOTE: This sheet is a summary. It may not cover all possible information. If you have questions about this medicine, talk to your doctor, pharmacist, or health care provider.  2018 Elsevier/Gold Standard (2007-06-23 12:04:30)

## 2018-02-08 NOTE — Progress Notes (Signed)
Hospital Follow Up  Subjective:    Patient ID: Wanda Weaver, female    DOB: 1960-05-30, 57 y.o.   MRN: [id number]  HPI  [name] is a 57 year old female with a past medical history of Hypertension and Diabetes. She is here today for Sick Visit and Hospital Follow Up.    Current Status: Since her last office visit, she has had a ED visit on 02/06/2018 for Bronchitis. She was discharged with an antibiotic and Albuterol inhaler. CXR was negative for Cardiopulmonary disease. She continues to have nasal congestion and cough. She continues OTC medications with no relief. No chest pain, heart palpitations, and shortness of breath reported.  She denies fevers, chills, fatigue, recent infections, weight loss, and night sweats. She has not had any headaches, visual changes, dizziness, and falls.  No reports of GI problems such as nausea, vomiting, diarrhea, and constipation. She has no reports of blood in stools, dysuria and hematuria. No depression or anxiety reported. She denies pain today.   Review of Systems  Constitutional: Negative.   HENT: Positive for congestion (nasal).   Respiratory: Positive for cough.   Cardiovascular: Negative.   Gastrointestinal: Negative.   Genitourinary: Negative.   Musculoskeletal: Negative.    Objective:   Physical Exam  Constitutional: She is oriented to person, place, and time. She appears well-nourished.  Cardiovascular: Normal rate, regular rhythm, normal heart sounds and intact distal pulses.  Pulmonary/Chest: Effort normal and breath sounds normal.  Abdominal: Soft. Bowel sounds are normal.  Musculoskeletal: Normal range of motion.  Neurological: She is alert and oriented to person, place, and time.  Skin: Skin is warm and dry.  Psychiatric: She has a normal mood and affect. Her behavior is normal. Judgment and thought content normal.  Nursing note and vitals reviewed.  Assessment & Plan:   1. Viral upper respiratory tract infection We will initiate  Augmentin today.  - amoxicillin-clavulanate (AUGMENTIN) 875-125 MG tablet; Take 1 tablet by mouth 2 (two) times daily for 10 days.  Dispense: 20 tablet; Refill: 0  2. Shortness of breath - albuterol (PROVENTIL HFA;VENTOLIN HFA) 108 (90 Base) MCG/ACT inhaler; Inhale 2 puffs into the lungs every 6 (six) hours as needed for wheezing or shortness of breath.  Dispense: 1 Inhaler; Refill: 12  3. Cough Continue Mucinex and OTC cough suppressant as needed.   4. Nasal congestion  5. Follow up She will keep follow up appointment for 02/21/2018.  Meds ordered this encounter  Medications  . amoxicillin-clavulanate (AUGMENTIN) 875-125 MG tablet    Sig: Take 1 tablet by mouth 2 (two) times daily for 10 days.    Dispense:  20 tablet    Refill:  0  . albuterol (PROVENTIL HFA;VENTOLIN HFA) 108 (90 Base) MCG/ACT inhaler    Sig: Inhale 2 puffs into the lungs every 6 (six) hours as needed for wheezing or shortness of breath.    Dispense:  1 Inhaler    Refill:  12   Raliegh Ip,  MSN, [facility] Patient [facility] [address] [place], [place] [address] [phone]

## 2018-02-11 ENCOUNTER — Ambulatory Visit: Payer: Self-pay | Admitting: Family Medicine

## 2018-02-17 ENCOUNTER — Telehealth: Payer: Self-pay

## 2018-02-17 NOTE — Telephone Encounter (Signed)
Left a vm of appointment on 02/21/2018 at 9am.

## 2018-02-21 ENCOUNTER — Encounter: Payer: Self-pay | Admitting: Family Medicine

## 2018-02-21 ENCOUNTER — Ambulatory Visit (INDEPENDENT_AMBULATORY_CARE_PROVIDER_SITE_OTHER): Payer: Self-pay | Admitting: Family Medicine

## 2018-02-21 VITALS — BP 142/80 | HR 80 | Temp 98.1°F | Ht 62.0 in | Wt 187.0 lb

## 2018-02-21 DIAGNOSIS — R0602 Shortness of breath: Secondary | ICD-10-CM

## 2018-02-21 DIAGNOSIS — E118 Type 2 diabetes mellitus with unspecified complications: Secondary | ICD-10-CM

## 2018-02-21 DIAGNOSIS — J069 Acute upper respiratory infection, unspecified: Secondary | ICD-10-CM

## 2018-02-21 DIAGNOSIS — R059 Cough, unspecified: Secondary | ICD-10-CM

## 2018-02-21 DIAGNOSIS — F419 Anxiety disorder, unspecified: Secondary | ICD-10-CM

## 2018-02-21 DIAGNOSIS — Z09 Encounter for follow-up examination after completed treatment for conditions other than malignant neoplasm: Secondary | ICD-10-CM

## 2018-02-21 DIAGNOSIS — Z794 Long term (current) use of insulin: Secondary | ICD-10-CM

## 2018-02-21 DIAGNOSIS — I10 Essential (primary) hypertension: Secondary | ICD-10-CM

## 2018-02-21 DIAGNOSIS — R05 Cough: Secondary | ICD-10-CM

## 2018-02-21 LAB — POCT URINALYSIS DIP (MANUAL ENTRY)
Bilirubin, UA: NEGATIVE
Glucose, UA: 250 mg/dL — AB
Ketones, POC UA: NEGATIVE mg/dL
Nitrite, UA: NEGATIVE
Protein Ur, POC: NEGATIVE mg/dL
Spec Grav, UA: 1.015 (ref 1.010–1.025)
Urobilinogen, UA: 0.2 E.U./dL
pH, UA: 5.5 (ref 5.0–8.0)

## 2018-02-21 LAB — GLUCOSE, POCT (MANUAL RESULT ENTRY): POC Glucose: 243 mg/dl — AB (ref 70–99)

## 2018-02-21 MED ORDER — BUSPIRONE HCL 5 MG PO TABS: 5.0000 mg | ORAL_TABLET | Freq: Two times a day (BID) | ORAL | 2 refills | Status: DC

## 2018-02-21 MED FILL — busPIRone HCL 5 MG TABS: 5 | 30 days supply | Qty: 60 | Fill #0

## 2018-02-21 NOTE — Progress Notes (Signed)
Follow Up  Subjective:    Patient ID: Wanda Weaver, female    DOB: February 02, 1961, 57 y.o.   MRN: [id number]   Chief Complaint  Patient presents with  . Follow-up    Chronic condition    HPI  Wanda Weaver is a 57 year old female with a past medical history of Hypertension and Diabetes. She is here today for follow up.   Current Status: Since her last office visit, she is doing well with no complaints. She has occasional coughing, r/t recent upper respiratory infection. She is accompanied by a friend today. She denies increased thirst, frequent urination, hunger, fatigue, blurred vision, excessive hunger, excessive thirst, weight gain, weight loss, and poor wound healing. She denies visual changes, chest pain, cough, shortness of breath, heart palpitations, and falls. She has occasionally headaches and dizziness with position changes. Denies severe headaches, confusion, seizures, double vision, and blurred vision, nausea and vomiting.  She denies fevers, chills, recent infections, weight loss, and night sweats. She has not had any falls. No reports of GI problems such as nausea, vomiting, diarrhea, and constipation. She has no reports of blood in stools, dysuria and hematuria. No depression or anxiety reported. She denies pain today.   Past Medical History:  Diagnosis Date  . Diabetes mellitus without complication ([place])   . Hypertension     Family History  Problem Relation Age of Onset  . Diabetes Mother   . Dementia Mother   . Diabetes Father   . Hypertension Father     Social History   Socioeconomic History  . Marital status: Single    Spouse name: Not on file  . Number of children: Not on file  . Years of education: Not on file  . Highest education level: Not on file  Occupational History  . Not on file  Social Needs  . Financial resource strain: Not on file  . Food insecurity:    Worry: Not on file    Inability: Not on file  . Transportation needs:    Medical: Not on file   Non-medical: Not on file  Tobacco Use  . Smoking status: Never Smoker  . Smokeless tobacco: Never Used  Substance and Sexual Activity  . Alcohol use: No  . Drug use: No  . Sexual activity: Not on file  Lifestyle  . Physical activity:    Days per week: Not on file    Minutes per session: Not on file  . Stress: Not on file  Relationships  . Social connections:    Talks on phone: Not on file    Gets together: Not on file    Attends religious service: Not on file    Active member of club or organization: Not on file    Attends meetings of clubs or organizations: Not on file    Relationship status: Not on file  . Intimate partner violence:    Fear of current or ex partner: Not on file    Emotionally abused: Not on file    Physically abused: Not on file    Forced sexual activity: Not on file  Other Topics Concern  . Not on file  Social History Narrative  . Not on file    Past Surgical History:  Procedure Laterality Date  . APPENDECTOMY    . CARPAL TUNNEL RELEASE Bilateral   . CHOLECYSTECTOMY    . WRIST SURGERY Bilateral     Immunization History  Administered Date(s) Administered  . Pneumococcal Polysaccharide-23 08/06/2017  Current Meds  Medication Sig  . albuterol (PROVENTIL HFA;VENTOLIN HFA) 108 (90 Base) MCG/ACT inhaler Inhale 2 puffs into the lungs every 6 (six) hours as needed for wheezing or shortness of breath.  . blood glucose meter kit and supplies KIT Dispense based on patient and insurance preference.  . cetirizine (ZYRTEC) 10 MG tablet Take 1 tablet (10 mg total) by mouth daily.  . diclofenac (VOLTAREN) 75 MG EC tablet Take 1 tablet (75 mg total) by mouth at bedtime.  Marland Kitchen glipiZIDE (GLUCOTROL) 10 MG tablet Take 1 tablet (10 mg total) by mouth 2 (two) times daily before a meal.  . glucose blood (ACCU-CHEK AVIVA) test strip Use as instructed  . ibuprofen (ADVIL,MOTRIN) 600 MG tablet Take 1 tablet (600 mg total) by mouth every 6 (six) hours as needed.  .  insulin glargine (LANTUS) 100 UNIT/ML injection Inject 0.5 mLs (50 Units total) into the skin at bedtime.  . Insulin Pen Needle (BD ULTRA-FINE PEN NEEDLES) 29G X 12.7MM MISC ICD10 E11.9 for use with insulin administration  . Insulin Syringe-Needle U-100 (BD INSULIN SYRINGE ULTRAFINE) 31G X 5/16" 0.5 ML MISC 0.5 Units by Does not apply route daily.  . Lancets 30G MISC 1 each by Does not apply route as directed.  Marland Kitchen lisinopril (PRINIVIL,ZESTRIL) 40 MG tablet Take 1 tablet (40 mg total) by mouth daily.  . metFORMIN (GLUCOPHAGE) 500 MG tablet Take 2 tablets (1,000 mg total) by mouth 2 (two) times daily with a meal.    No Known Allergies  BP (!) 154/82 (BP Location: Left Arm, Patient Position: Sitting, Cuff Size: Large)   Pulse 80   Temp 98.1 F (36.7 C) (Oral)   Ht '5\' 2"'$  (1.575 m)   Wt 187 lb (84.8 kg)   SpO2 100%   BMI 34.20 kg/m    Review of Systems  Constitutional: Negative.   HENT: Negative.   Respiratory: Positive for cough (occasional) and shortness of breath (occasional).   Gastrointestinal: Negative.   Genitourinary: Negative.   Musculoskeletal: Negative.   Neurological: Positive for dizziness and headaches.  Psychiatric/Behavioral: Negative.    Objective:   Physical Exam  Constitutional: She is oriented to person, place, and time. She appears well-developed and well-nourished.  HENT:  Head: Normocephalic and atraumatic.  Right Ear: External ear normal.  Eyes: Pupils are equal, round, and reactive to light. Conjunctivae and EOM are normal.  Neck: Normal range of motion. Neck supple.  Cardiovascular: Normal rate, regular rhythm, normal heart sounds and intact distal pulses.  Pulmonary/Chest: Effort normal and breath sounds normal.  Abdominal: Soft. Bowel sounds are normal.  Musculoskeletal: Normal range of motion.  Neurological: She is alert and oriented to person, place, and time.  Skin: Skin is warm and dry.  Psychiatric: She has a normal mood and affect. Her behavior  is normal. Judgment and thought content normal.  Nursing note and vitals reviewed.  Assessment & Plan:   1. Type 2 diabetes mellitus with complication, with long-term current use of insulin (HCC) Blood glucose reading was at 243 today. Hgb A1c at 9.3 on 12/29/2017. Continue Metformin, Glucotrol, and Insulin as prescribed. She will continue to decrease foods/beverages high in sugars and carbs and follow Heart Healthy or DASH diet. Increase physical activity to at least 30 minutes cardio exercise daily.  - POCT urinalysis dipstick - POCT glucose (manual entry) - [facility] Diabetes Foot Exam  2. Anxiety Mild. Buspar is effective. She will continue Buspar as prescribed.  - busPIRone (BUSPAR) 5 MG tablet; Take 1 tablet (  5 mg total) by mouth 2 (two) times daily.  Dispense: 60 tablet; Refill: 2  3. Essential hypertension Blood pressure is 142/80. Continue Lisinopril as prescribed. She will continue to decrease high sodium intake, excessive alcohol intake, increase potassium intake, smoking cessation, and increase physical activity of at least 30 minutes of cardio activity daily. She will continue to follow Heart Healthy or DASH diet.  4. Cough Much improved.   5. Shortness of breath  6. Viral upper respiratory tract infection She has completed antibiotics. Symptoms have improved.   7. Follow up She will follow up in 2 months.   Meds ordered this encounter  Medications  . busPIRone (BUSPAR) 5 MG tablet    Sig: Take 1 tablet (5 mg total) by mouth 2 (two) times daily.    Dispense:  60 tablet    Refill:  [place],  MSN, FNP-C [facility] [address] [place], [place] [address] [phone]

## 2018-02-21 NOTE — Patient Instructions (Signed)
Buspirone tablets What is this medicine? BUSPIRONE (byoo SPYE rone) is used to treat anxiety disorders. This medicine may be used for other purposes; ask your health care provider or pharmacist if you have questions. COMMON BRAND NAME(S): BuSpar What should I tell my health care provider before I take this medicine? They need to know if you have any of these conditions: -kidney or liver disease -an unusual or allergic reaction to buspirone, other medicines, foods, dyes, or preservatives -pregnant or trying to get pregnant -breast-feeding How should I use this medicine? Take this medicine by mouth with a glass of water. Follow the directions on the prescription label. You may take this medicine with or without food. To ensure that this medicine always works the same way for you, you should take it either always with or always without food. Take your doses at regular intervals. Do not take your medicine more often than directed. Do not stop taking except on the advice of your doctor or health care professional. Talk to your pediatrician regarding the use of this medicine in children. Special care may be needed. Overdosage: If you think you have taken too much of this medicine contact a poison control center or emergency room at once. NOTE: This medicine is only for you. Do not share this medicine with others. What if I miss a dose? If you miss a dose, take it as soon as you can. If it is almost time for your next dose, take only that dose. Do not take double or extra doses. What may interact with this medicine? Do not take this medicine with any of the following medications: -linezolid -MAOIs like Carbex, Eldepryl, Marplan, Nardil, and Parnate -methylene blue -procarbazine This medicine may also interact with the following medications: -diazepam -digoxin -diltiazem -erythromycin -grapefruit juice -haloperidol -medicines for mental depression or mood problems -medicines for seizures like  carbamazepine, phenobarbital and phenytoin -nefazodone -other medications for anxiety -rifampin -ritonavir -some antifungal medicines like itraconazole, ketoconazole, and voriconazole -verapamil -warfarin This list may not describe all possible interactions. Give your health care provider a list of all the medicines, herbs, non-prescription drugs, or dietary supplements you use. Also tell them if you smoke, drink alcohol, or use illegal drugs. Some items may interact with your medicine. What should I watch for while using this medicine? Visit your doctor or health care professional for regular checks on your progress. It may take 1 to 2 weeks before your anxiety gets better. You may get drowsy or dizzy. Do not drive, use machinery, or do anything that needs mental alertness until you know how this drug affects you. Do not stand or sit up quickly, especially if you are an older patient. This reduces the risk of dizzy or fainting spells. Alcohol can make you more drowsy and dizzy. Avoid alcoholic drinks. What side effects may I notice from receiving this medicine? Side effects that you should report to your doctor or health care professional as soon as possible: -blurred vision or other vision changes -chest pain -confusion -difficulty breathing -feelings of hostility or anger -muscle aches and pains -numbness or tingling in hands or feet -ringing in the ears -skin rash and itching -vomiting -weakness Side effects that usually do not require medical attention (report to your doctor or health care professional if they continue or are bothersome): -disturbed dreams, nightmares -headache -nausea -restlessness or nervousness -sore throat and nasal congestion -stomach upset This list may not describe all possible side effects. Call your doctor for medical advice about side   effects. You may report side effects to FDA at 1-800-FDA-1088. Where should I keep my medicine? Keep out of the reach  of children. Store at room temperature below 30 degrees C (86 degrees F). Protect from light. Keep container tightly closed. Throw away any unused medicine after the expiration date. NOTE: This sheet is a summary. It may not cover all possible information. If you have questions about this medicine, talk to your doctor, pharmacist, or health care provider.  2018 Elsevier/Gold Standard (2009-11-07 18:06:11)  

## 2018-02-25 MED FILL — metFORMIN HCL 500 MG TABS: 500 | 30 days supply | Qty: 120 | Fill #1

## 2018-02-25 MED FILL — TRUE METRIX TEST STRIP: 30 days supply | Qty: 100 | Fill #1

## 2018-03-22 ENCOUNTER — Telehealth: Payer: Self-pay

## 2018-03-22 NOTE — Telephone Encounter (Signed)
Patient was calling to see what she can do regarding her job not giving her breaks. Patient was advise to contact human resources at her job.

## 2018-03-25 MED FILL — LISINOPRIL 40 MG TABLET: 40 | 30 days supply | Qty: 30 | Fill #3

## 2018-04-25 ENCOUNTER — Encounter: Payer: Self-pay | Admitting: Family Medicine

## 2018-04-25 ENCOUNTER — Ambulatory Visit (INDEPENDENT_AMBULATORY_CARE_PROVIDER_SITE_OTHER): Payer: Self-pay | Admitting: Family Medicine

## 2018-04-25 VITALS — BP 146/76 | HR 84 | Temp 98.0°F | Ht 62.0 in | Wt 186.0 lb

## 2018-04-25 DIAGNOSIS — R8290 Unspecified abnormal findings in urine: Secondary | ICD-10-CM

## 2018-04-25 DIAGNOSIS — M79671 Pain in right foot: Secondary | ICD-10-CM

## 2018-04-25 DIAGNOSIS — E66811 Obesity, class 1: Secondary | ICD-10-CM | POA: Insufficient documentation

## 2018-04-25 DIAGNOSIS — E6609 Other obesity due to excess calories: Secondary | ICD-10-CM

## 2018-04-25 DIAGNOSIS — E118 Type 2 diabetes mellitus with unspecified complications: Secondary | ICD-10-CM

## 2018-04-25 DIAGNOSIS — Z09 Encounter for follow-up examination after completed treatment for conditions other than malignant neoplasm: Secondary | ICD-10-CM

## 2018-04-25 DIAGNOSIS — F419 Anxiety disorder, unspecified: Secondary | ICD-10-CM

## 2018-04-25 DIAGNOSIS — Z794 Long term (current) use of insulin: Secondary | ICD-10-CM

## 2018-04-25 DIAGNOSIS — I10 Essential (primary) hypertension: Secondary | ICD-10-CM

## 2018-04-25 LAB — GLUCOSE, POCT (MANUAL RESULT ENTRY): POC Glucose: 267 mg/dl — AB (ref 70–99)

## 2018-04-25 LAB — POCT URINALYSIS DIP (MANUAL ENTRY)
Bilirubin, UA: NEGATIVE
Glucose, UA: 500 mg/dL — AB
Ketones, POC UA: NEGATIVE mg/dL
Nitrite, UA: NEGATIVE
Protein Ur, POC: NEGATIVE mg/dL
Spec Grav, UA: <=1.005 — AB (ref 1.010–1.025)
Urobilinogen, UA: 0.2 E.U./dL
pH, UA: 5.5 (ref 5.0–8.0)

## 2018-04-25 LAB — POCT GLYCOSYLATED HEMOGLOBIN (HGB A1C): Hemoglobin A1C: 9.3 % — AB (ref 4.0–5.6)

## 2018-04-25 MED FILL — !LANTUS 100 UNITS/ML VIAL: 100 | 20 days supply | Qty: 10 | Fill #2

## 2018-04-25 MED FILL — TRUEPLUS SYR 0.5ML 31GX5/16: 31G X 5/16" | 30 days supply | Qty: 100 | Fill #0

## 2018-04-25 NOTE — Progress Notes (Signed)
Follow Up  Subjective:    Patient ID: Wanda Weaver, female    DOB: [birth year], 58 y.o.   MRN: [id number]   Chief Complaint  Patient presents with  . Follow-up    chronic condition   . Foot Pain    burning right foot in the arch    HPI  Wanda Weaver is a 58 year old female with a past medical history of Hypertension, and Diabetes. She is here today for follow up.  Current Status: Since her last office visit, she states that she has mild pain in her right foot. Her normal range of preprandial blood glucose levels are between 106-260. She is currently taking 45 units of Lantus at night. She denies fatigue, frequent urination, excessive hunger, excessive thirst, weight gain, weight loss, and poor wound healing. She has occasionally headaches and dizziness with position changes. Denies severe headaches, confusion, seizures, double vision, and blurred vision, nausea and vomiting.  She denies fevers, chills, fatigue, recent infections, weight loss, and night sweats. She has not had any falls. No reports of GI problems such as diarrhea, and constipation. She has no reports of blood in stools, dysuria and hematuria. Her anxiety is moderate today. She has not been taking Buspar. She denies pain today.   Review of Systems  Constitutional: Negative.   HENT: Negative.   Eyes: Negative.   Respiratory: Negative.   Cardiovascular: Negative.   Gastrointestinal: Negative.   Endocrine: Negative.   Genitourinary: Negative.   Musculoskeletal: Negative.   Skin: Negative.   Allergic/Immunologic: Negative.   Neurological: Positive for dizziness and headaches.  Hematological: Negative.   Psychiatric/Behavioral: Negative.    Objective:   Physical Exam Vitals signs and nursing note reviewed.  Constitutional:      Appearance: Normal appearance. She is obese.  HENT:     Head: Normocephalic and atraumatic.     Right Ear: External ear normal.     Left Ear: External ear normal.     Nose: Nose normal.   Mouth/Throat:     Mouth: Mucous membranes are moist.     Pharynx: Oropharynx is clear.  Eyes:     Conjunctiva/sclera: Conjunctivae normal.  Neck:     Musculoskeletal: Normal range of motion and neck supple.  Cardiovascular:     Rate and Rhythm: Normal rate and regular rhythm.     Pulses: Normal pulses.     Heart sounds: Normal heart sounds.  Abdominal:     General: Bowel sounds are normal. There is distension.     Palpations: Abdomen is soft.  Musculoskeletal: Normal range of motion.  Skin:    General: Skin is warm and dry.     Capillary Refill: Capillary refill takes less than 2 seconds.  Neurological:     General: No focal deficit present.     Mental Status: She is alert and oriented to person, place, and time.  Psychiatric:        Mood and Affect: Mood normal.        Behavior: Behavior normal.        Thought Content: Thought content normal.        Judgment: Judgment normal.    Assessment & Plan:   1. Type 2 diabetes mellitus with complication, with long-term current use of insulin (HCC) Hgb is stable at 9.3 today. She will continue to decrease foods/beverages high in sugars and carbs and follow Heart Healthy or DASH diet. Increase physical activity to at least 30 minutes cardio exercise daily. Continue Lantus, Glipizide, and  Metformin as prescribed. She will continue to decrease foods/beverages high in sugars and carbs and follow Heart Healthy or DASH diet. Increase physical activity to at least 30 minutes cardio exercise daily.  - POCT urinalysis dipstick - POCT glycosylated hemoglobin (Hb A1C) - POCT glucose (manual entry)  2. Essential hypertension Blood pressure is stable at 146/76 today. She will continue Lisinopril as prescribed. She will continue to decrease high sodium intake, excessive alcohol intake, increase potassium intake, smoking cessation, and increase physical activity of at least 30 minutes of cardio activity daily. She will continue to follow Heart Healthy  or DASH diet.  3. Class 1 obesity due to excess calories with serious comorbidity in adult, unspecified BMI Body mass index is 34.02 kg/m.  Goal BMI  is <30. Encouraged efforts to reduce weight include engaging in physical activity as tolerated with goal of 150 minutes per week. Improve dietary choices and eat a meal regimen consistent with a Mediterranean or DASH diet. Reduce simple carbohydrates. Do not skip meals and eat healthy snacks throughout the day to avoid over-eating at dinner. Set a goal weight loss that is achievable for you.  4. Right foot pain Continue to take Diclofenac as needed.   5. Anxiety She will restart Buspar today.   6. Abnormal urinalysis Results are pending.  - Urine Culture  7. Follow up She will follow up in 3 months.   No orders of the defined types were placed in this encounter.  Raliegh IpNatalie Babe Clenney,  MSN, FNP-C [facility] [address]  [place], [place] [id number] [phone]

## 2018-04-27 LAB — URINE CULTURE

## 2018-05-17 MED FILL — LISINOPRIL 40 MG TABLET: 40 | 30 days supply | Qty: 30 | Fill #4

## 2018-05-31 MED FILL — metFORMIN HCL 500 MG TABS: 500 | 30 days supply | Qty: 120 | Fill #2

## 2018-06-01 ENCOUNTER — Encounter: Payer: Self-pay | Admitting: Family Medicine

## 2018-06-01 ENCOUNTER — Ambulatory Visit (INDEPENDENT_AMBULATORY_CARE_PROVIDER_SITE_OTHER): Payer: Self-pay | Admitting: Family Medicine

## 2018-06-01 VITALS — BP 140/68 | HR 92 | Temp 98.0°F | Ht 62.0 in | Wt 184.0 lb

## 2018-06-01 DIAGNOSIS — R6889 Other general symptoms and signs: Secondary | ICD-10-CM

## 2018-06-01 DIAGNOSIS — Z09 Encounter for follow-up examination after completed treatment for conditions other than malignant neoplasm: Secondary | ICD-10-CM

## 2018-06-01 DIAGNOSIS — I10 Essential (primary) hypertension: Secondary | ICD-10-CM

## 2018-06-01 DIAGNOSIS — F419 Anxiety disorder, unspecified: Secondary | ICD-10-CM

## 2018-06-01 DIAGNOSIS — E118 Type 2 diabetes mellitus with unspecified complications: Secondary | ICD-10-CM

## 2018-06-01 DIAGNOSIS — Z794 Long term (current) use of insulin: Secondary | ICD-10-CM

## 2018-06-01 DIAGNOSIS — Z0000 Encounter for general adult medical examination without abnormal findings: Secondary | ICD-10-CM

## 2018-06-01 LAB — POCT GLYCOSYLATED HEMOGLOBIN (HGB A1C): Hemoglobin A1C: 10 % — AB (ref 4.0–5.6)

## 2018-06-01 NOTE — Progress Notes (Signed)
Patient [facility]  Established Patient Office Visit  Subjective:  Patient ID: Myleka Moncure, female    DOB: Feb 28, 1961  Age: 57 y.o. MRN: [id number]  CC:  Chief Complaint  Patient presents with  . Cough  . Headache  . Emesis  . Fatigue  . Epistaxis   HPI Liadan Guizar is a 34 female who presents for follow up of chronic diseases today.   Past Medical History:  Diagnosis Date  . Diabetes mellitus without complication ([place])   . Hypertension    Current Status: Since her last office visit, she states that she has been having cough, fever, nasal congestion, headaches, laryngitis, X 5 days. She has been taking OTC sinus/cold medicine. Her normal range of preprandial blood glucose levels are between 97-137. She denies fatigue, frequent urination, blurred vision, excessive hunger, excessive thirst, weight gain, weight loss, and poor wound healing. She denies visual changes, chest pain, cough, shortness of breath, heart palpitations, and falls. She has occasional headaches and dizziness with position changes. Denies severe headaches, confusion, seizures, double vision, and blurred vision, nausea and vomiting. Her anxiety is mild today. She denies suicidal ideations, homicidal ideations, or auditory hallucinations.  She denies fevers, chills, recent infections, weight loss, and night sweats. No reports of GI problems such as diarrhea, and constipation. She has no reports of blood in stools, dysuria and hematuria. She denies pain today.   Past Surgical History:  Procedure Laterality Date  . APPENDECTOMY    . CARPAL TUNNEL RELEASE Bilateral   . CHOLECYSTECTOMY    . WRIST SURGERY Bilateral     Family History  Problem Relation Age of Onset  . Diabetes Mother   . Dementia Mother   . Diabetes Father   . Hypertension Father     Social History   Socioeconomic History  . Marital status: Single    Spouse name: Not on file  . Number of children: Not on  file  . Years of education: Not on file  . Highest education level: Not on file  Occupational History  . Not on file  Social Needs  . Financial resource strain: Not on file  . Food insecurity:    Worry: Not on file    Inability: Not on file  . Transportation needs:    Medical: Not on file    Non-medical: Not on file  Tobacco Use  . Smoking status: Never Smoker  . Smokeless tobacco: Never Used  Substance and Sexual Activity  . Alcohol use: No  . Drug use: No  . Sexual activity: Not on file  Lifestyle  . Physical activity:    Days per week: Not on file    Minutes per session: Not on file  . Stress: Not on file  Relationships  . Social connections:    Talks on phone: Not on file    Gets together: Not on file    Attends religious service: Not on file    Active member of club or organization: Not on file    Attends meetings of clubs or organizations: Not on file    Relationship status: Not on file  . Intimate partner violence:    Fear of current or ex partner: Not on file    Emotionally abused: Not on file    Physically abused: Not on file    Forced sexual activity: Not on file  Other Topics Concern  . Not on file  Social History Narrative  . Not on file  Outpatient Medications Prior to Visit  Medication Sig Dispense Refill  . albuterol (PROVENTIL HFA;VENTOLIN HFA) 108 (90 Base) MCG/ACT inhaler Inhale 2 puffs into the lungs every 6 (six) hours as needed for wheezing or shortness of breath. 1 Inhaler 12  . blood glucose meter kit and supplies KIT Dispense based on patient and insurance preference. 100 each 12  . busPIRone (BUSPAR) 5 MG tablet Take 1 tablet (5 mg total) by mouth 2 (two) times daily. 60 tablet 2  . cetirizine (ZYRTEC) 10 MG tablet Take 1 tablet (10 mg total) by mouth daily. 30 tablet 2  . diclofenac (VOLTAREN) 75 MG EC tablet Take 1 tablet (75 mg total) by mouth at bedtime. 30 tablet 1  . glipiZIDE (GLUCOTROL) 10 MG tablet Take 1 tablet (10 mg total) by  mouth 2 (two) times daily before a meal. 60 tablet 2  . glucose blood (ACCU-CHEK AVIVA) test strip Use as instructed 100 each 12  . insulin glargine (LANTUS) 100 UNIT/ML injection Inject 0.5 mLs (50 Units total) into the skin at bedtime. 10 mL 11  . Insulin Pen Needle (BD ULTRA-FINE PEN NEEDLES) 29G X 12.7MM MISC ICD10 E11.9 for use with insulin administration 200 each 2  . Insulin Syringe-Needle U-100 (BD INSULIN SYRINGE ULTRAFINE) 31G X 5/16" 0.5 ML MISC 0.5 Units by Does not apply route daily. 10 each 11  . Lancets 30G MISC 1 each by Does not apply route as directed. 100 each 12  . lisinopril (PRINIVIL,ZESTRIL) 40 MG tablet Take 1 tablet (40 mg total) by mouth daily. 90 tablet 3  . metFORMIN (GLUCOPHAGE) 500 MG tablet Take 2 tablets (1,000 mg total) by mouth 2 (two) times daily with a meal. 180 tablet 2   No facility-administered medications prior to visit.     No Known Allergies  ROS Review of Systems  Constitutional: Positive for fatigue and fever.  HENT: Positive for congestion (nasal).   Eyes: Negative.   Respiratory: Positive for cough and shortness of breath (Occasional ).   Cardiovascular: Negative.   Endocrine: Negative.   Genitourinary: Negative.   Musculoskeletal: Negative.   Skin: Negative.   Allergic/Immunologic: Negative.   Neurological: Positive for dizziness and headaches.  Hematological: Negative.   Psychiatric/Behavioral: Negative.    Objective:    Physical Exam  Constitutional: She is oriented to person, place, and time. She appears well-developed and well-nourished.  HENT:  Head: Normocephalic and atraumatic.  Eyes: Conjunctivae are normal.  Neck: Normal range of motion. Neck supple.  Cardiovascular: Normal rate, regular rhythm, normal heart sounds and intact distal pulses.  Pulmonary/Chest: Breath sounds normal.  Abdominal: Soft. Bowel sounds are normal.  Musculoskeletal: Normal range of motion.  Neurological: She is alert and oriented to person,  place, and time.  Skin: Skin is warm and dry.  Psychiatric: She has a normal mood and affect. Her behavior is normal. Judgment and thought content normal.  Nursing note and vitals reviewed.   BP 140/68 (BP Location: Left Arm, Patient Position: Sitting, Cuff Size: Large)   Pulse 92   Temp 98 F (36.7 C) (Oral)   Ht '5\' 2"'$  (1.575 m)   Wt 184 lb (83.5 kg)   SpO2 100%   BMI 33.65 kg/m  Wt Readings from Last 3 Encounters:  06/01/18 184 lb (83.5 kg)  04/25/18 186 lb (84.4 kg)  02/21/18 187 lb (84.8 kg)     Health Maintenance Due  Topic Date Due  . TETANUS/TDAP  09/07/1980  . MAMMOGRAM  09/08/2011  . COLONOSCOPY  09/08/2011    There are no preventive care reminders to display for this patient.  Lab Results  Component Value Date   TSH 1.640 06/01/2018   Lab Results  Component Value Date   WBC 7.9 06/01/2018   HGB 12.6 06/01/2018   HCT 37.2 06/01/2018   MCV 82 06/01/2018   PLT 249 06/01/2018   Lab Results  Component Value Date   NA 140 06/01/2018   K 4.2 06/01/2018   CO2 22 06/01/2018   GLUCOSE 248 (H) 06/01/2018   BUN 12 06/01/2018   CREATININE 0.67 06/01/2018   BILITOT 0.4 06/01/2018   ALKPHOS 95 06/01/2018   AST 17 06/01/2018   ALT 21 06/01/2018   PROT 6.4 06/01/2018   ALBUMIN 4.0 06/01/2018   CALCIUM 9.1 06/01/2018   Lab Results  Component Value Date   CHOL 231 (H) 06/01/2018   Lab Results  Component Value Date   HDL 43 06/01/2018   Lab Results  Component Value Date   LDLCALC 156 (H) 06/01/2018   Lab Results  Component Value Date   TRIG 159 (H) 06/01/2018   Lab Results  Component Value Date   CHOLHDL 5.4 (H) 06/01/2018   Lab Results  Component Value Date   HGBA1C 10.0 (A) 06/01/2018   Assessment & Plan:   1. Flu-like symptoms She will continue OTC cold/flu medication, increase liquids, and get plenty of rest. Report to office signs and symptoms do not improve or worsen.   2. Essential hypertension Blood pressure is stable at 140/68  today. Continue antihypertensive medications as prescribed. She will continue to decrease high sodium intake, excessive alcohol intake, increase potassium intake, smoking cessation, and increase physical activity of at least 30 minutes of cardio activity daily. She will continue to follow Heart Healthy or DASH diet. - CBC with Differential - Comprehensive metabolic panel - TSH - Lipid Panel - Vitamin D, 25-hydroxy - Vitamin B12  3. Type 2 diabetes mellitus with complication, with long-term current use of insulin (HCC) Continue Glucotrol, Insulin, Metformin. She will continue to decrease foods/beverages high in sugars and carbs and follow Heart Healthy or DASH diet. Increase physical activity to at least 30 minutes cardio exercise daily.  - CBC with Differential - Comprehensive metabolic panel - TSH - Lipid Panel - Vitamin D, 25-hydroxy - Vitamin B12 - POCT glycosylated hemoglobin (Hb A1C)  4. Anxiety She will continue Buspar as prescribed.   5. Healthcare maintenance - CBC with Differential - Comprehensive metabolic panel - TSH - Lipid Panel - Vitamin D, 25-hydroxy - Vitamin B12  6. Follow up She will keep previously scheduled follow up appointment.   No orders of the defined types were placed in this encounter.  Orders Placed This Encounter  Procedures  . CBC with Differential  . Comprehensive metabolic panel  . TSH  . Lipid Panel  . Vitamin D, 25-hydroxy  . Vitamin B12  . POCT glycosylated hemoglobin (Hb A1C)   Referral Orders  No referral(s) requested today   Kathe Becton,  MSN, FNP-C [facility] [place], [place] [address] [phone]   Problem List Items Addressed This Visit      Cardiovascular and Mediastinum   Essential hypertension   Relevant Orders   CBC with Differential (Completed)   Comprehensive metabolic panel (Completed)   TSH (Completed)   Lipid Panel (Completed)   Vitamin D,  25-hydroxy (Completed)   Vitamin B12 (Completed)     Endocrine   Type 2  diabetes mellitus with complication, with long-term current use of insulin (HCC)   Relevant Orders   CBC with Differential (Completed)   Comprehensive metabolic panel (Completed)   TSH (Completed)   Lipid Panel (Completed)   Vitamin D, 25-hydroxy (Completed)   Vitamin B12 (Completed)   POCT glycosylated hemoglobin (Hb A1C) (Completed)     Other   Anxiety    Other Visit Diagnoses    Flu-like symptoms    -  Primary   Healthcare maintenance       Relevant Orders   CBC with Differential (Completed)   Comprehensive metabolic panel (Completed)   TSH (Completed)   Lipid Panel (Completed)   Vitamin D, 25-hydroxy (Completed)   Vitamin B12 (Completed)   Follow up          No orders of the defined types were placed in this encounter.   Follow-up: No follow-ups on file.    Azzie Glatter, FNP

## 2018-06-02 ENCOUNTER — Encounter: Payer: Self-pay | Admitting: Family Medicine

## 2018-06-02 ENCOUNTER — Other Ambulatory Visit: Payer: Self-pay | Admitting: Family Medicine

## 2018-06-02 DIAGNOSIS — E785 Hyperlipidemia, unspecified: Secondary | ICD-10-CM

## 2018-06-02 DIAGNOSIS — E559 Vitamin D deficiency, unspecified: Secondary | ICD-10-CM

## 2018-06-02 LAB — CBC WITH DIFFERENTIAL/PLATELET
Basophils Absolute: 0.1 10*3/uL (ref 0.0–0.2)
Basos: 1 %
EOS (ABSOLUTE): 0.3 10*3/uL (ref 0.0–0.4)
Eos: 4 %
Hematocrit: 37.2 % (ref 34.0–46.6)
Hemoglobin: 12.6 g/dL (ref 11.1–15.9)
Immature Grans (Abs): 0 10*3/uL (ref 0.0–0.1)
Immature Granulocytes: 0 %
Lymphocytes Absolute: 1.6 10*3/uL (ref 0.7–3.1)
Lymphs: 20 %
MCH: 27.8 pg (ref 26.6–33.0)
MCHC: 33.9 g/dL (ref 31.5–35.7)
MCV: 82 fL (ref 79–97)
Monocytes Absolute: 0.5 10*3/uL (ref 0.1–0.9)
Monocytes: 6 %
Neutrophils Absolute: 5.5 10*3/uL (ref 1.4–7.0)
Neutrophils: 69 %
Platelets: 249 10*3/uL (ref 150–450)
RBC: 4.54 x10E6/uL (ref 3.77–5.28)
RDW: 13 % (ref 11.7–15.4)
WBC: 7.9 10*3/uL (ref 3.4–10.8)

## 2018-06-02 LAB — COMPREHENSIVE METABOLIC PANEL
ALT: 21 IU/L (ref 0–32)
AST: 17 IU/L (ref 0–40)
Albumin/Globulin Ratio: 1.7 (ref 1.2–2.2)
Albumin: 4 g/dL (ref 3.8–4.9)
Alkaline Phosphatase: 95 IU/L (ref 39–117)
BUN/Creatinine Ratio: 18 (ref 9–23)
BUN: 12 mg/dL (ref 6–24)
Bilirubin Total: 0.4 mg/dL (ref 0.0–1.2)
CO2: 22 mmol/L (ref 20–29)
Calcium: 9.1 mg/dL (ref 8.7–10.2)
Chloride: 103 mmol/L (ref 96–106)
Creatinine, Ser: 0.67 mg/dL (ref 0.57–1.00)
GFR calc Af Amer: 114 mL/min/{1.73_m2} (ref >59)
GFR calc non Af Amer: 99 mL/min/{1.73_m2} (ref >59)
Globulin, Total: 2.4 g/dL (ref 1.5–4.5)
Glucose: 248 mg/dL — ABNORMAL HIGH (ref 65–99)
Potassium: 4.2 mmol/L (ref 3.5–5.2)
Sodium: 140 mmol/L (ref 134–144)
Total Protein: 6.4 g/dL (ref 6.0–8.5)

## 2018-06-02 LAB — LIPID PANEL
Chol/HDL Ratio: 5.4 ratio — ABNORMAL HIGH (ref 0.0–4.4)
Cholesterol, Total: 231 mg/dL — ABNORMAL HIGH (ref 100–199)
HDL: 43 mg/dL (ref >39)
LDL Calculated: 156 mg/dL — ABNORMAL HIGH (ref 0–99)
Triglycerides: 159 mg/dL — ABNORMAL HIGH (ref 0–149)
VLDL Cholesterol Cal: 32 mg/dL (ref 5–40)

## 2018-06-02 LAB — TSH: TSH: 1.64 u[IU]/mL (ref 0.450–4.500)

## 2018-06-02 LAB — VITAMIN B12: Vitamin B-12: 461 pg/mL (ref 232–1245)

## 2018-06-02 LAB — VITAMIN D 25 HYDROXY (VIT D DEFICIENCY, FRACTURES): Vit D, 25-Hydroxy: 5 ng/mL — ABNORMAL LOW (ref 30.0–100.0)

## 2018-06-02 MED ORDER — VITAMIN D (ERGOCALCIFEROL) 1.25 MG (50000 UNIT) PO CAPS: 50000.0000 [IU] | ORAL_CAPSULE | ORAL | 3 refills | Status: DC

## 2018-06-02 MED ORDER — ATORVASTATIN CALCIUM 10 MG PO TABS: 10.0000 mg | ORAL_TABLET | Freq: Every day | ORAL | 3 refills | Status: DC

## 2018-06-20 MED FILL — !LANTUS 100 UNITS/ML VIAL: 100 | 20 days supply | Qty: 10 | Fill #3

## 2018-06-20 MED FILL — busPIRone HCL 5 MG TABS: 5 | 30 days supply | Qty: 60 | Fill #1

## 2018-06-20 MED FILL — LISINOPRIL 40 MG TABLET: 40 | 30 days supply | Qty: 30 | Fill #5

## 2018-07-21 MED FILL — LISINOPRIL 40 MG TABLET: 40 | 30 days supply | Qty: 30 | Fill #6

## 2018-07-25 ENCOUNTER — Ambulatory Visit: Payer: Self-pay | Admitting: Family Medicine

## 2018-07-28 MED FILL — metFORMIN HCL 500 MG TABS: 500 | 30 days supply | Qty: 120 | Fill #3

## 2018-08-15 ENCOUNTER — Ambulatory Visit (INDEPENDENT_AMBULATORY_CARE_PROVIDER_SITE_OTHER): Payer: Self-pay | Admitting: Family Medicine

## 2018-08-15 ENCOUNTER — Other Ambulatory Visit: Payer: Self-pay

## 2018-08-15 ENCOUNTER — Encounter: Payer: Self-pay | Admitting: Family Medicine

## 2018-08-15 VITALS — BP 140/66 | HR 84 | Temp 97.9°F | Ht 62.0 in | Wt 187.0 lb

## 2018-08-15 DIAGNOSIS — E559 Vitamin D deficiency, unspecified: Secondary | ICD-10-CM

## 2018-08-15 DIAGNOSIS — E785 Hyperlipidemia, unspecified: Secondary | ICD-10-CM

## 2018-08-15 DIAGNOSIS — I10 Essential (primary) hypertension: Secondary | ICD-10-CM

## 2018-08-15 DIAGNOSIS — Z09 Encounter for follow-up examination after completed treatment for conditions other than malignant neoplasm: Secondary | ICD-10-CM

## 2018-08-15 DIAGNOSIS — E118 Type 2 diabetes mellitus with unspecified complications: Secondary | ICD-10-CM

## 2018-08-15 DIAGNOSIS — R739 Hyperglycemia, unspecified: Secondary | ICD-10-CM | POA: Insufficient documentation

## 2018-08-15 DIAGNOSIS — F419 Anxiety disorder, unspecified: Secondary | ICD-10-CM

## 2018-08-15 DIAGNOSIS — Z794 Long term (current) use of insulin: Secondary | ICD-10-CM

## 2018-08-15 LAB — GLUCOSE, POCT (MANUAL RESULT ENTRY)
POC Glucose: 291 mg/dl — AB (ref 70–99)
POC Glucose: 278 mg/dl — AB (ref 70–99)

## 2018-08-15 LAB — POCT GLYCOSYLATED HEMOGLOBIN (HGB A1C): Hemoglobin A1C: 10.2 % — AB (ref 4.0–5.6)

## 2018-08-15 MED ORDER — INSULIN LISPRO 100 UNIT/ML ~~LOC~~ SOLN
10.0000 [IU] | Freq: Once | SUBCUTANEOUS | Status: AC
  Administered 2018-08-15: 10 [IU] via SUBCUTANEOUS

## 2018-08-15 MED ORDER — BUSPIRONE HCL 10 MG PO TABS: 10.0000 mg | ORAL_TABLET | Freq: Three times a day (TID) | ORAL | 3 refills | Status: DC

## 2018-08-15 MED ORDER — VITAMIN D (ERGOCALCIFEROL) 1.25 MG (50000 UNIT) PO CAPS: 50000.0000 [IU] | ORAL_CAPSULE | ORAL | 3 refills | Status: DC

## 2018-08-15 MED ORDER — GLUCOSE BLOOD VI STRP: ORAL_STRIP | 12 refills | Status: DC

## 2018-08-15 MED ORDER — ATORVASTATIN CALCIUM 10 MG PO TABS: 10.0000 mg | ORAL_TABLET | Freq: Every day | ORAL | 3 refills | Status: DC

## 2018-08-15 MED FILL — VIT D2 1.25 MG (50,000 UNIT: 1.25 MG | 34 days supply | Qty: 5 | Fill #0

## 2018-08-15 MED FILL — ATORVASTATIN 10 MG TABLET: 10 | 30 days supply | Qty: 30 | Fill #0

## 2018-08-15 MED FILL — busPIRone HCL 10 MG TABS: 10 | 20 days supply | Qty: 60 | Fill #0

## 2018-08-15 NOTE — Progress Notes (Signed)
[name]   Sick Visit  Subjective:  Patient ID: Wanda Weaver, female    DOB: Sep 08, 1960  Age: 58 y.o. MRN: [id number]  CC:  Chief Complaint  Patient presents with  . Follow-up    diabetes,sugar levels    HPI Wanda Weaver is a 58 year old female who presents for Sick Visit today.   Past Medical History:  Diagnosis Date  . Diabetes mellitus without complication ([place])   . Hyperlipidemia   . Hypertension   . Vitamin D deficiency    Current Status: Since her last office visit, she is doing well with no complaints. Her most recent normal range of preprandial blood glucose levels have been between 144-408. She admits to blurred vision. She denies fatigue, frequent urination, excessive hunger, excessive thirst, weight gain, weight loss, and poor wound healing. She denies visual changes, chest pain, cough, shortness of breath, heart palpitations, and falls. she has occasional headaches and dizziness with position changes. Denies severe headaches, confusion, seizures, double vision, and blurred vision, nausea and vomiting. Her anxiety is moderate today. She denies suicidal ideations, homicidal ideations, or auditory hallucinations.  She denies fevers, chills, recent infections, weight loss, and night sweats. She has not had any and falls. No chest pain, heart palpitations, cough and shortness of breath reported. No reports of GI problems such as diarrhea, and constipation. She has no reports of blood in stools, dysuria and hematuria. She denies pain today.   Past Surgical History:  Procedure Laterality Date  . APPENDECTOMY    . CARPAL TUNNEL RELEASE Bilateral   . CHOLECYSTECTOMY    . WRIST SURGERY Bilateral     Family History  Problem Relation Age of Onset  . Diabetes Mother   . Dementia Mother   . Diabetes Father   . Hypertension Father     Social History   Socioeconomic History  . Marital status: Single    Spouse name: Not on file   . Number of children: Not on file  . Years of education: Not on file  . Highest education level: Not on file  Occupational History  . Not on file  Social Needs  . Financial resource strain: Not on file  . Food insecurity:    Worry: Not on file    Inability: Not on file  . Transportation needs:    Medical: Not on file    Non-medical: Not on file  Tobacco Use  . Smoking status: Never Smoker  . Smokeless tobacco: Never Used  Substance and Sexual Activity  . Alcohol use: No  . Drug use: No  . Sexual activity: Not on file  Lifestyle  . Physical activity:    Days per week: Not on file    Minutes per session: Not on file  . Stress: Not on file  Relationships  . Social connections:    Talks on phone: Not on file    Gets together: Not on file    Attends religious service: Not on file    Active member of club or organization: Not on file    Attends meetings of clubs or organizations: Not on file    Relationship status: Not on file  . Intimate partner violence:    Fear of current or ex partner: Not on file    Emotionally abused: Not on file    Physically abused: Not on file    Forced sexual activity: Not on file  Other Topics Concern  . Not on  file  Social History Narrative  . Not on file    Outpatient Medications Prior to Visit  Medication Sig Dispense Refill  . albuterol (PROVENTIL HFA;VENTOLIN HFA) 108 (90 Base) MCG/ACT inhaler Inhale 2 puffs into the lungs every 6 (six) hours as needed for wheezing or shortness of breath. 1 Inhaler 12  . blood glucose meter kit and supplies KIT Dispense based on patient and insurance preference. 100 each 12  . cetirizine (ZYRTEC) 10 MG tablet Take 1 tablet (10 mg total) by mouth daily. 30 tablet 2  . diclofenac (VOLTAREN) 75 MG EC tablet Take 1 tablet (75 mg total) by mouth at bedtime. 30 tablet 1  . glipiZIDE (GLUCOTROL) 10 MG tablet Take 1 tablet (10 mg total) by mouth 2 (two) times daily before a meal. 60 tablet 2  . insulin glargine  (LANTUS) 100 UNIT/ML injection Inject 0.5 mLs (50 Units total) into the skin at bedtime. 10 mL 11  . Insulin Pen Needle (BD ULTRA-FINE PEN NEEDLES) 29G X 12.7MM MISC ICD10 E11.9 for use with insulin administration 200 each 2  . Insulin Syringe-Needle U-100 (BD INSULIN SYRINGE ULTRAFINE) 31G X 5/16" 0.5 ML MISC 0.5 Units by Does not apply route daily. 10 each 11  . Lancets 30G MISC 1 each by Does not apply route as directed. 100 each 12  . lisinopril (PRINIVIL,ZESTRIL) 40 MG tablet Take 1 tablet (40 mg total) by mouth daily. 90 tablet 3  . metFORMIN (GLUCOPHAGE) 500 MG tablet Take 2 tablets (1,000 mg total) by mouth 2 (two) times daily with a meal. 180 tablet 2  . atorvastatin (LIPITOR) 10 MG tablet Take 1 tablet (10 mg total) by mouth daily. 30 tablet 3  . busPIRone (BUSPAR) 5 MG tablet Take 1 tablet (5 mg total) by mouth 2 (two) times daily. 60 tablet 2  . glucose blood (ACCU-CHEK AVIVA) test strip Use as instructed 100 each 12  . Vitamin D, Ergocalciferol, (DRISDOL) 1.25 MG (50000 UT) CAPS capsule Take 1 capsule (50,000 Units total) by mouth every 7 (seven) days. 5 capsule 3   No facility-administered medications prior to visit.     No Known Allergies  ROS Review of Systems  Constitutional: Negative.   HENT: Negative.   Eyes: Negative.   Respiratory: Negative.   Cardiovascular: Negative.   Gastrointestinal: Negative.   Endocrine: Negative.   Genitourinary: Negative.   Musculoskeletal: Negative.   Skin: Negative.   Allergic/Immunologic: Negative.   Neurological: Positive for dizziness and headaches.  Hematological: Negative.   Psychiatric/Behavioral: Negative.       Objective:    Physical Exam  Constitutional: She is oriented to person, place, and time. She appears well-developed and well-nourished.  HENT:  Head: Normocephalic and atraumatic.  Eyes: Conjunctivae are normal.  Neck: Normal range of motion. Neck supple.  Cardiovascular: Normal rate, regular rhythm, normal  heart sounds and intact distal pulses.  Pulmonary/Chest: Effort normal and breath sounds normal.  Abdominal: Soft. Bowel sounds are normal.  Musculoskeletal: Normal range of motion.  Neurological: She is alert and oriented to person, place, and time. She has normal reflexes.  Skin: Skin is warm and dry.  Psychiatric: She has a normal mood and affect. Her behavior is normal. Judgment and thought content normal.  Nursing note and vitals reviewed.   BP 140/66 (BP Location: Left Arm, Patient Position: Sitting, Cuff Size: Large)   Pulse 84   Temp 97.9 F (36.6 C) (Oral)   Ht _0  (1.575 m)   Wt 187 lb (84.8  kg)   SpO2 100%   BMI 34.20 kg/m  Wt Readings from Last 3 Encounters:  08/15/18 187 lb (84.8 kg)  06/01/18 184 lb (83.5 kg)  04/25/18 186 lb (84.4 kg)     Health Maintenance Due  Topic Date Due  . TETANUS/TDAP  09/07/1980  . MAMMOGRAM  09/08/2011  . COLONOSCOPY  09/08/2011    There are no preventive care reminders to display for this patient.  Lab Results  Component Value Date   TSH 1.640 06/01/2018   Lab Results  Component Value Date   WBC 7.9 06/01/2018   HGB 12.6 06/01/2018   HCT 37.2 06/01/2018   MCV 82 06/01/2018   PLT 249 06/01/2018   Lab Results  Component Value Date   NA 140 06/01/2018   K 4.2 06/01/2018   CO2 22 06/01/2018   GLUCOSE 248 (H) 06/01/2018   BUN 12 06/01/2018   CREATININE 0.67 06/01/2018   BILITOT 0.4 06/01/2018   ALKPHOS 95 06/01/2018   AST 17 06/01/2018   ALT 21 06/01/2018   PROT 6.4 06/01/2018   ALBUMIN 4.0 06/01/2018   CALCIUM 9.1 06/01/2018   Lab Results  Component Value Date   CHOL 231 (H) 06/01/2018   Lab Results  Component Value Date   HDL 43 06/01/2018   Lab Results  Component Value Date   LDLCALC 156 (H) 06/01/2018   Lab Results  Component Value Date   TRIG 159 (H) 06/01/2018   Lab Results  Component Value Date   CHOLHDL 5.4 (H) 06/01/2018   Lab Results  Component Value Date   HGBA1C 10.2 (A) 08/15/2018       Assessment & Plan:   1. Type 2 diabetes mellitus with complication, with long-term current use of insulin (HCC) Hgb A1c is stable at 10.2 today. Continue Metformin, Glucotrol and Insulin as prescribed.  He will continue to decrease foods/beverages high in sugars and carbs and follow Heart Healthy or DASH diet. Increase physical activity to at least 30 minutes cardio exercise daily.  - POCT glycosylated hemoglobin (Hb A1C) - POCT glucose (manual entry) - Urinalysis - insulin lispro (HUMALOG) injection 10 Units - glucose blood (ACCU-CHEK AVIVA) test strip; Use as instructed  Dispense: 100 each; Refill: 12  2. Hyperglycemia Blood glucose elevated at 291 today. Continue anti-diabetic medications as prescribed.  She will continue to decrease foods/beverages high in sugars and carbs and follow Heart Healthy or DASH diet. Increase physical activity to at least 30 minutes cardio exercise daily.   3. Hyperlipidemia, unspecified hyperlipidemia type Continue Atorvastatin as prescribed for heart protectant.  - atorvastatin (LIPITOR) 10 MG tablet; Take 1 tablet (10 mg total) by mouth daily.  Dispense: 30 tablet; Refill: 3  4. Essential hypertension Blood pressure is stable at 140/66 today. He will continue Lisinopril as prescribed. She will continue to decrease high sodium intake, excessive alcohol intake, increase potassium intake, smoking cessation, and increase physical activity of at least 30 minutes of cardio activity daily. She will continue to follow Heart Healthy or DASH diet.  5. Vitamin D deficiency - Vitamin D, Ergocalciferol, (DRISDOL) 1.25 MG (50000 UT) CAPS capsule; Take 1 capsule (50,000 Units total) by mouth every 7 (seven) days.  Dispense: 5 capsule; Refill: 3  6. Anxiety We will increase Buspar to 10 mg BID today. - busPIRone (BUSPAR) 10 MG tablet; Take 1 tablet (10 mg total) by mouth 3 (three) times daily.  Dispense: 60 tablet; Refill: 3  7. Follow up He will follow up in  3  months.   Meds ordered this encounter  Medications  . insulin lispro (HUMALOG) injection 10 Units  . atorvastatin (LIPITOR) 10 MG tablet    Sig: Take 1 tablet (10 mg total) by mouth daily.    Dispense:  30 tablet    Refill:  3  . glucose blood (ACCU-CHEK AVIVA) test strip    Sig: Use as instructed    Dispense:  100 each    Refill:  12  . Vitamin D, Ergocalciferol, (DRISDOL) 1.25 MG (50000 UT) CAPS capsule    Sig: Take 1 capsule (50,000 Units total) by mouth every 7 (seven) days.    Dispense:  5 capsule    Refill:  3  . busPIRone (BUSPAR) 10 MG tablet    Sig: Take 1 tablet (10 mg total) by mouth 3 (three) times daily.    Dispense:  60 tablet    Refill:  3    Orders Placed This Encounter  Procedures  . Urinalysis  . POCT glycosylated hemoglobin (Hb A1C)  . POCT glucose (manual entry)    Referral Orders  No referral(s) requested today    Kathe Becton,  MSN, FNP-C [facility] [place], [place] [address] [phone]  Problem List Items Addressed This Visit      Cardiovascular and Mediastinum   Essential hypertension   Relevant Medications   atorvastatin (LIPITOR) 10 MG tablet     Endocrine   Type 2 diabetes mellitus with complication, with long-term current use of insulin (HCC) - Primary   Relevant Medications   insulin lispro (HUMALOG) injection 10 Units (Completed)   atorvastatin (LIPITOR) 10 MG tablet   glucose blood (ACCU-CHEK AVIVA) test strip   Other Relevant Orders   POCT glycosylated hemoglobin (Hb A1C) (Completed)   POCT glucose (manual entry) (Completed)   Urinalysis     Other   Anxiety   Relevant Medications   busPIRone (BUSPAR) 10 MG tablet    Other Visit Diagnoses    Hyperglycemia       Hyperlipidemia, unspecified hyperlipidemia type       Relevant Medications   atorvastatin (LIPITOR) 10 MG tablet   Vitamin D deficiency       Relevant Medications   Vitamin D, Ergocalciferol, (DRISDOL)  1.25 MG (50000 UT) CAPS capsule   Follow up          Meds ordered this encounter  Medications  . insulin lispro (HUMALOG) injection 10 Units  . atorvastatin (LIPITOR) 10 MG tablet    Sig: Take 1 tablet (10 mg total) by mouth daily.    Dispense:  30 tablet    Refill:  3  . glucose blood (ACCU-CHEK AVIVA) test strip    Sig: Use as instructed    Dispense:  100 each    Refill:  12  . Vitamin D, Ergocalciferol, (DRISDOL) 1.25 MG (50000 UT) CAPS capsule    Sig: Take 1 capsule (50,000 Units total) by mouth every 7 (seven) days.    Dispense:  5 capsule    Refill:  3  . busPIRone (BUSPAR) 10 MG tablet    Sig: Take 1 tablet (10 mg total) by mouth 3 (three) times daily.    Dispense:  60 tablet    Refill:  3    Follow-up: Return in about 3 months (around 11/15/2018).    Azzie Glatter, FNP

## 2018-08-16 ENCOUNTER — Other Ambulatory Visit: Payer: Self-pay | Admitting: Family Medicine

## 2018-08-16 DIAGNOSIS — N390 Urinary tract infection, site not specified: Secondary | ICD-10-CM

## 2018-08-16 LAB — URINALYSIS
Bilirubin, UA: NEGATIVE
Ketones, UA: NEGATIVE
Nitrite, UA: NEGATIVE
Protein,UA: NEGATIVE
RBC, UA: NEGATIVE
Specific Gravity, UA: 1.014 (ref 1.005–1.030)
Urobilinogen, Ur: 0.2 mg/dL (ref 0.2–1.0)
pH, UA: 5.5 (ref 5.0–7.5)

## 2018-08-16 MED ORDER — SULFAMETHOXAZOLE-TRIMETHOPRIM 800-160 MG PO TABS: 1.0000 | ORAL_TABLET | Freq: Two times a day (BID) | ORAL | 0 refills | Status: DC

## 2018-08-17 MED FILL — SULFAMETHOXAZOLE-TMP DS TAB: 800-160 | 7 days supply | Qty: 14 | Fill #0

## 2018-08-24 MED FILL — LISINOPRIL 40 MG TABLET: 40 | 30 days supply | Qty: 30 | Fill #7

## 2018-09-06 MED FILL — TRUE METRIX TEST STRIP: 30 days supply | Qty: 100 | Fill #2

## 2018-09-20 ENCOUNTER — Other Ambulatory Visit: Payer: Self-pay

## 2018-09-20 ENCOUNTER — Ambulatory Visit (INDEPENDENT_AMBULATORY_CARE_PROVIDER_SITE_OTHER): Payer: Self-pay | Admitting: Family Medicine

## 2018-09-20 ENCOUNTER — Encounter: Payer: Self-pay | Admitting: Family Medicine

## 2018-09-20 VITALS — BP 144/70 | HR 86 | Temp 98.0°F | Ht 62.0 in | Wt 194.0 lb

## 2018-09-20 DIAGNOSIS — I10 Essential (primary) hypertension: Secondary | ICD-10-CM

## 2018-09-20 DIAGNOSIS — Z794 Long term (current) use of insulin: Secondary | ICD-10-CM

## 2018-09-20 DIAGNOSIS — E118 Type 2 diabetes mellitus with unspecified complications: Secondary | ICD-10-CM

## 2018-09-20 DIAGNOSIS — Z09 Encounter for follow-up examination after completed treatment for conditions other than malignant neoplasm: Secondary | ICD-10-CM

## 2018-09-20 DIAGNOSIS — S66912A Strain of unspecified muscle, fascia and tendon at wrist and hand level, left hand, initial encounter: Secondary | ICD-10-CM

## 2018-09-20 DIAGNOSIS — F419 Anxiety disorder, unspecified: Secondary | ICD-10-CM

## 2018-09-20 MED ORDER — IBUPROFEN 800 MG PO TABS: 800.0000 mg | ORAL_TABLET | Freq: Three times a day (TID) | ORAL | 3 refills | Status: DC | PRN

## 2018-09-20 MED FILL — IBUPROFEN 800 MG TABLET: 800 | 10 days supply | Qty: 30 | Fill #0

## 2018-09-20 NOTE — Progress Notes (Signed)
Patient [name]   Sick Visit  Subjective:  Patient ID: Wanda Weaver, female    DOB: 1961-02-12  Age: 58 y.o. MRN: [id number]  CC:  Chief Complaint  Patient presents with  . Wrist Pain    left    HPI Wanda Weaver s a 58 year old female who ipresents for Sick Visit today.   Past Medical History:  Diagnosis Date  . Diabetes mellitus without complication ([place])   . Hyperlipidemia   . Hypertension   . Vitamin D deficiency    Current Status: Since her last office visit, she is doing well with no complaints. She has c/o of left wrist pain and numbness X 3 days. She has been taking Motrin as needed for pain. She denies visual changes, chest pain, cough, shortness of breath, heart palpitations, and falls. She has occasional headaches and dizziness with position changes. Denies severe headaches, confusion, seizures, double vision, and blurred vision, nausea and vomiting. She most recent normal range of preprandial blood glucose levels have been between 174-212. She has seen low range of 117 and high of 212 since his last office visit. She denies fatigue, frequent urination, blurred vision, excessive hunger, excessive thirst, weight gain, weight loss, and poor wound healing. She continues to check her feet regularly. Her anxiety is mild today. She denies suicidal ideations, homicidal ideations, or auditory hallucinations.  She denies fevers, chills, recent infections, weight loss, and night sweats. No reports of GI problems such as diarrhea, and constipation. She has no reports of blood in stools, dysuria and hematuria. She denies pain today.   Past Surgical History:  Procedure Laterality Date  . APPENDECTOMY    . CARPAL TUNNEL RELEASE Bilateral   . CHOLECYSTECTOMY    . WRIST SURGERY Bilateral     Family History  Problem Relation Age of Onset  . Diabetes Mother   . Dementia Mother   . Diabetes Father   . Hypertension Father     Social History   Socioeconomic History  . Marital status: Single    Spouse name: Not on file  . Number of children: Not on file  . Years of education: Not on file  . Highest education level: Not on file  Occupational History  . Not on file  Social Needs  . Financial resource strain: Not on file  . Food insecurity:    Worry: Not on file    Inability: Not on file  . Transportation needs:    Medical: Not on file    Non-medical: Not on file  Tobacco Use  . Smoking status: Never Smoker  . Smokeless tobacco: Never Used  Substance and Sexual Activity  . Alcohol use: No  . Drug use: No  . Sexual activity: Not on file  Lifestyle  . Physical activity:    Days per week: Not on file    Minutes per session: Not on file  . Stress: Not on file  Relationships  . Social connections:    Talks on phone: Not on file    Gets together: Not on file    Attends religious service: Not on file    Active member of club or organization: Not on file    Attends meetings of clubs or organizations: Not on file    Relationship status: Not on file  . Intimate partner violence:    Fear of current or ex partner: Not on file    Emotionally abused: Not on file  Physically abused: Not on file    Forced sexual activity: Not on file  Other Topics Concern  . Not on file  Social History Narrative  . Not on file    Outpatient Medications Prior to Visit  Medication Sig Dispense Refill  . albuterol (PROVENTIL HFA;VENTOLIN HFA) 108 (90 Base) MCG/ACT inhaler Inhale 2 puffs into the lungs every 6 (six) hours as needed for wheezing or shortness of breath. 1 Inhaler 12  . atorvastatin (LIPITOR) 10 MG tablet Take 1 tablet (10 mg total) by mouth daily. 30 tablet 3  . blood glucose meter kit and supplies KIT Dispense based on patient and insurance preference. 100 each 12  . busPIRone (BUSPAR) 10 MG tablet Take 1 tablet (10 mg total) by mouth 3 (three) times daily. 60 tablet 3  . cetirizine (ZYRTEC) 10 MG tablet Take 1 tablet (10 mg  total) by mouth daily. 30 tablet 2  . diclofenac (VOLTAREN) 75 MG EC tablet Take 1 tablet (75 mg total) by mouth at bedtime. 30 tablet 1  . glipiZIDE (GLUCOTROL) 10 MG tablet Take 1 tablet (10 mg total) by mouth 2 (two) times daily before a meal. 60 tablet 2  . glucose blood (ACCU-CHEK AVIVA) test strip Use as instructed 100 each 12  . insulin glargine (LANTUS) 100 UNIT/ML injection Inject 0.5 mLs (50 Units total) into the skin at bedtime. 10 mL 11  . Insulin Pen Needle (BD ULTRA-FINE PEN NEEDLES) 29G X 12.7MM MISC ICD10 E11.9 for use with insulin administration 200 each 2  . Insulin Syringe-Needle U-100 (BD INSULIN SYRINGE ULTRAFINE) 31G X 5/16" 0.5 ML MISC 0.5 Units by Does not apply route daily. 10 each 11  . Lancets 30G MISC 1 each by Does not apply route as directed. 100 each 12  . lisinopril (PRINIVIL,ZESTRIL) 40 MG tablet Take 1 tablet (40 mg total) by mouth daily. 90 tablet 3  . metFORMIN (GLUCOPHAGE) 500 MG tablet Take 2 tablets (1,000 mg total) by mouth 2 (two) times daily with a meal. 180 tablet 2  . Vitamin D, Ergocalciferol, (DRISDOL) 1.25 MG (50000 UT) CAPS capsule Take 1 capsule (50,000 Units total) by mouth every 7 (seven) days. 5 capsule 3  . sulfamethoxazole-trimethoprim (BACTRIM DS) 800-160 MG tablet Take 1 tablet by mouth 2 (two) times daily. 14 tablet 0   No facility-administered medications prior to visit.     No Known Allergies  ROS Review of Systems  Constitutional: Negative.   HENT: Negative.   Eyes: Negative.   Respiratory: Negative.   Cardiovascular: Negative.   Gastrointestinal: Negative.   Endocrine: Negative.   Genitourinary: Negative.   Musculoskeletal: Positive for arthralgias (generalized).       Wrist pain  Skin: Negative.   Allergic/Immunologic: Negative.   Neurological: Positive for numbness (left wrist).       Left wrist swelling.   Hematological: Negative.   Psychiatric/Behavioral: Negative.       Objective:    Physical Exam   Constitutional: She is oriented to person, place, and time. She appears well-developed and well-nourished.  HENT:  Head: Normocephalic and atraumatic.  Eyes: Conjunctivae are normal.  Neck: Normal range of motion. Neck supple.  Cardiovascular: Normal rate, regular rhythm and normal heart sounds.  Pulmonary/Chest: Effort normal and breath sounds normal.  Abdominal: Soft. Bowel sounds are normal. She exhibits distension (Obese).  Musculoskeletal: Normal range of motion.     Comments: Left wrist pain, swelling.   Neurological: She is alert and oriented to person, place, and time. She  has normal reflexes.  Skin: Skin is warm and dry.  Psychiatric: She has a normal mood and affect. Her behavior is normal. Judgment and thought content normal.  Nursing note and vitals reviewed.   BP (!) 144/70 (BP Location: Right Arm, Patient Position: Sitting, Cuff Size: Small)   Pulse 86   Temp 98 F (36.7 C) (Oral)   Ht '5\' 2"'$  (1.575 m)   Wt 194 lb (88 kg)   SpO2 100%   BMI 35.48 kg/m  Wt Readings from Last 3 Encounters:  09/20/18 194 lb (88 kg)  08/15/18 187 lb (84.8 kg)  06/01/18 184 lb (83.5 kg)     Health Maintenance Due  Topic Date Due  . TETANUS/TDAP  09/07/1980  . MAMMOGRAM  09/08/2011  . COLONOSCOPY  09/08/2011  . OPHTHALMOLOGY EXAM  08/20/2018    There are no preventive care reminders to display for this patient.  Lab Results  Component Value Date   TSH 1.640 06/01/2018   Lab Results  Component Value Date   WBC 7.9 06/01/2018   HGB 12.6 06/01/2018   HCT 37.2 06/01/2018   MCV 82 06/01/2018   PLT 249 06/01/2018   Lab Results  Component Value Date   NA 140 06/01/2018   K 4.2 06/01/2018   CO2 22 06/01/2018   GLUCOSE 248 (H) 06/01/2018   BUN 12 06/01/2018   CREATININE 0.67 06/01/2018   BILITOT 0.4 06/01/2018   ALKPHOS 95 06/01/2018   AST 17 06/01/2018   ALT 21 06/01/2018   PROT 6.4 06/01/2018   ALBUMIN 4.0 06/01/2018   CALCIUM 9.1 06/01/2018   Lab Results   Component Value Date   CHOL 231 (H) 06/01/2018   Lab Results  Component Value Date   HDL 43 06/01/2018   Lab Results  Component Value Date   LDLCALC 156 (H) 06/01/2018   Lab Results  Component Value Date   TRIG 159 (H) 06/01/2018   Lab Results  Component Value Date   CHOLHDL 5.4 (H) 06/01/2018   Lab Results  Component Value Date   HGBA1C 10.2 (A) 08/15/2018      Assessment & Plan:   1. Muscle strain of wrist, left, initial encounter We will initiate Motrin today. Written material on treatment using RICE discussed and printed at discharge.  - ibuprofen (ADVIL) 800 MG tablet; Take 1 tablet (800 mg total) by mouth every 8 (eight) hours as needed.  Dispense: 30 tablet; Refill: 3  2. Type 2 diabetes mellitus with complication, with long-term current use of insulin ([facility]) Hgb A1c continues to be elevated, but stable at 10.2, from 10.0 on 06/01/2018. She will continue to decrease foods/beverages high in sugars and carbs and follow Heart Healthy or DASH diet. Increase physical activity to at least 30 minutes cardio exercise daily.   3. Essential hypertension The current medical regimen is effective; blood pressure is stable at 144/77 today; continue present plan and medications as prescribed. She will continue to decrease high sodium intake, excessive alcohol intake, increase potassium intake, smoking cessation, and increase physical activity of at least 30 minutes of cardio activity daily. She will continue to follow Heart Healthy or DASH diet.  4. Anxiety Mild.  5. Follow up She will follow up in 3 months.   Meds ordered this encounter  Medications  . ibuprofen (ADVIL) 800 MG tablet    Sig: Take 1 tablet (800 mg total) by mouth every 8 (eight) hours as needed.    Dispense:  30 tablet    Refill:  3    No orders of the defined types were placed in this encounter.   Referral Orders  No referral(s) requested today    Kathe Becton,  MSN, FNP-BC Patient [place], [place] [phone]   Problem List Items Addressed This Visit      Cardiovascular and Mediastinum   Essential hypertension     Endocrine   Type 2 diabetes mellitus with complication, with long-term current use of insulin ([place])     Other   Anxiety    Other Visit Diagnoses    Muscle strain of wrist, left, initial encounter    -  Primary   Relevant Medications   ibuprofen (ADVIL) 800 MG tablet   Follow up          Meds ordered this encounter  Medications  . ibuprofen (ADVIL) 800 MG tablet    Sig: Take 1 tablet (800 mg total) by mouth every 8 (eight) hours as needed.    Dispense:  30 tablet    Refill:  3    Follow-up: No follow-ups on file.    Azzie Glatter, FNP

## 2018-09-20 NOTE — Patient Instructions (Signed)
Ibuprofen tablets and capsules What is this medicine? IBUPROFEN (eye BYOO proe fen) is a non-steroidal anti-inflammatory drug (NSAID). It is used for dental pain, fever, headaches or migraines, osteoarthritis, rheumatoid arthritis, or painful monthly periods. It can also relieve minor aches and pains caused by a cold, flu, or sore throat. This medicine may be used for other purposes; ask your health care provider or pharmacist if you have questions. COMMON BRAND NAME(S): Advil, Advil Junior Strength, Advil Migraine, Genpril, Ibren, IBU, Midol, Midol Cramps and Body Aches, Motrin, Motrin IB, Motrin Junior Strength, Motrin Migraine Pain, Samson-8, Toxicology Saliva Collection What should I tell my health care provider before I take this medicine? They need to know if you have any of these conditions: -cigarette smoker -coronary artery bypass graft (CABG) surgery within the past 2 weeks -drink more than 3 alcohol-containing drinks a day -heart disease -high blood pressure -history of stomach bleeding -kidney disease -liver disease -lung or breathing disease, like asthma -an unusual or allergic reaction to ibuprofen, aspirin, other NSAIDs, other medicines, foods, dyes, or preservatives -pregnant or trying to get pregnant -breast-feeding How should I use this medicine? Take this medicine by mouth with a glass of water. Follow the directions on the prescription label. Take this medicine with food if your stomach gets upset. Try to not lie down for at least 10 minutes after you take the medicine. Take your medicine at regular intervals. Do not take your medicine more often than directed. A special MedGuide will be given to you by the pharmacist with each prescription and refill. Be sure to read this information carefully each time. Talk to your pediatrician regarding the use of this medicine in children. Special care may be needed. Overdosage: If you think you have taken too much of this medicine  contact a poison control center or emergency room at once. NOTE: This medicine is only for you. Do not share this medicine with others. What if I miss a dose? If you miss a dose, take it as soon as you can. If it is almost time for your next dose, take only that dose. Do not take double or extra doses. What may interact with this medicine? Do not take this medicine with any of the following medications: -cidofovir -ketorolac -methotrexate -pemetrexed This medicine may also interact with the following medications: -alcohol -aspirin -diuretics -lithium -other drugs for inflammation like prednisone -warfarin This list may not describe all possible interactions. Give your health care provider a list of all the medicines, herbs, non-prescription drugs, or dietary supplements you use. Also tell them if you smoke, drink alcohol, or use illegal drugs. Some items may interact with your medicine. What should I watch for while using this medicine? Tell your doctor or healthcare professional if your symptoms do not start to get better or if they get worse. This medicine does not prevent heart attack or stroke. In fact, this medicine may increase the chance of a heart attack or stroke. The chance may increase with longer use of this medicine and in people who have heart disease. If you take aspirin to prevent heart attack or stroke, talk with your doctor or health care professional. Do not take other medicines that contain aspirin, ibuprofen, or naproxen with this medicine. Side effects such as stomach upset, nausea, or ulcers may be more likely to occur. Many medicines available without a prescription should not be taken with this medicine. This medicine can cause ulcers and bleeding in the stomach and intestines at any time  during treatment. Ulcers and bleeding can happen without warning symptoms and can cause death. To reduce your risk, do not smoke cigarettes or drink alcohol while you are taking this  medicine. You may get drowsy or dizzy. Do not drive, use machinery, or do anything that needs mental alertness until you know how this medicine affects you. Do not stand or sit up quickly, especially if you are an older patient. This reduces the risk of dizzy or fainting spells. This medicine can cause you to bleed more easily. Try to avoid damage to your teeth and gums when you brush or floss your teeth. This medicine may be used to treat migraines. If you take migraine medicines for 10 or more days a month, your migraines may get worse. Keep a diary of headache days and medicine use. Contact your healthcare professional if your migraine attacks occur more frequently. What side effects may I notice from receiving this medicine? Side effects that you should report to your doctor or health care professional as soon as possible: -allergic reactions like skin rash, itching or hives, swelling of the face, lips, or tongue -severe stomach pain -signs and symptoms of bleeding such as bloody or black, tarry stools; red or dark-brown urine; spitting up blood or brown material that looks like coffee grounds; red spots on the skin; unusual bruising or bleeding from the eye, gums, or nose -signs and symptoms of a blood clot such as changes in vision; chest pain; severe, sudden headache; trouble speaking; sudden numbness or weakness of the face, arm, or leg -unexplained weight gain or swelling -unusually weak or tired -yellowing of eyes or skin Side effects that usually do not require medical attention (report to your doctor or health care professional if they continue or are bothersome): -bruising -diarrhea -dizziness, drowsiness -headache -nausea, vomiting This list may not describe all possible side effects. Call your doctor for medical advice about side effects. You may report side effects to FDA at 1-800-FDA-1088. Where should I keep my medicine? Keep out of the reach of children. Store at room  temperature between 15 and 30 degrees C (59 and 86 degrees F). Keep container tightly closed. Throw away any unused medicine after the expiration date. NOTE: This sheet is a summary. It may not cover all possible information. If you have questions about this medicine, talk to your doctor, pharmacist, or health care provider.  2019 Elsevier/Gold Standard (2016-12-02 12:43:57) Muscle Strain A muscle strain is an injury that happens when a muscle is stretched longer than normal. This can happen during a fall, sports, or lifting. This can tear some muscle fibers. Usually, recovery from muscle strain takes 1-2 weeks. Complete healing normally takes 5-6 weeks. This condition is first treated with PRICE therapy. This involves:  Protecting your muscle from being injured again.  Resting your injured muscle.  Icing your injured muscle.  Applying pressure (compression) to your injured muscle. This may be done with a splint or elastic bandage.  Raising (elevating) your injured muscle. Your doctor may also recommend medicine for pain. Follow these instructions at home: If you have a splint:  Wear the splint as told by your doctor. Take it off only as told by your doctor.  Loosen the splint if your fingers or toes tingle, get numb, or turn cold and blue.  Keep the splint clean.  If the splint is not waterproof: ? Do not let it get wet. ? Cover it with a watertight covering when you take a bath or a  shower. Managing pain, stiffness, and swelling   If directed, put ice on your injured area. ? If you have a removable splint, take it off as told by your doctor. ? Put ice in a plastic bag. ? Place a towel between your skin and the bag. ? Leave the ice on for 20 minutes, 2-3 times a day.  Move your fingers or toes often. This helps to avoid stiffness and lessen swelling.  Raise your injured area above the level of your heart while you are sitting or lying down.  Wear an elastic bandage as told  by your doctor. Make sure it is not too tight. General instructions  Take over-the-counter and prescription medicines only as told by your doctor.  Limit your activity. Rest your injured muscle as told by your doctor. Your doctor may say that gentle movements are okay.  If physical therapy was prescribed, do exercises as told by your doctor.  Do not put pressure on any part of the splint until it is fully hardened. This may take many hours.  Do not use any products that contain nicotine or tobacco, such as cigarettes and e-cigarettes. These can delay bone healing. If you need help quitting, ask your doctor.  Warm up before you exercise. This helps to prevent more muscle strains.  Ask your doctor when it is safe to drive if you have a splint.  Keep all follow-up visits as told by your doctor. This is important. Contact a doctor if:  You have more pain or swelling in your injured area. Get help right away if:  You have any of these problems in your injured area: ? You have numbness. ? You have tingling. ? You lose a lot of strength. Summary  A muscle strain is an injury that happens when a muscle is stretched longer than normal.  This condition is first treated with PRICE therapy. This includes protecting, resting, icing, adding pressure, and raising your injury.  Limit your activity. Rest your injured muscle as told by your doctor. Your doctor may say that gentle movements are okay.  Warm up before you exercise. This helps to prevent more muscle strains. This information is not intended to replace advice given to you by your health care provider. Make sure you discuss any questions you have with your health care provider. Document Released: 01/07/2008 Document Revised: 05/06/2016 Document Reviewed: 05/06/2016 Elsevier Interactive Patient Education  2019 [employer] Wrist Splint, Adult A wrist splint holds your wrist in a position so it does not move. A splint supports your  wrist like a cast, but it is not stiff like a cast (is flexible). You can take it off or make it looser. You may need a wrist splint if you hurt your wrist or have swelling in your wrist. A splint can:  Support your wrist.  Protect your wrist when it is hurt (injured).  Help you not hurt your wrist again.  Make your wrist stay still and not move.  Lessen pain.  Help your wrist heal. It is important to wear your splint as told by your doctor. This helps you make sure that your wrist heals the right way. What are the risks? If you wear your splint too tight or you have a lot of swelling, blood may not be able to go to your wrist or hand. If this happens, you can get a condition called compartment syndrome. It can be dangerous and cause damage that lasts. Symptoms are:  Pain in your wrist  that gets worse.  Tingling.  Having no feeling in your wrist or hand. This is called numbness.  Changes in skin color. The skin may look very light (pale) or kind of blue.  Cold fingers. Other risks of wearing a splint may be:  A stiff wrist.  A weak wrist.  Skin irritation that can cause: ? Itching. ? Rash. ? Sores. ? Infection. How to use your wrist splint Your wrist splint should be tight enough to support your wrist. It should not block blood from going to your hand or wrist. Your doctor will tell you how to wear your wrist splint and how long to wear it. Splint wear  Wear the splint as told by your doctor. Only take it off as told by your doctor.  Loosen the splint if your fingers tingle, get numb, or turn cold and blue.  Keep the splint clean.  If the splint is not waterproof: ? Do not let it get wet. ? Cover it with a watertight covering when you take a bath or a shower  Do not stick anything inside the splint to scratch your skin. Doing that increases your risk of infection.  Check the skin under the splint every time you take it off. Check for any redness or blisters. Tell  your doctor about any skin problems. Managing pain, stiffness, and swelling   If directed, put ice on the injured area. ? If you a have a splint that can be taken off, take it off as told by your doctor. ? Put ice in a plastic bag. ? Place a towel between your skin and the bag. ? Leave the ice on for 20 minutes, 2-3 times a day.  Move your fingers often to avoid stiffness and to lessen swelling.  Raise (elevate) the injured area above the level of your heart while you are sitting or lying down. Activity  Return to your normal activities as told by your doctor. Ask your doctor what activities are safe for you.  Do exercises as told by your doctor.  Ask your doctor when it is safe to drive with a splint on your wrist. General instructions  Do not use the injured limb to support (bear) your body weight until your doctor says that you can.  Do not put pressure on any part of the splint until it is fully hardened. This may take many hours.  Do not use any products that have nicotine or tobacco in them, such as cigarettes and e-cigarettes. If you need help quitting, ask your doctor.  Take over-the-counter and prescription medicines only as told by your doctor.  Keep all follow-up visits as told by your doctor. This is important. Get help if:  You have wrist pain or swelling that does not go away.  The skin around or under your splint gets red, itchy, or moist.  You have chills or fever.  Your splint feels too tight or too loose.  Your splint breaks. Get help right away if:  You have pain that gets worse.  You have tingling and numbness.  You have changes in skin color, including paleness or a bluish color.  Your fingers are cold. Summary  A wrist splint is a flexible device that supports your wrist and keeps your wrist from moving.  It is important to wear your splint as told by your doctor. This helps to make sure that your wrist heals correctly.  Icing, moving your  fingers, and raising your wrist above the level  of your heart will help you manage pain, stiffness, and swelling.  Your wrist splint should be tight enough to support your wrist. It should not block your blood supply.  Get help right away if your fingers tingle, get numb, or turn cold and blue. Loosen the splint right away. This information is not intended to replace advice given to you by your health care provider. Make sure you discuss any questions you have with your health care provider. Document Released: 09/16/2007 Document Revised: 06/17/2016 Document Reviewed: 06/17/2016 Elsevier Interactive Patient Education  2019 [employer].

## 2018-09-22 DIAGNOSIS — S66912A Strain of unspecified muscle, fascia and tendon at wrist and hand level, left hand, initial encounter: Secondary | ICD-10-CM | POA: Insufficient documentation

## 2018-09-22 MED FILL — metFORMIN HCL 500 MG TABS: 500 | 15 days supply | Qty: 60 | Fill #4

## 2018-09-22 MED FILL — LISINOPRIL 40 MG TABLET: 40 | 30 days supply | Qty: 30 | Fill #8

## 2018-10-21 MED FILL — LISINOPRIL 40 MG TABLET: 40 | 30 days supply | Qty: 30 | Fill #9

## 2018-10-25 ENCOUNTER — Telehealth: Payer: Self-pay | Admitting: Family Medicine

## 2018-10-25 DIAGNOSIS — E118 Type 2 diabetes mellitus with unspecified complications: Secondary | ICD-10-CM

## 2018-10-25 DIAGNOSIS — Z794 Long term (current) use of insulin: Secondary | ICD-10-CM

## 2018-10-25 MED ORDER — METFORMIN HCL 500 MG PO TABS: 1000.0000 mg | ORAL_TABLET | Freq: Two times a day (BID) | ORAL | 2 refills | Status: DC

## 2018-10-25 MED FILL — metFORMIN HCL 500 MG TABS: 500 | 30 days supply | Qty: 120 | Fill #0

## 2018-10-25 NOTE — Telephone Encounter (Signed)
Patient notified that medication was sent to pharmacy.

## 2018-11-15 ENCOUNTER — Ambulatory Visit: Payer: Self-pay | Admitting: Family Medicine

## 2018-11-17 MED FILL — LANTUS 100 UNITS/ML VIAL: 100 | 20 days supply | Qty: 10 | Fill #4

## 2018-11-22 ENCOUNTER — Other Ambulatory Visit: Payer: Self-pay

## 2018-11-22 ENCOUNTER — Telehealth: Payer: Self-pay

## 2018-11-22 ENCOUNTER — Other Ambulatory Visit: Payer: Self-pay | Admitting: Family Medicine

## 2018-11-22 DIAGNOSIS — I10 Essential (primary) hypertension: Secondary | ICD-10-CM

## 2018-11-22 MED FILL — LISINOPRIL 40 MG TABLET: 40 | 30 days supply | Qty: 30 | Fill #0

## 2018-11-22 NOTE — Telephone Encounter (Signed)
Left a detail vm  

## 2018-11-22 NOTE — Telephone Encounter (Signed)
Medication sent to pharmacy  

## 2018-12-16 ENCOUNTER — Ambulatory Visit: Payer: Self-pay | Admitting: Family Medicine

## 2018-12-20 ENCOUNTER — Telehealth: Payer: Self-pay

## 2018-12-20 ENCOUNTER — Other Ambulatory Visit: Payer: Self-pay | Admitting: Family Medicine

## 2018-12-20 DIAGNOSIS — E118 Type 2 diabetes mellitus with unspecified complications: Secondary | ICD-10-CM

## 2018-12-20 DIAGNOSIS — Z794 Long term (current) use of insulin: Secondary | ICD-10-CM

## 2018-12-20 MED FILL — LISINOPRIL 40 MG TABLET: 40 | 30 days supply | Qty: 30 | Fill #1

## 2018-12-20 MED FILL — LANTUS 100 UNITS/ML VIAL: 100 | 20 days supply | Qty: 10 | Fill #0

## 2018-12-20 NOTE — Telephone Encounter (Signed)
Called and left and detail voicemail letting patient know that her refill was already sent into the pharmacy earlier today.

## 2018-12-27 MED FILL — metFORMIN HCL 500 MG TABS: 500 | 30 days supply | Qty: 120 | Fill #1

## 2019-01-19 ENCOUNTER — Telehealth: Payer: Self-pay | Admitting: Family Medicine

## 2019-01-20 MED FILL — LISINOPRIL 40 MG TABLET: 40 | 30 days supply | Qty: 30 | Fill #2

## 2019-01-20 NOTE — Telephone Encounter (Signed)
error 

## 2019-02-02 MED FILL — !LANTUS 100 UNITS/ML VIAL: 100 | 20 days supply | Qty: 10 | Fill #1

## 2019-02-13 ENCOUNTER — Other Ambulatory Visit: Payer: Self-pay | Admitting: Family Medicine

## 2019-02-13 DIAGNOSIS — E118 Type 2 diabetes mellitus with unspecified complications: Secondary | ICD-10-CM

## 2019-02-13 DIAGNOSIS — Z794 Long term (current) use of insulin: Secondary | ICD-10-CM

## 2019-02-13 MED ORDER — TOUJEO MAX SOLOSTAR 300 UNIT/ML ~~LOC~~ SOPN: 50.0000 [IU] | PEN_INJECTOR | Freq: Every day | SUBCUTANEOUS | 3 refills | Status: DC

## 2019-02-14 ENCOUNTER — Other Ambulatory Visit: Payer: Self-pay | Admitting: Family Medicine

## 2019-02-14 DIAGNOSIS — Z794 Long term (current) use of insulin: Secondary | ICD-10-CM

## 2019-02-14 DIAGNOSIS — E118 Type 2 diabetes mellitus with unspecified complications: Secondary | ICD-10-CM

## 2019-02-14 MED ORDER — BASAGLAR KWIKPEN 100 UNIT/ML ~~LOC~~ SOPN: 50.0000 [IU] | PEN_INJECTOR | Freq: Every day | SUBCUTANEOUS | 6 refills | Status: DC

## 2019-02-20 ENCOUNTER — Ambulatory Visit (INDEPENDENT_AMBULATORY_CARE_PROVIDER_SITE_OTHER): Payer: Self-pay | Admitting: Family Medicine

## 2019-02-20 ENCOUNTER — Other Ambulatory Visit: Payer: Self-pay

## 2019-02-20 ENCOUNTER — Telehealth: Payer: Self-pay

## 2019-02-20 ENCOUNTER — Encounter: Payer: Self-pay | Admitting: Family Medicine

## 2019-02-20 VITALS — BP 146/65 | HR 87 | Temp 98.3°F | Ht 62.0 in | Wt 188.6 lb

## 2019-02-20 DIAGNOSIS — R8290 Unspecified abnormal findings in urine: Secondary | ICD-10-CM

## 2019-02-20 DIAGNOSIS — R634 Abnormal weight loss: Secondary | ICD-10-CM

## 2019-02-20 DIAGNOSIS — R739 Hyperglycemia, unspecified: Secondary | ICD-10-CM

## 2019-02-20 DIAGNOSIS — N390 Urinary tract infection, site not specified: Secondary | ICD-10-CM

## 2019-02-20 DIAGNOSIS — F419 Anxiety disorder, unspecified: Secondary | ICD-10-CM

## 2019-02-20 DIAGNOSIS — I10 Essential (primary) hypertension: Secondary | ICD-10-CM

## 2019-02-20 DIAGNOSIS — E118 Type 2 diabetes mellitus with unspecified complications: Secondary | ICD-10-CM

## 2019-02-20 DIAGNOSIS — R7309 Other abnormal glucose: Secondary | ICD-10-CM

## 2019-02-20 DIAGNOSIS — Z09 Encounter for follow-up examination after completed treatment for conditions other than malignant neoplasm: Secondary | ICD-10-CM

## 2019-02-20 DIAGNOSIS — Z794 Long term (current) use of insulin: Secondary | ICD-10-CM

## 2019-02-20 LAB — POCT GLYCOSYLATED HEMOGLOBIN (HGB A1C): Hemoglobin A1C: 9.3 % — AB (ref 4.0–5.6)

## 2019-02-20 LAB — POCT URINALYSIS DIPSTICK
Bilirubin, UA: NEGATIVE
Glucose, UA: NEGATIVE
Ketones, UA: NEGATIVE
Nitrite, UA: NEGATIVE
Protein, UA: NEGATIVE
Spec Grav, UA: 1.02 (ref 1.010–1.025)
Urobilinogen, UA: 0.2 E.U./dL
pH, UA: 5.5 (ref 5.0–8.0)

## 2019-02-20 LAB — GLUCOSE, POCT (MANUAL RESULT ENTRY): POC Glucose: 192 mg/dl — AB (ref 70–99)

## 2019-02-20 MED ORDER — SULFAMETHOXAZOLE-TRIMETHOPRIM 800-160 MG PO TABS: 1.0000 | ORAL_TABLET | Freq: Two times a day (BID) | ORAL | 0 refills | Status: DC

## 2019-02-20 MED FILL — metFORMIN HCL 500 MG TABS: 500 | 30 days supply | Qty: 120 | Fill #2

## 2019-02-20 MED FILL — LISINOPRIL 40 MG TABLET: 40 | 30 days supply | Qty: 30 | Fill #3

## 2019-02-20 NOTE — Progress Notes (Signed)
Patient [facility]   Established Patient Office Visit  Subjective:  Patient ID: Wanda Weaver, female    DOB: [birth year]  Age: 58 y.o. MRN: [id number]  CC:  Chief Complaint  Patient presents with   Follow-up    med follow    HPI Wanda Weaver is a 58 year old female who presents for Follow Up today.   Past Medical History:  Diagnosis Date   Diabetes mellitus without complication ([place])    Hyperlipidemia    Hypertension    Vitamin D deficiency    Current Status: Since her last office visit, she is doing well with no complaints. Her most recent normal range of preprandial blood glucose levels have been between 120-150. She has seen low range of 114 and high of 200 since his last office visit. She denies fatigue, frequent urination, blurred vision, excessive hunger, excessive thirst, weight gain, weight loss, and poor wound healing. She continues to check her feet regularly. She has had weight loss since her last office visit. She denies visual changes, chest pain, cough, shortness of breath, heart palpitations, and falls. She has occasional headaches and dizziness with position changes. Denies severe headaches, confusion, seizures, double vision, and blurred vision, nausea and vomiting. She denies fevers, chills, recent infections, weight loss, and night sweats.  No reports of GI problems such as diarrhea, and constipation. She has no reports of blood in stools, dysuria and hematuria. Her anxiety is stable today. She denies suicidal ideations, homicidal ideations, or auditory hallucinations. She denies pain today.   Past Surgical History:  Procedure Laterality Date   APPENDECTOMY     CARPAL TUNNEL RELEASE Bilateral    CHOLECYSTECTOMY     WRIST SURGERY Bilateral     Family History  Problem Relation Age of Onset   Diabetes Mother    Dementia Mother    Diabetes Father    Hypertension Father     Social History   Socioeconomic  History   Marital status: Single    Spouse name: Not on file   Number of children: Not on file   Years of education: Not on file   Highest education level: Not on file  Occupational History   Not on file  Social Needs   Financial resource strain: Not on file   Food insecurity    Worry: Not on file    Inability: Not on file   Transportation needs    Medical: Not on file    Non-medical: Not on file  Tobacco Use   Smoking status: Never Smoker   Smokeless tobacco: Never Used  Substance and Sexual Activity   Alcohol use: No   Drug use: No   Sexual activity: Yes    Partners: Male  Lifestyle   Physical activity    Days per week: Not on file    Minutes per session: Not on file   Stress: Not on file  Relationships   Social connections    Talks on phone: Not on file    Gets together: Not on file    Attends religious service: Not on file    Active member of club or organization: Not on file    Attends meetings of clubs or organizations: Not on file    Relationship status: Not on file   Intimate partner violence    Fear of current or ex partner: Not on file    Emotionally abused: Not on file    Physically abused: Not on  file    Forced sexual activity: Not on file  Other Topics Concern   Not on file  Social History Narrative   Not on file    Outpatient Medications Prior to Visit  Medication Sig Dispense Refill   albuterol (PROVENTIL HFA;VENTOLIN HFA) 108 (90 Base) MCG/ACT inhaler Inhale 2 puffs into the lungs every 6 (six) hours as needed for wheezing or shortness of breath. 1 Inhaler 12   atorvastatin (LIPITOR) 10 MG tablet Take 1 tablet (10 mg total) by mouth daily. 30 tablet 3   blood glucose meter kit and supplies KIT Dispense based on patient and insurance preference. 100 each 12   busPIRone (BUSPAR) 10 MG tablet Take 1 tablet (10 mg total) by mouth 3 (three) times daily. 60 tablet 3   cetirizine (ZYRTEC) 10 MG tablet Take 1 tablet (10 mg total)  by mouth daily. 30 tablet 2   diclofenac (VOLTAREN) 75 MG EC tablet Take 1 tablet (75 mg total) by mouth at bedtime. 30 tablet 1   glipiZIDE (GLUCOTROL) 10 MG tablet Take 1 tablet (10 mg total) by mouth 2 (two) times daily before a meal. 60 tablet 2   glucose blood (ACCU-CHEK AVIVA) test strip Use as instructed 100 each 12   ibuprofen (ADVIL) 800 MG tablet Take 1 tablet (800 mg total) by mouth every 8 (eight) hours as needed. 30 tablet 3   Insulin Glargine (BASAGLAR KWIKPEN) 100 UNIT/ML SOPN Inject 0.5 mLs (50 Units total) into the skin daily. 50 mL 6   Insulin Pen Needle (BD ULTRA-FINE PEN NEEDLES) 29G X 12.7MM MISC ICD10 E11.9 for use with insulin administration 200 each 2   Insulin Syringe-Needle U-100 (BD INSULIN SYRINGE ULTRAFINE) 31G X 5/16" 0.5 ML MISC 0.5 Units by Does not apply route daily. 10 each 11   Lancets 30G MISC 1 each by Does not apply route as directed. 100 each 12   lisinopril (ZESTRIL) 40 MG tablet TAKE 1 TABLET BY MOUTH DAILY 90 tablet 3   metFORMIN (GLUCOPHAGE) 500 MG tablet Take 2 tablets (1,000 mg total) by mouth 2 (two) times daily with a meal. 180 tablet 2   Vitamin D, Ergocalciferol, (DRISDOL) 1.25 MG (50000 UT) CAPS capsule Take 1 capsule (50,000 Units total) by mouth every 7 (seven) days. 5 capsule 3   No facility-administered medications prior to visit.     No Known Allergies  ROS Review of Systems  Constitutional: Negative.   HENT: Negative.   Eyes: Negative.   Respiratory: Negative.   Cardiovascular: Negative.   Gastrointestinal: Positive for abdominal distention.  Endocrine: Negative.   Genitourinary: Negative.   Musculoskeletal: Positive for arthralgias (generalized).  Skin: Negative.   Neurological: Positive for dizziness (occasional ) and headaches (occasional ).  Hematological: Negative.   Psychiatric/Behavioral: Negative.    Objective:    Physical Exam  Constitutional: She is oriented to person, place, and time. She appears  well-developed and well-nourished.  HENT:  Head: Normocephalic and atraumatic.  Eyes: Conjunctivae are normal.  Neck: Normal range of motion. Neck supple.  Cardiovascular: Normal rate, regular rhythm, normal heart sounds and intact distal pulses.  Pulmonary/Chest: Effort normal and breath sounds normal.  Abdominal: Soft. Bowel sounds are normal. She exhibits distension (obese).  Musculoskeletal: Normal range of motion.  Neurological: She is alert and oriented to person, place, and time. She has normal reflexes.  Skin: Skin is warm and dry.  Psychiatric: She has a normal mood and affect. Her behavior is normal. Judgment and thought content normal.  Vitals reviewed.   BP (!) 146/65    Pulse 87    Temp 98.3 F (36.8 C) (Oral)    Ht '5\' 2"'$  (1.575 m)    Wt 188 lb 9.6 oz (85.5 kg)    SpO2 98%    BMI 34.50 kg/m  Wt Readings from Last 3 Encounters:  02/20/19 188 lb 9.6 oz (85.5 kg)  09/20/18 194 lb (88 kg)  08/15/18 187 lb (84.8 kg)     Health Maintenance Due  Topic Date Due   TETANUS/TDAP  09/07/1980   MAMMOGRAM  09/08/2011   COLONOSCOPY  09/08/2011   OPHTHALMOLOGY EXAM  08/20/2018   INFLUENZA VACCINE  11/12/2018   HEMOGLOBIN A1C  02/15/2019    There are no preventive care reminders to display for this patient.  Lab Results  Component Value Date   TSH 1.640 06/01/2018   Lab Results  Component Value Date   WBC 7.9 06/01/2018   HGB 12.6 06/01/2018   HCT 37.2 06/01/2018   MCV 82 06/01/2018   PLT 249 06/01/2018   Lab Results  Component Value Date   NA 140 06/01/2018   K 4.2 06/01/2018   CO2 22 06/01/2018   GLUCOSE 248 (H) 06/01/2018   BUN 12 06/01/2018   CREATININE 0.67 06/01/2018   BILITOT 0.4 06/01/2018   ALKPHOS 95 06/01/2018   AST 17 06/01/2018   ALT 21 06/01/2018   PROT 6.4 06/01/2018   ALBUMIN 4.0 06/01/2018   CALCIUM 9.1 06/01/2018   Lab Results  Component Value Date   CHOL 231 (H) 06/01/2018   Lab Results  Component Value Date   HDL 43  06/01/2018   Lab Results  Component Value Date   LDLCALC 156 (H) 06/01/2018   Lab Results  Component Value Date   TRIG 159 (H) 06/01/2018   Lab Results  Component Value Date   CHOLHDL 5.4 (H) 06/01/2018   Lab Results  Component Value Date   HGBA1C 9.3 (A) 02/20/2019   Assessment & Plan:   1. Essential hypertension The current medical regimen is effective; blood pressure is stable at 146/65 today; continue present plan and medications as prescribed. She will continue to take medications as prescribed, to decrease high sodium intake, excessive alcohol intake, increase potassium intake, smoking cessation, and increase physical activity of at least 30 minutes of cardio activity daily. She will continue to follow Heart Healthy or DASH diet.  - Urinalysis Dipstick  2. Type 2 diabetes mellitus with complication, with long-term current use of insulin ([place]) She will continue medication as prescribed, to decrease foods/beverages high in sugars and carbs and follow Heart Healthy or DASH diet. Increase physical activity to at least 30 minutes cardio exercise daily.   3. Hyperglycemia Improved. Blood glucose level at 192 today, from 278 08/15/2018.  - Urinalysis Dipstick - POCT HgB A1C - Glucose (CBG)  4. Anxiety Stable.   5. Hemoglobin A1C greater than 9%, indicating poor diabetic control Hgb A1c is decreased at 9.3 today. From 10.2 on 08/15/2018. She will continue medication as prescribed, to decrease foods/beverages high in sugars and carbs and follow Heart Healthy or DASH diet. Increase physical activity to at least 30 minutes cardio exercise daily.   6. Weight loss She has a 6 lb weight loss in 6 months. She will continue to take medications as prescribed, to decrease high sodium intake, excessive alcohol intake, increase potassium intake, smoking cessation, and increase physical activity of at least 30 minutes of cardio activity daily. She will continue to  follow Heart Healthy or DASH  diet.  7. Abnormal urinalysis  8. UTI We will initiate Bactrim today.   9. Follow up She will follow up in 6 months.   Meds ordered this encounter  Medications   sulfamethoxazole-trimethoprim (BACTRIM DS) 800-160 MG tablet    Sig: Take 1 tablet by mouth 2 (two) times daily.    Dispense:  14 tablet    Refill:  0    Orders Placed This Encounter  Procedures   Urine Culture   Urinalysis Dipstick   POCT HgB A1C   Glucose (CBG)    Referral Orders  No referral(s) requested today    Kathe Becton,  MSN, FNP-BC [facility] [address] [place], [place] [address] [phone] fax   Problem List Items Addressed This Visit      Cardiovascular and Mediastinum   Essential hypertension - Primary   Relevant Orders   Urinalysis Dipstick (Completed)     Endocrine   Type 2 diabetes mellitus with complication, with long-term current use of insulin ([facility])     Other   Anxiety   Hyperglycemia   Relevant Orders   Urinalysis Dipstick (Completed)   POCT HgB A1C (Completed)   Glucose (CBG) (Completed)    Other Visit Diagnoses    Hemoglobin A1C greater than 9%, indicating poor diabetic control       Weight loss       Abnormal urinalysis       Relevant Orders   Urine Culture   Urinary tract infection without hematuria, site unspecified       Relevant Medications   sulfamethoxazole-trimethoprim (BACTRIM DS) 800-160 MG tablet   Follow up          Meds ordered this encounter  Medications   sulfamethoxazole-trimethoprim (BACTRIM DS) 800-160 MG tablet    Sig: Take 1 tablet by mouth 2 (two) times daily.    Dispense:  14 tablet    Refill:  0    Follow-up: Return in about 3 months (around 05/23/2019).    Azzie Glatter, FNP

## 2019-02-20 NOTE — Telephone Encounter (Signed)
Called, no answer. Left a message to call back. Thanks!  

## 2019-02-20 NOTE — Telephone Encounter (Signed)
-----   Message from Azzie Glatter, [place] sent at 02/20/2019  4:06 PM EST ----- Regarding: "UTI" Urinalysis revealed Bacterial. New Rx for Bactrim sent to pharmacy today. Please inform patient. Thank you.

## 2019-02-21 DIAGNOSIS — R634 Abnormal weight loss: Secondary | ICD-10-CM | POA: Insufficient documentation

## 2019-02-21 DIAGNOSIS — R7309 Other abnormal glucose: Secondary | ICD-10-CM | POA: Insufficient documentation

## 2019-02-21 NOTE — Telephone Encounter (Signed)
Called and spoke with patient, advised that there was bacteria in urine which may signify a urinary tract infection and to start bactrim as directed until complete. Patient verbalized understanding. Thanks!

## 2019-02-22 LAB — URINE CULTURE

## 2019-03-21 MED FILL — !LANTUS 100 UNITS/ML VIAL: 100 | 20 days supply | Qty: 10 | Fill #2

## 2019-03-21 MED FILL — LISINOPRIL 40 MG TABLET: 40 | 30 days supply | Qty: 30 | Fill #4

## 2019-04-20 MED FILL — metFORMIN HCL 500 MG TABS: 500 | 30 days supply | Qty: 120 | Fill #3

## 2019-04-20 MED FILL — LISINOPRIL 40 MG TABLET: 40 | 30 days supply | Qty: 30 | Fill #5

## 2019-05-05 MED FILL — !LANTUS 100 UNITS/ML VIAL: 100 | 20 days supply | Qty: 10 | Fill #3

## 2019-05-11 ENCOUNTER — Telehealth: Payer: Self-pay | Admitting: Family Medicine

## 2019-05-11 MED FILL — busPIRone HCL 10 MG TABS: 10 | 20 days supply | Qty: 60 | Fill #1

## 2019-05-11 NOTE — Telephone Encounter (Signed)
  Patient sister expired two days ago.   Dee advised patient to call the pharmacy to have anxiety medication filled. Per patient she does have refills and will try the prescription. If it does not help she will call back.  Please advise.   Thank you,  Jenee Spaugh

## 2019-05-12 ENCOUNTER — Other Ambulatory Visit: Payer: Self-pay

## 2019-05-12 MED ORDER — TRUE METRIX BLOOD GLUCOSE TEST VI STRP: ORAL_STRIP | 12 refills | Status: DC

## 2019-05-12 MED FILL — TRUE METRIX GLUCOSE TEST ST: 30 days supply | Qty: 100 | Fill #0

## 2019-05-19 MED FILL — LISINOPRIL 40 MG TABLET: 40 | 30 days supply | Qty: 30 | Fill #6

## 2019-05-23 ENCOUNTER — Ambulatory Visit: Payer: Self-pay | Admitting: Family Medicine

## 2019-06-19 MED FILL — LISINOPRIL 40 MG TABLET: 40 | 30 days supply | Qty: 30 | Fill #7

## 2019-06-19 MED FILL — !LANTUS 100 UNITS/ML VIAL: 100 | 20 days supply | Qty: 10 | Fill #4

## 2019-06-19 MED FILL — metFORMIN HCL 500 MG TABS: 500 | 15 days supply | Qty: 60 | Fill #4

## 2019-06-30 ENCOUNTER — Encounter: Payer: Self-pay | Admitting: Family Medicine

## 2019-06-30 ENCOUNTER — Ambulatory Visit (INDEPENDENT_AMBULATORY_CARE_PROVIDER_SITE_OTHER): Payer: Self-pay | Admitting: Family Medicine

## 2019-06-30 ENCOUNTER — Other Ambulatory Visit: Payer: Self-pay

## 2019-06-30 VITALS — BP 148/65 | HR 94 | Temp 98.5°F | Wt 192.2 lb

## 2019-06-30 DIAGNOSIS — Z09 Encounter for follow-up examination after completed treatment for conditions other than malignant neoplasm: Secondary | ICD-10-CM

## 2019-06-30 DIAGNOSIS — F4321 Adjustment disorder with depressed mood: Secondary | ICD-10-CM

## 2019-06-30 DIAGNOSIS — Z794 Long term (current) use of insulin: Secondary | ICD-10-CM

## 2019-06-30 DIAGNOSIS — R739 Hyperglycemia, unspecified: Secondary | ICD-10-CM

## 2019-06-30 DIAGNOSIS — I10 Essential (primary) hypertension: Secondary | ICD-10-CM

## 2019-06-30 DIAGNOSIS — E118 Type 2 diabetes mellitus with unspecified complications: Secondary | ICD-10-CM

## 2019-06-30 DIAGNOSIS — R8290 Unspecified abnormal findings in urine: Secondary | ICD-10-CM

## 2019-06-30 DIAGNOSIS — G4700 Insomnia, unspecified: Secondary | ICD-10-CM

## 2019-06-30 DIAGNOSIS — F419 Anxiety disorder, unspecified: Secondary | ICD-10-CM

## 2019-06-30 DIAGNOSIS — R7309 Other abnormal glucose: Secondary | ICD-10-CM

## 2019-06-30 LAB — POCT URINALYSIS DIPSTICK
Bilirubin, UA: NEGATIVE
Glucose, UA: POSITIVE — AB
Ketones, UA: NEGATIVE
Nitrite, UA: NEGATIVE
Protein, UA: NEGATIVE
Spec Grav, UA: 1.01 (ref 1.010–1.025)
Urobilinogen, UA: 0.2 E.U./dL
pH, UA: 5.5 (ref 5.0–8.0)

## 2019-06-30 LAB — POCT GLYCOSYLATED HEMOGLOBIN (HGB A1C): Hemoglobin A1C: 10.2 % — AB (ref 4.0–5.6)

## 2019-06-30 LAB — GLUCOSE, POCT (MANUAL RESULT ENTRY): POC Glucose: 226 mg/dl — AB (ref 70–99)

## 2019-06-30 MED ORDER — ALPRAZOLAM 0.5 MG PO TABS: 0.5000 mg | ORAL_TABLET | Freq: Every evening | ORAL | 0 refills | Status: DC | PRN

## 2019-06-30 NOTE — Progress Notes (Signed)
Patient [facility]    Established Patient Office Visit  Subjective:  Patient ID: Wanda Weaver, female    DOB: 28-Oct-1960  Age: 59 y.o. MRN: [id number]  CC:  Chief Complaint  Patient presents with  . Follow-up    HTN   HPI Wanda Weaver is a 59 year old female who presents for Follow Up today.   Past Medical History:  Diagnosis Date  . Diabetes mellitus without complication ([place])   . Hyperlipidemia   . Hypertension   . Vitamin D deficiency     Current Status: Since her last office visit, she is doing well with no complaints.  She denies visual changes, chest pain, cough, shortness of breath, heart palpitations, and falls. She has occasional headaches and dizziness with position changes. Denies severe headaches, confusion, seizures, double vision, and blurred vision, nausea and vomiting. She denies fatigue, frequent urination, blurred vision, excessive hunger, excessive thirst, weight gain, weight loss, and poor wound healing. She continues to check her feet regularly. She denies fevers, chills, recent infections, weight loss, and night sweats. No reports of GI problems such as diarrhea, and constipation. She has no reports of blood in stools, dysuria and hematuria. No depression or anxiety reported today. She denies suicidal ideations, homicidal ideations, or auditory hallucinations. She denies pain today.   Past Surgical History:  Procedure Laterality Date  . APPENDECTOMY    . CARPAL TUNNEL RELEASE Bilateral   . CHOLECYSTECTOMY    . WRIST SURGERY Bilateral     Family History  Problem Relation Age of Onset  . Diabetes Mother   . Dementia Mother   . Diabetes Father   . Hypertension Father     Social History   Socioeconomic History  . Marital status: Single    Spouse name: Not on file  . Number of children: Not on file  . Years of education: Not on file  . Highest education level: Not on file  Occupational History  . Not on file   Tobacco Use  . Smoking status: Never Smoker  . Smokeless tobacco: Never Used  Substance and Sexual Activity  . Alcohol use: No  . Drug use: No  . Sexual activity: Yes    Partners: Male  Other Topics Concern  . Not on file  Social History Narrative  . Not on file   Social Determinants of Health   Financial Resource Strain:   . Difficulty of Paying Living Expenses:   Food Insecurity:   . Worried About Charity fundraiser in the Last Year:   . Arboriculturist in the Last Year:   Transportation Needs:   . Film/video editor (Medical):   Marland Kitchen Lack of Transportation (Non-Medical):   Physical Activity:   . Days of Exercise per Week:   . Minutes of Exercise per Session:   Stress:   . Feeling of Stress :   Social Connections:   . Frequency of Communication with Friends and Family:   . Frequency of Social Gatherings with Friends and Family:   . Attends Religious Services:   . Active Member of Clubs or Organizations:   . Attends Archivist Meetings:   Marland Kitchen Marital Status:   Intimate Partner Violence:   . Fear of Current or Ex-Partner:   . Emotionally Abused:   Marland Kitchen Physically Abused:   . Sexually Abused:     Outpatient Medications Prior to Visit  Medication Sig Dispense Refill  . albuterol (PROVENTIL HFA;VENTOLIN  HFA) 108 (90 Base) MCG/ACT inhaler Inhale 2 puffs into the lungs every 6 (six) hours as needed for wheezing or shortness of breath. 1 Inhaler 12  . atorvastatin (LIPITOR) 10 MG tablet Take 1 tablet (10 mg total) by mouth daily. 30 tablet 3  . blood glucose meter kit and supplies KIT Dispense based on patient and insurance preference. 100 each 12  . busPIRone (BUSPAR) 10 MG tablet Take 1 tablet (10 mg total) by mouth 3 (three) times daily. 60 tablet 3  . cetirizine (ZYRTEC) 10 MG tablet Take 1 tablet (10 mg total) by mouth daily. 30 tablet 2  . diclofenac (VOLTAREN) 75 MG EC tablet Take 1 tablet (75 mg total) by mouth at bedtime. 30 tablet 1  . glipiZIDE  (GLUCOTROL) 10 MG tablet Take 1 tablet (10 mg total) by mouth 2 (two) times daily before a meal. 60 tablet 2  . glucose blood (ACCU-CHEK AVIVA) test strip Use as instructed 100 each 12  . glucose blood (TRUE METRIX BLOOD GLUCOSE TEST) test strip Use as instructed 100 each 12  . ibuprofen (ADVIL) 800 MG tablet Take 1 tablet (800 mg total) by mouth every 8 (eight) hours as needed. 30 tablet 3  . Insulin Glargine (BASAGLAR KWIKPEN) 100 UNIT/ML SOPN Inject 0.5 mLs (50 Units total) into the skin daily. 50 mL 6  . Insulin Pen Needle (BD ULTRA-FINE PEN NEEDLES) 29G X 12.7MM MISC ICD10 E11.9 for use with insulin administration 200 each 2  . Insulin Syringe-Needle U-100 (BD INSULIN SYRINGE ULTRAFINE) 31G X 5/16" 0.5 ML MISC 0.5 Units by Does not apply route daily. 10 each 11  . Lancets 30G MISC 1 each by Does not apply route as directed. 100 each 12  . lisinopril (ZESTRIL) 40 MG tablet TAKE 1 TABLET BY MOUTH DAILY 90 tablet 3  . metFORMIN (GLUCOPHAGE) 500 MG tablet Take 2 tablets (1,000 mg total) by mouth 2 (two) times daily with a meal. 180 tablet 2  . sulfamethoxazole-trimethoprim (BACTRIM DS) 800-160 MG tablet Take 1 tablet by mouth 2 (two) times daily. 14 tablet 0  . Vitamin D, Ergocalciferol, (DRISDOL) 1.25 MG (50000 UT) CAPS capsule Take 1 capsule (50,000 Units total) by mouth every 7 (seven) days. 5 capsule 3   No facility-administered medications prior to visit.    No Known Allergies  ROS Review of Systems  Constitutional: Negative.   HENT: Negative.   Eyes: Negative.   Respiratory: Negative.   Cardiovascular: Negative.   Gastrointestinal: Negative.   Endocrine: Negative.   Genitourinary: Negative.   Musculoskeletal: Negative.   Skin: Negative.   Allergic/Immunologic: Negative.   Neurological: Positive for dizziness (occasional ) and headaches (occassional ).  Hematological: Negative.   Psychiatric/Behavioral: Negative.       Objective:    Physical Exam  Constitutional: She is  oriented to person, place, and time. She appears well-developed and well-nourished.  HENT:  Head: Normocephalic and atraumatic.  Eyes: Conjunctivae are normal.  Cardiovascular: Normal rate, regular rhythm, normal heart sounds and intact distal pulses.  Pulmonary/Chest: Effort normal and breath sounds normal.  Abdominal: Soft. Bowel sounds are normal. She exhibits distension.  Musculoskeletal:        General: Normal range of motion.     Cervical back: Neck supple.  Neurological: She is alert and oriented to person, place, and time. She has normal reflexes.  Skin: Skin is warm.  Psychiatric: She has a normal mood and affect. Her behavior is normal. Judgment and thought content normal.  Nursing note  and vitals reviewed.   BP (!) 148/65   Pulse 94   Temp 98.5 F (36.9 C) (Oral)   Wt 192 lb 3.2 oz (87.2 kg)   SpO2 99%   BMI 35.15 kg/m  Wt Readings from Last 3 Encounters:  06/30/19 192 lb 3.2 oz (87.2 kg)  02/20/19 188 lb 9.6 oz (85.5 kg)  09/20/18 194 lb (88 kg)     Health Maintenance Due  Topic Date Due  . TETANUS/TDAP  Never done  . MAMMOGRAM  Never done  . COLONOSCOPY  Never done  . OPHTHALMOLOGY EXAM  08/20/2018  . INFLUENZA VACCINE  Never done  . FOOT EXAM  02/22/2019    There are no preventive care reminders to display for this patient.  Lab Results  Component Value Date   TSH 1.640 06/01/2018   Lab Results  Component Value Date   WBC 7.9 06/01/2018   HGB 12.6 06/01/2018   HCT 37.2 06/01/2018   MCV 82 06/01/2018   PLT 249 06/01/2018   Lab Results  Component Value Date   NA 140 06/01/2018   K 4.2 06/01/2018   CO2 22 06/01/2018   GLUCOSE 248 (H) 06/01/2018   BUN 12 06/01/2018   CREATININE 0.67 06/01/2018   BILITOT 0.4 06/01/2018   ALKPHOS 95 06/01/2018   AST 17 06/01/2018   ALT 21 06/01/2018   PROT 6.4 06/01/2018   ALBUMIN 4.0 06/01/2018   CALCIUM 9.1 06/01/2018   Lab Results  Component Value Date   CHOL 231 (H) 06/01/2018   Lab Results   Component Value Date   HDL 43 06/01/2018   Lab Results  Component Value Date   LDLCALC 156 (H) 06/01/2018   Lab Results  Component Value Date   TRIG 159 (H) 06/01/2018   Lab Results  Component Value Date   CHOLHDL 5.4 (H) 06/01/2018   Lab Results  Component Value Date   HGBA1C 10.2 (A) 06/30/2019      Assessment & Plan:   1. Type 2 diabetes mellitus with complication, with long-term current use of insulin ([place]) She will continue medication as prescribed, to decrease foods/beverages high in sugars and carbs and follow Heart Healthy or DASH diet. Increase physical activity to at least 30 minutes cardio exercise daily.  - POCT urinalysis dipstick - POCT glycosylated hemoglobin (Hb A1C) - POCT glucose (manual entry) - CBC with Differential - Comprehensive metabolic panel - Lipid Panel - TSH - Vitamin B12 - Vitamin D, 25-hydroxy  2. Hemoglobin A1C greater than 9%, indicating poor diabetic control Worsened. Hgb A1c is increased at 10.2 today, from 9.3 on 02/14/2019. Continue to monitor.   3. Hyperglycemia  4. Essential hypertension The current medical regimen is effective; blood pressure is stable at 148/65 today; continue present plan and medications as prescribed. She will continue to take medications as prescribed, to decrease high sodium intake, excessive alcohol intake, increase potassium intake, smoking cessation, and increase physical activity of at least 30 minutes of cardio activity daily. She will continue to follow Heart Healthy or DASH diet.  5. Grief We will initiate a trial Alprazolam today.  - ALPRAZolam (XANAX) 0.5 MG tablet; Take 1 tablet (0.5 mg total) by mouth at bedtime as needed for anxiety.  Dispense: 30 tablet; Refill: 0  6. Insomnia, unspecified type - ALPRAZolam (XANAX) 0.5 MG tablet; Take 1 tablet (0.5 mg total) by mouth at bedtime as needed for anxiety.  Dispense: 30 tablet; Refill: 0  7. Anxiety Moderate today.   8. Abnormal urine  Results  are pending. - Urine Culture  9. Follow up She will follow up in 6 months.  Meds ordered this encounter  Medications  . ALPRAZolam (XANAX) 0.5 MG tablet    Sig: Take 1 tablet (0.5 mg total) by mouth at bedtime as needed for anxiety.    Dispense:  30 tablet    Refill:  0    Orders Placed This Encounter  Procedures  . Urine Culture  . CBC with Differential  . Comprehensive metabolic panel  . Lipid Panel  . TSH  . Vitamin B12  . Vitamin D, 25-hydroxy  . POCT urinalysis dipstick  . POCT glycosylated hemoglobin (Hb A1C)  . POCT glucose (manual entry)    Referral Orders  No referral(s) requested today    Kathe Becton,  MSN, FNP-BC [place], [place] [address] [phone] fax  Problem List Items Addressed This Visit      Cardiovascular and Mediastinum   Essential hypertension     Endocrine   Hemoglobin A1C greater than 9%, indicating poor diabetic control   Type 2 diabetes mellitus with complication, with long-term current use of insulin ([facility]) - Primary   Relevant Orders   POCT urinalysis dipstick (Completed)   POCT glycosylated hemoglobin (Hb A1C) (Completed)   POCT glucose (manual entry) (Completed)   CBC with Differential   Comprehensive metabolic panel   Lipid Panel   TSH   Vitamin B12   Vitamin D, 25-hydroxy     Other   Anxiety   Relevant Medications   ALPRAZolam (XANAX) 0.5 MG tablet   Hyperglycemia    Other Visit Diagnoses    Grief       Relevant Medications   ALPRAZolam (XANAX) 0.5 MG tablet   Insomnia, unspecified type       Relevant Medications   ALPRAZolam (XANAX) 0.5 MG tablet   Abnormal urine       Relevant Orders   Urine Culture   Follow up          Meds ordered this encounter  Medications  . ALPRAZolam (XANAX) 0.5 MG tablet    Sig: Take 1 tablet (0.5 mg total) by mouth at bedtime as needed for anxiety.    Dispense:  30  tablet    Refill:  0    Follow-up: No follow-ups on file.    Azzie Glatter, FNP

## 2019-07-01 LAB — COMPREHENSIVE METABOLIC PANEL
ALT: 21 IU/L (ref 0–32)
AST: 20 IU/L (ref 0–40)
Albumin/Globulin Ratio: 1.8 (ref 1.2–2.2)
Albumin: 4 g/dL (ref 3.8–4.9)
Alkaline Phosphatase: 85 IU/L (ref 39–117)
BUN/Creatinine Ratio: 18 (ref 9–23)
BUN: 14 mg/dL (ref 6–24)
Bilirubin Total: 0.3 mg/dL (ref 0.0–1.2)
CO2: 22 mmol/L (ref 20–29)
Calcium: 9.3 mg/dL (ref 8.7–10.2)
Chloride: 101 mmol/L (ref 96–106)
Creatinine, Ser: 0.78 mg/dL (ref 0.57–1.00)
GFR calc Af Amer: 98 mL/min/{1.73_m2} (ref >59)
GFR calc non Af Amer: 85 mL/min/{1.73_m2} (ref >59)
Globulin, Total: 2.2 g/dL (ref 1.5–4.5)
Glucose: 251 mg/dL — ABNORMAL HIGH (ref 65–99)
Potassium: 4.6 mmol/L (ref 3.5–5.2)
Sodium: 137 mmol/L (ref 134–144)
Total Protein: 6.2 g/dL (ref 6.0–8.5)

## 2019-07-01 LAB — LIPID PANEL
Chol/HDL Ratio: 4.9 ratio — ABNORMAL HIGH (ref 0.0–4.4)
Cholesterol, Total: 232 mg/dL — ABNORMAL HIGH (ref 100–199)
HDL: 47 mg/dL (ref >39)
LDL Chol Calc (NIH): 132 mg/dL — ABNORMAL HIGH (ref 0–99)
Triglycerides: 297 mg/dL — ABNORMAL HIGH (ref 0–149)
VLDL Cholesterol Cal: 53 mg/dL — ABNORMAL HIGH (ref 5–40)

## 2019-07-01 LAB — CBC WITH DIFFERENTIAL/PLATELET
Basophils Absolute: 0.1 10*3/uL (ref 0.0–0.2)
Basos: 1 %
EOS (ABSOLUTE): 0.5 10*3/uL — ABNORMAL HIGH (ref 0.0–0.4)
Eos: 8 %
Hematocrit: 38 % (ref 34.0–46.6)
Hemoglobin: 12.6 g/dL (ref 11.1–15.9)
Immature Grans (Abs): 0 10*3/uL (ref 0.0–0.1)
Immature Granulocytes: 0 %
Lymphocytes Absolute: 1.6 10*3/uL (ref 0.7–3.1)
Lymphs: 26 %
MCH: 28.1 pg (ref 26.6–33.0)
MCHC: 33.2 g/dL (ref 31.5–35.7)
MCV: 85 fL (ref 79–97)
Monocytes Absolute: 0.5 10*3/uL (ref 0.1–0.9)
Monocytes: 7 %
Neutrophils Absolute: 3.5 10*3/uL (ref 1.4–7.0)
Neutrophils: 58 %
Platelets: 220 10*3/uL (ref 150–450)
RBC: 4.48 x10E6/uL (ref 3.77–5.28)
RDW: 12.6 % (ref 11.7–15.4)
WBC: 6.2 10*3/uL (ref 3.4–10.8)

## 2019-07-01 LAB — VITAMIN D 25 HYDROXY (VIT D DEFICIENCY, FRACTURES): Vit D, 25-Hydroxy: 10.1 ng/mL — ABNORMAL LOW (ref 30.0–100.0)

## 2019-07-01 LAB — VITAMIN B12: Vitamin B-12: 546 pg/mL (ref 232–1245)

## 2019-07-01 LAB — TSH: TSH: 1.04 u[IU]/mL (ref 0.450–4.500)

## 2019-07-03 LAB — URINE CULTURE

## 2019-07-04 ENCOUNTER — Other Ambulatory Visit: Payer: Self-pay | Admitting: Family Medicine

## 2019-07-04 ENCOUNTER — Encounter: Payer: Self-pay | Admitting: Family Medicine

## 2019-07-04 DIAGNOSIS — E559 Vitamin D deficiency, unspecified: Secondary | ICD-10-CM

## 2019-07-04 DIAGNOSIS — E785 Hyperlipidemia, unspecified: Secondary | ICD-10-CM

## 2019-07-04 MED ORDER — ATORVASTATIN CALCIUM 10 MG PO TABS: 10.0000 mg | ORAL_TABLET | Freq: Every day | ORAL | 6 refills | Status: DC

## 2019-07-04 MED ORDER — VITAMIN D (ERGOCALCIFEROL) 1.25 MG (50000 UNIT) PO CAPS: 50000.0000 [IU] | ORAL_CAPSULE | ORAL | 6 refills | Status: DC

## 2019-07-05 ENCOUNTER — Telehealth: Payer: Self-pay | Admitting: Family Medicine

## 2019-07-05 MED FILL — VIT D2 1.25 MG (50,000 UNIT: 1.25 MG | 35 days supply | Qty: 5 | Fill #0

## 2019-07-05 MED FILL — ATORVASTATIN 10 MG TABLET: 10 | 30 days supply | Qty: 30 | Fill #0

## 2019-07-06 ENCOUNTER — Telehealth: Payer: Self-pay

## 2019-07-06 NOTE — Telephone Encounter (Signed)
No voice ID on phone. Patient informed of 2 Rx'x  available. Please call back for more details.

## 2019-07-06 NOTE — Telephone Encounter (Signed)
Message left for call back if needed.

## 2019-07-06 NOTE — Telephone Encounter (Signed)
-----   Message from Kallie Locks, FNP sent at 07/04/2019  7:47 PM EDT ----- Cholesterol levels are mildly elevated. Rx refill for Atorvastatin sent to pharmacy today.   Continue low-fat, low cholesterol diet, increase fluids, increase fruits and vegetables. She will continue to decrease high sodium intake, excessive alcohol intake, increase potassium intake, smoking cessation, and increase physical activity of at least 30 minutes of cardio activity daily. She will continue to follow Heart Healthy or DASH diet.  Vitamin D level is extremely low.   Rx refill for Vitamin D supplement sent to pharmacy today. She should include foods that are high in Vitamin D. These include: Salmon, Cod Liver Oil, Mushrooms, Canned Fish, Milk, and Egg Yolks.  All other labs are stable. Keep follow up appointment. Please inform patient.

## 2019-07-10 ENCOUNTER — Other Ambulatory Visit: Payer: Self-pay

## 2019-07-10 MED ORDER — "INSULIN SYRINGE-NEEDLE U-100 31G X 5/16"" 0.5 ML MISC": 0.5000 [IU] | Freq: Every day | 11 refills | Status: DC

## 2019-07-10 MED FILL — TRUEPLUS SYR 1ML 31GX5/16: 31G X 5/16" | 90 days supply | Qty: 100 | Fill #0

## 2019-07-13 DIAGNOSIS — M79605 Pain in left leg: Secondary | ICD-10-CM

## 2019-07-13 DIAGNOSIS — M79604 Pain in right leg: Secondary | ICD-10-CM

## 2019-07-13 DIAGNOSIS — R600 Localized edema: Secondary | ICD-10-CM

## 2019-07-13 HISTORY — DX: Localized edema: R60.0

## 2019-07-13 HISTORY — DX: Pain in right leg: M79.604

## 2019-07-13 HISTORY — DX: Pain in left leg: M79.605

## 2019-07-19 MED FILL — LISINOPRIL 40 MG TABLET: 40 | 30 days supply | Qty: 30 | Fill #8

## 2019-07-25 ENCOUNTER — Ambulatory Visit (INDEPENDENT_AMBULATORY_CARE_PROVIDER_SITE_OTHER): Payer: Self-pay | Admitting: Family Medicine

## 2019-07-25 ENCOUNTER — Ambulatory Visit (HOSPITAL_COMMUNITY)
Admission: RE | Admit: 2019-07-25 | Discharge: 2019-07-25 | Disposition: A | Discharge status: Home / Self Care | Payer: Self-pay | Source: Ambulatory Visit | Attending: Family Medicine | Admitting: Family Medicine

## 2019-07-25 ENCOUNTER — Other Ambulatory Visit: Payer: Self-pay

## 2019-07-25 ENCOUNTER — Encounter: Payer: Self-pay | Admitting: Family Medicine

## 2019-07-25 VITALS — BP 159/72 | HR 80 | Temp 98.4°F | Ht 62.0 in | Wt 190.4 lb

## 2019-07-25 DIAGNOSIS — Z09 Encounter for follow-up examination after completed treatment for conditions other than malignant neoplasm: Secondary | ICD-10-CM

## 2019-07-25 DIAGNOSIS — M7989 Other specified soft tissue disorders: Secondary | ICD-10-CM

## 2019-07-25 DIAGNOSIS — M79604 Pain in right leg: Secondary | ICD-10-CM

## 2019-07-25 DIAGNOSIS — I10 Essential (primary) hypertension: Secondary | ICD-10-CM

## 2019-07-25 DIAGNOSIS — E118 Type 2 diabetes mellitus with unspecified complications: Secondary | ICD-10-CM

## 2019-07-25 DIAGNOSIS — F419 Anxiety disorder, unspecified: Secondary | ICD-10-CM

## 2019-07-25 DIAGNOSIS — Z794 Long term (current) use of insulin: Secondary | ICD-10-CM

## 2019-07-25 DIAGNOSIS — R7309 Other abnormal glucose: Secondary | ICD-10-CM

## 2019-07-25 LAB — GLUCOSE, POCT (MANUAL RESULT ENTRY): POC Glucose: 307 mg/dl — AB (ref 70–99)

## 2019-07-25 NOTE — Progress Notes (Signed)
Patient [facility]   Sick Visit  Subjective:  Patient ID: Nyari Olsson, female    DOB: 10/10/1960  Age: 59 y.o. MRN: [id number]  CC:  Chief Complaint  Patient presents with  . Leg Pain    RIGHT CALF PAIN, HURT TO WALK    HPI Katherina Wimer is a 59 year old female who presents for Sick Visit today.   Past Medical History:  Diagnosis Date  . Diabetes mellitus without complication ([place])   . Hyperlipidemia   . Hypertension   . Vitamin D deficiency     Current Status: Since her last office visit, she has had right lower leg pain and swelling X 1 week now.  She denies fatigue, frequent urination, blurred vision, excessive hunger, excessive thirst, weight gain, weight loss, and poor wound healing. She continues to check* ** feet regularly. She denies visual changes, chest pain, cough, shortness of breath, heart palpitations, and falls. She has occasional headaches and dizziness with position changes. Denies severe headaches, confusion, seizures, double vision, and blurred vision, nausea and vomiting. She denies fevers, chills, recent infections, weight loss, and night sweats. No reports of GI problems such as diarrhea, and constipation. She has no reports of blood in stools, dysuria and hematuria. No depression or anxiety, and denies suicidal ideations, homicidal ideations, or auditory hallucinations. She is taking all medications as prescribed.   Past Surgical History:  Procedure Laterality Date  . APPENDECTOMY    . CARPAL TUNNEL RELEASE Bilateral   . CHOLECYSTECTOMY    . WRIST SURGERY Bilateral     Family History  Problem Relation Age of Onset  . Diabetes Mother   . Dementia Mother   . Diabetes Father   . Hypertension Father     Social History   Socioeconomic History  . Marital status: Single    Spouse name: Not on file  . Number of children: Not on file  . Years of education: Not on file  . Highest education level: Not on file   Occupational History  . Not on file  Tobacco Use  . Smoking status: Never Smoker  . Smokeless tobacco: Never Used  Substance and Sexual Activity  . Alcohol use: No  . Drug use: No  . Sexual activity: Yes    Partners: Male  Other Topics Concern  . Not on file  Social History Narrative  . Not on file   Social Determinants of Health   Financial Resource Strain:   . Difficulty of Paying Living Expenses:   Food Insecurity:   . Worried About Charity fundraiser in the Last Year:   . Arboriculturist in the Last Year:   Transportation Needs:   . Film/video editor (Medical):   Marland Kitchen Lack of Transportation (Non-Medical):   Physical Activity:   . Days of Exercise per Week:   . Minutes of Exercise per Session:   Stress:   . Feeling of Stress :   Social Connections:   . Frequency of Communication with Friends and Family:   . Frequency of Social Gatherings with Friends and Family:   . Attends Religious Services:   . Active Member of Clubs or Organizations:   . Attends Archivist Meetings:   Marland Kitchen Marital Status:   Intimate Partner Violence:   . Fear of Current or Ex-Partner:   . Emotionally Abused:   Marland Kitchen Physically Abused:   . Sexually Abused:     Outpatient Medications Prior to  Visit  Medication Sig Dispense Refill  . albuterol (PROVENTIL HFA;VENTOLIN HFA) 108 (90 Base) MCG/ACT inhaler Inhale 2 puffs into the lungs every 6 (six) hours as needed for wheezing or shortness of breath. 1 Inhaler 12  . ALPRAZolam (XANAX) 0.5 MG tablet Take 1 tablet (0.5 mg total) by mouth at bedtime as needed for anxiety. 30 tablet 0  . atorvastatin (LIPITOR) 10 MG tablet Take 1 tablet (10 mg total) by mouth daily. 30 tablet 6  . blood glucose meter kit and supplies KIT Dispense based on patient and insurance preference. 100 each 12  . busPIRone (BUSPAR) 10 MG tablet Take 1 tablet (10 mg total) by mouth 3 (three) times daily. 60 tablet 3  . cetirizine (ZYRTEC) 10 MG tablet Take 1 tablet (10 mg  total) by mouth daily. 30 tablet 2  . diclofenac (VOLTAREN) 75 MG EC tablet Take 1 tablet (75 mg total) by mouth at bedtime. 30 tablet 1  . glipiZIDE (GLUCOTROL) 10 MG tablet Take 1 tablet (10 mg total) by mouth 2 (two) times daily before a meal. 60 tablet 2  . glucose blood (ACCU-CHEK AVIVA) test strip Use as instructed 100 each 12  . glucose blood (TRUE METRIX BLOOD GLUCOSE TEST) test strip Use as instructed 100 each 12  . Insulin Glargine (BASAGLAR KWIKPEN) 100 UNIT/ML SOPN Inject 0.5 mLs (50 Units total) into the skin daily. 50 mL 6  . Insulin Pen Needle (BD ULTRA-FINE PEN NEEDLES) 29G X 12.7MM MISC ICD10 E11.9 for use with insulin administration 200 each 2  . Insulin Syringe-Needle U-100 (BD INSULIN SYRINGE ULTRAFINE) 31G X 5/16" 0.5 ML MISC 0.5 Units by Does not apply route daily. 10 each 11  . Lancets 30G MISC 1 each by Does not apply route as directed. 100 each 12  . lisinopril (ZESTRIL) 40 MG tablet TAKE 1 TABLET BY MOUTH DAILY 90 tablet 3  . sulfamethoxazole-trimethoprim (BACTRIM DS) 800-160 MG tablet Take 1 tablet by mouth 2 (two) times daily. 14 tablet 0  . Vitamin D, Ergocalciferol, (DRISDOL) 1.25 MG (50000 UNIT) CAPS capsule Take 1 capsule (50,000 Units total) by mouth every 7 (seven) days. 5 capsule 6  . ibuprofen (ADVIL) 800 MG tablet Take 1 tablet (800 mg total) by mouth every 8 (eight) hours as needed. 30 tablet 3  . metFORMIN (GLUCOPHAGE) 500 MG tablet Take 2 tablets (1,000 mg total) by mouth 2 (two) times daily with a meal. 180 tablet 2   No facility-administered medications prior to visit.    No Known Allergies  ROS Review of Systems  Constitutional: Negative.   HENT: Negative.   Eyes: Negative.   Respiratory: Negative.   Cardiovascular: Positive for leg swelling (righf leg pain and swellling. ).  Gastrointestinal: Negative.   Endocrine: Negative.   Genitourinary: Negative.   Musculoskeletal: Negative.   Skin: Negative.   Allergic/Immunologic: Negative.    Neurological: Positive for dizziness (occasional ) and headaches (occasional ).  Hematological: Negative.   Psychiatric/Behavioral: Negative.       Objective:    Physical Exam  Constitutional: She is oriented to person, place, and time. She appears well-developed and well-nourished.  HENT:  Head: Normocephalic and atraumatic.  Eyes: Conjunctivae are normal.  Cardiovascular: Normal rate, regular rhythm, normal heart sounds and intact distal pulses.  Pulmonary/Chest: Effort normal and breath sounds normal.  Abdominal: Soft. Bowel sounds are normal.  Musculoskeletal:        General: Normal range of motion.     Cervical back: Normal range of motion  and neck supple.  Neurological: She is alert and oriented to person, place, and time. She has normal reflexes.  Skin: Skin is warm and dry.  Psychiatric: She has a normal mood and affect. Her behavior is normal. Judgment and thought content normal.  Nursing note and vitals reviewed.   BP (!) 159/72   Pulse 80   Temp 98.4 F (36.9 C) (Oral)   Ht '5\' 2"'$  (1.575 m)   Wt 190 lb 6.4 oz (86.4 kg)   SpO2 100%   BMI 34.82 kg/m  Wt Readings from Last 3 Encounters:  07/25/19 190 lb 6.4 oz (86.4 kg)  06/30/19 192 lb 3.2 oz (87.2 kg)  02/20/19 188 lb 9.6 oz (85.5 kg)     Health Maintenance Due  Topic Date Due  . COVID-19 Vaccine (1) Never done  . TETANUS/TDAP  Never done  . MAMMOGRAM  Never done  . COLONOSCOPY  Never done  . OPHTHALMOLOGY EXAM  08/20/2018  . FOOT EXAM  02/22/2019    There are no preventive care reminders to display for this patient.  Lab Results  Component Value Date   TSH 1.040 06/30/2019   Lab Results  Component Value Date   WBC 6.2 06/30/2019   HGB 12.6 06/30/2019   HCT 38.0 06/30/2019   MCV 85 06/30/2019   PLT 220 06/30/2019   Lab Results  Component Value Date   NA 137 06/30/2019   K 4.6 06/30/2019   CO2 22 06/30/2019   GLUCOSE 251 (H) 06/30/2019   BUN 14 06/30/2019   CREATININE 0.78 06/30/2019    BILITOT 0.3 06/30/2019   ALKPHOS 85 06/30/2019   AST 20 06/30/2019   ALT 21 06/30/2019   PROT 6.2 06/30/2019   ALBUMIN 4.0 06/30/2019   CALCIUM 9.3 06/30/2019   Lab Results  Component Value Date   CHOL 232 (H) 06/30/2019   Lab Results  Component Value Date   HDL 47 06/30/2019   Lab Results  Component Value Date   LDLCALC 132 (H) 06/30/2019   Lab Results  Component Value Date   TRIG 297 (H) 06/30/2019   Lab Results  Component Value Date   CHOLHDL 4.9 (H) 06/30/2019   Lab Results  Component Value Date   HGBA1C 10.2 (A) 06/30/2019      Assessment & Plan:   1. Type 2 diabetes mellitus with complication, with long-term current use of insulin ([place]) She will continue medication as prescribed, to decrease foods/beverages high in sugars and carbs and follow Heart Healthy or DASH diet. Increase physical activity to at least 30 minutes cardio exercise daily.  - POCT glucose (manual entry)  2. Hemoglobin A1C greater than 9%, indicating poor diabetic control Increased at 10.2 today, from 9.3 on 02/20/2020  3. Pain and swelling of lower extremity, right - VAS [place] LOWER EXTREMITY VENOUS (DVT); Future  4. Acute pain of right lower extremity - VAS [place] LOWER EXTREMITY VENOUS (DVT); Future  5. Essential hypertension Blood pressure is stable today. She will continue to take medications as prescribed, to decrease high sodium intake, excessive alcohol intake, increase potassium intake, smoking cessation, and increase physical activity of at least 30 minutes of cardio activity daily. She will continue to follow Heart Healthy or DASH diet.  6. Anxiety  7. Follow up She will keep follow up appointment.   No orders of the defined types were placed in this encounter.   Orders Placed This Encounter  Procedures  . POCT glucose (manual entry)  . VAS [place] LOWER  EXTREMITY VENOUS (DVT)    Referral Orders  No referral(s) requested today    Kathe Becton,  MSN, FNP-BC [facility] [address] [place], [place] [address] [phone] fax  Problem List Items Addressed This Visit      Cardiovascular and Mediastinum   Essential hypertension     Endocrine   Hemoglobin A1C greater than 9%, indicating poor diabetic control   Type 2 diabetes mellitus with complication, with long-term current use of insulin ([facility]) - Primary   Relevant Orders   POCT glucose (manual entry) (Completed)     Other   Anxiety    Other Visit Diagnoses    Pain and swelling of lower extremity, right       Relevant Orders   VAS [place] LOWER EXTREMITY VENOUS (DVT) (Completed)   Acute pain of right lower extremity       Relevant Orders   VAS [place] LOWER EXTREMITY VENOUS (DVT) (Completed)   Follow up          No orders of the defined types were placed in this encounter.   Follow-up: No follow-ups on file.    Azzie Glatter, FNP

## 2019-07-25 NOTE — Progress Notes (Signed)
Right lower extremity venous duplex has been completed. Preliminary results can be found in CV Proc through chart review.  Results were given to Raliegh Ip FNP.  07/25/19 1:08 PM Olen Cordial RVT

## 2019-07-26 ENCOUNTER — Telehealth: Payer: Self-pay | Admitting: Family Medicine

## 2019-07-26 ENCOUNTER — Other Ambulatory Visit: Payer: Self-pay | Admitting: Family Medicine

## 2019-07-26 DIAGNOSIS — E118 Type 2 diabetes mellitus with unspecified complications: Secondary | ICD-10-CM

## 2019-07-26 MED FILL — METFORMIN HCL 500 MG TABS: 500 | 15 days supply | Qty: 60 | Fill #0

## 2019-07-27 ENCOUNTER — Other Ambulatory Visit: Payer: Self-pay | Admitting: Family Medicine

## 2019-07-27 DIAGNOSIS — M79604 Pain in right leg: Secondary | ICD-10-CM

## 2019-07-27 DIAGNOSIS — S66912A Strain of unspecified muscle, fascia and tendon at wrist and hand level, left hand, initial encounter: Secondary | ICD-10-CM

## 2019-07-27 MED ORDER — IBUPROFEN 800 MG PO TABS: 800.0000 mg | ORAL_TABLET | Freq: Three times a day (TID) | ORAL | 3 refills | Status: DC | PRN

## 2019-07-27 MED FILL — IBUPROFEN 800 MG TABLET: 800 | 10 days supply | Qty: 30 | Fill #0

## 2019-08-01 MED FILL — LANTUS 100 UNITS/ML VIAL: 100 | 20 days supply | Qty: 10 | Fill #5

## 2019-08-01 NOTE — Telephone Encounter (Signed)
Done

## 2019-08-16 ENCOUNTER — Encounter: Payer: Self-pay | Admitting: Family Medicine

## 2019-08-16 ENCOUNTER — Other Ambulatory Visit: Payer: Self-pay

## 2019-08-16 ENCOUNTER — Ambulatory Visit (INDEPENDENT_AMBULATORY_CARE_PROVIDER_SITE_OTHER): Payer: Self-pay | Admitting: Family Medicine

## 2019-08-16 VITALS — BP 154/82 | HR 82 | Temp 98.8°F | Ht 62.0 in | Wt 194.2 lb

## 2019-08-16 DIAGNOSIS — E118 Type 2 diabetes mellitus with unspecified complications: Secondary | ICD-10-CM

## 2019-08-16 DIAGNOSIS — M79605 Pain in left leg: Secondary | ICD-10-CM

## 2019-08-16 DIAGNOSIS — M79604 Pain in right leg: Secondary | ICD-10-CM

## 2019-08-16 DIAGNOSIS — R7309 Other abnormal glucose: Secondary | ICD-10-CM

## 2019-08-16 DIAGNOSIS — R8290 Unspecified abnormal findings in urine: Secondary | ICD-10-CM

## 2019-08-16 DIAGNOSIS — R739 Hyperglycemia, unspecified: Secondary | ICD-10-CM

## 2019-08-16 DIAGNOSIS — F419 Anxiety disorder, unspecified: Secondary | ICD-10-CM

## 2019-08-16 DIAGNOSIS — Z794 Long term (current) use of insulin: Secondary | ICD-10-CM

## 2019-08-16 DIAGNOSIS — N3000 Acute cystitis without hematuria: Secondary | ICD-10-CM

## 2019-08-16 DIAGNOSIS — I10 Essential (primary) hypertension: Secondary | ICD-10-CM

## 2019-08-16 DIAGNOSIS — Z09 Encounter for follow-up examination after completed treatment for conditions other than malignant neoplasm: Secondary | ICD-10-CM

## 2019-08-16 DIAGNOSIS — R600 Localized edema: Secondary | ICD-10-CM

## 2019-08-16 LAB — POCT URINALYSIS DIPSTICK
Bilirubin, UA: NEGATIVE
Glucose, UA: POSITIVE — AB
Ketones, UA: NEGATIVE
Nitrite, UA: NEGATIVE
Protein, UA: NEGATIVE
Spec Grav, UA: 1.015 (ref 1.010–1.025)
Urobilinogen, UA: 0.2 E.U./dL
pH, UA: 5.5 (ref 5.0–8.0)

## 2019-08-16 LAB — GLUCOSE, POCT (MANUAL RESULT ENTRY): POC Glucose: 247 mg/dl — AB (ref 70–99)

## 2019-08-16 MED ORDER — FUROSEMIDE 20 MG PO TABS: 20.0000 mg | ORAL_TABLET | Freq: Every day | ORAL | 3 refills | Status: DC

## 2019-08-16 MED ORDER — NITROFURANTOIN MONOHYD MACRO 100 MG PO CAPS: 100.0000 mg | ORAL_CAPSULE | Freq: Two times a day (BID) | ORAL | 0 refills | Status: AC

## 2019-08-16 MED FILL — NITROFURANTOIN MONO-MCR 100: 100 | 7 days supply | Qty: 14 | Fill #0

## 2019-08-16 MED FILL — FUROSEMIDE 20 MG TABS: 20 | 30 days supply | Qty: 30 | Fill #0

## 2019-08-16 NOTE — Progress Notes (Signed)
Patient [facility]   Established Patient Office Visit  Subjective:  Patient ID: Wanda Weaver, female    DOB: Jul 04, 1960  Age: 59 y.o. MRN: [id number]  CC:  Chief Complaint  Patient presents with  . Follow-up    Leg pain    HPI Wanda Weaver is a 59 year old female who presents for Follow Up today.   Past Medical History:  Diagnosis Date  . Diabetes mellitus without complication ([place])   . Hyperlipidemia   . Hypertension   . Vitamin D deficiency    Current Status: Since her last office visit, she is doing well with no complaints. She denies visual changes, chest pain, cough, shortness of breath, heart palpitations, and falls. She has occasional headaches and dizziness with position changes. Denies severe headaches, confusion, seizures, double vision, and blurred vision, nausea and vomiting. She most recent normal range of preprandial blood glucose levels have been between 164-174. She has seen low range of 160 and high of 400 since his last office visit. She denies fatigue, frequent urination, blurred vision, excessive hunger, excessive thirst, weight gain, weight loss, and poor wound healing. She continues to check her feet regularly. She denies fevers, chills, recent infections, weight loss, and night sweats. Denies GI problems such as nausea, vomiting, diarrhea, and constipation. She has no reports of blood in stools, dysuria and hematuria. No depression or anxiety, and denies suicidal ideations, homicidal ideations, or auditory hallucinations. She is taking all medications as prescribed. She denies pain today.   Past Surgical History:  Procedure Laterality Date  . APPENDECTOMY    . CARPAL TUNNEL RELEASE Bilateral   . CHOLECYSTECTOMY    . WRIST SURGERY Bilateral     Family History  Problem Relation Age of Onset  . Diabetes Mother   . Dementia Mother   . Diabetes Father   . Hypertension Father     Social History   Socioeconomic History    . Marital status: Single    Spouse name: Not on file  . Number of children: Not on file  . Years of education: Not on file  . Highest education level: Not on file  Occupational History  . Not on file  Tobacco Use  . Smoking status: Never Smoker  . Smokeless tobacco: Never Used  Substance and Sexual Activity  . Alcohol use: No  . Drug use: No  . Sexual activity: Yes    Partners: Male  Other Topics Concern  . Not on file  Social History Narrative  . Not on file   Social Determinants of Health   Financial Resource Strain:   . Difficulty of Paying Living Expenses:   Food Insecurity:   . Worried About Charity fundraiser in the Last Year:   . Arboriculturist in the Last Year:   Transportation Needs:   . Film/video editor (Medical):   Marland Kitchen Lack of Transportation (Non-Medical):   Physical Activity:   . Days of Exercise per Week:   . Minutes of Exercise per Session:   Stress:   . Feeling of Stress :   Social Connections:   . Frequency of Communication with Friends and Family:   . Frequency of Social Gatherings with Friends and Family:   . Attends Religious Services:   . Active Member of Clubs or Organizations:   . Attends Archivist Meetings:   Marland Kitchen Marital Status:   Intimate Partner Violence:   . Fear of Current or  Ex-Partner:   . Emotionally Abused:   Marland Kitchen Physically Abused:   . Sexually Abused:     Outpatient Medications Prior to Visit  Medication Sig Dispense Refill  . albuterol (PROVENTIL HFA;VENTOLIN HFA) 108 (90 Base) MCG/ACT inhaler Inhale 2 puffs into the lungs every 6 (six) hours as needed for wheezing or shortness of breath. 1 Inhaler 12  . atorvastatin (LIPITOR) 10 MG tablet Take 1 tablet (10 mg total) by mouth daily. 30 tablet 6  . blood glucose meter kit and supplies KIT Dispense based on patient and insurance preference. 100 each 12  . busPIRone (BUSPAR) 10 MG tablet Take 1 tablet (10 mg total) by mouth 3 (three) times daily. 60 tablet 3  .  glipiZIDE (GLUCOTROL) 10 MG tablet Take 1 tablet (10 mg total) by mouth 2 (two) times daily before a meal. 60 tablet 2  . glucose blood (ACCU-CHEK AVIVA) test strip Use as instructed 100 each 12  . glucose blood (TRUE METRIX BLOOD GLUCOSE TEST) test strip Use as instructed 100 each 12  . ibuprofen (ADVIL) 800 MG tablet Take 1 tablet (800 mg total) by mouth every 8 (eight) hours as needed. 30 tablet 3  . Insulin Glargine (BASAGLAR KWIKPEN) 100 UNIT/ML SOPN Inject 0.5 mLs (50 Units total) into the skin daily. 50 mL 6  . Insulin Pen Needle (BD ULTRA-FINE PEN NEEDLES) 29G X 12.7MM MISC ICD10 E11.9 for use with insulin administration 200 each 2  . Insulin Syringe-Needle U-100 (BD INSULIN SYRINGE ULTRAFINE) 31G X 5/16" 0.5 ML MISC 0.5 Units by Does not apply route daily. 10 each 11  . Lancets 30G MISC 1 each by Does not apply route as directed. 100 each 12  . lisinopril (ZESTRIL) 40 MG tablet TAKE 1 TABLET BY MOUTH DAILY 90 tablet 3  . metFORMIN (GLUCOPHAGE) 500 MG tablet TAKE 2 TABLETS (1,000 MG TOTAL) BY MOUTH 2 (TWO) TIMES DAILY WITH A MEAL. 60 tablet 2  . Vitamin D, Ergocalciferol, (DRISDOL) 1.25 MG (50000 UNIT) CAPS capsule Take 1 capsule (50,000 Units total) by mouth every 7 (seven) days. 5 capsule 6  . ALPRAZolam (XANAX) 0.5 MG tablet Take 1 tablet (0.5 mg total) by mouth at bedtime as needed for anxiety. 30 tablet 0  . cetirizine (ZYRTEC) 10 MG tablet Take 1 tablet (10 mg total) by mouth daily. (Patient not taking: Reported on 08/16/2019) 30 tablet 2  . diclofenac (VOLTAREN) 75 MG EC tablet Take 1 tablet (75 mg total) by mouth at bedtime. (Patient not taking: Reported on 08/16/2019) 30 tablet 1  . sulfamethoxazole-trimethoprim (BACTRIM DS) 800-160 MG tablet Take 1 tablet by mouth 2 (two) times daily. (Patient not taking: Reported on 08/16/2019) 14 tablet 0   No facility-administered medications prior to visit.    No Known Allergies  ROS Review of Systems  Constitutional: Negative.   HENT:  Negative.   Eyes: Negative.   Respiratory: Negative.   Cardiovascular: Negative.   Gastrointestinal: Positive for abdominal distention.  Endocrine: Negative.   Genitourinary: Negative.   Musculoskeletal: Negative.   Skin: Negative.   Allergic/Immunologic: Negative.   Neurological: Positive for dizziness (occasional) and headaches (occasional ).  Hematological: Negative.   Psychiatric/Behavioral: Negative.       Objective:    Physical Exam  Constitutional: She is oriented to person, place, and time. She appears well-developed and well-nourished.  HENT:  Head: Normocephalic and atraumatic.  Eyes: Conjunctivae are normal.  Neck: Tracheal deviation present.  Cardiovascular: Normal rate, regular rhythm, normal heart sounds and intact distal  pulses.  Pulmonary/Chest: Effort normal and breath sounds normal.  Abdominal: Soft. Bowel sounds are normal.  Musculoskeletal:        General: Edema (bilateral lower extremity ) present. Normal range of motion.     Cervical back: Normal range of motion and neck supple.  Neurological: She is alert and oriented to person, place, and time. She has normal reflexes.  Skin: Skin is warm and dry.  Psychiatric: She has a normal mood and affect. Her behavior is normal. Judgment and thought content normal.  Nursing note and vitals reviewed.   BP (!) 154/82   Pulse 82   Temp 98.8 F (37.1 C)   Ht '5\' 2"'$  (1.575 m)   Wt 194 lb 3.2 oz (88.1 kg)   SpO2 100%   BMI 35.52 kg/m  Wt Readings from Last 3 Encounters:  08/16/19 194 lb 3.2 oz (88.1 kg)  07/25/19 190 lb 6.4 oz (86.4 kg)  06/30/19 192 lb 3.2 oz (87.2 kg)     Health Maintenance Due  Topic Date Due  . COVID-19 Vaccine (1) Never done  . TETANUS/TDAP  Never done  . MAMMOGRAM  Never done  . COLONOSCOPY  Never done  . OPHTHALMOLOGY EXAM  08/20/2018  . FOOT EXAM  02/22/2019    There are no preventive care reminders to display for this patient.  Lab Results  Component Value Date   TSH  1.040 06/30/2019   Lab Results  Component Value Date   WBC 6.2 06/30/2019   HGB 12.6 06/30/2019   HCT 38.0 06/30/2019   MCV 85 06/30/2019   PLT 220 06/30/2019   Lab Results  Component Value Date   NA 137 06/30/2019   K 4.6 06/30/2019   CO2 22 06/30/2019   GLUCOSE 251 (H) 06/30/2019   BUN 14 06/30/2019   CREATININE 0.78 06/30/2019   BILITOT 0.3 06/30/2019   ALKPHOS 85 06/30/2019   AST 20 06/30/2019   ALT 21 06/30/2019   PROT 6.2 06/30/2019   ALBUMIN 4.0 06/30/2019   CALCIUM 9.3 06/30/2019   Lab Results  Component Value Date   CHOL 232 (H) 06/30/2019   Lab Results  Component Value Date   HDL 47 06/30/2019   Lab Results  Component Value Date   LDLCALC 132 (H) 06/30/2019   Lab Results  Component Value Date   TRIG 297 (H) 06/30/2019   Lab Results  Component Value Date   CHOLHDL 4.9 (H) 06/30/2019   Lab Results  Component Value Date   HGBA1C 10.2 (A) 06/30/2019   Assessment & Plan:   1. Type 2 diabetes mellitus with complication, with long-term current use of insulin ([place]) She will continue medication as prescribed, to decrease foods/beverages high in sugars and carbs and follow Heart Healthy or DASH diet. Increase physical activity to at least 30 minutes cardio exercise daily. s - POCT urinalysis dipstick - POCT glucose (manual entry)  2. Hemoglobin A1C greater than 9%, indicating poor diabetic control Hemoglobin A1c increased at 10.2 today, from 9.3 on 02/20/2019. Monitor.   3. Hyperglycemia  4. Essential hypertension She will continue to take medications as prescribed, to decrease high sodium intake, excessive alcohol intake, increase potassium intake, smoking cessation, and increase physical activity of at least 30 minutes of cardio activity daily. She will continue to follow Heart Healthy or DASH diet.  5. Bilateral lower extremity edema - Ambulatory referral to Vascular Surgery - furosemide (LASIX) 20 MG tablet; Take 1 tablet (20 mg total) by mouth  daily.  Dispense:  30 tablet; Refill: 3  6. Bilateral lower extremity pain Recently US for DVT resulted negative. We will refer her to Vascular Surgery for further assessment.  - Ambulatory referral to Vascular Surgery - furosemide (LASIX) 20 MG tablet; Take 1 tablet (20 mg total) by mouth daily.  Dispense: 30 tablet; Refill: 3  7. Anxiety Stable today.   8. Acute cystitis without hematuria We will initiate antibiotic today.  - nitrofurantoin, macrocrystal-monohydrate, (MACROBID) 100 MG capsule; Take 1 capsule (100 mg total) by mouth 2 (two) times daily for 7 days.  Dispense: 14 capsule; Refill: 0  9. Abnormal urinalysis Results are pending.  - Urine Culture  10. Follow up She will follow up in 3 months.  Meds ordered this encounter  Medications  . furosemide (LASIX) 20 MG tablet    Sig: Take 1 tablet (20 mg total) by mouth daily.    Dispense:  30 tablet    Refill:  3  . nitrofurantoin, macrocrystal-monohydrate, (MACROBID) 100 MG capsule    Sig: Take 1 capsule (100 mg total) by mouth 2 (two) times daily for 7 days.    Dispense:  14 capsule    Refill:  0   Orders Placed This Encounter  Procedures  . Urine Culture  . Ambulatory referral to Vascular Surgery  . POCT urinalysis dipstick  . POCT glucose (manual entry)     Referral Orders     Ambulatory referral to Vascular Surgery    Kathe Becton,  MSN, FNP-BC [place], [place] [address] [phone] fax  Problem List Items Addressed This Visit      Cardiovascular and Mediastinum   Essential hypertension   Relevant Medications   furosemide (LASIX) 20 MG tablet     Endocrine   Hemoglobin A1C greater than 9%, indicating poor diabetic control   Type 2 diabetes mellitus with complication, with long-term current use of insulin ([facility]) - Primary   Relevant Orders   POCT urinalysis dipstick (Completed)   POCT  glucose (manual entry) (Completed)     Other   Anxiety   Hyperglycemia    Other Visit Diagnoses    Bilateral lower extremity edema       Relevant Medications   furosemide (LASIX) 20 MG tablet   Other Relevant Orders   Ambulatory referral to Vascular Surgery   Acute cystitis without hematuria       Relevant Medications   nitrofurantoin, macrocrystal-monohydrate, (MACROBID) 100 MG capsule   Abnormal urinalysis       Relevant Orders   Urine Culture (Completed)   Follow up          Meds ordered this encounter  Medications  . furosemide (LASIX) 20 MG tablet    Sig: Take 1 tablet (20 mg total) by mouth daily.    Dispense:  30 tablet    Refill:  3  . nitrofurantoin, macrocrystal-monohydrate, (MACROBID) 100 MG capsule    Sig: Take 1 capsule (100 mg total) by mouth 2 (two) times daily for 7 days.    Dispense:  14 capsule    Refill:  0    Follow-up: Return in about 3 months (around 11/16/2019).    Azzie Glatter, FNP

## 2019-08-16 NOTE — Patient Instructions (Addendum)
Edema  Edema is when you have too much fluid in your body or under your skin. Edema may make your legs, feet, and ankles swell up. Swelling is also common in looser tissues, like around your eyes. This is a common condition. It gets more common as you get older. There are many possible causes of edema. Eating too much salt (sodium) and being on your feet or sitting for a long time can cause edema in your legs, feet, and ankles. Hot weather may make edema worse. Edema is usually painless. Your skin may look swollen or shiny. Follow these instructions at home:  Keep the swollen body part raised (elevated) above the level of your heart when you are sitting or lying down.  Do not sit still or stand for a long time.  Do not wear tight clothes. Do not wear garters on your upper legs.  Exercise your legs. This can help the swelling go down.  Wear elastic bandages or support stockings as told by your doctor.  Eat a low-salt (low-sodium) diet to reduce fluid as told by your doctor.  Depending on the cause of your swelling, you may need to limit how much fluid you drink (fluid restriction).  Take over-the-counter and prescription medicines only as told by your doctor. Contact a doctor if:  Treatment is not working.  You have heart, liver, or kidney disease and have symptoms of edema.  You have sudden and unexplained weight gain. Get help right away if:  You have shortness of breath or chest pain.  You cannot breathe when you lie down.  You have pain, redness, or warmth in the swollen areas.  You have heart, liver, or kidney disease and get edema all of a sudden.  You have a fever and your symptoms get worse all of a sudden. Summary  Edema is when you have too much fluid in your body or under your skin.  Edema may make your legs, feet, and ankles swell up. Swelling is also common in looser tissues, like around your eyes.  Raise (elevate) the swollen body part above the level of your  heart when you are sitting or lying down.  Follow your doctor's instructions about diet and how much fluid you can drink (fluid restriction). This information is not intended to replace advice given to you by your health care provider. Make sure you discuss any questions you have with your health care provider. Document Revised: 04/02/2017 Document Reviewed: 04/17/2016 Elsevier Patient Education  [place]. Furosemide Oral Tablets What is this medicine? FUROSEMIDE (fyoor [place] se mide) is a diuretic. It helps you make more urine and to lose salt and excess water from your body. It treats swelling from heart, kidney, or liver disease. It also treats high blood pressure. This medicine may be used for other purposes; ask your health care provider or pharmacist if you have questions. COMMON BRAND NAME(S): Active-Medicated Specimen Kit, Delone, Diuscreen, Lasix, RX Specimen Collection Kit, Specimen Collection Kit, URINX Medicated Specimen Collection What should I tell my health care provider before I take this medicine? They need to know if you have any of these conditions:  abnormal blood electrolytes  diarrhea or vomiting  gout  heart disease  kidney disease, small amounts of urine, or difficulty passing urine  liver disease  thyroid disease  an unusual or allergic reaction to furosemide, sulfa drugs, other medicines, foods, dyes, or preservatives  pregnant or trying to get pregnant  breast-feeding How should I use this medicine?  Take this drug by mouth. Take it as directed on the prescription label at the same time every day. You can take it with or without food. If it upsets your stomach, take it with food. Keep taking it unless your health care provider tells you to stop. Talk to your health care provider about the use of this drug in children. Special care may be needed. Overdosage: If you think you have taken too much of this medicine contact a poison control center or  emergency room at once. NOTE: This medicine is only for you. Do not share this medicine with others. What if I miss a dose? If you miss a dose, take it as soon as you can. If it is almost time for your next dose, take only that dose. Do not take double or extra doses. What may interact with this medicine?  aspirin and aspirin-like medicines  certain antibiotics  chloral hydrate  cisplatin  cyclosporine  digoxin  diuretics  laxatives  lithium  medicines for blood pressure  medicines that relax muscles for surgery  methotrexate  NSAIDs, medicines for pain and inflammation like ibuprofen, naproxen, or indomethacin  phenytoin  steroid medicines like prednisone or cortisone  sucralfate  thyroid hormones This list may not describe all possible interactions. Give your health care provider a list of all the medicines, herbs, non-prescription drugs, or dietary supplements you use. Also tell them if you smoke, drink alcohol, or use illegal drugs. Some items may interact with your medicine. What should I watch for while using this medicine? Visit your doctor or health care provider for regular checks on your progress. Check your blood pressure regularly. Ask your doctor or health care provider what your blood pressure should be, and when you should contact him or her. If you are a diabetic, check your blood sugar as directed. This medicine may cause serious skin reactions. They can happen weeks to months after starting the medicine. Contact your health care provider right away if you notice fevers or flu-like symptoms with a rash. The rash may be red or purple and then turn into blisters or peeling of the skin. Or, you might notice a red rash with swelling of the face, lips or lymph nodes in your neck or under your arms. You may need to be on a special diet while taking this medicine. Check with your doctor. Also, ask how many glasses of fluid you need to drink a day. You must not get  dehydrated. You may get drowsy or dizzy. Do not drive, use machinery, or do anything that needs mental alertness until you know how this drug affects you. Do not stand or sit up quickly, especially if you are an older patient. This reduces the risk of dizzy or fainting spells. Alcohol can make you more drowsy and dizzy. Avoid alcoholic drinks. This medicine can make you more sensitive to the sun. Keep out of the sun. If you cannot avoid being in the sun, wear protective clothing and use sunscreen. Do not use sun lamps or tanning beds/booths. What side effects may I notice from receiving this medicine? Side effects that you should report to your doctor or health care professional as soon as possible:  blood in urine or stools  dry mouth  fever or chills  hearing loss or ringing in the ears  irregular heartbeat  muscle pain or weakness, cramps  rash, fever, and swollen lymph nodes  redness, blistering, peeling or loosening of the skin, including inside the mouth  skin rash  stomach upset, pain, or nausea  tingling or numbness in the hands or feet  unusually weak or tired  vomiting or diarrhea  yellowing of the eyes or skin Side effects that usually do not require medical attention (report to your doctor or health care professional if they continue or are bothersome):  headache  loss of appetite  unusual bleeding or bruising This list may not describe all possible side effects. Call your doctor for medical advice about side effects. You may report side effects to FDA at 1-800-FDA-1088. Where should I keep my medicine? Keep out of the reach of children and pets. Store at room temperature between 20 and 25 degrees C (68 and 77 degrees F). Protect from light and moisture. Keep the container tightly closed. Throw away any unused drug after the expiration date. NOTE: This sheet is a summary. It may not cover all possible information. If you have questions about this medicine, talk  to your doctor, pharmacist, or health care provider.  2020 Elsevier/Gold Standard (2018-11-15 18:01:32)  Urinary Tract Infection, Adult  A urinary tract infection (UTI) is an infection of any part of the urinary tract. The urinary tract includes the kidneys, ureters, bladder, and urethra. These organs make, store, and get rid of urine in the body. Your health care provider may use other names to describe the infection. An upper UTI affects the ureters and kidneys (pyelonephritis). A lower UTI affects the bladder (cystitis) and urethra (urethritis). What are the causes? Most urinary tract infections are caused by bacteria in your genital area, around the entrance to your urinary tract (urethra). These bacteria grow and cause inflammation of your urinary tract. What increases the risk? You are more likely to develop this condition if:  You have a urinary catheter that stays in place (indwelling).  You are not able to control when you urinate or have a bowel movement (you have incontinence).  You are female and you: ? Use a spermicide or diaphragm for birth control. ? Have low estrogen levels. ? Are pregnant.  You have certain genes that increase your risk (genetics).  You are sexually active.  You take antibiotic medicines.  You have a condition that causes your flow of urine to slow down, such as: ? An enlarged prostate, if you are female. ? Blockage in your urethra (stricture). ? A kidney stone. ? A nerve condition that affects your bladder control (neurogenic bladder). ? Not getting enough to drink, or not urinating often.  You have certain medical conditions, such as: ? Diabetes. ? A weak disease-fighting system (immunesystem). ? Sickle cell disease. ? Gout. ? Spinal cord injury. What are the signs or symptoms? Symptoms of this condition include:  Needing to urinate right away (urgently).  Frequent urination or passing small amounts of urine frequently.  Pain or  burning with urination.  Blood in the urine.  Urine that smells bad or unusual.  Trouble urinating.  Cloudy urine.  Vaginal discharge, if you are female.  Pain in the abdomen or the lower back. You may also have:  Vomiting or a decreased appetite.  Confusion.  Irritability or tiredness.  A fever.  Diarrhea. The first symptom in older adults may be confusion. In some cases, they may not have any symptoms until the infection has worsened. How is this diagnosed? This condition is diagnosed based on your medical history and a physical exam. You may also have other tests, including:  Urine tests.  Blood tests.  Tests for sexually  transmitted infections (STIs). If you have had more than one UTI, a cystoscopy or imaging studies may be done to determine the cause of the infections. How is this treated? Treatment for this condition includes:  Antibiotic medicine.  Over-the-counter medicines to treat discomfort.  Drinking enough water to stay hydrated. If you have frequent infections or have other conditions such as a kidney stone, you may need to see a health care provider who specializes in the urinary tract (urologist). In rare cases, urinary tract infections can cause sepsis. Sepsis is a life-threatening condition that occurs when the body responds to an infection. Sepsis is treated in the hospital with IV antibiotics, fluids, and other medicines. Follow these instructions at home:  Medicines  Take over-the-counter and prescription medicines only as told by your health care provider.  If you were prescribed an antibiotic medicine, take it as told by your health care provider. Do not stop using the antibiotic even if you start to feel better. General instructions  Make sure you: ? Empty your bladder often and completely. Do not hold urine for long periods of time. ? Empty your bladder after sex. ? Wipe from front to back after a bowel movement if you are female. Use  each tissue one time when you wipe.  Drink enough fluid to keep your urine pale yellow.  Keep all follow-up visits as told by your health care provider. This is important. Contact a health care provider if:  Your symptoms do not get better after 1-2 days.  Your symptoms go away and then return. Get help right away if you have:  Severe pain in your back or your lower abdomen.  A fever.  Nausea or vomiting. Summary  A urinary tract infection (UTI) is an infection of any part of the urinary tract, which includes the kidneys, ureters, bladder, and urethra.  Most urinary tract infections are caused by bacteria in your genital area, around the entrance to your urinary tract (urethra).  Treatment for this condition often includes antibiotic medicines.  If you were prescribed an antibiotic medicine, take it as told by your health care provider. Do not stop using the antibiotic even if you start to feel better.  Keep all follow-up visits as told by your health care provider. This is important. This information is not intended to replace advice given to you by your health care provider. Make sure you discuss any questions you have with your health care provider. Document Revised: 03/17/2018 Document Reviewed: 10/07/2017 Elsevier Patient Education  [place]. Nitrofurantoin tablets or capsules What is this medicine? NITROFURANTOIN (nye troe fyoor AN toyn) is an antibiotic. It is used to treat urinary tract infections. This medicine may be used for other purposes; ask your health care provider or pharmacist if you have questions. COMMON BRAND NAME(S): Macrobid, Macrodantin, Urotoin What should I tell my health care provider before I take this medicine? They need to know if you have any of these conditions:  anemia  diabetes  glucose-6-phosphate dehydrogenase deficiency  kidney disease  liver disease  lung disease  other chronic illness  an unusual or allergic  reaction to nitrofurantoin, other antibiotics, other medicines, foods, dyes or preservatives  pregnant or trying to get pregnant  breast-feeding How should I use this medicine? Take this medicine by mouth with a glass of water. Follow the directions on the prescription label. Take this medicine with food or milk. Take your doses at regular intervals. Do not take your medicine more often than directed.  Do not stop taking except on your doctor's advice. Talk to your pediatrician regarding the use of this medicine in children. While this drug may be prescribed for selected conditions, precautions do apply. Overdosage: If you think you have taken too much of this medicine contact a poison control center or emergency room at once. NOTE: This medicine is only for you. Do not share this medicine with others. What if I miss a dose? If you miss a dose, take it as soon as you can. If it is almost time for your next dose, take only that dose. Do not take double or extra doses. What may interact with this medicine?  antacids containing magnesium trisilicate  probenecid  quinolone antibiotics like ciprofloxacin, lomefloxacin, norfloxacin and ofloxacin  sulfinpyrazone This list may not describe all possible interactions. Give your health care provider a list of all the medicines, herbs, non-prescription drugs, or dietary supplements you use. Also tell them if you smoke, drink alcohol, or use illegal drugs. Some items may interact with your medicine. What should I watch for while using this medicine? Tell your doctor or health care professional if your symptoms do not improve or if you get new symptoms. Drink several glasses of water a day. If you are taking this medicine for a long time, visit your doctor for regular checks on your progress. If you are diabetic, you may get a false positive result for sugar in your urine with certain brands of urine tests. Check with your doctor. What side effects may I  notice from receiving this medicine? Side effects that you should report to your doctor or health care professional as soon as possible:  allergic reactions like skin rash or hives, swelling of the face, lips, or tongue  chest pain  cough  difficulty breathing  dizziness, drowsiness  fever or infection  joint aches or pains  pale or blue-tinted skin  redness, blistering, peeling or loosening of the skin, including inside the mouth  tingling, burning, pain, or numbness in hands or feet  unusual bleeding or bruising  unusually weak or tired  yellowing of eyes or skin Side effects that usually do not require medical attention (report to your doctor or health care professional if they continue or are bothersome):  dark urine  diarrhea  headache  loss of appetite  nausea or vomiting  temporary hair loss This list may not describe all possible side effects. Call your doctor for medical advice about side effects. You may report side effects to FDA at 1-800-FDA-1088. Where should I keep my medicine? Keep out of the reach of children. Store at room temperature between 15 and 30 degrees C (59 and 86 degrees F). Protect from light. Throw away any unused medicine after the expiration date. NOTE: This sheet is a summary. It may not cover all possible information. If you have questions about this medicine, talk to your doctor, pharmacist, or health care provider.  2020 Elsevier/Gold Standard (2007-10-19 15:56:47)

## 2019-08-17 MED FILL — LISINOPRIL 40 MG TABLET: 40 | 30 days supply | Qty: 30 | Fill #9

## 2019-08-18 LAB — URINE CULTURE

## 2019-08-20 ENCOUNTER — Encounter: Payer: Self-pay | Admitting: Family Medicine

## 2019-08-20 DIAGNOSIS — R600 Localized edema: Secondary | ICD-10-CM | POA: Insufficient documentation

## 2019-08-20 DIAGNOSIS — M79604 Pain in right leg: Secondary | ICD-10-CM | POA: Insufficient documentation

## 2019-08-20 DIAGNOSIS — M79605 Pain in left leg: Secondary | ICD-10-CM | POA: Insufficient documentation

## 2019-09-15 MED FILL — LISINOPRIL 40 MG TABLET: 40 | 30 days supply | Qty: 30 | Fill #10

## 2019-09-15 MED FILL — !LANTUS 100 UNITS/ML VIAL: 100 | 20 days supply | Qty: 10 | Fill #6

## 2019-09-25 MED FILL — METFORMIN HCL 500 MG TABS: 500 | 15 days supply | Qty: 60 | Fill #2

## 2019-10-13 MED FILL — TRUEPLUS SYR 1ML 31GX5/16: 31G X 5/16" | 90 days supply | Qty: 100 | Fill #1

## 2019-10-13 MED FILL — LISINOPRIL 40 MG TABLET: 40 | 30 days supply | Qty: 30 | Fill #11

## 2019-10-18 ENCOUNTER — Encounter (HOSPITAL_COMMUNITY): Payer: Medicaid Other

## 2019-10-18 ENCOUNTER — Encounter: Payer: Medicaid Other | Admitting: Vascular Surgery

## 2019-10-24 ENCOUNTER — Other Ambulatory Visit: Payer: Self-pay | Admitting: Family Medicine

## 2019-10-24 DIAGNOSIS — E118 Type 2 diabetes mellitus with unspecified complications: Secondary | ICD-10-CM

## 2019-10-24 MED FILL — METFORMIN HCL 500 MG TABS: 500 | 30 days supply | Qty: 120 | Fill #0

## 2019-11-06 ENCOUNTER — Other Ambulatory Visit: Payer: Self-pay | Admitting: Family Medicine

## 2019-11-06 DIAGNOSIS — I10 Essential (primary) hypertension: Secondary | ICD-10-CM

## 2019-11-06 MED FILL — LISINOPRIL 40 MG TABLET: 40 | 30 days supply | Qty: 30 | Fill #0

## 2019-11-06 MED FILL — $LANTUS 100 UNITS/ML VIAL: 100 | 80 days supply | Qty: 40 | Fill #7

## 2019-11-10 ENCOUNTER — Other Ambulatory Visit: Payer: Self-pay

## 2019-11-10 DIAGNOSIS — M79604 Pain in right leg: Secondary | ICD-10-CM

## 2019-11-17 ENCOUNTER — Ambulatory Visit (INDEPENDENT_AMBULATORY_CARE_PROVIDER_SITE_OTHER): Payer: Self-pay | Admitting: Family Medicine

## 2019-11-17 ENCOUNTER — Other Ambulatory Visit: Payer: Self-pay

## 2019-11-17 ENCOUNTER — Encounter: Payer: Self-pay | Admitting: Family Medicine

## 2019-11-17 VITALS — BP 153/90 | HR 93 | Temp 99.3°F | Resp 16 | Ht 62.0 in | Wt 194.0 lb

## 2019-11-17 DIAGNOSIS — I10 Essential (primary) hypertension: Secondary | ICD-10-CM

## 2019-11-17 DIAGNOSIS — F419 Anxiety disorder, unspecified: Secondary | ICD-10-CM

## 2019-11-17 DIAGNOSIS — R739 Hyperglycemia, unspecified: Secondary | ICD-10-CM

## 2019-11-17 DIAGNOSIS — E118 Type 2 diabetes mellitus with unspecified complications: Secondary | ICD-10-CM

## 2019-11-17 DIAGNOSIS — R8290 Unspecified abnormal findings in urine: Secondary | ICD-10-CM

## 2019-11-17 DIAGNOSIS — M79605 Pain in left leg: Secondary | ICD-10-CM

## 2019-11-17 DIAGNOSIS — Z09 Encounter for follow-up examination after completed treatment for conditions other than malignant neoplasm: Secondary | ICD-10-CM

## 2019-11-17 DIAGNOSIS — R7309 Other abnormal glucose: Secondary | ICD-10-CM

## 2019-11-17 DIAGNOSIS — M79604 Pain in right leg: Secondary | ICD-10-CM

## 2019-11-17 DIAGNOSIS — R600 Localized edema: Secondary | ICD-10-CM

## 2019-11-17 DIAGNOSIS — Z794 Long term (current) use of insulin: Secondary | ICD-10-CM

## 2019-11-17 LAB — POCT GLYCOSYLATED HEMOGLOBIN (HGB A1C): Hemoglobin A1C: 11.2 % — AB (ref 4.0–5.6)

## 2019-11-17 LAB — POCT URINALYSIS DIPSTICK
Bilirubin, UA: NEGATIVE
Glucose, UA: POSITIVE — AB
Ketones, UA: NEGATIVE
Nitrite, UA: NEGATIVE
Protein, UA: NEGATIVE
Spec Grav, UA: <=1.005 — AB (ref 1.010–1.025)
Urobilinogen, UA: 0.2 E.U./dL
pH, UA: 5.5 (ref 5.0–8.0)

## 2019-11-17 LAB — GLUCOSE, POCT (MANUAL RESULT ENTRY): POC Glucose: 315 mg/dl — AB (ref 70–99)

## 2019-11-17 NOTE — Progress Notes (Signed)
[facility]   Established Patient Office Visit  Subjective:  Patient ID: Wanda Weaver, female    DOB: [birth year]  Age: 59 y.o. MRN: [id number]  CC:  Chief Complaint  Patient presents with  . Hypertension  . Diabetes    HPI Wanda Weaver is a 59 year old female who presents for Follow Up today.    Patient Active Problem List   Diagnosis Date Noted  . Bilateral lower extremity pain 08/20/2019  . Bilateral lower extremity edema 08/20/2019  . Hemoglobin A1C greater than 9%, indicating poor diabetic control 02/21/2019  . Weight loss 02/21/2019  . Muscle strain of wrist, left, initial encounter 09/22/2018  . Hyperglycemia 08/15/2018  . Essential hypertension 04/25/2018  . Class 1 obesity due to excess calories with serious comorbidity in adult 04/25/2018  . Right foot pain 04/25/2018  . Anxiety 04/25/2018  . Type 2 diabetes mellitus with complication, with long-term current use of insulin (HCC) 08/06/2017   Current Status: Since her last office visit, she is doing well with no complaints. She has c/o bilateral lower extremity pain. Her current job requires her to stand for extensive amount of time daily. Previously negative for DVT. She has appointment with Vascular Surgeon 828/2021. She states that she is only taking Metformin 1,000 mg a day. She has c/o blurry vision. Her most recent normal range of preprandial blood glucose levels have been between 130-140. She has seen low range of 134 and high of 235 since his last office visit. She denies fatigue, frequent urination, excessive hunger, excessive thirst, weight gain, weight loss, and poor wound healing. She continues to check her feet regularly. Her blood glucose is elevated today, r/t her eating a meal prior to her appointment. She denies chest pain, cough, shortness of breath, heart palpitations, and falls. She has occasional headaches and dizziness with position changes. Denies severe  headaches, confusion, seizures, double vision, and blurred vision, nausea and vomiting. She denies fevers, chills, recent infections, weight loss, and night sweats.  Denies GI problems such as nausea, diarrhea, and constipation. She has no reports of blood in stools, dysuria and hematuria. No depression or anxiety reported today. She is taking all medications as prescribed.    Past Medical History:  Diagnosis Date  . Bilateral lower extremity edema 07/2019  . Bilateral lower extremity pain 07/2019  . Diabetes mellitus without complication (HCC)   . Hemoglobin A1C greater than 9%, indicating poor diabetic control   . Hyperlipidemia   . Hypertension   . Vitamin D deficiency     Past Surgical History:  Procedure Laterality Date  . APPENDECTOMY    . CARPAL TUNNEL RELEASE Bilateral   . CHOLECYSTECTOMY    . WRIST SURGERY Bilateral     Family History  Problem Relation Age of Onset  . Diabetes Mother   . Dementia Mother   . Diabetes Father   . Hypertension Father     Social History   Socioeconomic History  . Marital status: Single    Spouse name: Not on file  . Number of children: Not on file  . Years of education: Not on file  . Highest education level: Not on file  Occupational History  . Not on file  Tobacco Use  . Smoking status: Never Smoker  . Smokeless tobacco: Never Used  Vaping Use  . Vaping Use: Never used  Substance and Sexual Activity  . Alcohol use: No  . Drug use: No  . Sexual  activity: Yes    Partners: Male  Other Topics Concern  . Not on file  Social History Narrative  . Not on file   Social Determinants of Health   Financial Resource Strain:   . Difficulty of Paying Living Expenses:   Food Insecurity:   . Worried About Charity fundraiser in the Last Year:   . Arboriculturist in the Last Year:   Transportation Needs:   . Film/video editor (Medical):   Marland Kitchen Lack of Transportation (Non-Medical):   Physical Activity:   . Days of Exercise per  Week:   . Minutes of Exercise per Session:   Stress:   . Feeling of Stress :   Social Connections:   . Frequency of Communication with Friends and Family:   . Frequency of Social Gatherings with Friends and Family:   . Attends Religious Services:   . Active Member of Clubs or Organizations:   . Attends Archivist Meetings:   Marland Kitchen Marital Status:   Intimate Partner Violence:   . Fear of Current or Ex-Partner:   . Emotionally Abused:   Marland Kitchen Physically Abused:   . Sexually Abused:     Outpatient Medications Prior to Visit  Medication Sig Dispense Refill  . albuterol (PROVENTIL HFA;VENTOLIN HFA) 108 (90 Base) MCG/ACT inhaler Inhale 2 puffs into the lungs every 6 (six) hours as needed for wheezing or shortness of breath. 1 Inhaler 12  . ALPRAZolam (XANAX) 0.5 MG tablet Take 1 tablet (0.5 mg total) by mouth at bedtime as needed for anxiety. 30 tablet 0  . atorvastatin (LIPITOR) 10 MG tablet Take 1 tablet (10 mg total) by mouth daily. 30 tablet 6  . blood glucose meter kit and supplies KIT Dispense based on patient and insurance preference. 100 each 12  . busPIRone (BUSPAR) 10 MG tablet Take 1 tablet (10 mg total) by mouth 3 (three) times daily. 60 tablet 3  . furosemide (LASIX) 20 MG tablet Take 1 tablet (20 mg total) by mouth daily. 30 tablet 3  . glipiZIDE (GLUCOTROL) 10 MG tablet Take 1 tablet (10 mg total) by mouth 2 (two) times daily before a meal. 60 tablet 2  . glucose blood (ACCU-CHEK AVIVA) test strip Use as instructed 100 each 12  . glucose blood (TRUE METRIX BLOOD GLUCOSE TEST) test strip Use as instructed 100 each 12  . ibuprofen (ADVIL) 800 MG tablet Take 1 tablet (800 mg total) by mouth every 8 (eight) hours as needed. 30 tablet 3  . Insulin Glargine (BASAGLAR KWIKPEN) 100 UNIT/ML SOPN Inject 0.5 mLs (50 Units total) into the skin daily. 50 mL 6  . Insulin Pen Needle (BD ULTRA-FINE PEN NEEDLES) 29G X 12.7MM MISC ICD10 E11.9 for use with insulin administration 200 each 2  .  Insulin Syringe-Needle U-100 (BD INSULIN SYRINGE ULTRAFINE) 31G X 5/16" 0.5 ML MISC 0.5 Units by Does not apply route daily. 10 each 11  . Lancets 30G MISC 1 each by Does not apply route as directed. 100 each 12  . lisinopril (ZESTRIL) 40 MG tablet TAKE 1 TABLET BY MOUTH DAILY 30 tablet 3  . metFORMIN (GLUCOPHAGE) 500 MG tablet TAKE 2 TABLETS (1,000 MG TOTAL) BY MOUTH 2 (TWO) TIMES DAILY WITH A MEAL. 60 tablet 2  . Multiple Vitamin (MULTIVITAMIN) tablet Take 1 tablet by mouth daily.    . Vitamin D, Ergocalciferol, (DRISDOL) 1.25 MG (50000 UNIT) CAPS capsule Take 1 capsule (50,000 Units total) by mouth every 7 (seven) days. 5  capsule 6   No facility-administered medications prior to visit.    No Known Allergies  ROS Review of Systems  Constitutional: Negative.   HENT: Negative.   Eyes: Negative.   Respiratory: Negative.   Cardiovascular: Negative.   Gastrointestinal: Positive for abdominal distention.  Endocrine: Negative.   Genitourinary: Negative.   Musculoskeletal: Positive for arthralgias (generalized joint pain).  Skin: Negative.   Allergic/Immunologic: Negative.   Neurological: Positive for dizziness (occasional) and headaches (occasional ).  Hematological: Negative.   Psychiatric/Behavioral: Negative.       Objective:    Physical Exam Vitals and nursing note reviewed.  Constitutional:      Appearance: Normal appearance.  HENT:     Head: Normocephalic and atraumatic.     Nose: Nose normal.     Mouth/Throat:     Mouth: Mucous membranes are moist.     Pharynx: Oropharynx is clear.  Cardiovascular:     Rate and Rhythm: Normal rate and regular rhythm.     Pulses: Normal pulses.     Heart sounds: Normal heart sounds.  Pulmonary:     Effort: Pulmonary effort is normal.     Breath sounds: Normal breath sounds.  Abdominal:     General: Bowel sounds are normal. There is distension.  Musculoskeletal:        General: Normal range of motion.     Cervical back: Normal  range of motion and neck supple.  Skin:    General: Skin is warm and dry.  Neurological:     General: No focal deficit present.     Mental Status: She is alert.  Psychiatric:        Mood and Affect: Mood normal.        Behavior: Behavior normal.        Thought Content: Thought content normal.     BP (!) 153/90 (BP Location: Left Arm, Patient Position: Sitting, Cuff Size: Normal)   Pulse 93   Temp 99.3 F (37.4 C) (Oral)   Resp 16   Ht '5\' 2"'$  (1.575 m)   Wt 194 lb (88 kg)   SpO2 100%   BMI 35.48 kg/m  Wt Readings from Last 3 Encounters:  11/17/19 194 lb (88 kg)  08/16/19 194 lb 3.2 oz (88.1 kg)  07/25/19 190 lb 6.4 oz (86.4 kg)     Health Maintenance Due  Topic Date Due  . COVID-19 Vaccine (1) Never done  . TETANUS/TDAP  Never done  . MAMMOGRAM  Never done  . COLONOSCOPY  Never done  . OPHTHALMOLOGY EXAM  08/20/2018  . FOOT EXAM  02/22/2019  . INFLUENZA VACCINE  11/12/2019    There are no preventive care reminders to display for this patient.  Lab Results  Component Value Date   TSH 1.040 06/30/2019   Lab Results  Component Value Date   WBC 6.2 06/30/2019   HGB 12.6 06/30/2019   HCT 38.0 06/30/2019   MCV 85 06/30/2019   PLT 220 06/30/2019   Lab Results  Component Value Date   NA 137 06/30/2019   K 4.6 06/30/2019   CO2 22 06/30/2019   GLUCOSE 251 (H) 06/30/2019   BUN 14 06/30/2019   CREATININE 0.78 06/30/2019   BILITOT 0.3 06/30/2019   ALKPHOS 85 06/30/2019   AST 20 06/30/2019   ALT 21 06/30/2019   PROT 6.2 06/30/2019   ALBUMIN 4.0 06/30/2019   CALCIUM 9.3 06/30/2019   Lab Results  Component Value Date   CHOL 232 (H) 06/30/2019  Lab Results  Component Value Date   HDL 47 06/30/2019   Lab Results  Component Value Date   LDLCALC 132 (H) 06/30/2019   Lab Results  Component Value Date   TRIG 297 (H) 06/30/2019   Lab Results  Component Value Date   CHOLHDL 4.9 (H) 06/30/2019   Lab Results  Component Value Date   HGBA1C 11.2 (A)  11/17/2019      Assessment & Plan:   1. Type 2 diabetes mellitus with complication, with long-term current use of insulin ([place]) She will continue medication as prescribed, to decrease foods/beverages high in sugars and carbs and follow Heart Healthy or DASH diet. Increase physical activity to at least 30 minutes cardio exercise daily.  - HgB A1c - Glucose (CBG)  2. Hemoglobin A1C greater than 9%, indicating poor diabetic control Hgb A1c is elevated at 11.2 today. Monitor.   3. Hyperglycemia  4. Essential hypertension She will continue to take medications as prescribed, to decrease high sodium intake, excessive alcohol intake, increase potassium intake, smoking cessation, and increase physical activity of at least 30 minutes of cardio activity daily. She will continue to follow Heart Healthy or DASH diet. - Urinalysis Dipstick  5. Bilateral lower extremity edema  6. Bilateral lower extremity pain  7. Anxiety  8. Abnormal urinalysis Results are pending.  - Urine Culture  9. Follow up She will follow up in 3 months.   No orders of the defined types were placed in this encounter.  Orders Placed This Encounter  Procedures  . Urine Culture  . Urinalysis Dipstick  . HgB A1c  . Glucose (CBG)    Referral Orders  No referral(s) requested today    Kathe Becton,  MSN, FNP-BC [address], [place] [address] [phone] fax  Problem List Items Addressed This Visit      Cardiovascular and Mediastinum   Essential hypertension   Relevant Orders   Urinalysis Dipstick (Completed)     Endocrine   Hemoglobin A1C greater than 9%, indicating poor diabetic control   Type 2 diabetes mellitus with complication, with long-term current use of insulin (HCC) - Primary   Relevant Orders   HgB A1c (Completed)   Glucose (CBG) (Completed)     Other   Anxiety    Bilateral lower extremity edema   Bilateral lower extremity pain   Hyperglycemia    Other Visit Diagnoses    Abnormal urinalysis       Relevant Orders   Urine Culture (Completed)   Follow up          No orders of the defined types were placed in this encounter.   Follow-up: Return in about 3 months (around 02/17/2020).    Azzie Glatter, FNP

## 2019-11-19 LAB — URINE CULTURE

## 2019-11-22 ENCOUNTER — Encounter: Payer: Self-pay | Admitting: Family Medicine

## 2019-11-23 ENCOUNTER — Encounter: Payer: Medicaid Other | Admitting: Vascular Surgery

## 2019-11-23 ENCOUNTER — Encounter (HOSPITAL_COMMUNITY): Payer: Medicaid Other

## 2019-12-06 MED FILL — LISINOPRIL 40 MG TABLET: 40 | 30 days supply | Qty: 30 | Fill #1

## 2019-12-14 MED FILL — ?METFORMIN HCL 500MG TABL: 500 | 15 days supply | Qty: 60 | Fill #1

## 2020-01-05 MED FILL — TRUEPLUS SYR 1ML 31GX5/16: 31G X 5/16" | 90 days supply | Qty: 100 | Fill #2

## 2020-01-09 MED FILL — LISINOPRIL 40 MG TABLET: 40 | 30 days supply | Qty: 30 | Fill #2

## 2020-01-19 ENCOUNTER — Other Ambulatory Visit: Payer: Self-pay | Admitting: Family Medicine

## 2020-01-19 DIAGNOSIS — E118 Type 2 diabetes mellitus with unspecified complications: Secondary | ICD-10-CM

## 2020-01-19 DIAGNOSIS — Z794 Long term (current) use of insulin: Secondary | ICD-10-CM

## 2020-01-24 ENCOUNTER — Telehealth: Payer: Self-pay | Admitting: Family Medicine

## 2020-01-24 ENCOUNTER — Other Ambulatory Visit: Payer: Self-pay | Admitting: Family Medicine

## 2020-01-24 DIAGNOSIS — Z794 Long term (current) use of insulin: Secondary | ICD-10-CM

## 2020-01-24 DIAGNOSIS — E118 Type 2 diabetes mellitus with unspecified complications: Secondary | ICD-10-CM

## 2020-01-24 MED ORDER — METFORMIN HCL 500 MG PO TABS: 1000.0000 mg | ORAL_TABLET | Freq: Two times a day (BID) | ORAL | 11 refills | Status: DC

## 2020-01-24 MED FILL — METFORMIN HCL 500 MG TABS: 500 | 30 days supply | Qty: 120 | Fill #0

## 2020-01-25 ENCOUNTER — Telehealth: Payer: Self-pay | Admitting: Family Medicine

## 2020-01-25 NOTE — Telephone Encounter (Signed)
Sent to provider 

## 2020-01-25 NOTE — Telephone Encounter (Signed)
error 

## 2020-02-03 IMAGING — CR DG CHEST 2V
2 series · 2 of 2 positions shown · non-contrast
Comparison: None.

CLINICAL DATA: Cough 3 days.

EXAM:
CHEST - 2 VIEW

[w chest pa]
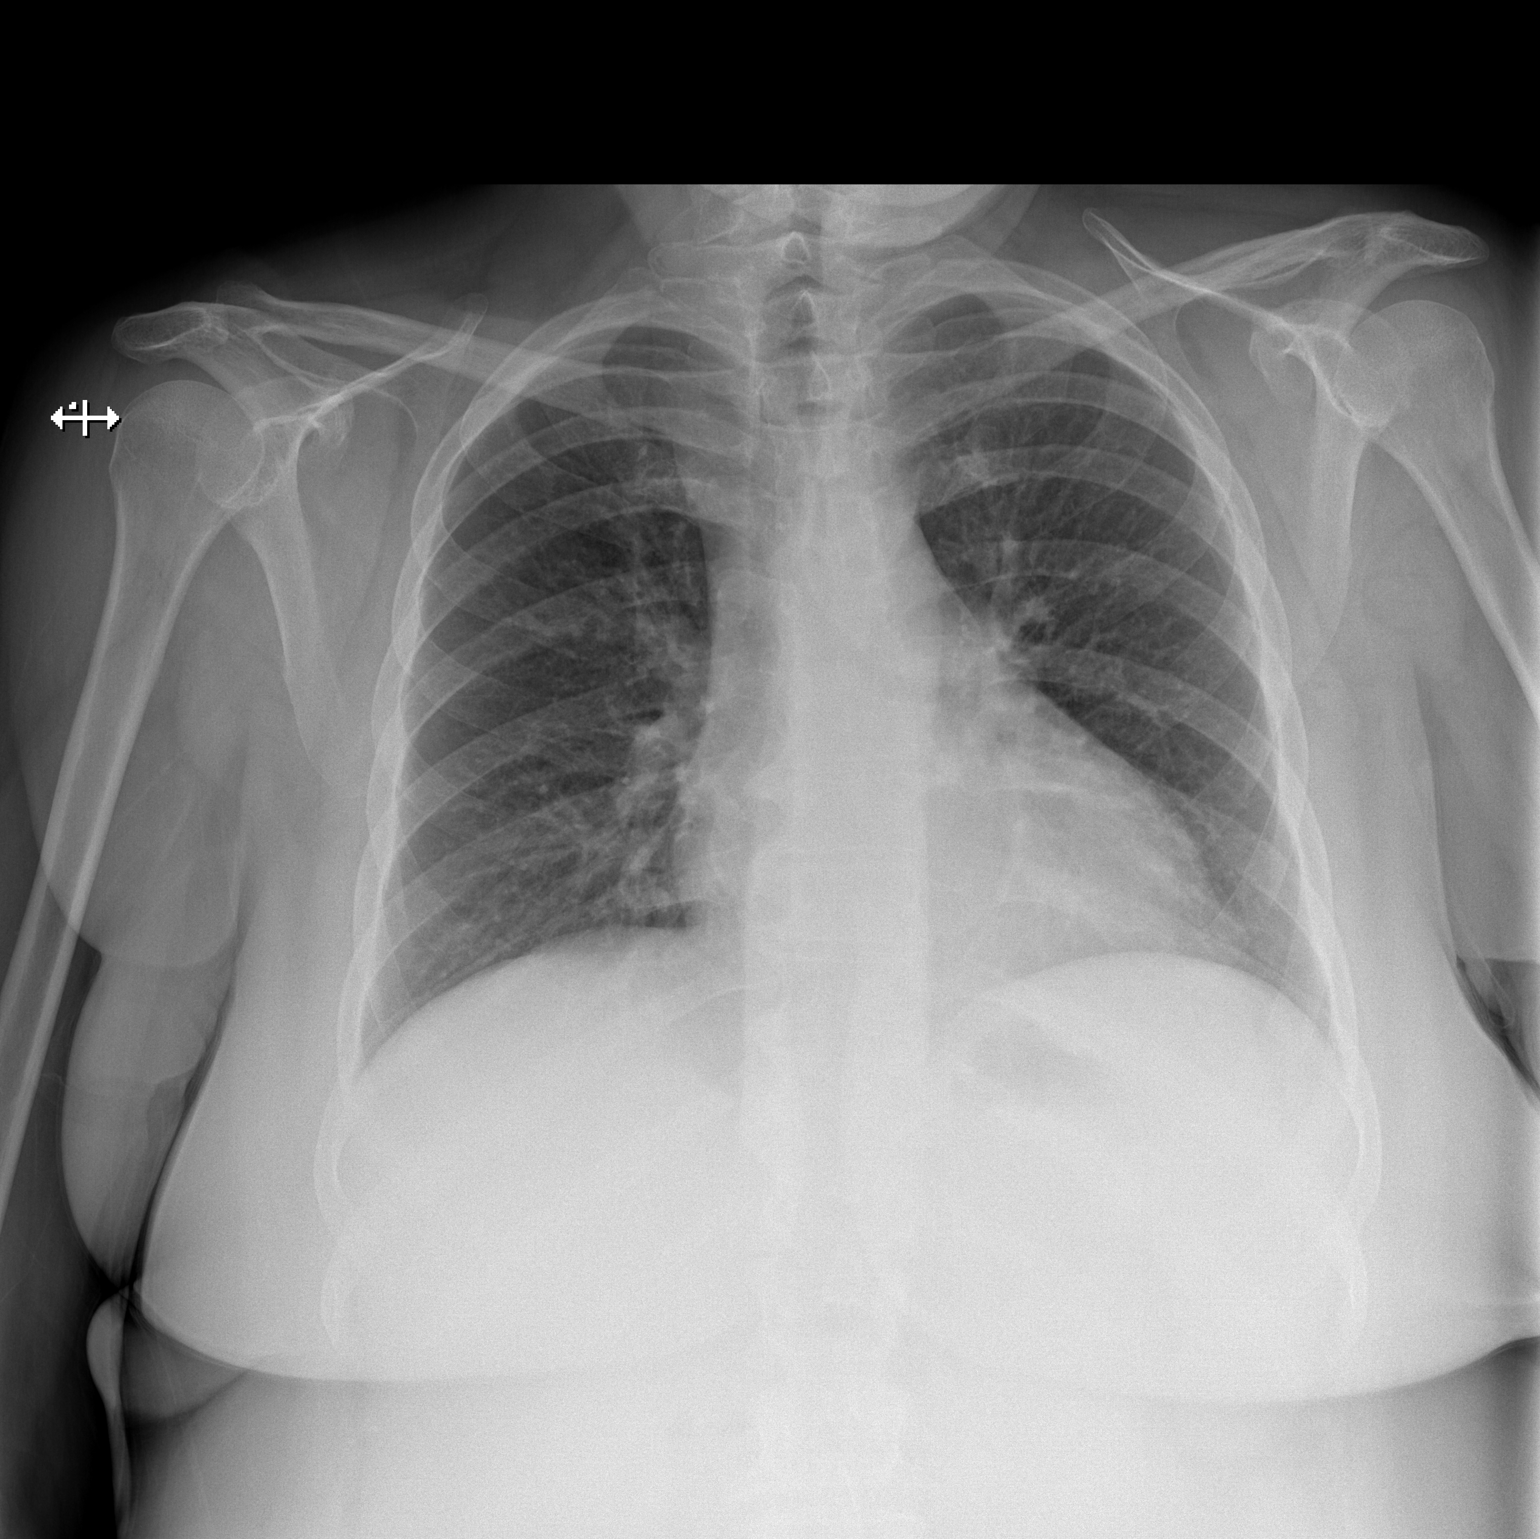

[w chest lat]
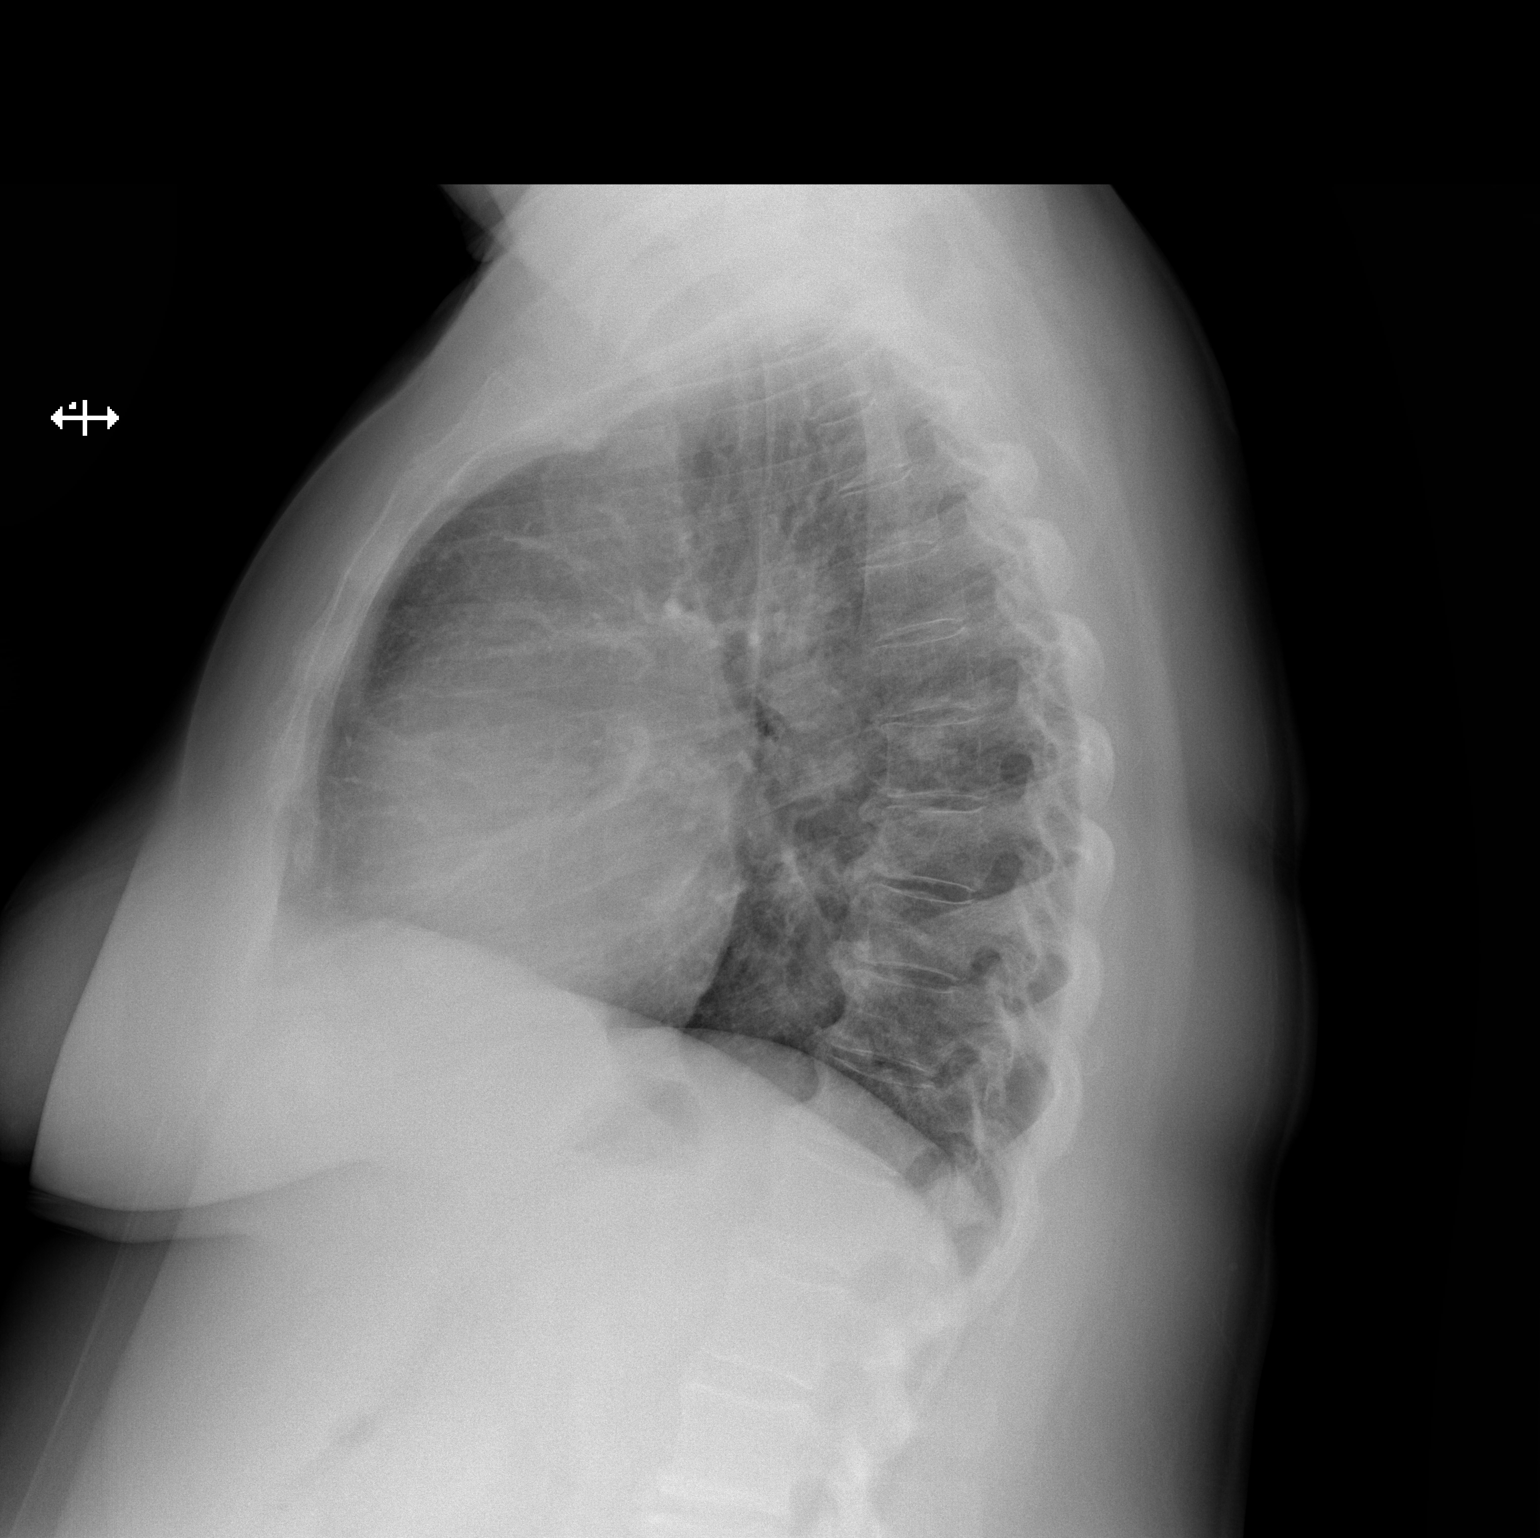

[2 of 2 positions shown; findings below may reference images not displayed]

FINDINGS: Lungs are adequately inflated without focal airspace consolidation
or effusion. Borderline cardiomegaly. Mild degenerative change of
the spine.
IMPRESSION: No active cardiopulmonary disease.

## 2020-02-08 MED FILL — LISINOPRIL 40 MG TABLET: 40 | 30 days supply | Qty: 30 | Fill #3

## 2020-02-16 ENCOUNTER — Ambulatory Visit: Payer: Self-pay | Admitting: Family Medicine

## 2020-02-21 ENCOUNTER — Other Ambulatory Visit: Payer: Self-pay | Admitting: Family Medicine

## 2020-02-21 DIAGNOSIS — E118 Type 2 diabetes mellitus with unspecified complications: Secondary | ICD-10-CM

## 2020-02-21 DIAGNOSIS — Z794 Long term (current) use of insulin: Secondary | ICD-10-CM

## 2020-02-28 ENCOUNTER — Other Ambulatory Visit: Payer: Self-pay | Admitting: Family Medicine

## 2020-02-28 ENCOUNTER — Telehealth: Payer: Self-pay | Admitting: Family Medicine

## 2020-02-28 DIAGNOSIS — E118 Type 2 diabetes mellitus with unspecified complications: Secondary | ICD-10-CM

## 2020-02-28 DIAGNOSIS — Z794 Long term (current) use of insulin: Secondary | ICD-10-CM

## 2020-02-28 MED ORDER — BASAGLAR KWIKPEN 100 UNIT/ML ~~LOC~~ SOPN: 50.0000 [IU] | PEN_INJECTOR | Freq: Every day | SUBCUTANEOUS | 0 refills | Status: DC

## 2020-02-28 MED FILL — !LANTUS 100 UNITS/ML VIAL: 100 | 20 days supply | Qty: 10 | Fill #0

## 2020-02-28 NOTE — Telephone Encounter (Signed)
Followed up by CMA

## 2020-03-01 ENCOUNTER — Other Ambulatory Visit: Payer: Self-pay | Admitting: Family Medicine

## 2020-03-01 DIAGNOSIS — E118 Type 2 diabetes mellitus with unspecified complications: Secondary | ICD-10-CM

## 2020-03-01 DIAGNOSIS — R7309 Other abnormal glucose: Secondary | ICD-10-CM

## 2020-03-01 DIAGNOSIS — R739 Hyperglycemia, unspecified: Secondary | ICD-10-CM

## 2020-03-01 DIAGNOSIS — Z794 Long term (current) use of insulin: Secondary | ICD-10-CM

## 2020-03-01 MED ORDER — INSULIN DETEMIR 100 UNIT/ML ~~LOC~~ SOLN: 50.0000 [IU] | Freq: Every day | SUBCUTANEOUS | 3 refills | Status: DC

## 2020-03-13 ENCOUNTER — Other Ambulatory Visit: Payer: Self-pay | Admitting: Family Medicine

## 2020-03-13 DIAGNOSIS — I10 Essential (primary) hypertension: Secondary | ICD-10-CM

## 2020-03-15 MED FILL — !LANTUS 100 UNITS/ML VIAL: 100 | 20 days supply | Qty: 10 | Fill #1

## 2020-03-19 ENCOUNTER — Other Ambulatory Visit: Payer: Self-pay | Admitting: Family Medicine

## 2020-03-19 ENCOUNTER — Encounter: Payer: Self-pay | Admitting: Family Medicine

## 2020-03-19 ENCOUNTER — Other Ambulatory Visit: Payer: Self-pay

## 2020-03-19 ENCOUNTER — Ambulatory Visit (INDEPENDENT_AMBULATORY_CARE_PROVIDER_SITE_OTHER): Payer: Self-pay | Admitting: Family Medicine

## 2020-03-19 VITALS — BP 186/96 | HR 77 | Temp 98.1°F | Resp 20 | Ht 62.0 in | Wt 195.6 lb

## 2020-03-19 DIAGNOSIS — Z760 Encounter for issue of repeat prescription: Secondary | ICD-10-CM

## 2020-03-19 DIAGNOSIS — Z794 Long term (current) use of insulin: Secondary | ICD-10-CM

## 2020-03-19 DIAGNOSIS — R7303 Prediabetes: Secondary | ICD-10-CM

## 2020-03-19 DIAGNOSIS — E118 Type 2 diabetes mellitus with unspecified complications: Secondary | ICD-10-CM

## 2020-03-19 DIAGNOSIS — I10 Essential (primary) hypertension: Secondary | ICD-10-CM

## 2020-03-19 DIAGNOSIS — E785 Hyperlipidemia, unspecified: Secondary | ICD-10-CM

## 2020-03-19 DIAGNOSIS — Z09 Encounter for follow-up examination after completed treatment for conditions other than malignant neoplasm: Secondary | ICD-10-CM

## 2020-03-19 DIAGNOSIS — R739 Hyperglycemia, unspecified: Secondary | ICD-10-CM

## 2020-03-19 DIAGNOSIS — E119 Type 2 diabetes mellitus without complications: Secondary | ICD-10-CM

## 2020-03-19 DIAGNOSIS — F419 Anxiety disorder, unspecified: Secondary | ICD-10-CM

## 2020-03-19 DIAGNOSIS — S66912A Strain of unspecified muscle, fascia and tendon at wrist and hand level, left hand, initial encounter: Secondary | ICD-10-CM

## 2020-03-19 DIAGNOSIS — R600 Localized edema: Secondary | ICD-10-CM

## 2020-03-19 DIAGNOSIS — R0602 Shortness of breath: Secondary | ICD-10-CM

## 2020-03-19 LAB — POCT GLYCOSYLATED HEMOGLOBIN (HGB A1C): Hemoglobin A1C: 8.6 % — AB (ref 4.0–5.6)

## 2020-03-19 LAB — POCT URINALYSIS DIP (CLINITEK)
Bilirubin, UA: NEGATIVE
Glucose, UA: NEGATIVE mg/dL
Ketones, POC UA: NEGATIVE mg/dL
Nitrite, UA: NEGATIVE
POC PROTEIN,UA: NEGATIVE
Spec Grav, UA: 1.015 (ref 1.010–1.025)
Urobilinogen, UA: 0.2 E.U./dL
pH, UA: 5 (ref 5.0–8.0)

## 2020-03-19 LAB — POCT CBG (FASTING - GLUCOSE)-MANUAL ENTRY: Glucose Fasting, POC: 110 mg/dL — AB (ref 70–99)

## 2020-03-19 MED ORDER — LISINOPRIL 40 MG PO TABS: 40.0000 mg | ORAL_TABLET | Freq: Every day | ORAL | 3 refills | Status: DC

## 2020-03-19 MED ORDER — GLIPIZIDE 10 MG PO TABS: 10.0000 mg | ORAL_TABLET | Freq: Two times a day (BID) | ORAL | 3 refills | Status: DC

## 2020-03-19 MED ORDER — TRUE METRIX BLOOD GLUCOSE TEST VI STRP: ORAL_STRIP | 3 refills | Status: DC

## 2020-03-19 MED ORDER — BUSPIRONE HCL 10 MG PO TABS: 10.0000 mg | ORAL_TABLET | Freq: Three times a day (TID) | ORAL | 3 refills | Status: DC

## 2020-03-19 MED ORDER — BASAGLAR KWIKPEN 100 UNIT/ML ~~LOC~~ SOPN: 50.0000 [IU] | PEN_INJECTOR | Freq: Every day | SUBCUTANEOUS | 3 refills | Status: DC

## 2020-03-19 MED ORDER — METFORMIN HCL 500 MG PO TABS: 1000.0000 mg | ORAL_TABLET | Freq: Two times a day (BID) | ORAL | 11 refills | Status: DC

## 2020-03-19 MED ORDER — IBUPROFEN 800 MG PO TABS: 800.0000 mg | ORAL_TABLET | Freq: Three times a day (TID) | ORAL | 6 refills | Status: DC | PRN

## 2020-03-19 MED ORDER — GLUCOSE BLOOD VI STRP: ORAL_STRIP | 3 refills | Status: DC

## 2020-03-19 MED ORDER — FUROSEMIDE 20 MG PO TABS
20.0000 mg | ORAL_TABLET | Freq: Every day | ORAL | 3 refills | Status: DC
  Filled 2020-07-24: qty 30, 30d supply, fill #0

## 2020-03-19 MED ORDER — BD PEN NEEDLE ORIGINAL U/F 29G X 12.7MM MISC: 2 refills | Status: DC

## 2020-03-19 MED ORDER — ATORVASTATIN CALCIUM 10 MG PO TABS: 10.0000 mg | ORAL_TABLET | Freq: Every day | ORAL | 3 refills | Status: DC

## 2020-03-19 MED ORDER — ALBUTEROL SULFATE HFA 108 (90 BASE) MCG/ACT IN AERS: 2.0000 | INHALATION_SPRAY | Freq: Four times a day (QID) | RESPIRATORY_TRACT | 11 refills | Status: DC | PRN

## 2020-03-19 MED FILL — TRUEPLUS SYR 1ML 31GX5/16: 31G X 5/16" | 90 days supply | Qty: 100 | Fill #3

## 2020-03-19 MED FILL — LISINOPRIL 40 MG TABLET: 40 | 30 days supply | Qty: 30 | Fill #0

## 2020-03-19 MED FILL — METFORMIN HCL 500 MG TABS: 500 | 30 days supply | Qty: 120 | Fill #1

## 2020-03-19 NOTE — Progress Notes (Signed)
[facility]   Established Patient Office Visit  Subjective:  Patient ID: Wanda Weaver, female    DOB: 1960/10/19  Age: 59 y.o. MRN: [id number]  CC:  Chief Complaint  Patient presents with  . Follow-up    HPI Wanda Weaver is a 59 year old female who presents for Follow Up today.    Patient Active Problem List   Diagnosis Date Noted  . Bilateral lower extremity pain 08/20/2019  . Bilateral lower extremity edema 08/20/2019  . Hemoglobin A1C greater than 9%, indicating poor diabetic control 02/21/2019  . Weight loss 02/21/2019  . Muscle strain of wrist, left, initial encounter 09/22/2018  . Hyperglycemia 08/15/2018  . Essential hypertension 04/25/2018  . Class 1 obesity due to excess calories with serious comorbidity in adult 04/25/2018  . Right foot pain 04/25/2018  . Anxiety 04/25/2018  . Type 2 diabetes mellitus with complication, with long-term current use of insulin ([place]) 08/06/2017    Current Status: Since her last office visit, she is doing well with no complaints.  She denies visual changes, chest pain, cough, shortness of breath, heart palpitations, and falls. She has occasional headaches and dizziness with position changes. Denies severe headaches, confusion, seizures, double vision, and blurred vision, nausea and vomiting. She denies fatigue, frequent urination, blurred vision, excessive hunger, excessive thirst, weight gain, weight loss, and poor wound healing. She continues to check her feet regularly. She denies fevers, chills, recent infections, weight loss, and night sweats.  Denies GI problems such as diarrhea, and constipation. She has no reports of blood in stools, dysuria and hematuria. No depression or anxiety reported today. She is taking all medications as prescribed. she denies pain today.   Past Medical History:  Diagnosis Date  . Bilateral lower extremity edema 07/2019  . Bilateral lower extremity pain 07/2019   . Diabetes mellitus without complication ([place])   . Hemoglobin A1C greater than 9%, indicating poor diabetic control   . Hyperlipidemia   . Hypertension   . Vitamin D deficiency     Past Surgical History:  Procedure Laterality Date  . APPENDECTOMY    . CARPAL TUNNEL RELEASE Bilateral   . CHOLECYSTECTOMY    . WRIST SURGERY Bilateral     Family History  Problem Relation Age of Onset  . Diabetes Mother   . Dementia Mother   . Diabetes Father   . Hypertension Father     Social History   Socioeconomic History  . Marital status: Single    Spouse name: Not on file  . Number of children: Not on file  . Years of education: Not on file  . Highest education level: Not on file  Occupational History  . Not on file  Tobacco Use  . Smoking status: Never Smoker  . Smokeless tobacco: Never Used  Vaping Use  . Vaping Use: Never used  Substance and Sexual Activity  . Alcohol use: No  . Drug use: No  . Sexual activity: Yes    Partners: Male  Other Topics Concern  . Not on file  Social History Narrative  . Not on file   Social Determinants of Health   Financial Resource Strain: Not on file  Food Insecurity: Not on file  Transportation Needs: Not on file  Physical Activity: Not on file  Stress: Not on file  Social Connections: Not on file  Intimate Partner Violence: Not on file    Outpatient Medications Prior to Visit  Medication Sig Dispense  Refill  . ALPRAZolam (XANAX) 0.5 MG tablet Take 1 tablet (0.5 mg total) by mouth at bedtime as needed for anxiety. 30 tablet 0  . blood glucose meter kit and supplies KIT Dispense based on patient and insurance preference. 100 each 12  . Insulin Syringe-Needle U-100 (BD INSULIN SYRINGE ULTRAFINE) 31G X 5/16" 0.5 ML MISC 0.5 Units by Does not apply route daily. 10 each 11  . Lancets 30G MISC 1 each by Does not apply route as directed. 100 each 12  . Multiple Vitamin (MULTIVITAMIN) tablet Take 1 tablet by mouth daily.    . Vitamin D,  Ergocalciferol, (DRISDOL) 1.25 MG (50000 UNIT) CAPS capsule Take 1 capsule (50,000 Units total) by mouth every 7 (seven) days. 5 capsule 6  . albuterol (PROVENTIL HFA;VENTOLIN HFA) 108 (90 Base) MCG/ACT inhaler Inhale 2 puffs into the lungs every 6 (six) hours as needed for wheezing or shortness of breath. 1 Inhaler 12  . atorvastatin (LIPITOR) 10 MG tablet Take 1 tablet (10 mg total) by mouth daily. 30 tablet 6  . busPIRone (BUSPAR) 10 MG tablet Take 1 tablet (10 mg total) by mouth 3 (three) times daily. 60 tablet 3  . furosemide (LASIX) 20 MG tablet Take 1 tablet (20 mg total) by mouth daily. 30 tablet 3  . glipiZIDE (GLUCOTROL) 10 MG tablet Take 1 tablet (10 mg total) by mouth 2 (two) times daily before a meal. 60 tablet 2  . glucose blood (ACCU-CHEK AVIVA) test strip Use as instructed 100 each 12  . glucose blood (TRUE METRIX BLOOD GLUCOSE TEST) test strip Use as instructed 100 each 12  . ibuprofen (ADVIL) 800 MG tablet Take 1 tablet (800 mg total) by mouth every 8 (eight) hours as needed. 30 tablet 3  . Insulin Glargine (BASAGLAR KWIKPEN) 100 UNIT/ML Inject 50 Units into the skin daily. 50 mL 0  . Insulin Pen Needle (BD ULTRA-FINE PEN NEEDLES) 29G X 12.7MM MISC ICD10 E11.9 for use with insulin administration 200 each 2  . lisinopril (ZESTRIL) 40 MG tablet TAKE 1 TABLET BY MOUTH DAILY 30 tablet 3  . metFORMIN (GLUCOPHAGE) 500 MG tablet Take 2 tablets (1,000 mg total) by mouth 2 (two) times daily with a meal. 120 tablet 11   No facility-administered medications prior to visit.    No Known Allergies  ROS Review of Systems  Constitutional: Negative.   HENT: Negative.   Eyes: Negative.   Respiratory: Negative.   Cardiovascular: Negative.   Gastrointestinal: Positive for abdominal distention (obese).  Endocrine: Negative.   Genitourinary: Negative.   Musculoskeletal: Positive for arthralgias (generalized joint pain).  Skin: Negative.   Allergic/Immunologic: Negative.   Neurological:  Positive for dizziness (occasional ) and headaches (occasional ).  Hematological: Negative.   Psychiatric/Behavioral: Negative.       Objective:    Physical Exam Vitals and nursing note reviewed.  Constitutional:      Appearance: Normal appearance.  HENT:     Head: Normocephalic and atraumatic.     Nose: Nose normal.     Mouth/Throat:     Mouth: Mucous membranes are moist.     Pharynx: Oropharynx is clear.  Cardiovascular:     Rate and Rhythm: Normal rate and regular rhythm.     Pulses: Normal pulses.     Heart sounds: Normal heart sounds.  Pulmonary:     Effort: Pulmonary effort is normal.     Breath sounds: Normal breath sounds.  Abdominal:     General: Bowel sounds are normal.  Palpations: Abdomen is soft.  Musculoskeletal:        General: Normal range of motion.     Cervical back: Normal range of motion and neck supple.  Skin:    General: Skin is warm and dry.  Neurological:     General: No focal deficit present.     Mental Status: She is alert and oriented to person, place, and time.  Psychiatric:        Mood and Affect: Mood normal.        Behavior: Behavior normal.        Thought Content: Thought content normal.        Judgment: Judgment normal.     BP (!) 186/96 (BP Location: Right Arm, Patient Position: Sitting, Cuff Size: Normal) Comment: out of meds x 1 wk  Pulse 77   Temp 98.1 F (36.7 C) (Temporal)   Resp 20   Ht _0  (1.575 m)   Wt 195 lb 9.6 oz (88.7 kg)   SpO2 100%   BMI 35.78 kg/m  Wt Readings from Last 3 Encounters:  03/19/20 195 lb 9.6 oz (88.7 kg)  11/17/19 194 lb (88 kg)  08/16/19 194 lb 3.2 oz (88.1 kg)     Health Maintenance Due  Topic Date Due  . MAMMOGRAM  Never done  . COLONOSCOPY  Never done  . OPHTHALMOLOGY EXAM  08/20/2018  . FOOT EXAM  02/22/2019    There are no preventive care reminders to display for this patient.  Lab Results  Component Value Date   TSH 1.040 06/30/2019   Lab Results  Component Value Date    WBC 6.2 06/30/2019   HGB 12.6 06/30/2019   HCT 38.0 06/30/2019   MCV 85 06/30/2019   PLT 220 06/30/2019   Lab Results  Component Value Date   NA 137 06/30/2019   K 4.6 06/30/2019   CO2 22 06/30/2019   GLUCOSE 251 (H) 06/30/2019   BUN 14 06/30/2019   CREATININE 0.78 06/30/2019   BILITOT 0.3 06/30/2019   ALKPHOS 85 06/30/2019   AST 20 06/30/2019   ALT 21 06/30/2019   PROT 6.2 06/30/2019   ALBUMIN 4.0 06/30/2019   CALCIUM 9.3 06/30/2019   Lab Results  Component Value Date   CHOL 232 (H) 06/30/2019   Lab Results  Component Value Date   HDL 47 06/30/2019   Lab Results  Component Value Date   LDLCALC 132 (H) 06/30/2019   Lab Results  Component Value Date   TRIG 297 (H) 06/30/2019   Lab Results  Component Value Date   CHOLHDL 4.9 (H) 06/30/2019   Lab Results  Component Value Date   HGBA1C 8.6 (A) 03/19/2020      Assessment & Plan:   1. Type 2 diabetes mellitus with complication, with long-term current use of insulin ([place]) He will continue medication as prescribed, to decrease foods/beverages high in sugars and carbs and follow Heart Healthy or DASH diet. Increase physical activity to at least 30 minutes cardio exercise daily.  - Glucose (CBG), Fasting - HgB A1c - POCT URINALYSIS DIP (CLINITEK) - glipiZIDE (GLUCOTROL) 10 MG tablet; Take 1 tablet (10 mg total) by mouth 2 (two) times daily before a meal.  Dispense: 180 tablet; Refill: 3 - glucose blood (ACCU-CHEK AVIVA) test strip; Use as instructed  Dispense: 300 each; Refill: 3 - Insulin Glargine (BASAGLAR KWIKPEN) 100 UNIT/ML; Inject 50 Units into the skin daily.  Dispense: 30 mL; Refill: 3 - metFORMIN (GLUCOPHAGE) 500 MG tablet; Take 2  tablets (1,000 mg total) by mouth 2 (two) times daily with a meal.  Dispense: 120 tablet; Refill: 11  2. Hemoglobin A1C between 7% and 9% indicating borderline diabetic control Decreased. Hgb A1c at 8.6 today, from 11.2 on 11/17/2019. Monitor.   3. Hyperglycemia  4. Essential  hypertension She will continue to take medications as prescribed, to decrease high sodium intake, excessive alcohol intake, increase potassium intake, smoking cessation, and increase physical activity of at least 30 minutes of cardio activity daily. She will continue to follow Heart Healthy or DASH diet. - lisinopril (ZESTRIL) 40 MG tablet; Take 1 tablet (40 mg total) by mouth daily.  Dispense: 90 tablet; Refill: 3  5. Bilateral lower extremity edema - furosemide (LASIX) 20 MG tablet; Take 1 tablet (20 mg total) by mouth daily.  Dispense: 90 tablet; Refill: 3  6. Hyperlipidemia, unspecified hyperlipidemia type - atorvastatin (LIPITOR) 10 MG tablet; Take 1 tablet (10 mg total) by mouth daily.  Dispense: 90 tablet; Refill: 3  7. Shortness of breath - albuterol (VENTOLIN HFA) 108 (90 Base) MCG/ACT inhaler; Inhale 2 puffs into the lungs every 6 (six) hours as needed for wheezing or shortness of breath.  Dispense: 8 each; Refill: 11  8. Anxiety - busPIRone (BUSPAR) 10 MG tablet; Take 1 tablet (10 mg total) by mouth 3 (three) times daily.  Dispense: 90 tablet; Refill: 3  9. Muscle strain of wrist, left, initial encounter - ibuprofen (ADVIL) 800 MG tablet; Take 1 tablet (800 mg total) by mouth every 8 (eight) hours as needed.  Dispense: 60 tablet; Refill: 6  10. Medication refill - albuterol (VENTOLIN HFA) 108 (90 Base) MCG/ACT inhaler; Inhale 2 puffs into the lungs every 6 (six) hours as needed for wheezing or shortness of breath.  Dispense: 8 each; Refill: 11 - atorvastatin (LIPITOR) 10 MG tablet; Take 1 tablet (10 mg total) by mouth daily.  Dispense: 90 tablet; Refill: 3 - busPIRone (BUSPAR) 10 MG tablet; Take 1 tablet (10 mg total) by mouth 3 (three) times daily.  Dispense: 90 tablet; Refill: 3 - furosemide (LASIX) 20 MG tablet; Take 1 tablet (20 mg total) by mouth daily.  Dispense: 90 tablet; Refill: 3 - glipiZIDE (GLUCOTROL) 10 MG tablet; Take 1 tablet (10 mg total) by mouth 2 (two) times  daily before a meal.  Dispense: 180 tablet; Refill: 3 - ibuprofen (ADVIL) 800 MG tablet; Take 1 tablet (800 mg total) by mouth every 8 (eight) hours as needed.  Dispense: 60 tablet; Refill: 6 - glucose blood (ACCU-CHEK AVIVA) test strip; Use as instructed  Dispense: 300 each; Refill: 3 - glucose blood (TRUE METRIX BLOOD GLUCOSE TEST) test strip; Use as instructed  Dispense: 300 each; Refill: 3 - Insulin Glargine (BASAGLAR KWIKPEN) 100 UNIT/ML; Inject 50 Units into the skin daily.  Dispense: 30 mL; Refill: 3 - Insulin Pen Needle (BD ULTRA-FINE PEN NEEDLES) 29G X 12.7MM MISC; ICD10 E11.9 for use with insulin administration  Dispense: 200 each; Refill: 2 - lisinopril (ZESTRIL) 40 MG tablet; Take 1 tablet (40 mg total) by mouth daily.  Dispense: 90 tablet; Refill: 3  11. Follow up She will follow up in 3 months.   Meds ordered this encounter  Medications  . albuterol (VENTOLIN HFA) 108 (90 Base) MCG/ACT inhaler    Sig: Inhale 2 puffs into the lungs every 6 (six) hours as needed for wheezing or shortness of breath.    Dispense:  8 each    Refill:  11  . atorvastatin (LIPITOR) 10 MG tablet  Sig: Take 1 tablet (10 mg total) by mouth daily.    Dispense:  90 tablet    Refill:  3  . busPIRone (BUSPAR) 10 MG tablet    Sig: Take 1 tablet (10 mg total) by mouth 3 (three) times daily.    Dispense:  90 tablet    Refill:  3  . furosemide (LASIX) 20 MG tablet    Sig: Take 1 tablet (20 mg total) by mouth daily.    Dispense:  90 tablet    Refill:  3  . glipiZIDE (GLUCOTROL) 10 MG tablet    Sig: Take 1 tablet (10 mg total) by mouth 2 (two) times daily before a meal.    Dispense:  180 tablet    Refill:  3  . ibuprofen (ADVIL) 800 MG tablet    Sig: Take 1 tablet (800 mg total) by mouth every 8 (eight) hours as needed.    Dispense:  60 tablet    Refill:  6  . glucose blood (ACCU-CHEK AVIVA) test strip    Sig: Use as instructed    Dispense:  300 each    Refill:  3  . glucose blood (TRUE METRIX  BLOOD GLUCOSE TEST) test strip    Sig: Use as instructed    Dispense:  300 each    Refill:  3  . Insulin Glargine (BASAGLAR KWIKPEN) 100 UNIT/ML    Sig: Inject 50 Units into the skin daily.    Dispense:  30 mL    Refill:  3  . Insulin Pen Needle (BD ULTRA-FINE PEN NEEDLES) 29G X 12.7MM MISC    Sig: ICD10 E11.9 for use with insulin administration    Dispense:  200 each    Refill:  2  . lisinopril (ZESTRIL) 40 MG tablet    Sig: Take 1 tablet (40 mg total) by mouth daily.    Dispense:  90 tablet    Refill:  3  . metFORMIN (GLUCOPHAGE) 500 MG tablet    Sig: Take 2 tablets (1,000 mg total) by mouth 2 (two) times daily with a meal.    Dispense:  120 tablet    Refill:  11    Orders Placed This Encounter  Procedures  . Glucose (CBG), Fasting  . HgB A1c  . POCT URINALYSIS DIP (CLINITEK)    Referral Orders  No referral(s) requested today    Kathe Becton, MSN, ANE, FNP-BC [facility] [place], [place] [address] [phone] fax  Problem List Items Addressed This Visit      Cardiovascular and Mediastinum   Essential hypertension   Relevant Medications   atorvastatin (LIPITOR) 10 MG tablet   furosemide (LASIX) 20 MG tablet   lisinopril (ZESTRIL) 40 MG tablet     Endocrine   Hemoglobin A1C greater than 9%, indicating poor diabetic control   Relevant Medications   atorvastatin (LIPITOR) 10 MG tablet   glipiZIDE (GLUCOTROL) 10 MG tablet   Insulin Glargine (BASAGLAR KWIKPEN) 100 UNIT/ML   lisinopril (ZESTRIL) 40 MG tablet   metFORMIN (GLUCOPHAGE) 500 MG tablet   Type 2 diabetes mellitus with complication, with long-term current use of insulin ([facility]) - Primary   Relevant Medications   atorvastatin (LIPITOR) 10 MG tablet   glipiZIDE (GLUCOTROL) 10 MG tablet   glucose blood (ACCU-CHEK AVIVA) test strip   Insulin Glargine (BASAGLAR KWIKPEN) 100 UNIT/ML    lisinopril (ZESTRIL) 40 MG tablet   metFORMIN (GLUCOPHAGE) 500 MG  tablet   Other Relevant Orders   Glucose (CBG), Fasting (Completed)   HgB A1c (Completed)   POCT URINALYSIS DIP (CLINITEK) (Completed)     Musculoskeletal and Integument   Muscle strain of wrist, left, initial encounter   Relevant Medications   ibuprofen (ADVIL) 800 MG tablet     Other   Anxiety   Relevant Medications   busPIRone (BUSPAR) 10 MG tablet   Bilateral lower extremity edema   Relevant Medications   furosemide (LASIX) 20 MG tablet   Hyperglycemia    Other Visit Diagnoses    Hyperlipidemia, unspecified hyperlipidemia type       Relevant Medications   atorvastatin (LIPITOR) 10 MG tablet   furosemide (LASIX) 20 MG tablet   lisinopril (ZESTRIL) 40 MG tablet   Shortness of breath       Relevant Medications   albuterol (VENTOLIN HFA) 108 (90 Base) MCG/ACT inhaler   Medication refill       Relevant Medications   albuterol (VENTOLIN HFA) 108 (90 Base) MCG/ACT inhaler   atorvastatin (LIPITOR) 10 MG tablet   busPIRone (BUSPAR) 10 MG tablet   furosemide (LASIX) 20 MG tablet   glipiZIDE (GLUCOTROL) 10 MG tablet   ibuprofen (ADVIL) 800 MG tablet   glucose blood (ACCU-CHEK AVIVA) test strip   glucose blood (TRUE METRIX BLOOD GLUCOSE TEST) test strip   Insulin Glargine (BASAGLAR KWIKPEN) 100 UNIT/ML   Insulin Pen Needle (BD ULTRA-FINE PEN NEEDLES) 29G X 12.7MM MISC   lisinopril (ZESTRIL) 40 MG tablet   Follow up          Meds ordered this encounter  Medications  . albuterol (VENTOLIN HFA) 108 (90 Base) MCG/ACT inhaler    Sig: Inhale 2 puffs into the lungs every 6 (six) hours as needed for wheezing or shortness of breath.    Dispense:  8 each    Refill:  11  . atorvastatin (LIPITOR) 10 MG tablet    Sig: Take 1 tablet (10 mg total) by mouth daily.    Dispense:  90 tablet    Refill:  3  . busPIRone (BUSPAR) 10 MG tablet    Sig: Take 1 tablet (10 mg total) by mouth 3 (three) times daily.    Dispense:   90 tablet    Refill:  3  . furosemide (LASIX) 20 MG tablet    Sig: Take 1 tablet (20 mg total) by mouth daily.    Dispense:  90 tablet    Refill:  3  . glipiZIDE (GLUCOTROL) 10 MG tablet    Sig: Take 1 tablet (10 mg total) by mouth 2 (two) times daily before a meal.    Dispense:  180 tablet    Refill:  3  . ibuprofen (ADVIL) 800 MG tablet    Sig: Take 1 tablet (800 mg total) by mouth every 8 (eight) hours as needed.    Dispense:  60 tablet    Refill:  6  . glucose blood (ACCU-CHEK AVIVA) test strip    Sig: Use as instructed    Dispense:  300 each    Refill:  3  . glucose blood (TRUE METRIX BLOOD GLUCOSE TEST) test strip    Sig: Use as instructed    Dispense:  300 each    Refill:  3  . Insulin Glargine (BASAGLAR KWIKPEN) 100 UNIT/ML    Sig: Inject 50 Units into the skin daily.    Dispense:  30 mL    Refill:  3  . Insulin Pen Needle (BD  ULTRA-FINE PEN NEEDLES) 29G X 12.7MM MISC    Sig: ICD10 E11.9 for use with insulin administration    Dispense:  200 each    Refill:  2  . lisinopril (ZESTRIL) 40 MG tablet    Sig: Take 1 tablet (40 mg total) by mouth daily.    Dispense:  90 tablet    Refill:  3  . metFORMIN (GLUCOPHAGE) 500 MG tablet    Sig: Take 2 tablets (1,000 mg total) by mouth 2 (two) times daily with a meal.    Dispense:  120 tablet    Refill:  11    Follow-up: No follow-ups on file.    Azzie Glatter, FNP

## 2020-03-19 NOTE — Progress Notes (Signed)
Needs RF on lisinopril, out since last week, also needs RF on insulin and syringes w/ needle, metformin

## 2020-03-20 MED FILL — !LANTUS 100 UNITS/ML VIAL: 100 | 20 days supply | Qty: 10 | Fill #2

## 2020-03-21 ENCOUNTER — Encounter: Payer: Self-pay | Admitting: Family Medicine

## 2020-04-15 MED FILL — LISINOPRIL 40 MG TABLET: 40 | 30 days supply | Qty: 30 | Fill #1

## 2020-04-22 MED FILL — $LANTUS 100 UNITS/ML VIAL: 100 | 20 days supply | Qty: 10 | Fill #3

## 2020-04-24 ENCOUNTER — Encounter: Payer: Self-pay | Admitting: Family Medicine

## 2020-04-24 ENCOUNTER — Other Ambulatory Visit: Payer: Self-pay

## 2020-04-24 ENCOUNTER — Telehealth (INDEPENDENT_AMBULATORY_CARE_PROVIDER_SITE_OTHER): Payer: Medicaid Other | Admitting: Family Medicine

## 2020-04-24 VITALS — Ht 62.0 in | Wt 186.0 lb

## 2020-04-24 DIAGNOSIS — E118 Type 2 diabetes mellitus with unspecified complications: Secondary | ICD-10-CM | POA: Diagnosis not present

## 2020-04-24 DIAGNOSIS — Z794 Long term (current) use of insulin: Secondary | ICD-10-CM

## 2020-04-24 DIAGNOSIS — R7303 Prediabetes: Secondary | ICD-10-CM | POA: Diagnosis not present

## 2020-04-24 DIAGNOSIS — R519 Headache, unspecified: Secondary | ICD-10-CM

## 2020-04-24 DIAGNOSIS — E119 Type 2 diabetes mellitus without complications: Secondary | ICD-10-CM

## 2020-04-24 DIAGNOSIS — Z09 Encounter for follow-up examination after completed treatment for conditions other than malignant neoplasm: Secondary | ICD-10-CM

## 2020-04-24 NOTE — Progress Notes (Signed)
Virtual Visit via Telephone Note  I connected with Wanda Weaver on 04/24/20 at  2:40 PM EST by telephone and verified that I am speaking with the correct person using two identifiers.  Location: Patient: Home Provider: Office   I discussed the limitations, risks, security and privacy concerns of performing an evaluation and management service by telephone and the availability of in person appointments. I also discussed with the patient that there may be a patient responsible charge related to this service. The patient expressed understanding and agreed to proceed.   History of Present Illness:  Patient Active Problem List   Diagnosis Date Noted  . Bilateral lower extremity pain 08/20/2019  . Bilateral lower extremity edema 08/20/2019  . Hemoglobin A1C greater than 9%, indicating poor diabetic control 02/21/2019  . Weight loss 02/21/2019  . Muscle strain of wrist, left, initial encounter 09/22/2018  . Hyperglycemia 08/15/2018  . Essential hypertension 04/25/2018  . Class 1 obesity due to excess calories with serious comorbidity in adult 04/25/2018  . Right foot pain 04/25/2018  . Anxiety 04/25/2018  . Type 2 diabetes mellitus with complication, with long-term current use of insulin ([facility]) 08/06/2017   Current Status: Since her last office visit, she has c/o increase incidents of headaches over the past week or so. She states that she has been taking OTC Cold/Sinus medication, which she states that she is feeling much better today. Normal blood glucose levels have been 80-120 range. She denies fatigue, frequent urination, blurred vision, excessive hunger, excessive thirst, weight gain, weight loss, and poor wound healing. She continues to check her feet regularly. She denies fevers, chills, fatigue, recent infections, weight loss, and night sweats. She has not had any headaches, dizziness, and falls. No chest pain, heart palpitations, cough and shortness of breath reported. Denies GI problems  such as nausea, vomiting, diarrhea, and constipation. She has no reports of blood in stools, dysuria and hematuria. No depression or anxiety reported today. She is taking all medications as prescribed. She denies pain today.   Observations/Objective:  Telephone Virtual Visit   Assessment and Plan:  1. Nonintractable headache, unspecified chronicity pattern, unspecified headache type Stable today. She is advised to seek testing for Coronavirus.  2. Type 2 diabetes mellitus with complication, with long-term current use of insulin ([facility]) She will continue medication as prescribed, to decrease foods/beverages high in sugars and carbs and follow Heart Healthy or DASH diet. Increase physical activity to at least 30 minutes cardio exercise daily.   3. Hemoglobin A1C between 7% and 9% indicating borderline diabetic control Most recent Hgb A1c stable at 8.6 on 03/19/2020. Monitor.   4. Follow up She will follow up 06/2020 for Annual Physical and Labwork.  No orders of the defined types were placed in this encounter.   No orders of the defined types were placed in this encounter.   Referral Orders  No referral(s) requested today    Raliegh Ip, MSN, ANE, FNP-BC [facility] [address] [place], [place] [address] [phone] fax    I discussed the assessment and treatment plan with the patient. The patient was provided an opportunity to ask questions and all were answered. The patient agreed with the plan and demonstrated an understanding of the instructions.   The patient was advised to call back or seek an in-person evaluation if the symptoms worsen or if the condition fails to improve as anticipated.  I provided 20 minutes of  non-face-to-face time during this encounter.   Azzie Glatter, FNP

## 2020-04-25 ENCOUNTER — Encounter: Payer: Self-pay | Admitting: Family Medicine

## 2020-05-06 ENCOUNTER — Other Ambulatory Visit: Payer: Self-pay | Admitting: Family Medicine

## 2020-05-06 ENCOUNTER — Telehealth: Payer: Self-pay | Admitting: Family Medicine

## 2020-05-06 DIAGNOSIS — Z760 Encounter for issue of repeat prescription: Secondary | ICD-10-CM

## 2020-05-06 DIAGNOSIS — Z794 Long term (current) use of insulin: Secondary | ICD-10-CM

## 2020-05-06 DIAGNOSIS — E118 Type 2 diabetes mellitus with unspecified complications: Secondary | ICD-10-CM

## 2020-05-06 MED ORDER — BASAGLAR KWIKPEN 100 UNIT/ML ~~LOC~~ SOPN: 50.0000 [IU] | PEN_INJECTOR | Freq: Every day | SUBCUTANEOUS | 11 refills | Status: DC

## 2020-05-06 MED FILL — $LANTUS 100 UNITS/ML VIAL: 100 | 20 days supply | Qty: 10 | Fill #4

## 2020-05-06 NOTE — Telephone Encounter (Signed)
Done

## 2020-05-14 MED FILL — LISINOPRIL 40 MG TABLET: 40 | 30 days supply | Qty: 30 | Fill #2

## 2020-05-21 MED FILL — METFORMIN HCL 500 MG TABS: 500 | 30 days supply | Qty: 120 | Fill #2

## 2020-05-27 ENCOUNTER — Other Ambulatory Visit: Payer: Self-pay | Admitting: Family Medicine

## 2020-05-27 DIAGNOSIS — Z794 Long term (current) use of insulin: Secondary | ICD-10-CM

## 2020-05-29 ENCOUNTER — Other Ambulatory Visit: Payer: Self-pay | Admitting: Family Medicine

## 2020-05-29 ENCOUNTER — Telehealth: Payer: Self-pay | Admitting: Family Medicine

## 2020-05-29 DIAGNOSIS — Z794 Long term (current) use of insulin: Secondary | ICD-10-CM

## 2020-05-29 MED FILL — ?BASAGLAR 100 UNITS/ML KWPE: 100 | 30 days supply | Qty: 15 | Fill #0

## 2020-05-29 NOTE — Telephone Encounter (Signed)
Pt asking for refill LANTUS. States 3 days no insulin. Please advise.

## 2020-05-29 NOTE — Telephone Encounter (Signed)
Sent refill to pharmacy , called and lvm for patient to inform of this.

## 2020-05-30 MED FILL — $LANTUS 100 UNITS/ML VIAL: 100 | 40 days supply | Qty: 20 | Fill #0

## 2020-05-30 NOTE — Telephone Encounter (Signed)
Lantus requires prior auth.  Alternatives include: novolog, levemir, novolog mix, humalog mix, novolog relion.  Are any alternatives appropriate or continue with prior auth?  KEY: [id number]

## 2020-05-31 ENCOUNTER — Other Ambulatory Visit: Payer: Self-pay | Admitting: Family Medicine

## 2020-05-31 DIAGNOSIS — Z794 Long term (current) use of insulin: Secondary | ICD-10-CM

## 2020-05-31 DIAGNOSIS — E118 Type 2 diabetes mellitus with unspecified complications: Secondary | ICD-10-CM

## 2020-05-31 MED ORDER — INSULIN DETEMIR 100 UNIT/ML ~~LOC~~ SOLN: 50.0000 [IU] | Freq: Every day | SUBCUTANEOUS | 11 refills | Status: DC

## 2020-05-31 NOTE — Telephone Encounter (Signed)
Alternative Rx for Levemir sent to pharmacy today.

## 2020-06-14 MED FILL — LISINOPRIL 40 MG TAB: 40 | 30 days supply | Qty: 30 | Fill #3

## 2020-07-03 MED FILL — LANTUS 100 UNITS/ML VIAL: 100 | 20 days supply | Qty: 10 | Fill #1

## 2020-07-13 ENCOUNTER — Other Ambulatory Visit: Payer: Self-pay

## 2020-07-15 ENCOUNTER — Other Ambulatory Visit: Payer: Self-pay

## 2020-07-15 MED FILL — Lisinopril Tab 40 MG: ORAL | 30 days supply | Qty: 30 | Fill #0 | Status: AC

## 2020-07-16 ENCOUNTER — Other Ambulatory Visit: Payer: Self-pay

## 2020-07-17 ENCOUNTER — Ambulatory Visit (INDEPENDENT_AMBULATORY_CARE_PROVIDER_SITE_OTHER): Payer: Self-pay | Admitting: Family Medicine

## 2020-07-17 ENCOUNTER — Other Ambulatory Visit: Payer: Self-pay

## 2020-07-17 ENCOUNTER — Encounter: Payer: Self-pay | Admitting: Family Medicine

## 2020-07-17 VITALS — BP 179/79 | HR 82 | Ht 62.0 in | Wt 202.0 lb

## 2020-07-17 DIAGNOSIS — Z09 Encounter for follow-up examination after completed treatment for conditions other than malignant neoplasm: Secondary | ICD-10-CM

## 2020-07-17 DIAGNOSIS — E118 Type 2 diabetes mellitus with unspecified complications: Secondary | ICD-10-CM

## 2020-07-17 DIAGNOSIS — E119 Type 2 diabetes mellitus without complications: Secondary | ICD-10-CM

## 2020-07-17 DIAGNOSIS — Z0000 Encounter for general adult medical examination without abnormal findings: Secondary | ICD-10-CM

## 2020-07-17 DIAGNOSIS — R739 Hyperglycemia, unspecified: Secondary | ICD-10-CM

## 2020-07-17 DIAGNOSIS — M79605 Pain in left leg: Secondary | ICD-10-CM

## 2020-07-17 DIAGNOSIS — M79604 Pain in right leg: Secondary | ICD-10-CM

## 2020-07-17 DIAGNOSIS — Z1231 Encounter for screening mammogram for malignant neoplasm of breast: Secondary | ICD-10-CM

## 2020-07-17 DIAGNOSIS — R600 Localized edema: Secondary | ICD-10-CM

## 2020-07-17 DIAGNOSIS — I10 Essential (primary) hypertension: Secondary | ICD-10-CM

## 2020-07-17 DIAGNOSIS — Z1211 Encounter for screening for malignant neoplasm of colon: Secondary | ICD-10-CM

## 2020-07-17 DIAGNOSIS — Z794 Long term (current) use of insulin: Secondary | ICD-10-CM

## 2020-07-17 DIAGNOSIS — R7303 Prediabetes: Secondary | ICD-10-CM

## 2020-07-17 NOTE — Patient Instructions (Addendum)
Please contact your Opthalmaologist and schedule a Diabetic Eye Exam.  An order for Cologuard has been sent to [employer].  You should receive a phone call regarding this request.  They will then mail you the kit and information on use.  If you have any questions or have not heard from them in 2 weeks, feel free to reach out to them: [employer] # [phone] Fax# [phone]   Indications for use Cologuard is intended for the qualitative detection of colorectal neoplasia associated DNA markers and for the presence of occult hemoglobin in human stool. A positive result may indicate the presence of colorectal cancer (CRC) or advanced adenoma (AA) and should be followed by diagnostic colonoscopy. Cologuard is indicated to screen adults of either sex, 12 years or older, who are at typical average risk for CRC. Cologuard is not a replacement for diagnostic colonoscopy or surveillance colonoscopy in high risk individuals.  Important risk information Cologuard should not be used if you:  have a history of colorectal cancer, adenomas or other related cancers had a positive result from another colorectal cancer screening test within the last 6 months have a condition that places you at high risk for colorectal cancer, including Inflammatory Bowel Disease (IBD) or certain hereditary syndromes or a family history of colorectal cancer Talk to your healthcare provider if any of these situations apply to you.  The Cologuard test result should be interpreted with caution. A positive test result does not confirm the presence of cancer. Patients with a positive test result should be referred for diagnostic colonoscopy.  A negative test result does not confirm the absence of cancer. Patients with a negative test result should discuss with their healthcare provider when they need to be tested again.  False positives and false negatives can occur. In a clinical study, 13% of people  without cancer received a positive result (false positive) and 8% of people with cancer received a negative result (false negative). It is important to talk to your doctor about your test results.  Cologuard performance in adults ages 38-49 is estimated based on a large clinical study of patients 15 and older.  Do not provide a sample for Cologuard if you have diarrhea or have blood in your urine or stool.  The risks related to using the Cologuard collection kit are low, with no serious adverse events reported among people in a clinical trial. You should be careful when opening and closing the lids to avoid the risk of hand strain.  Please read the Instructions for Use and talk to your healthcare provider to see if Cologuard is right for you. This product is available by prescription only.  Cologuard colorectal cancer screening test is a [employer].

## 2020-07-17 NOTE — Progress Notes (Signed)
[facility]   Established Patient Office Visit  Subjective:  Patient ID: Wanda Weaver, female    DOB: 1961/02/06  Age: 60 y.o. MRN: [id number]  CC:  Chief Complaint  Patient presents with  . Annual Exam    HPI Wanda Weaver is a 60 year old female who presents for Follow Up today.    Patient Active Problem List   Diagnosis Date Noted  . Bilateral lower extremity pain 08/20/2019  . Bilateral lower extremity edema 08/20/2019  . Hemoglobin A1C greater than 9%, indicating poor diabetic control 02/21/2019  . Weight loss 02/21/2019  . Muscle strain of wrist, left, initial encounter 09/22/2018  . Hyperglycemia 08/15/2018  . Essential hypertension 04/25/2018  . Class 1 obesity due to excess calories with serious comorbidity in adult 04/25/2018  . Right foot pain 04/25/2018  . Anxiety 04/25/2018  . Type 2 diabetes mellitus with complication, with long-term current use of insulin ([place]) 08/06/2017   Current Status: Since her last office visit, she is doing well with no complaints. She has not been monitoring her glucose levels regularly lately. She denies fatigue, frequent urination, blurred vision, excessive hunger, excessive thirst, weight gain, weight loss, and poor wound healing. She continues to check her feet regularly. Her blood pressure readings are elevated which she r/t increase stress at work. She is taking medications as prescribed. She denies visual changes, chest pain, cough, shortness of breath, heart palpitations, and falls. She has occasional headaches and dizziness with position changes. Denies severe headaches, confusion, seizures, double vision, and blurred vision, nausea and vomiting. Her anxiety is increase today r/t personal, health, and family stressors. She denies fevers, chills, recent infections, weight loss, and night sweats. Denies GI problems such as diarrhea, and constipation. She has no reports of blood in stools,  dysuria and hematuria. No depression or anxiety reported today. She is taking all medications as prescribed. She denies pain today.   Past Medical History:  Diagnosis Date  . Bilateral lower extremity edema 07/2019  . Bilateral lower extremity pain 07/2019  . Diabetes mellitus without complication ([place])   . Hemoglobin A1C greater than 9%, indicating poor diabetic control   . Hyperlipidemia   . Hypertension   . Vitamin D deficiency     Past Surgical History:  Procedure Laterality Date  . APPENDECTOMY    . CARPAL TUNNEL RELEASE Bilateral   . CHOLECYSTECTOMY    . WRIST SURGERY Bilateral     Family History  Problem Relation Age of Onset  . Diabetes Mother   . Dementia Mother   . Diabetes Father   . Hypertension Father     Social History   Socioeconomic History  . Marital status: Single    Spouse name: Not on file  . Number of children: Not on file  . Years of education: Not on file  . Highest education level: Not on file  Occupational History  . Not on file  Tobacco Use  . Smoking status: Never Smoker  . Smokeless tobacco: Never Used  Vaping Use  . Vaping Use: Never used  Substance and Sexual Activity  . Alcohol use: No  . Drug use: No  . Sexual activity: Yes    Partners: Male  Other Topics Concern  . Not on file  Social History Narrative  . Not on file   Social Determinants of Health   Financial Resource Strain: Not on file  Food Insecurity: Not on file  Transportation Needs: Not  on file  Physical Activity: Not on file  Stress: Not on file  Social Connections: Not on file  Intimate Partner Violence: Not on file    Outpatient Medications Prior to Visit  Medication Sig Dispense Refill  . albuterol (VENTOLIN HFA) 108 (90 Base) MCG/ACT inhaler Inhale 2 puffs into the lungs every 6 (six) hours as needed for wheezing or shortness of breath. 8 each 11  . atorvastatin (LIPITOR) 10 MG tablet Take 1 tablet (10 mg total) by mouth daily. 90 tablet 3  . blood  glucose meter kit and supplies KIT Dispense based on patient and insurance preference. 100 each 12  . busPIRone (BUSPAR) 10 MG tablet Take 1 tablet (10 mg total) by mouth 3 (three) times daily. 90 tablet 3  . furosemide (LASIX) 20 MG tablet Take 1 tablet (20 mg total) by mouth daily. 90 tablet 3  . glipiZIDE (GLUCOTROL) 10 MG tablet Take 1 tablet (10 mg total) by mouth 2 (two) times daily before a meal. 180 tablet 3  . glucose blood (ACCU-CHEK AVIVA) test strip Use as instructed 300 each 3  . glucose blood (TRUE METRIX BLOOD GLUCOSE TEST) test strip Use as instructed 300 each 3  . ibuprofen (ADVIL) 800 MG tablet Take 1 tablet (800 mg total) by mouth every 8 (eight) hours as needed. 60 tablet 6  . insulin detemir (LEVEMIR) 100 UNIT/ML injection INJECT 0.5 MLS (50 UNITS TOTAL) INTO THE SKIN DAILY. 10 mL 11  . Insulin Glargine (BASAGLAR KWIKPEN) 100 UNIT/ML Inject 50 Units into the skin daily. 30 mL 11  . Insulin Pen Needle (BD ULTRA-FINE PEN NEEDLES) 29G X 12.7MM MISC ICD10 E11.9 for use with insulin administration 200 each 2  . Insulin Syringe-Needle U-100 (BD INSULIN SYRINGE ULTRAFINE) 31G X 5/16" 0.5 ML MISC 0.5 Units by Does not apply route daily. 10 each 11  . Lancets 30G MISC 1 each by Does not apply route as directed. 100 each 12  . lisinopril (ZESTRIL) 40 MG tablet TAKE 1 TABLET (40 MG TOTAL) BY MOUTH DAILY. 90 tablet 3  . metFORMIN (GLUCOPHAGE) 500 MG tablet Take 2 tablets (1,000 mg total) by mouth 2 (two) times daily with a meal. 120 tablet 11  . Multiple Vitamin (MULTIVITAMIN) tablet Take 1 tablet by mouth daily.    . Vitamin D, Ergocalciferol, (DRISDOL) 1.25 MG (50000 UNIT) CAPS capsule Take 1 capsule (50,000 Units total) by mouth every 7 (seven) days. 5 capsule 6  . metFORMIN (GLUCOPHAGE) 500 MG tablet TAKE 2 TABLETS (1,000 MG TOTAL) BY MOUTH 2 (TWO) TIMES DAILY WITH A MEAL. 120 tablet 11   No facility-administered medications prior to visit.    No Known Allergies  ROS Review of  Systems  Constitutional: Negative.   HENT: Negative.   Eyes: Negative.   Respiratory: Negative.   Cardiovascular: Negative.   Gastrointestinal: Positive for abdominal distention (obese).  Endocrine: Negative.   Genitourinary: Negative.   Musculoskeletal: Positive for arthralgias (generalized ).       Bilateral leg pain  Skin: Negative.   Allergic/Immunologic: Negative.   Neurological: Positive for dizziness (occasional) and headaches (occasional ).  Hematological: Negative.   Psychiatric/Behavioral: Positive for agitation. The patient is nervous/anxious.       Objective:    Physical Exam Vitals and nursing note reviewed.  Constitutional:      Appearance: Normal appearance.  HENT:     Head: Normocephalic and atraumatic.     Nose: Nose normal.     Mouth/Throat:  Mouth: Mucous membranes are moist.     Pharynx: Oropharynx is clear.  Cardiovascular:     Rate and Rhythm: Normal rate and regular rhythm.     Pulses: Normal pulses.     Heart sounds: Normal heart sounds.  Pulmonary:     Effort: Pulmonary effort is normal.     Breath sounds: Normal breath sounds.  Abdominal:     General: Bowel sounds are normal. There is distension (obese).     Palpations: Abdomen is soft.  Musculoskeletal:        General: Swelling (bilateral lower extremity) present.     Cervical back: Normal range of motion and neck supple.     Right lower leg: Edema present.     Left lower leg: Edema present.  Skin:    General: Skin is warm and dry.  Neurological:     General: No focal deficit present.     Mental Status: She is alert and oriented to person, place, and time.     Motor: Weakness present.     Gait: Gait abnormal.  Psychiatric:        Mood and Affect: Mood normal.        Behavior: Behavior normal.        Thought Content: Thought content normal.        Judgment: Judgment normal.     BP (!) 179/79   Pulse 82   Ht _0  (1.575 m)   Wt 202 lb (91.6 kg)   SpO2 100%   BMI 36.95  kg/m  Wt Readings from Last 3 Encounters:  07/17/20 202 lb (91.6 kg)  04/24/20 186 lb (84.4 kg)  03/19/20 195 lb 9.6 oz (88.7 kg)     Health Maintenance Due  Topic Date Due  . MAMMOGRAM  Never done  . FOOT EXAM  02/22/2019    There are no preventive care reminders to display for this patient.  Lab Results  Component Value Date   TSH 1.040 06/30/2019   Lab Results  Component Value Date   WBC 6.2 06/30/2019   HGB 12.6 06/30/2019   HCT 38.0 06/30/2019   MCV 85 06/30/2019   PLT 220 06/30/2019   Lab Results  Component Value Date   NA 137 06/30/2019   K 4.6 06/30/2019   CO2 22 06/30/2019   GLUCOSE 251 (H) 06/30/2019   BUN 14 06/30/2019   CREATININE 0.78 06/30/2019   BILITOT 0.3 06/30/2019   ALKPHOS 85 06/30/2019   AST 20 06/30/2019   ALT 21 06/30/2019   PROT 6.2 06/30/2019   ALBUMIN 4.0 06/30/2019   CALCIUM 9.3 06/30/2019   Lab Results  Component Value Date   CHOL 232 (H) 06/30/2019   Lab Results  Component Value Date   HDL 47 06/30/2019   Lab Results  Component Value Date   LDLCALC 132 (H) 06/30/2019   Lab Results  Component Value Date   TRIG 297 (H) 06/30/2019   Lab Results  Component Value Date   CHOLHDL 4.9 (H) 06/30/2019   Lab Results  Component Value Date   HGBA1C 8.6 (A) 03/19/2020      Assessment & Plan:   1. Type 2 diabetes mellitus with complication, with long-term current use of insulin ([place]) She will continue medication as prescribed, to decrease foods/beverages high in sugars and carbs and follow Heart Healthy or DASH diet. Increase physical activity to at least 30 minutes cardio exercise daily.   2. Hemoglobin A1C between 7% and 9% indicating borderline diabetic control  Hgb A1c is stable at 8.5 today. Monitor.  3. Hyperglycemia  4. Essential hypertension The current medical regimen is effective; blood pressure is stable today; continue present plan and medications as prescribed. She will continue to take medications as  prescribed, to decrease high sodium intake, excessive alcohol intake, increase potassium intake, smoking cessation, and increase physical activity of at least 30 minutes of cardio activity daily. She will continue to follow Heart Healthy or DASH diet.  5. Bilateral lower extremity edema  6. Bilateral lower extremity pain  7. Breast cancer screening by mammogram - MM 3D SCREEN BREAST BILATERAL; Future  8. Colon cancer screening - Cologuard  9. Healthcare maintenance - CBC with Differential - Hemoglobin A1c - Comprehensive metabolic panel - Lipid Panel - TSH - Vitamin B12 - Vitamin D, 25-hydroxy  10. Follow up She will follow up in 3 months.   No orders of the defined types were placed in this encounter.   Orders Placed This Encounter  Procedures  . MM 3D SCREEN BREAST BILATERAL  . Cologuard  . CBC with Differential  . Hemoglobin A1c  . Comprehensive metabolic panel  . Lipid Panel  . TSH  . Vitamin B12  . Vitamin D, 25-hydroxy    Referral Orders  No referral(s) requested today    Kathe Becton, MSN, ANE, FNP-BC [facility] [address] [employer], [place] [address] [phone] fax   Problem List Items Addressed This Visit      Cardiovascular and Mediastinum   Essential hypertension     Endocrine   Type 2 diabetes mellitus with complication, with long-term current use of insulin ([place]) - Primary     Other   Bilateral lower extremity edema   Bilateral lower extremity pain   Hyperglycemia    Other Visit Diagnoses    Hemoglobin A1C between 7% and 9% indicating borderline diabetic control       Breast cancer screening by mammogram       Relevant Orders   MM 3D SCREEN BREAST BILATERAL   Colon cancer screening       Relevant Orders   Cologuard   Healthcare maintenance       Relevant Orders   CBC with Differential   Hemoglobin A1c   Comprehensive  metabolic panel   Lipid Panel   TSH   Vitamin B12   Vitamin D, 25-hydroxy   Follow up          No orders of the defined types were placed in this encounter.   Follow-up: No follow-ups on file.    Azzie Glatter, FNP

## 2020-07-18 ENCOUNTER — Telehealth: Payer: Self-pay

## 2020-07-18 LAB — COMPREHENSIVE METABOLIC PANEL
ALT: 21 IU/L (ref 0–32)
AST: 19 IU/L (ref 0–40)
Albumin/Globulin Ratio: 1.9 (ref 1.2–2.2)
Albumin: 4.3 g/dL (ref 3.8–4.9)
Alkaline Phosphatase: 82 IU/L (ref 44–121)
BUN/Creatinine Ratio: 12 (ref 9–23)
BUN: 10 mg/dL (ref 6–24)
Bilirubin Total: 0.5 mg/dL (ref 0.0–1.2)
CO2: 20 mmol/L (ref 20–29)
Calcium: 9.7 mg/dL (ref 8.7–10.2)
Chloride: 104 mmol/L (ref 96–106)
Creatinine, Ser: 0.81 mg/dL (ref 0.57–1.00)
Globulin, Total: 2.3 g/dL (ref 1.5–4.5)
Glucose: 84 mg/dL (ref 65–99)
Potassium: 4.4 mmol/L (ref 3.5–5.2)
Sodium: 141 mmol/L (ref 134–144)
Total Protein: 6.6 g/dL (ref 6.0–8.5)
eGFR: 84 mL/min/{1.73_m2} (ref >59)

## 2020-07-18 LAB — CBC WITH DIFFERENTIAL/PLATELET
Basophils Absolute: 0.1 10*3/uL (ref 0.0–0.2)
Basos: 1 %
EOS (ABSOLUTE): 0.6 10*3/uL — ABNORMAL HIGH (ref 0.0–0.4)
Eos: 9 %
Hematocrit: 38.8 % (ref 34.0–46.6)
Hemoglobin: 13.2 g/dL (ref 11.1–15.9)
Immature Grans (Abs): 0 10*3/uL (ref 0.0–0.1)
Immature Granulocytes: 0 %
Lymphocytes Absolute: 1.7 10*3/uL (ref 0.7–3.1)
Lymphs: 28 %
MCH: 28.4 pg (ref 26.6–33.0)
MCHC: 34 g/dL (ref 31.5–35.7)
MCV: 83 fL (ref 79–97)
Monocytes Absolute: 0.4 10*3/uL (ref 0.1–0.9)
Monocytes: 7 %
Neutrophils Absolute: 3.3 10*3/uL (ref 1.4–7.0)
Neutrophils: 55 %
Platelets: 238 10*3/uL (ref 150–450)
RBC: 4.65 x10E6/uL (ref 3.77–5.28)
RDW: 13.2 % (ref 11.7–15.4)
WBC: 6 10*3/uL (ref 3.4–10.8)

## 2020-07-18 LAB — LIPID PANEL
Chol/HDL Ratio: 4.3 ratio (ref 0.0–4.4)
Cholesterol, Total: 275 mg/dL — ABNORMAL HIGH (ref 100–199)
HDL: 64 mg/dL (ref >39)
LDL Chol Calc (NIH): 191 mg/dL — ABNORMAL HIGH (ref 0–99)
Triglycerides: 113 mg/dL (ref 0–149)
VLDL Cholesterol Cal: 20 mg/dL (ref 5–40)

## 2020-07-18 LAB — HEMOGLOBIN A1C
Est. average glucose Bld gHb Est-mCnc: 197 mg/dL
Hgb A1c MFr Bld: 8.5 % — ABNORMAL HIGH (ref 4.8–5.6)

## 2020-07-18 LAB — TSH: TSH: 1.09 u[IU]/mL (ref 0.450–4.500)

## 2020-07-18 LAB — VITAMIN B12: Vitamin B-12: 416 pg/mL (ref 232–1245)

## 2020-07-18 LAB — VITAMIN D 25 HYDROXY (VIT D DEFICIENCY, FRACTURES): Vit D, 25-Hydroxy: 5.1 ng/mL — ABNORMAL LOW (ref 30.0–100.0)

## 2020-07-18 NOTE — Telephone Encounter (Signed)
Wants results nd can leave a message on her cell phone

## 2020-07-18 NOTE — Telephone Encounter (Signed)
Patient called requesting lab results.  Advised patient these have not been reviewed by provider but once they are she will be contacted.

## 2020-07-19 ENCOUNTER — Other Ambulatory Visit: Payer: Self-pay | Admitting: Family Medicine

## 2020-07-19 ENCOUNTER — Telehealth: Payer: Self-pay | Admitting: Family Medicine

## 2020-07-19 ENCOUNTER — Telehealth: Payer: Self-pay

## 2020-07-19 NOTE — Telephone Encounter (Signed)
Pt needs a call back about her labs results.

## 2020-07-19 NOTE — Telephone Encounter (Signed)
Please advise 

## 2020-07-22 ENCOUNTER — Other Ambulatory Visit: Payer: Self-pay | Admitting: Family Medicine

## 2020-07-22 ENCOUNTER — Telehealth: Payer: Self-pay

## 2020-07-22 ENCOUNTER — Other Ambulatory Visit: Payer: Self-pay

## 2020-07-22 DIAGNOSIS — Z794 Long term (current) use of insulin: Secondary | ICD-10-CM

## 2020-07-22 DIAGNOSIS — Z760 Encounter for issue of repeat prescription: Secondary | ICD-10-CM

## 2020-07-22 DIAGNOSIS — E118 Type 2 diabetes mellitus with unspecified complications: Secondary | ICD-10-CM

## 2020-07-22 MED ORDER — METFORMIN HCL 500 MG PO TABS
1000.0000 mg | ORAL_TABLET | Freq: Two times a day (BID) | ORAL | 11 refills | Status: DC
  Filled 2020-07-22: qty 120, 30d supply, fill #0
  Filled 2020-09-10: qty 120, 30d supply, fill #1
  Filled 2020-11-14: qty 120, 30d supply, fill #2
  Filled 2021-01-08: qty 120, 30d supply, fill #3
  Filled 2021-03-08: qty 120, 30d supply, fill #4
  Filled 2021-05-06: qty 120, 30d supply, fill #5
  Filled 2021-05-06: qty 120, 30d supply, fill #0
  Filled 2021-07-02: qty 120, 30d supply, fill #1

## 2020-07-22 MED ORDER — INSULIN DETEMIR 100 UNIT/ML ~~LOC~~ SOLN
SUBCUTANEOUS | 11 refills | Status: DC
  Filled 2020-07-22: qty 10, fill #0

## 2020-07-22 MED ORDER — BASAGLAR KWIKPEN 100 UNIT/ML ~~LOC~~ SOPN
50.0000 [IU] | PEN_INJECTOR | Freq: Every day | SUBCUTANEOUS | 11 refills | Status: DC
  Filled 2020-07-22: qty 15, 30d supply, fill #0

## 2020-07-22 NOTE — Telephone Encounter (Signed)
Med refill Metformin

## 2020-07-23 ENCOUNTER — Other Ambulatory Visit: Payer: Self-pay

## 2020-07-23 ENCOUNTER — Telehealth: Payer: Self-pay

## 2020-07-23 ENCOUNTER — Encounter: Payer: Self-pay | Admitting: Family Medicine

## 2020-07-23 NOTE — Telephone Encounter (Signed)
Spoke with pharmacy and patient should be able to get medication.  Patient informed.

## 2020-07-23 NOTE — Telephone Encounter (Signed)
Metformin rx managed.

## 2020-07-23 NOTE — Telephone Encounter (Signed)
Pt needs lantis vials [facility]

## 2020-07-24 ENCOUNTER — Ambulatory Visit: Payer: Self-pay | Admitting: Family Medicine

## 2020-07-24 ENCOUNTER — Other Ambulatory Visit: Payer: Self-pay

## 2020-07-25 ENCOUNTER — Other Ambulatory Visit: Payer: Self-pay

## 2020-07-25 MED ORDER — INSULIN GLARGINE 100 UNIT/ML ~~LOC~~ SOLN
50.0000 [IU] | Freq: Every day | SUBCUTANEOUS | 11 refills | Status: DC
  Filled 2020-07-25 – 2020-10-02 (×5): qty 10, 20d supply, fill #0
  Filled 2020-10-07: qty 40, 80d supply, fill #0
  Filled 2020-12-11 – 2020-12-13 (×2): qty 40, 80d supply, fill #1
  Filled 2020-12-24: qty 10, 20d supply, fill #1
  Filled 2020-12-24: qty 40, 80d supply, fill #1
  Filled 2021-01-13: qty 10, 20d supply, fill #2
  Filled ????-??-??: fill #1

## 2020-07-25 NOTE — Telephone Encounter (Signed)
No phone call today.

## 2020-08-12 ENCOUNTER — Encounter (HOSPITAL_COMMUNITY): Payer: Self-pay | Admitting: Emergency Medicine

## 2020-08-12 ENCOUNTER — Emergency Department (HOSPITAL_COMMUNITY)
Admission: EM | Admit: 2020-08-12 | Discharge: 2020-08-12 | Disposition: A | Discharge status: Home / Self Care | Payer: Self-pay | Attending: Emergency Medicine | Admitting: Emergency Medicine

## 2020-08-12 ENCOUNTER — Other Ambulatory Visit: Payer: Self-pay

## 2020-08-12 ENCOUNTER — Emergency Department (HOSPITAL_COMMUNITY): Payer: Self-pay

## 2020-08-12 DIAGNOSIS — Z794 Long term (current) use of insulin: Secondary | ICD-10-CM | POA: Insufficient documentation

## 2020-08-12 DIAGNOSIS — I10 Essential (primary) hypertension: Secondary | ICD-10-CM | POA: Insufficient documentation

## 2020-08-12 DIAGNOSIS — M7989 Other specified soft tissue disorders: Secondary | ICD-10-CM | POA: Insufficient documentation

## 2020-08-12 DIAGNOSIS — R059 Cough, unspecified: Secondary | ICD-10-CM | POA: Insufficient documentation

## 2020-08-12 DIAGNOSIS — Z7984 Long term (current) use of oral hypoglycemic drugs: Secondary | ICD-10-CM | POA: Insufficient documentation

## 2020-08-12 DIAGNOSIS — E119 Type 2 diabetes mellitus without complications: Secondary | ICD-10-CM | POA: Insufficient documentation

## 2020-08-12 DIAGNOSIS — Z79899 Other long term (current) drug therapy: Secondary | ICD-10-CM | POA: Insufficient documentation

## 2020-08-12 LAB — COMPREHENSIVE METABOLIC PANEL
ALT: 47 U/L — ABNORMAL HIGH (ref 0–44)
AST: 49 U/L — ABNORMAL HIGH (ref 15–41)
Albumin: 3.7 g/dL (ref 3.5–5.0)
Alkaline Phosphatase: 62 U/L (ref 38–126)
Anion gap: 9 (ref 5–15)
BUN: 11 mg/dL (ref 6–20)
CO2: 24 mmol/L (ref 22–32)
Calcium: 9.1 mg/dL (ref 8.9–10.3)
Chloride: 109 mmol/L (ref 98–111)
Creatinine, Ser: 0.76 mg/dL (ref 0.44–1.00)
GFR, Estimated: >60 mL/min (ref >60)
Glucose, Bld: 116 mg/dL — ABNORMAL HIGH (ref 70–99)
Potassium: 4 mmol/L (ref 3.5–5.1)
Sodium: 142 mmol/L (ref 135–145)
Total Bilirubin: 0.5 mg/dL (ref 0.3–1.2)
Total Protein: 6.7 g/dL (ref 6.5–8.1)

## 2020-08-12 LAB — URINALYSIS, ROUTINE W REFLEX MICROSCOPIC
Bilirubin Urine: NEGATIVE
Glucose, UA: NEGATIVE mg/dL
Hgb urine dipstick: NEGATIVE
Ketones, ur: NEGATIVE mg/dL
Nitrite: NEGATIVE
Protein, ur: NEGATIVE mg/dL
Specific Gravity, Urine: 1.011 (ref 1.005–1.030)
pH: 6 (ref 5.0–8.0)

## 2020-08-12 LAB — CBC
HCT: 36.6 % (ref 36.0–46.0)
Hemoglobin: 12 g/dL (ref 12.0–15.0)
MCH: 28.1 pg (ref 26.0–34.0)
MCHC: 32.8 g/dL (ref 30.0–36.0)
MCV: 85.7 fL (ref 80.0–100.0)
Platelets: 193 10*3/uL (ref 150–400)
RBC: 4.27 MIL/uL (ref 3.87–5.11)
RDW: 13.2 % (ref 11.5–15.5)
WBC: 4.7 10*3/uL (ref 4.0–10.5)
nRBC: 0 % (ref 0.0–0.2)

## 2020-08-12 LAB — LIPASE, BLOOD: Lipase: 29 U/L (ref 11–51)

## 2020-08-12 MED ORDER — DOXYCYCLINE HYCLATE 100 MG PO TABS
100.0000 mg | ORAL_TABLET | Freq: Two times a day (BID) | ORAL | 0 refills | Status: DC
  Filled 2020-08-12: qty 14, 7d supply, fill #0

## 2020-08-12 MED ORDER — ONDANSETRON 4 MG PO TBDP: 4.0000 mg | ORAL_TABLET | Freq: Once | ORAL | Status: DC | PRN

## 2020-08-12 MED ORDER — FUROSEMIDE 20 MG PO TABS
ORAL_TABLET | ORAL | 1 refills | Status: DC
  Filled 2020-08-12: qty 30, 30d supply, fill #0

## 2020-08-12 NOTE — Discharge Instructions (Addendum)
Contact your doctor's office so you can have some type an appointment in next couple weeks.  Tell them that you were seen in the emergency department and we felt like you need to be evaluated by them

## 2020-08-12 NOTE — ED Provider Notes (Signed)
[facility] DEPT Provider Note   CSN: [id number] Arrival date & time: 08/12/20  0510     History Chief Complaint  Patient presents with  . Nausea  . Cough  . Leg Swelling    Wanda Weaver is a 60 y.o. female.  Patient complains of a mild cough nonproductive and also minimal swelling in her legs she had been on Lasix before but not taking it now  The history is provided by the patient and medical records. No language interpreter was used.  Cough Cough characteristics:  Dry and non-productive Sputum characteristics:  Nondescript Severity:  Moderate Onset quality:  Sudden Timing:  Intermittent Progression:  Waxing and waning Chronicity:  New Associated symptoms: no chest pain, no eye discharge, no headaches and no rash        Past Medical History:  Diagnosis Date  . Bilateral lower extremity edema 07/2019  . Bilateral lower extremity pain 07/2019  . Diabetes mellitus without complication ([place])   . Hemoglobin A1C greater than 9%, indicating poor diabetic control   . Hyperlipidemia   . Hypertension   . Vitamin D deficiency     Patient Active Problem List   Diagnosis Date Noted  . Bilateral lower extremity pain 08/20/2019  . Bilateral lower extremity edema 08/20/2019  . Hemoglobin A1C greater than 9%, indicating poor diabetic control 02/21/2019  . Weight loss 02/21/2019  . Muscle strain of wrist, left, initial encounter 09/22/2018  . Hyperglycemia 08/15/2018  . Essential hypertension 04/25/2018  . Class 1 obesity due to excess calories with serious comorbidity in adult 04/25/2018  . Right foot pain 04/25/2018  . Anxiety 04/25/2018  . Type 2 diabetes mellitus with complication, with long-term current use of insulin ([place]) 08/06/2017    Past Surgical History:  Procedure Laterality Date  . APPENDECTOMY    . CARPAL TUNNEL RELEASE Bilateral   . CHOLECYSTECTOMY    . WRIST SURGERY Bilateral      OB History    Gravida  3   Para       Term      Preterm      AB      Living  2     SAB      IAB      Ectopic      Multiple      Live Births              Family History  Problem Relation Age of Onset  . Diabetes Mother   . Dementia Mother   . Diabetes Father   . Hypertension Father     Social History   Tobacco Use  . Smoking status: Never Smoker  . Smokeless tobacco: Never Used  Vaping Use  . Vaping Use: Never used  Substance Use Topics  . Alcohol use: No  . Drug use: No    Home Medications Prior to Admission medications   Medication Sig Start Date End Date Taking? Authorizing Provider  doxycycline (VIBRAMYCIN) 100 MG capsule Take 1 capsule (100 mg total) by mouth 2 (two) times daily. One po bid x 7 days 08/12/20  Yes Milton Ferguson, MD  furosemide (LASIX) 20 MG tablet Take 1 every day if needed for swelling in your legs 08/12/20  Yes Milton Ferguson, MD  albuterol (VENTOLIN HFA) 108 (90 Base) MCG/ACT inhaler Inhale 2 puffs into the lungs every 6 (six) hours as needed for wheezing or shortness of breath. 03/19/20   Azzie Glatter, FNP  atorvastatin (LIPITOR) 10 MG  tablet Take 1 tablet (10 mg total) by mouth daily. 03/19/20   Azzie Glatter, FNP  blood glucose meter kit and supplies KIT Dispense based on patient and insurance preference. 08/23/17   Dorena Dew, FNP  busPIRone (BUSPAR) 10 MG tablet Take 1 tablet (10 mg total) by mouth 3 (three) times daily. 03/19/20   Azzie Glatter, FNP  glipiZIDE (GLUCOTROL) 10 MG tablet Take 1 tablet (10 mg total) by mouth 2 (two) times daily before a meal. 03/19/20   Azzie Glatter, FNP  glucose blood (ACCU-CHEK AVIVA) test strip Use as instructed 03/19/20   Azzie Glatter, FNP  glucose blood (TRUE METRIX BLOOD GLUCOSE TEST) test strip Use as instructed 03/19/20   Azzie Glatter, FNP  ibuprofen (ADVIL) 800 MG tablet Take 1 tablet (800 mg total) by mouth every 8 (eight) hours as needed. 03/19/20   Azzie Glatter, FNP  insulin glargine (LANTUS) 100  UNIT/ML injection Inject 0.5 mLs (50 Units total) into the skin daily. 07/25/20   [name], NP  Insulin Pen Needle (BD ULTRA-FINE PEN NEEDLES) 29G X 12.7MM MISC ICD10 E11.9 for use with insulin administration 03/19/20   Azzie Glatter, FNP  Insulin Syringe-Needle U-100 (BD INSULIN SYRINGE ULTRAFINE) 31G X 5/16" 0.5 ML MISC 0.5 Units by Does not apply route daily. 07/10/19   Azzie Glatter, FNP  Lancets 30G MISC 1 each by Does not apply route as directed. 08/23/17   Dorena Dew, FNP  lisinopril (ZESTRIL) 40 MG tablet TAKE 1 TABLET (40 MG TOTAL) BY MOUTH DAILY. 03/19/20 03/19/21  Azzie Glatter, FNP  metFORMIN (GLUCOPHAGE) 500 MG tablet Take 2 tablets (1,000 mg total) by mouth 2 (two) times daily with a meal. 07/22/20   Azzie Glatter, FNP  Multiple Vitamin (MULTIVITAMIN) tablet Take 1 tablet by mouth daily.    [provider]  Vitamin D, Ergocalciferol, (DRISDOL) 1.25 MG (50000 UNIT) CAPS capsule Take 1 capsule (50,000 Units total) by mouth every 7 (seven) days. 07/04/19   Azzie Glatter, FNP    Allergies    Patient has no known allergies.  Review of Systems   Review of Systems  Constitutional: Negative for appetite change and fatigue.  HENT: Negative for congestion, ear discharge and sinus pressure.   Eyes: Negative for discharge.  Respiratory: Positive for cough.   Cardiovascular: Negative for chest pain.  Gastrointestinal: Negative for abdominal pain and diarrhea.  Genitourinary: Negative for frequency and hematuria.  Musculoskeletal: Negative for back pain.  Skin: Negative for rash.  Neurological: Negative for seizures and headaches.  Psychiatric/Behavioral: Negative for hallucinations.    Physical Exam Updated Vital Signs BP (!) 168/82   Pulse 86   Temp 99.8 F (37.7 C) (Oral)   Resp 16   Ht $R'5\' 2"'he$  (1.575 m)   Wt 86.2 kg   SpO2 97%   BMI 34.75 kg/m   Physical Exam Vitals and nursing note reviewed.  Constitutional:      Appearance: She is  well-developed.  HENT:     Head: Normocephalic.     Nose: Nose normal.  Eyes:     General: No scleral icterus.    Conjunctiva/sclera: Conjunctivae normal.  Neck:     Thyroid: No thyromegaly.  Cardiovascular:     Rate and Rhythm: Normal rate and regular rhythm.     Heart sounds: No murmur heard. No friction rub. No gallop.   Pulmonary:     Breath sounds: No stridor. No wheezing or rales.  Chest:     Chest wall: No tenderness.  Abdominal:     General: There is no distension.     Tenderness: There is no abdominal tenderness. There is no rebound.  Musculoskeletal:        General: Normal range of motion.     Cervical back: Neck supple.  Lymphadenopathy:     Cervical: No cervical adenopathy.  Skin:    Findings: No erythema or rash.  Neurological:     Mental Status: She is alert and oriented to person, place, and time.     Motor: No abnormal muscle tone.     Coordination: Coordination normal.  Psychiatric:        Behavior: Behavior normal.     ED Results / Procedures / Treatments   Labs (all labs ordered are listed, but only abnormal results are displayed) Labs Reviewed  COMPREHENSIVE METABOLIC PANEL - Abnormal; Notable for the following components:      Result Value   Glucose, Bld 116 (*)    AST 49 (*)    ALT 47 (*)    All other components within normal limits  URINALYSIS, ROUTINE W REFLEX MICROSCOPIC - Abnormal; Notable for the following components:   Leukocytes,Ua SMALL (*)    Bacteria, UA RARE (*)    All other components within normal limits  LIPASE, BLOOD  CBC    EKG None  Radiology DG Chest 2 View  Result Date: 08/12/2020 CLINICAL DATA:  Cough and fever for 2 days EXAM: CHEST - 2 VIEW COMPARISON:  Radiograph 02/06/2018 FINDINGS: No consolidation, features of edema, pneumothorax, or effusion. Pulmonary vascularity is normally distributed. The cardiomediastinal contours are unremarkable. No acute osseous or soft tissue abnormality. IMPRESSION: No acute  cardiopulmonary abnormality. Electronically Signed   By: Lovena Le M.D.   On: 08/12/2020 06:11    Procedures Procedures   Medications Ordered in ED Medications  ondansetron (ZOFRAN-ODT) disintegrating tablet 4 mg (has no administration in time range)    ED Course  I have reviewed the triage vital signs and the nursing notes.  Pertinent labs & imaging results that were available during my care of the patient were reviewed by me and considered in my medical decision making (see chart for details).    MDM Rules/Calculators/A&P                        Patient with a mild dry cough.  She is placed on some Doxy.  Also patient is placed on Lasix for swelling in her feet and referred back to her family doctor Final Clinical Impression(s) / ED Diagnoses Final diagnoses:  None    Rx / DC Orders ED Discharge Orders         Ordered    doxycycline (VIBRAMYCIN) 100 MG capsule  2 times daily        08/12/20 0740    furosemide (LASIX) 20 MG tablet        08/12/20 1610           Milton Ferguson, MD 08/12/20 [phone]

## 2020-08-12 NOTE — ED Triage Notes (Signed)
Patient arrives with complaints of a dry cough with associated nausea. Patient states that she was sitting in front of a window so she thought it was allergies. Patient states the nausea also started with dry heaving, but no vomiting. Patient denies other symptoms. Patient states her legs have also been swelling.

## 2020-08-13 MED FILL — Lisinopril Tab 40 MG: ORAL | 30 days supply | Qty: 30 | Fill #1 | Status: AC

## 2020-08-14 ENCOUNTER — Other Ambulatory Visit: Payer: Self-pay

## 2020-08-15 ENCOUNTER — Other Ambulatory Visit: Payer: Self-pay

## 2020-09-10 ENCOUNTER — Other Ambulatory Visit: Payer: Self-pay

## 2020-09-10 MED FILL — Lisinopril Tab 40 MG: ORAL | 30 days supply | Qty: 30 | Fill #2 | Status: AC

## 2020-09-11 ENCOUNTER — Other Ambulatory Visit: Payer: Self-pay

## 2020-09-16 ENCOUNTER — Inpatient Hospital Stay: Payer: Self-pay | Admitting: Nurse Practitioner

## 2020-09-17 ENCOUNTER — Ambulatory Visit: Payer: Self-pay | Admitting: Family Medicine

## 2020-09-17 ENCOUNTER — Other Ambulatory Visit: Payer: Self-pay

## 2020-09-17 ENCOUNTER — Other Ambulatory Visit: Payer: Self-pay | Admitting: Nurse Practitioner

## 2020-09-17 DIAGNOSIS — Z760 Encounter for issue of repeat prescription: Secondary | ICD-10-CM

## 2020-09-17 DIAGNOSIS — R0602 Shortness of breath: Secondary | ICD-10-CM

## 2020-09-18 ENCOUNTER — Other Ambulatory Visit: Payer: Self-pay

## 2020-09-18 MED ORDER — ALBUTEROL SULFATE HFA 108 (90 BASE) MCG/ACT IN AERS
2.0000 | INHALATION_SPRAY | Freq: Four times a day (QID) | RESPIRATORY_TRACT | 11 refills | Status: DC | PRN
  Filled 2020-09-18: qty 8.5, 25d supply, fill #0

## 2020-09-19 ENCOUNTER — Other Ambulatory Visit: Payer: Self-pay

## 2020-09-20 ENCOUNTER — Other Ambulatory Visit: Payer: Self-pay

## 2020-09-26 ENCOUNTER — Telehealth: Payer: Self-pay

## 2020-09-26 NOTE — Telephone Encounter (Signed)
Please call the pt she has questions about her inhaler.

## 2020-09-27 NOTE — Telephone Encounter (Signed)
Left voice mail to call back 

## 2020-10-02 ENCOUNTER — Other Ambulatory Visit: Payer: Self-pay

## 2020-10-07 ENCOUNTER — Other Ambulatory Visit: Payer: Self-pay

## 2020-10-07 ENCOUNTER — Other Ambulatory Visit: Payer: Self-pay | Admitting: Nurse Practitioner

## 2020-10-08 ENCOUNTER — Other Ambulatory Visit: Payer: Self-pay

## 2020-10-08 MED ORDER — "INSULIN SYRINGE-NEEDLE U-100 31G X 5/16"" 0.5 ML MISC"
0.5000 [IU] | Freq: Every day | 11 refills | Status: DC
  Filled 2020-10-08: qty 100, 90d supply, fill #0
  Filled 2020-11-12 – 2021-01-13 (×2): qty 100, 30d supply, fill #0
  Filled 2021-01-27: qty 100, 25d supply, fill #0
  Filled 2021-06-05: qty 100, 25d supply, fill #1
  Filled 2021-06-06: qty 100, 25d supply, fill #0

## 2020-10-13 MED FILL — Lisinopril Tab 40 MG: ORAL | 30 days supply | Qty: 30 | Fill #3 | Status: AC

## 2020-10-15 ENCOUNTER — Other Ambulatory Visit: Payer: Self-pay

## 2020-10-16 ENCOUNTER — Encounter: Payer: Self-pay | Admitting: Nurse Practitioner

## 2020-10-16 ENCOUNTER — Ambulatory Visit (HOSPITAL_COMMUNITY)
Admission: RE | Admit: 2020-10-16 | Discharge: 2020-10-16 | Disposition: A | Discharge status: Home / Self Care | Payer: Medicaid Other | Source: Ambulatory Visit | Attending: Nurse Practitioner | Admitting: Nurse Practitioner

## 2020-10-16 ENCOUNTER — Ambulatory Visit (INDEPENDENT_AMBULATORY_CARE_PROVIDER_SITE_OTHER): Payer: Medicaid Other | Admitting: Nurse Practitioner

## 2020-10-16 ENCOUNTER — Other Ambulatory Visit: Payer: Self-pay

## 2020-10-16 VITALS — BP 186/71 | HR 86 | Temp 97.4°F | Ht 62.0 in | Wt 204.0 lb

## 2020-10-16 DIAGNOSIS — M25561 Pain in right knee: Secondary | ICD-10-CM | POA: Insufficient documentation

## 2020-10-16 DIAGNOSIS — E118 Type 2 diabetes mellitus with unspecified complications: Secondary | ICD-10-CM

## 2020-10-16 DIAGNOSIS — R82998 Other abnormal findings in urine: Secondary | ICD-10-CM

## 2020-10-16 DIAGNOSIS — G8929 Other chronic pain: Secondary | ICD-10-CM | POA: Insufficient documentation

## 2020-10-16 DIAGNOSIS — I10 Essential (primary) hypertension: Secondary | ICD-10-CM

## 2020-10-16 DIAGNOSIS — Z794 Long term (current) use of insulin: Secondary | ICD-10-CM

## 2020-10-16 LAB — POCT URINALYSIS DIPSTICK
Bilirubin, UA: NEGATIVE
Glucose, UA: NEGATIVE
Ketones, UA: NEGATIVE
Nitrite, UA: NEGATIVE
Protein, UA: NEGATIVE
Spec Grav, UA: 1.015 (ref 1.010–1.025)
Urobilinogen, UA: 0.2 E.U./dL
pH, UA: 6 (ref 5.0–8.0)

## 2020-10-16 LAB — POCT GLYCOSYLATED HEMOGLOBIN (HGB A1C)
HbA1c POC (<> result, manual entry): 7.4 % (ref 4.0–5.6)
HbA1c, POC (controlled diabetic range): 7.4 % — AB (ref 0.0–7.0)
HbA1c, POC (prediabetic range): 7.4 % — AB (ref 5.7–6.4)
Hemoglobin A1C: 7.4 % — AB (ref 4.0–5.6)

## 2020-10-16 NOTE — Patient Instructions (Signed)
Diabetes Mellitus and Nutrition, Adult When you have diabetes, or diabetes mellitus, it is very important to have healthy eating habits because your blood sugar (glucose) levels are greatly affected by what you eat and drink. Eating healthy foods in the right amounts, at about the same times every day, can help you: Control your blood glucose. Lower your risk of heart disease. Improve your blood pressure. Reach or maintain a healthy weight. What can affect my meal plan? Every person with diabetes is different, and each person has different needs for a meal plan. Your health care provider may recommend that you work with a dietitian to make a meal plan that is best for you. Your meal plan may vary depending on factors such as: The calories you need. The medicines you take. Your weight. Your blood glucose, blood pressure, and cholesterol levels. Your activity level. Other health conditions you have, such as heart or kidney disease. How do carbohydrates affect me? Carbohydrates, also called carbs, affect your blood glucose level more than any other type of food. Eating carbs naturally raises the amount of glucose in your blood. Carb counting is a method for keeping track of how many carbs you eat. Counting carbs is important to keep your blood glucose at a healthy level,especially if you use insulin or take certain oral diabetes medicines. It is important to know how many carbs you can safely have in each meal. This is different for every person. Your dietitian can help you calculate how manycarbs you should have at each meal and for each snack. How does alcohol affect me? Alcohol can cause a sudden decrease in blood glucose (hypoglycemia), especially if you use insulin or take certain oral diabetes medicines. Hypoglycemia can be a life-threatening condition. Symptoms of hypoglycemia, such as sleepiness, dizziness, and confusion, are similar to symptoms of having too much alcohol. Do not drink  alcohol if: Your health care provider tells you not to drink. You are pregnant, may be pregnant, or are planning to become pregnant. If you drink alcohol: Do not drink on an empty stomach. Limit how much you use to: 0-1 drink a day for women. 0-2 drinks a day for men. Be aware of how much alcohol is in your drink. In the [place] one drink equals one 12 oz bottle of beer (355 mL), one 5 oz glass of wine (148 mL), or one 1 oz glass of hard liquor (44 mL). Keep yourself hydrated with water, diet soda, or unsweetened iced tea. Keep in mind that regular soda, juice, and other mixers may contain a lot of sugar and must be counted as carbs. What are tips for following this plan?  Reading food labels Start by checking the serving size on the "Nutrition Facts" label of packaged foods and drinks. The amount of calories, carbs, fats, and other nutrients listed on the label is based on one serving of the item. Many items contain more than one serving per package. Check the total grams (g) of carbs in one serving. You can calculate the number of servings of carbs in one serving by dividing the total carbs by 15. For example, if a food has 30 g of total carbs per serving, it would be equal to 2 servings of carbs. Check the number of grams (g) of saturated fats and trans fats in one serving. Choose foods that have a low amount or none of these fats. Check the number of milligrams (mg) of salt (sodium) in one serving. Most people should limit total  sodium intake to less than 2,300 mg per day. Always check the nutrition information of foods labeled as "low-fat" or "nonfat." These foods may be higher in added sugar or refined carbs and should be avoided. Talk to your dietitian to identify your daily goals for nutrients listed on the label. Shopping Avoid buying canned, pre-made, or processed foods. These foods tend to be high in fat, sodium, and added sugar. Shop around the outside edge of the grocery store. This  is where you will most often find fresh fruits and vegetables, bulk grains, fresh meats, and fresh dairy. Cooking Use low-heat cooking methods, such as baking, instead of high-heat cooking methods like deep frying. Cook using healthy oils, such as olive, canola, or sunflower oil. Avoid cooking with butter, cream, or high-fat meats. Meal planning Eat meals and snacks regularly, preferably at the same times every day. Avoid going long periods of time without eating. Eat foods that are high in fiber, such as fresh fruits, vegetables, beans, and whole grains. Talk with your dietitian about how many servings of carbs you can eat at each meal. Eat 4-6 oz (112-168 g) of lean protein each day, such as lean meat, chicken, fish, eggs, or tofu. One ounce (oz) of lean protein is equal to: 1 oz (28 g) of meat, chicken, or fish. 1 egg.  cup (62 g) of tofu. Eat some foods each day that contain healthy fats, such as avocado, nuts, seeds, and fish. What foods should I eat? Fruits Berries. Apples. Oranges. Peaches. Apricots. Plums. Grapes. Mango. Papaya.Pomegranate. Kiwi. Cherries. Vegetables Lettuce. Spinach. Leafy greens, including kale, chard, collard greens, and mustard greens. Beets. Cauliflower. Cabbage. Broccoli. Carrots. Green beans.Tomatoes. Peppers. Onions. Cucumbers. Brussels sprouts. Grains Whole grains, such as whole-wheat or whole-grain bread, crackers, tortillas,cereal, and pasta. Unsweetened oatmeal. Quinoa. Brown or wild rice. Meats and other proteins Seafood. Poultry without skin. Lean cuts of poultry and beef. Tofu. Nuts. Seeds. Dairy Low-fat or fat-free dairy products such as milk, yogurt, and cheese. The items listed above may not be a complete list of foods and beverages you can eat. Contact a dietitian for more information. What foods should I avoid? Fruits Fruits canned with syrup. Vegetables Canned vegetables. Frozen vegetables with butter or cream sauce. Grains Refined white  flour and flour products such as bread, pasta, snack foods, andcereals. Avoid all processed foods. Meats and other proteins Fatty cuts of meat. Poultry with skin. Breaded or fried meats. Processed meat.Avoid saturated fats. Dairy Full-fat yogurt, cheese, or milk. Beverages Sweetened drinks, such as soda or iced tea. The items listed above may not be a complete list of foods and beverages you should avoid. Contact a dietitian for more information. Questions to ask a health care provider Do I need to meet with a diabetes educator? Do I need to meet with a dietitian? What number can I call if I have questions? When are the best times to check my blood glucose? Where to find more information: American Diabetes Association: diabetes.org Academy of Nutrition and Dietetics: www.eatright.[employer] of Diabetes and Digestive and Kidney Diseases: [employer] Association of Diabetes Care and Education Specialists: www.diabeteseducator.org Summary It is important to have healthy eating habits because your blood sugar (glucose) levels are greatly affected by what you eat and drink. A healthy meal plan will help you control your blood glucose and maintain a healthy lifestyle. Your health care provider may recommend that you work with a dietitian to make a meal plan that is best for you. Keep in  mind that carbohydrates (carbs) and alcohol have immediate effects on your blood glucose levels. It is important to count carbs and to use alcohol carefully. This information is not intended to replace advice given to you by your health care provider. Make sure you discuss any questions you have with your healthcare provider. Document Revised: 03/07/2019 Document Reviewed: 03/07/2019 Elsevier Patient Education  2021 Elsevier Inc.  Peripheral Vascular Disease  Peripheral vascular disease (PVD) is a disease of the blood vessels that carry blood from the heart to the rest of the body. PVD is  also called peripheral artery disease (PAD) or poor circulation. PVD affects most of the body. But itaffects the legs and feet the most. PVD can lead to acute limb ischemia. This happens when there is a sudden stopof blood flow to an arm or leg. This is a medical emergency. What are the causes? The most common cause of PVD is a buildup of a fatty substance (plaque) inside your arteries. This decreases blood flow. Plaque can break off andblock blood in a smaller artery. This can lead to acute limb ischemia. Other common causes of PVD include: Blood clots inside the blood vessels. Injuries to blood vessels. Irritation and swelling of blood vessels. Sudden tightening of the blood vessel (spasms). What increases the risk? A family history of PVD. Medical conditions, including: High cholesterol. Diabetes. High blood pressure. Heart disease. Past problems with blood clots. Past injury, such as burns or a broken bone. Other conditions, such as: Buerger's disease. This is caused by swollen or irritated blood vessels in your hands and feet. Arthritis. Birth defects that affect the arteries in your legs. Kidney disease. Using tobacco or nicotine products. Not getting enough exercise. Being very overweight (obese). Being 61 years old or older. What are the signs or symptoms? Cramps in your butt, legs, and feet. Pain and weakness in your legs when you are active that goes away when you rest. Leg pain when at rest. Leg numbness, tingling, or weakness. Coldness in a leg or foot, especially when compared with the other leg or foot. Skin or hair changes. These can include: Hair loss. Shiny skin. Pale or bluish skin. Thick toenails. Being unable to get or keep an erection. Tiredness (fatigue). Weak pulse or no pulse in the feet. Wounds and sores on the toes, feet, or legs. These take longer to heal. How is this treated? Underlying causes are treated first. Other conditions, like diabetes,  high cholesterol, and blood pressure, are also treated. Treatment may include: Lifestyle changes, such as: Quitting smoking. Getting regular exercise. Having a diet low in fat and cholesterol. Not drinking alcohol. Taking medicines, such as: Blood thinners. Medicines to improve blood flow. Medicines to improve your blood cholesterol. Procedures to: Open the arteries and restore blood flow. Insert a small mesh tube (stent) to keep a blocked vessel open. Create a new path for blood to flow to the body (peripheral bypass). Remove dead tissue from a wound. Remove an affected leg or arm. Follow these instructions at home: Medicines Take over-the-counter and prescription medicines only as told by your doctor. If you are taking blood thinners: Talk with your doctor before you take any medicines that have aspirin, or NSAIDs, such as ibuprofen. Take medicines exactly as told. Take them at the same time each day. Avoid doing things that could hurt or bruise you. Take action to prevent falls. Wear an alert bracelet or carry a card that shows you are taking blood thinners. Lifestyle  Get regular exercise. Ask your doctor about how to stay active. Talk with your doctor about keeping a healthy weight. If needed, ask about losing weight. Eat a diet that is low in fat and cholesterol. If you need help, talk with your doctor. Do not drink alcohol. Do not smoke or use any products that contain nicotine or tobacco. If you need help quitting, ask your doctor. General instructions Take good care of your feet. To do this: Wear shoes that fit well and feel good. Check your feet often for any cuts or sores. Get a flu shot (influenza vaccine) each year. Keep all follow-up visits. Where to find more information Society for Vascular Surgery: vascular.org American Heart Association: heart.org National Heart, Lung, and Blood Institute: [employer].gl Contact a doctor if: You have cramps in your legs  when you walk. You have leg pain when you rest. Your leg or foot feels cold. Your skin changes. You cannot get or keep an erection. You have cuts or sores on your legs or feet that do not heal. Get help right away if: You have sudden changes in the color and feeling of your arms or legs, such as: Your arm or leg turns cold, numb, and blue. Your arm or leg becomes red, warm, swollen, painful, or numb. You have any signs of a stroke. "BE FAST" is an easy way to remember the main warning signs: B - Balance. Dizziness, sudden trouble walking, or loss of balance. E - Eyes. Trouble seeing or a change in how you see. F - Face. Sudden weakness or loss of feeling of the face. The face or eyelid may droop on one side. A - Arms. Weakness or loss of feeling in an arm. This happens all of a sudden and most often on one side of the body. S - Speech. Sudden trouble speaking, slurred speech, or trouble understanding what people say. T - Time. Time to call emergency services. Write down what time symptoms started. You have other signs of a stroke, such as: A sudden, very bad headache with no known cause. Feeling like you may vomit (nausea). Vomiting. A seizure. You have chest pain or trouble breathing. These symptoms may be an emergency. Get help right away. Call your local emergency services (911 in the [place] Do not wait to see if the symptoms will go away. Do not drive yourself to the hospital. Summary Peripheral vascular disease (PVD) is a disease of the blood vessels. PVD affects the legs and feet the most. Symptoms may include leg pain or leg numbness, tingling, and weakness. Treatment may include lifestyle changes, medicines, and procedures. This information is not intended to replace advice given to you by your health care provider. Make sure you discuss any questions you have with your healthcare provider. Document Revised: 10/02/2019 Document Reviewed: 10/02/2019 Elsevier Patient Education   2022 [employer].

## 2020-10-16 NOTE — Progress Notes (Signed)
[facility] [address] [place], [place]  [address] Phone:  [phone]   Fax:  [phone]   Established Patient Office Visit  Subjective:  Patient ID: Wanda Weaver, female    DOB: 12-06-1960  Age: 60 y.o. MRN: [id number]  CC:  Chief Complaint  Patient presents with   Follow-up    3 month follow up; used inhaler once felt like she could not catch her breath so she stopped using.    Knee Pain    Right     HPI\\ Wanda Weaver presents for follow up. A former patient of NP Stroud.  She  has a past medical history of Bilateral lower extremity edema (07/2019), Bilateral lower extremity pain (07/2019), Diabetes mellitus without complication (HCC), Hemoglobin A1C greater than 9%, indicating poor diabetic control, Hyperlipidemia, Hypertension, and Vitamin D deficiency.   Hypertension Patient is here for follow-up of elevated blood pressure. She is exercising and is not adherent to a low-salt diet. Blood pressure is well controlled at home. Cardiac symptoms: lower extremity edema. Patient denies chest pain, chest pressure/discomfort, dyspnea, exertional chest pressure/discomfort, fatigue, irregular heart beat, orthopnea, palpitations, paroxysmal nocturnal dyspnea, syncope, and tachypnea. Cardiovascular risk factors: diabetes mellitus, dyslipidemia, hypertension, and obesity (BMI >= 30 kg/m2). Use of agents associated with hypertension: none. History of target organ damage: none.  She has noticed a right knee pain for 3 month. She reports like a "Charlie horse that is not being relief". She feels like it gives out and is stiff. She has to climb 15 steps to get to her bedroom.  She has had a Doppler study in the past which was negative  Diabetes Mellitus Patient presents for follow up of diabetes. Current symptoms include: hyperglycemia. Symptoms have stabilized. Patient denies foot ulcerations, nausea, and vomiting. Evaluation to date has included: hemoglobin A1C.  Home sugars:  BGs have been labile ranging between 76 and 436 . Current treatment: Continued insulin which has been somewhat effective, Continued metformin which has been somewhat effective, and Continued statin which has been unable to assess effectiveness. Last dilated eye exam: Apt to be made    Past Medical History:  Diagnosis Date   Bilateral lower extremity edema 07/2019   Bilateral lower extremity pain 07/2019   Diabetes mellitus without complication (HCC)    Hemoglobin A1C greater than 9%, indicating poor diabetic control    Hyperlipidemia    Hypertension    Vitamin D deficiency     Past Surgical History:  Procedure Laterality Date   APPENDECTOMY     CARPAL TUNNEL RELEASE Bilateral    CHOLECYSTECTOMY     WRIST SURGERY Bilateral     Family History  Problem Relation Age of Onset   Diabetes Mother    Dementia Mother    Diabetes Father    Hypertension Father     Social History   Socioeconomic History   Marital status: Single    Spouse name: Not on file   Number of children: Not on file   Years of education: Not on file   Highest education level: Not on file  Occupational History   Not on file  Tobacco Use   Smoking status: Never   Smokeless tobacco: Never  Vaping Use   Vaping Use: Never used  Substance and Sexual Activity   Alcohol use: No   Drug use: No   Sexual activity: Yes    Partners: Male  Other Topics Concern   Not on file  Social History Narrative  Not on file   Social Determinants of Health   Financial Resource Strain: Not on file  Food Insecurity: Not on file  Transportation Needs: Not on file  Physical Activity: Not on file  Stress: Not on file  Social Connections: Not on file  Intimate Partner Violence: Not on file    Outpatient Medications Prior to Visit  Medication Sig Dispense Refill   blood glucose meter kit and supplies KIT Dispense based on patient and insurance preference. 100 each 12   glucose blood (ACCU-CHEK AVIVA) test strip Use as  instructed 300 each 3   glucose blood (TRUE METRIX BLOOD GLUCOSE TEST) test strip Use as instructed 300 each 3   ibuprofen (ADVIL) 800 MG tablet Take 1 tablet (800 mg total) by mouth every 8 (eight) hours as needed. 60 tablet 6   insulin glargine (LANTUS) 100 UNIT/ML injection Inject 0.5 mLs (50 Units total) into the skin daily. 10 mL 11   Insulin Pen Needle (BD ULTRA-FINE PEN NEEDLES) 29G X 12.7MM MISC ICD10 E11.9 for use with insulin administration 200 each 2   Insulin Syringe-Needle U-100 (BD INSULIN SYRINGE ULTRAFINE) 31G X 5/16" 0.5 ML MISC use to inject 50 units of insulin daily 10 each 11   Lancets 30G MISC 1 each by Does not apply route as directed. 100 each 12   lisinopril (ZESTRIL) 40 MG tablet TAKE 1 TABLET (40 MG TOTAL) BY MOUTH DAILY. 90 tablet 3   metFORMIN (GLUCOPHAGE) 500 MG tablet Take 2 tablets (1,000 mg total) by mouth 2 (two) times daily with a meal. 120 tablet 11   Multiple Vitamin (MULTIVITAMIN) tablet Take 1 tablet by mouth daily.     albuterol (VENTOLIN HFA) 108 (90 Base) MCG/ACT inhaler Inhale 2 puffs into the lungs every 6 (six) hours as needed for wheezing or shortness of breath. 8.5 g 11   atorvastatin (LIPITOR) 10 MG tablet Take 1 tablet (10 mg total) by mouth daily. 90 tablet 3   busPIRone (BUSPAR) 10 MG tablet Take 1 tablet (10 mg total) by mouth 3 (three) times daily. 90 tablet 3   doxycycline (VIBRA-TABS) 100 MG tablet Take 1 tablet (100 mg total) by mouth 2 (two) times daily. 14 tablet 0   furosemide (LASIX) 20 MG tablet Take 1 every day if needed for swelling in your legs 30 tablet 1   glipiZIDE (GLUCOTROL) 10 MG tablet Take 1 tablet (10 mg total) by mouth 2 (two) times daily before a meal. 180 tablet 3   Vitamin D, Ergocalciferol, (DRISDOL) 1.25 MG (50000 UNIT) CAPS capsule Take 1 capsule (50,000 Units total) by mouth every 7 (seven) days. 5 capsule 6   No facility-administered medications prior to visit.    No Known Allergies  ROS Review of Systems     Objective:    Physical Exam Constitutional:      Appearance: She is obese.  HENT:     Head: Normocephalic and atraumatic.  Cardiovascular:     Rate and Rhythm: Normal rate and regular rhythm.     Pulses: Normal pulses.          Dorsalis pedis pulses are 2+ on the right side and 2+ on the left side.       Posterior tibial pulses are 2+ on the right side and 2+ on the left side.     Heart sounds: Normal heart sounds.  Pulmonary:     Effort: Pulmonary effort is normal.     Breath sounds: Normal breath sounds.  Abdominal:  General: Abdomen is flat. Bowel sounds are normal.     Palpations: Abdomen is soft.  Musculoskeletal:        General: Tenderness (knee) present.     Cervical back: Normal range of motion.     Right lower leg: No edema.     Left lower leg: No edema.  Feet:     Right foot:     Protective Sensation: 10 sites tested.  10 sites sensed.     Toenail Condition: Right toenails are abnormally thick and long.     Left foot:     Protective Sensation: 10 sites tested.  10 sites sensed.     Toenail Condition: Left toenails are abnormally thick and long.  Skin:    General: Skin is warm.     Capillary Refill: Capillary refill takes less than 2 seconds.  Neurological:     General: No focal deficit present.     Mental Status: She is alert and oriented to person, place, and time.  Psychiatric:        Mood and Affect: Mood normal.        Behavior: Behavior normal.        Thought Content: Thought content normal.        Judgment: Judgment normal.    BP (!) 186/71   Pulse 86   Temp (!) 97.4 F (36.3 C)   Ht $R'5\' 2"'bt$  (1.575 m)   Wt 204 lb 0.6 oz (92.6 kg)   SpO2 99%   BMI 37.32 kg/m  Wt Readings from Last 3 Encounters:  10/16/20 204 lb 0.6 oz (92.6 kg)  08/12/20 190 lb (86.2 kg)  07/17/20 202 lb (91.6 kg)     Health Maintenance Due  Topic Date Due   MAMMOGRAM  Never done   PAP SMEAR-Modifier  09/17/2020    There are no preventive care reminders to display  for this patient.  Lab Results  Component Value Date   TSH 1.090 07/17/2020   Lab Results  Component Value Date   WBC 4.7 08/12/2020   HGB 12.0 08/12/2020   HCT 36.6 08/12/2020   MCV 85.7 08/12/2020   PLT 193 08/12/2020   Lab Results  Component Value Date   NA 142 08/12/2020   K 4.0 08/12/2020   CO2 24 08/12/2020   GLUCOSE 116 (H) 08/12/2020   BUN 11 08/12/2020   CREATININE 0.76 08/12/2020   BILITOT 0.5 08/12/2020   ALKPHOS 62 08/12/2020   AST 49 (H) 08/12/2020   ALT 47 (H) 08/12/2020   PROT 6.7 08/12/2020   ALBUMIN 3.7 08/12/2020   CALCIUM 9.1 08/12/2020   ANIONGAP 9 08/12/2020   EGFR 84 07/17/2020   Lab Results  Component Value Date   CHOL 275 (H) 07/17/2020   Lab Results  Component Value Date   HDL 64 07/17/2020   Lab Results  Component Value Date   LDLCALC 191 (H) 07/17/2020   Lab Results  Component Value Date   TRIG 113 07/17/2020   Lab Results  Component Value Date   CHOLHDL 4.3 07/17/2020   Lab Results  Component Value Date   HGBA1C 7.4 (A) 10/16/2020   HGBA1C 7.4 10/16/2020   HGBA1C 7.4 (A) 10/16/2020   HGBA1C 7.4 (A) 10/16/2020      Assessment & Plan:   Problem List Items Addressed This Visit       Cardiovascular and Mediastinum   Essential hypertension Encouraged on going compliance with current medication regimen Encouraged home monitoring and recording BP <130/80 Eating  a heart-healthy diet with less salt Encouraged regular physical activity  Recommend Weight loss     Relevant Orders   Urinalysis Dipstick (Completed)     Endocrine   Type 2 diabetes mellitus with complication, with long-term current use of insulin (HCC) - Primary Stable and improving hemoglobin A1c 7.4 down from 8.5 Encourage compliance with current treatment regimen  Encourage regular CBG monitoring Encourage contacting office if excessive hyperglycemia and or hypoglycemia Lifestyle modification with healthy diet (fewer calories, more high fiber foods,  whole grains and non-starchy vegetables, lower fat meat and fish, low-fat diary include healthy oils) regular exercise (physical activity) and weight loss Opthalmology exam discussed  Nutritional consult recommended Regular dental visits encouraged Home BP monitoring also encouraged goal <130/80    Relevant Orders   HgB A1c (Completed)   Urinalysis Dipstick (Completed)   Ambulatory referral to Podiatry   Other Visit Diagnoses     Urine white blood cells increased       Relevant Orders   Urine Culture (Completed)   Chronic pain of right knee     Further evaluation with imaging   Relevant Orders   DG Knee Complete 4 Views Right (Completed)       No orders of the defined types were placed in this encounter.   Follow-up: Return in about 3 months (around 01/16/2021).    [name], NP

## 2020-10-17 ENCOUNTER — Telehealth: Payer: Self-pay

## 2020-10-17 NOTE — Telephone Encounter (Signed)
X ray results 

## 2020-10-17 NOTE — Telephone Encounter (Signed)
Advised patient results not in yet. Will call once received

## 2020-10-19 LAB — URINE CULTURE

## 2020-11-12 ENCOUNTER — Other Ambulatory Visit: Payer: Self-pay

## 2020-11-12 MED FILL — Lisinopril Tab 40 MG: ORAL | 30 days supply | Qty: 30 | Fill #4 | Status: AC

## 2020-11-14 ENCOUNTER — Other Ambulatory Visit: Payer: Self-pay

## 2020-11-15 ENCOUNTER — Other Ambulatory Visit: Payer: Self-pay

## 2020-12-11 MED FILL — Lisinopril Tab 40 MG: ORAL | 30 days supply | Qty: 30 | Fill #5 | Status: AC

## 2020-12-12 ENCOUNTER — Other Ambulatory Visit: Payer: Self-pay

## 2020-12-13 ENCOUNTER — Other Ambulatory Visit: Payer: Self-pay

## 2020-12-24 ENCOUNTER — Other Ambulatory Visit: Payer: Self-pay

## 2021-01-08 ENCOUNTER — Other Ambulatory Visit: Payer: Self-pay

## 2021-01-08 MED FILL — Lisinopril Tab 40 MG: ORAL | 30 days supply | Qty: 30 | Fill #6 | Status: AC

## 2021-01-09 ENCOUNTER — Other Ambulatory Visit: Payer: Self-pay

## 2021-01-13 ENCOUNTER — Other Ambulatory Visit: Payer: Self-pay

## 2021-01-16 ENCOUNTER — Ambulatory Visit (INDEPENDENT_AMBULATORY_CARE_PROVIDER_SITE_OTHER): Payer: Self-pay | Admitting: Nurse Practitioner

## 2021-01-16 ENCOUNTER — Other Ambulatory Visit: Payer: Self-pay

## 2021-01-16 ENCOUNTER — Encounter: Payer: Self-pay | Admitting: Nurse Practitioner

## 2021-01-16 VITALS — BP 170/73 | HR 98 | Temp 98.2°F | Ht 62.0 in | Wt 206.0 lb

## 2021-01-16 DIAGNOSIS — I10 Essential (primary) hypertension: Secondary | ICD-10-CM

## 2021-01-16 DIAGNOSIS — E118 Type 2 diabetes mellitus with unspecified complications: Secondary | ICD-10-CM

## 2021-01-16 DIAGNOSIS — Z1321 Encounter for screening for nutritional disorder: Secondary | ICD-10-CM

## 2021-01-16 DIAGNOSIS — R82998 Other abnormal findings in urine: Secondary | ICD-10-CM

## 2021-01-16 DIAGNOSIS — Z760 Encounter for issue of repeat prescription: Secondary | ICD-10-CM

## 2021-01-16 DIAGNOSIS — Z794 Long term (current) use of insulin: Secondary | ICD-10-CM

## 2021-01-16 DIAGNOSIS — M25561 Pain in right knee: Secondary | ICD-10-CM

## 2021-01-16 DIAGNOSIS — G8929 Other chronic pain: Secondary | ICD-10-CM

## 2021-01-16 DIAGNOSIS — E785 Hyperlipidemia, unspecified: Secondary | ICD-10-CM

## 2021-01-16 DIAGNOSIS — R252 Cramp and spasm: Secondary | ICD-10-CM

## 2021-01-16 LAB — POCT URINALYSIS DIP (CLINITEK)
Bilirubin, UA: NEGATIVE
Blood, UA: NEGATIVE
Glucose, UA: NEGATIVE mg/dL
Ketones, POC UA: NEGATIVE mg/dL
Nitrite, UA: NEGATIVE
POC PROTEIN,UA: NEGATIVE
Spec Grav, UA: 1.015 (ref 1.010–1.025)
Urobilinogen, UA: 0.2 E.U./dL
pH, UA: 5.5 (ref 5.0–8.0)

## 2021-01-16 LAB — POCT GLYCOSYLATED HEMOGLOBIN (HGB A1C)
HbA1c POC (<> result, manual entry): 7.7 % (ref 4.0–5.6)
HbA1c, POC (controlled diabetic range): 7.7 % — AB (ref 0.0–7.0)
HbA1c, POC (prediabetic range): 7.7 % — AB (ref 5.7–6.4)
Hemoglobin A1C: 7.7 % — AB (ref 4.0–5.6)

## 2021-01-16 MED ORDER — INSULIN GLARGINE 100 UNIT/ML ~~LOC~~ SOLN
50.0000 [IU] | Freq: Every day | SUBCUTANEOUS | 3 refills | Status: DC
  Filled 2021-01-16: qty 45, 90d supply, fill #0
  Filled 2021-01-27: qty 10, 20d supply, fill #0
  Filled 2021-02-18: qty 10, 20d supply, fill #1
  Filled 2021-03-08: qty 10, 20d supply, fill #2
  Filled 2021-03-27: qty 10, 20d supply, fill #3
  Filled 2021-04-16: qty 10, 20d supply, fill #4
  Filled 2021-04-17: qty 10, 20d supply, fill #0

## 2021-01-16 MED ORDER — LISINOPRIL 40 MG PO TABS
ORAL_TABLET | Freq: Every day | ORAL | 3 refills | Status: DC
  Filled 2021-01-16: qty 90, fill #0
  Filled 2021-02-07: qty 30, 30d supply, fill #0
  Filled 2021-03-12: qty 30, 30d supply, fill #1
  Filled 2021-04-10: qty 30, 30d supply, fill #2
  Filled 2021-05-06: qty 30, 30d supply, fill #3
  Filled 2021-05-06: qty 30, 30d supply, fill #0

## 2021-01-16 NOTE — Patient Instructions (Addendum)
Acute Knee Pain, Adult Many things can cause knee pain. Sometimes, knee pain is sudden (acute) and may be caused by damage, swelling, or irritation of the muscles and tissues that support your knee. The pain often goes away on its own with time and rest. If the pain does not go away, tests may be done to find out what is causing the pain. Follow these instructions at home: If you have a knee sleeve or brace:  Wear the knee sleeve or brace as told by your doctor. Take it off only as told by your doctor. Loosen it if your toes: Tingle. Become numb. Turn cold and blue. Keep it clean. If the knee sleeve or brace is not waterproof: Do not let it get wet. Cover it with a watertight covering when you take a bath or shower. Activity Rest your knee. Do not do things that cause pain or make pain worse. Avoid activities where both feet leave the ground at the same time (high-impact activities). Examples are running, jumping rope, and doing jumping jacks. Work with a physical therapist to make a safe exercise program, as told by your doctor. Managing pain, stiffness, and swelling  If told, put ice on the knee. To do this: If you have a removable knee sleeve or brace, take it off as told by your doctor. Put ice in a plastic bag. Place a towel between your skin and the bag. Leave the ice on for 20 minutes, 2-3 times a day. Take off the ice if your skin turns bright red. This is very important. If you cannot feel pain, heat, or cold, you have a greater risk of damage to the area. If told, use an elastic bandage to put pressure (compression) on your injured knee. Raise your knee above the level of your heart while you are sitting or lying down. Sleep with a pillow under your knee. General instructions Take over-the-counter and prescription medicines only as told by your doctor. Do not smoke or use any products that contain nicotine or tobacco. If you need help quitting, ask your doctor. If you are  overweight, work with your doctor and a food expert (dietitian) to set goals to lose weight. Being overweight can make your knee hurt more. Watch for any changes in your symptoms. Keep all follow-up visits. Contact a doctor if: The knee pain does not stop. The knee pain changes or gets worse. You have a fever along with knee pain. Your knee is red or feels warm when you touch it. Your knee gives out or locks up. Get help right away if: Your knee swells, and the swelling gets worse. You cannot move your knee. You have very bad knee pain that does not get better with pain medicine. Summary Many things can cause knee pain. The pain often goes away on its own with time and rest. Your doctor may do tests to find out the cause of the pain. Watch for any changes in your symptoms. Relieve your pain with rest, medicines, light activity, and use of ice. Get help right away if you cannot move your knee or your knee pain is very bad. This information is not intended to replace advice given to you by your health care provider. Make sure you discuss any questions you have with your health care provider. Document Revised: 09/13/2019 Document Reviewed: 09/13/2019 Elsevier Patient Education  2022 [employer]  Hypertension, Adult Hypertension is another name for high blood pressure. High blood pressure forces your heart to work  harder to pump blood. This can cause problems over time. There are two numbers in a blood pressure reading. There is a top number (systolic) over a bottom number (diastolic). It is best to have a blood pressure that is below 120/80. Healthy choices can help lower your blood pressure, or you may need medicine to help lower it. What are the causes? The cause of this condition is not known. Some conditions may be related to high blood pressure. What increases the risk? Smoking. Having type 2 diabetes mellitus, high cholesterol, or both. Not getting enough exercise or physical  activity. Being overweight. Having too much fat, sugar, calories, or salt (sodium) in your diet. Drinking too much alcohol. Having long-term (chronic) kidney disease. Having a family history of high blood pressure. Age. Risk increases with age. Race. You may be at higher risk if you are African American. Gender. Men are at higher risk than women before age 47. After age 52, women are at higher risk than men. Having obstructive sleep apnea. Stress. What are the signs or symptoms? High blood pressure may not cause symptoms. Very high blood pressure (hypertensive crisis) may cause: Headache. Feelings of worry or nervousness (anxiety). Shortness of breath. Nosebleed. A feeling of being sick to your stomach (nausea). Throwing up (vomiting). Changes in how you see. Very bad chest pain. Seizures. How is this treated? This condition is treated by making healthy lifestyle changes, such as: Eating healthy foods. Exercising more. Drinking less alcohol. Your health care provider may prescribe medicine if lifestyle changes are not enough to get your blood pressure under control, and if: Your top number is above 130. Your bottom number is above 80. Your personal target blood pressure may vary. Follow these instructions at home: Eating and drinking  If told, follow the DASH eating plan. To follow this plan: Fill one half of your plate at each meal with fruits and vegetables. Fill one fourth of your plate at each meal with whole grains. Whole grains include whole-wheat pasta, brown rice, and whole-grain bread. Eat or drink low-fat dairy products, such as skim milk or low-fat yogurt. Fill one fourth of your plate at each meal with low-fat (lean) proteins. Low-fat proteins include fish, chicken without skin, eggs, beans, and tofu. Avoid fatty meat, cured and processed meat, or chicken with skin. Avoid pre-made or processed food. Eat less than 1,500 mg of salt each day. Do not drink alcohol  if: Your doctor tells you not to drink. You are pregnant, may be pregnant, or are planning to become pregnant. If you drink alcohol: Limit how much you use to: 0-1 drink a day for women. 0-2 drinks a day for men. Be aware of how much alcohol is in your drink. In the [place] one drink equals one 12 oz bottle of beer (355 mL), one 5 oz glass of wine (148 mL), or one 1 oz glass of hard liquor (44 mL). Lifestyle  Work with your doctor to stay at a healthy weight or to lose weight. Ask your doctor what the best weight is for you. Get at least 30 minutes of exercise most days of the week. This may include walking, swimming, or biking. Get at least 30 minutes of exercise that strengthens your muscles (resistance exercise) at least 3 days a week. This may include lifting weights or doing Pilates. Do not use any products that contain nicotine or tobacco, such as cigarettes, e-cigarettes, and chewing tobacco. If you need help quitting, ask your doctor. Check your blood  pressure at home as told by your doctor. Keep all follow-up visits as told by your doctor. This is important. Medicines Take over-the-counter and prescription medicines only as told by your doctor. Follow directions carefully. Do not skip doses of blood pressure medicine. The medicine does not work as well if you skip doses. Skipping doses also puts you at risk for problems. Ask your doctor about side effects or reactions to medicines that you should watch for. Contact a doctor if you: Think you are having a reaction to the medicine you are taking. Have headaches that keep coming back (recurring). Feel dizzy. Have swelling in your ankles. Have trouble with your vision. Get help right away if you: Get a very bad headache. Start to feel mixed up (confused). Feel weak or numb. Feel faint. Have very bad pain in your: Chest. Belly (abdomen). Throw up more than once. Have trouble breathing. Summary Hypertension is another name for  high blood pressure. High blood pressure forces your heart to work harder to pump blood. For most people, a normal blood pressure is less than 120/80. Making healthy choices can help lower blood pressure. If your blood pressure does not get lower with healthy choices, you may need to take medicine. This information is not intended to replace advice given to you by your health care provider. Make sure you discuss any questions you have with your health care provider. Document Revised: 12/08/2017 Document Reviewed: 12/08/2017 Elsevier Patient Education  2022 [employer] Knee Exercises Ask your health care provider which exercises are safe for you. Do exercises exactly as told by your health care provider and adjust them as directed. It is normal to feel mild stretching, pulling, tightness, or discomfort as you do these exercises. Stop right away if you feel sudden pain or your pain gets worse. Do not begin these exercises until told by your health care provider. Stretching and range-of-motion exercises These exercises warm up your muscles and joints and improve the movement and flexibility of your knee. These exercises also help to relieve pain and swelling. Knee extension, prone Lie on your abdomen (prone position) on a bed. Place your left / right knee just beyond the edge of the surface so your knee is not on the bed. You can put a towel under your left / right thigh just above your kneecap for comfort. Relax your leg muscles and allow gravity to straighten your knee (extension). You should feel a stretch behind your left / right knee. Hold this position for __________ seconds. Scoot up so your knee is supported between repetitions. Repeat __________ times. Complete this exercise __________ times a day. Knee flexion, active  Lie on your back with both legs straight. If this causes back discomfort, bend your left / right knee so your foot is flat on the floor. Slowly slide your left / right  heel back toward your buttocks. Stop when you feel a gentle stretch in the front of your knee or thigh (flexion). Hold this position for __________ seconds. Slowly slide your left / right heel back to the starting position. Repeat __________ times. Complete this exercise __________ times a day. Quadriceps stretch, prone  Lie on your abdomen on a firm surface, such as a bed or padded floor. Bend your left / right knee and hold your ankle. If you cannot reach your ankle or pant leg, loop a belt around your foot and grab the belt instead. Gently pull your heel toward your buttocks. Your knee should not slide out  to the side. You should feel a stretch in the front of your thigh and knee (quadriceps). Hold this position for __________ seconds. Repeat __________ times. Complete this exercise __________ times a day. Hamstring, supine Lie on your back (supine position). Loop a belt or towel over the ball of your left / right foot. The ball of your foot is on the walking surface, right under your toes. Straighten your left / right knee and slowly pull on the belt to raise your leg until you feel a gentle stretch behind your knee (hamstring). Do not let your knee bend while you do this. Keep your other leg flat on the floor. Hold this position for __________ seconds. Repeat __________ times. Complete this exercise __________ times a day. Strengthening exercises These exercises build strength and endurance in your knee. Endurance is the ability to use your muscles for a long time, even after they get tired. Quadriceps, isometric This exercise stretches the muscles in front of your thigh (quadriceps) without moving your knee joint (isometric). Lie on your back with your left / right leg extended and your other knee bent. Put a rolled towel or small pillow under your knee if told by your health care provider. Slowly tense the muscles in the front of your left / right thigh. You should see your kneecap  slide up toward your hip or see increased dimpling just above the knee. This motion will push the back of the knee toward the floor. For __________ seconds, hold the muscle as tight as you can without increasing your pain. Relax the muscles slowly and completely. Repeat __________ times. Complete this exercise __________ times a day. Straight leg raises This exercise stretches the muscles in front of your thigh (quadriceps) and the muscles that move your hips (hip flexors). Lie on your back with your left / right leg extended and your other knee bent. Tense the muscles in the front of your left / right thigh. You should see your kneecap slide up or see increased dimpling just above the knee. Your thigh may even shake a bit. Keep these muscles tight as you raise your leg 4-6 inches (10-15 cm) off the floor. Do not let your knee bend. Hold this position for __________ seconds. Keep these muscles tense as you lower your leg. Relax your muscles slowly and completely after each repetition. Repeat __________ times. Complete this exercise __________ times a day. Hamstring, isometric Lie on your back on a firm surface. Bend your left / right knee about __________ degrees. Dig your left / right heel into the surface as if you are trying to pull it toward your buttocks. Tighten the muscles in the back of your thighs (hamstring) to "dig" as hard as you can without increasing any pain. Hold this position for __________ seconds. Release the tension gradually and allow your muscles to relax completely for __________ seconds after each repetition. Repeat __________ times. Complete this exercise __________ times a day. Hamstring curls If told by your health care provider, do this exercise while wearing ankle weights. Begin with __________ lb weights. Then increase the weight by 1 lb (0.5 kg) increments. Do not wear ankle weights that are more than __________ lb. Lie on your abdomen with your legs  straight. Bend your left / right knee as far as you can without feeling pain. Keep your hips flat against the floor. Hold this position for __________ seconds. Slowly lower your leg to the starting position. Repeat __________ times. Complete this exercise __________ times  a day. Squats This exercise strengthens the muscles in front of your thigh and knee (quadriceps). Stand in front of a table, with your feet and knees pointing straight ahead. You may rest your hands on the table for balance but not for support. Slowly bend your knees and lower your hips like you are going to sit in a chair. Keep your weight over your heels, not over your toes. Keep your lower legs upright so they are parallel with the table legs. Do not let your hips go lower than your knees. Do not bend lower than told by your health care provider. If your knee pain increases, do not bend as low. Hold the squat position for __________ seconds. Slowly push with your legs to return to standing. Do not use your hands to pull yourself to standing. Repeat __________ times. Complete this exercise __________ times a day. Wall slides This exercise strengthens the muscles in front of your thigh and knee (quadriceps). Lean your back against a smooth wall or door, and walk your feet out 18-24 inches (46-61 cm) from it. Place your feet hip-width apart. Slowly slide down the wall or door until your knees bend __________ degrees. Keep your knees over your heels, not over your toes. Keep your knees in line with your hips. Hold this position for __________ seconds. Repeat __________ times. Complete this exercise __________ times a day. Straight leg raises This exercise strengthens the muscles that rotate the leg at the hip and move it away from your body (hip abductors). Lie on your side with your left / right leg in the top position. Lie so your head, shoulder, knee, and hip line up. You may bend your bottom knee to help you keep your  balance. Roll your hips slightly forward so your hips are stacked directly over each other and your left / right knee is facing forward. Leading with your heel, lift your top leg 4-6 inches (10-15 cm). You should feel the muscles in your outer hip lifting. Do not let your foot drift forward. Do not let your knee roll toward the ceiling. Hold this position for __________ seconds. Slowly return your leg to the starting position. Let your muscles relax completely after each repetition. Repeat __________ times. Complete this exercise __________ times a day. Straight leg raises This exercise stretches the muscles that move your hips away from the front of the pelvis (hip extensors). Lie on your abdomen on a firm surface. You can put a pillow under your hips if that is more comfortable. Tense the muscles in your buttocks and lift your left / right leg about 4-6 inches (10-15 cm). Keep your knee straight as you lift your leg. Hold this position for __________ seconds. Slowly lower your leg to the starting position. Let your leg relax completely after each repetition. Repeat __________ times. Complete this exercise __________ times a day. This information is not intended to replace advice given to you by your health care provider. Make sure you discuss any questions you have with your health care provider. Document Revised: 01/18/2018 Document Reviewed: 01/18/2018 [employer] Patient Education  2022 [employer].

## 2021-01-16 NOTE — Progress Notes (Signed)
[address], [place]  [address] Phone:  [phone]   Fax:  [phone]   Established Patient Office Visit  Subjective:  Patient ID: Wanda Weaver, female    DOB: 1960-09-12  Age: 60 y.o. MRN: [id number]  CC:  Chief Complaint  Patient presents with   Diabetes    3 month follow up;Diabetes Pt has been having right knee pains x 2 to 3 months. Pt has been wearing a knee brace but she is still having pain.    HPI Wanda Weaver presents for follow up. She  has a past medical history of Bilateral lower extremity edema (07/2019), Bilateral lower extremity pain (07/2019), Diabetes mellitus without complication ([place]), Hemoglobin A1C greater than 9%, indicating poor diabetic control, Hyperlipidemia, Hypertension, and Vitamin D deficiency.   he does check her BP at home 157/70. She feels like her BP is elevated due to her fear of coming to the doctor. She is compliant with her medication. She deals with anxiety. She reports that her first husband died after one month of marriage from cancer.   She reports a CBG 27 on 5/16 ;she felt fine. She was able to call her children and take a picture. Her highest was 300. She eats banana for breakfast . She reports that her diet is not optimal. She does continue to take her medication without eating. She is using the Lantus 50 units and metformin 1000 mg bid.   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   Past Medical History:  Diagnosis Date   Bilateral lower extremity edema 07/2019   Bilateral lower extremity pain 07/2019   Diabetes mellitus without complication (HCC)    Hemoglobin A1C greater than 9%, indicating poor diabetic control    Hyperlipidemia    Hypertension    Vitamin D deficiency     Past Surgical History:  Procedure Laterality Date   APPENDECTOMY     CARPAL TUNNEL RELEASE Bilateral    CHOLECYSTECTOMY     WRIST SURGERY Bilateral     Family  History  Problem Relation Age of Onset   Diabetes Mother    Dementia Mother    Diabetes Father    Hypertension Father     Social History   Socioeconomic History   Marital status: Single    Spouse name: Not on file   Number of children: Not on file   Years of education: Not on file   Highest education level: Not on file  Occupational History   Not on file  Tobacco Use   Smoking status: Never   Smokeless tobacco: Never  Vaping Use   Vaping Use: Never used  Substance and Sexual Activity   Alcohol use: No   Drug use: No   Sexual activity: Yes    Partners: Male  Other Topics Concern   Not on file  Social History Narrative   Not on file   Social Determinants of Health   Financial Resource Strain: Not on file  Food Insecurity: Not on file  Transportation Needs: Not on file  Physical Activity: Not on file  Stress: Not on file  Social Connections: Not on file  Intimate Partner Violence: Not on file    Outpatient Medications Prior to Visit  Medication Sig Dispense Refill   blood glucose meter kit and supplies KIT Dispense based on patient and insurance preference. 100 each 12   glucose blood (  ACCU-CHEK AVIVA) test strip Use as instructed 300 each 3   glucose blood (TRUE METRIX BLOOD GLUCOSE TEST) test strip Use as instructed 300 each 3   ibuprofen (ADVIL) 800 MG tablet Take 1 tablet (800 mg total) by mouth every 8 (eight) hours as needed. 60 tablet 6   Insulin Pen Needle (BD ULTRA-FINE PEN NEEDLES) 29G X 12.7MM MISC ICD10 E11.9 for use with insulin administration 200 each 2   Insulin Syringe-Needle U-100 (BD INSULIN SYRINGE ULTRAFINE) 31G X 5/16" 0.5 ML MISC use to inject 50 units of insulin daily 100 each 11   Lancets 30G MISC 1 each by Does not apply route as directed. 100 each 12   metFORMIN (GLUCOPHAGE) 500 MG tablet Take 2 tablets (1,000 mg total) by mouth 2 (two) times daily with a meal. 120 tablet 11   Multiple Vitamin (MULTIVITAMIN) tablet Take 1 tablet by mouth  daily.     insulin glargine (LANTUS) 100 UNIT/ML injection Inject 0.5 mLs (50 Units total) into the skin daily. 10 mL 11   lisinopril (ZESTRIL) 40 MG tablet TAKE 1 TABLET (40 MG TOTAL) BY MOUTH DAILY. 90 tablet 3   No facility-administered medications prior to visit.    No Known Allergies  ROS Review of Systems    Objective:    Physical Exam Constitutional:      Appearance: She is obese.  HENT:     Head: Normocephalic and atraumatic.  Cardiovascular:     Rate and Rhythm: Normal rate and regular rhythm.     Pulses: Normal pulses.          Dorsalis pedis pulses are 2+ on the right side and 2+ on the left side.       Posterior tibial pulses are 2+ on the right side and 2+ on the left side.     Heart sounds: Normal heart sounds.  Pulmonary:     Effort: Pulmonary effort is normal.     Breath sounds: Normal breath sounds.  Abdominal:     General: Abdomen is flat. Bowel sounds are normal.     Palpations: Abdomen is soft.  Musculoskeletal:        General: Tenderness (knee) present.     Cervical back: Normal range of motion.     Right lower leg: No edema.     Left lower leg: No edema.  Feet:     Right foot:     Protective Sensation: 10 sites tested.  10 sites sensed.     Toenail Condition: Right toenails are abnormally thick and long.     Left foot:     Protective Sensation: 10 sites tested.  10 sites sensed.     Toenail Condition: Left toenails are abnormally thick and long.  Skin:    General: Skin is warm.     Capillary Refill: Capillary refill takes less than 2 seconds.  Neurological:     General: No focal deficit present.     Mental Status: She is alert and oriented to person, place, and time.  Psychiatric:        Mood and Affect: Mood normal.        Behavior: Behavior normal.        Thought Content: Thought content normal.        Judgment: Judgment normal.    BP (!) 170/73 Comment: manual  Pulse 98   Temp 98.2 F (36.8 C)   Ht $R'5\' 2"'Wo$  (1.575 m)   Wt 206 lb  (93.4 kg)   SpO2  100%   BMI 37.68 kg/m  Wt Readings from Last 3 Encounters:  01/16/21 206 lb (93.4 kg)  10/16/20 204 lb 0.6 oz (92.6 kg)  08/12/20 190 lb (86.2 kg)     Health Maintenance Due  Topic Date Due   COVID-19 Vaccine (1) Never done   MAMMOGRAM  Never done   Zoster Vaccines- Shingrix (1 of 2) Never done   PAP SMEAR-Modifier  09/17/2020   INFLUENZA VACCINE  Never done    There are no preventive care reminders to display for this patient.  Lab Results  Component Value Date   TSH 1.090 07/17/2020   Lab Results  Component Value Date   WBC 4.7 08/12/2020   HGB 12.0 08/12/2020   HCT 36.6 08/12/2020   MCV 85.7 08/12/2020   PLT 193 08/12/2020   Lab Results  Component Value Date   NA 142 08/12/2020   K 4.0 08/12/2020   CO2 24 08/12/2020   GLUCOSE 116 (H) 08/12/2020   BUN 11 08/12/2020   CREATININE 0.76 08/12/2020   BILITOT 0.5 08/12/2020   ALKPHOS 62 08/12/2020   AST 49 (H) 08/12/2020   ALT 47 (H) 08/12/2020   PROT 6.7 08/12/2020   ALBUMIN 3.7 08/12/2020   CALCIUM 9.1 08/12/2020   ANIONGAP 9 08/12/2020   EGFR 84 07/17/2020   Lab Results  Component Value Date   CHOL 275 (H) 07/17/2020   Lab Results  Component Value Date   HDL 64 07/17/2020   Lab Results  Component Value Date   LDLCALC 191 (H) 07/17/2020   Lab Results  Component Value Date   TRIG 113 07/17/2020   Lab Results  Component Value Date   CHOLHDL 4.3 07/17/2020   Lab Results  Component Value Date   HGBA1C 7.7 (A) 01/16/2021   HGBA1C 7.7 01/16/2021   HGBA1C 7.7 (A) 01/16/2021   HGBA1C 7.7 (A) 01/16/2021      Assessment & Plan:   Problem List Items Addressed This Visit       Cardiovascular and Mediastinum   Essential hypertension Encouraged on going compliance with current medication regimen Encouraged home monitoring and recording BP <130/80 Eating a heart-healthy diet with less salt Encouraged regular physical activity  Recommend Weight loss       Endocrine   Type  2 diabetes mellitus with complication, with long-term current use of insulin (HCC) - Primary Encourage compliance with current treatment regimen  Dose adjustment decrease lantus to 25 units daily  Encourage regular CBG monitoring Encourage contacting office if excessive hyperglycemia and or hypoglycemia Lifestyle modification with healthy diet (fewer calories, more high fiber foods, whole grains and non-starchy vegetables, lower fat meat and fish, low-fat diary include healthy oils) regular exercise (physical activity) and weight loss Opthalmology exam discussed  Nutritional consult recommended Regular dental visits encouraged Home BP monitoring also encouraged goal <130/80  Statin therapy to be discussed   Relevant Orders   POCT URINALYSIS DIP (CLINITEK) (Completed)   HgB A1c (Completed)   Other Visit Diagnoses     Urine white blood cells increased       Relevant Orders   Urine Culture   Hyperlipidemia, unspecified hyperlipidemia type   Evaluation pending     Chronic pain of right knee     Declined PT at this time  Continue with brace       Meds ordered this encounter  Medications   insulin glargine (LANTUS) 100 UNIT/ML injection    Sig: Inject 0.5 mLs (50 Units total) into the skin  daily.    Dispense:  45 mL    Refill:  3    Order Specific Question:   Supervising Provider    Answer:   Tresa Garter [[id number]]   lisinopril (ZESTRIL) 40 MG tablet    Sig: TAKE 1 TABLET (40 MG TOTAL) BY MOUTH DAILY.    Dispense:  90 tablet    Refill:  3    Order Specific Question:   Supervising Provider    Answer:   Tresa Garter [id number]     Follow-up: Return in about 3 months (around 04/18/2021) for Follow up HTN [id number].    [name], NP

## 2021-01-17 LAB — VITAMIN B12: Vitamin B-12: 499 pg/mL (ref 232–1245)

## 2021-01-17 LAB — COMP. METABOLIC PANEL (12)
AST: 31 IU/L (ref 0–40)
Albumin/Globulin Ratio: 1.7 (ref 1.2–2.2)
Albumin: 4 g/dL (ref 3.8–4.9)
Alkaline Phosphatase: 76 IU/L (ref 44–121)
BUN/Creatinine Ratio: 23 (ref 9–23)
BUN: 17 mg/dL (ref 6–24)
Bilirubin Total: 0.3 mg/dL (ref 0.0–1.2)
Calcium: 9.5 mg/dL (ref 8.7–10.2)
Chloride: 105 mmol/L (ref 96–106)
Creatinine, Ser: 0.75 mg/dL (ref 0.57–1.00)
Globulin, Total: 2.3 g/dL (ref 1.5–4.5)
Glucose: 171 mg/dL — ABNORMAL HIGH (ref 70–99)
Potassium: 4.7 mmol/L (ref 3.5–5.2)
Sodium: 142 mmol/L (ref 134–144)
Total Protein: 6.3 g/dL (ref 6.0–8.5)
eGFR: 92 mL/min/{1.73_m2} (ref >59)

## 2021-01-17 LAB — LIPID PANEL
Chol/HDL Ratio: 5.3 ratio — ABNORMAL HIGH (ref 0.0–4.4)
Cholesterol, Total: 262 mg/dL — ABNORMAL HIGH (ref 100–199)
HDL: 49 mg/dL (ref >39)
LDL Chol Calc (NIH): 176 mg/dL — ABNORMAL HIGH (ref 0–99)
Triglycerides: 200 mg/dL — ABNORMAL HIGH (ref 0–149)
VLDL Cholesterol Cal: 37 mg/dL (ref 5–40)

## 2021-01-17 LAB — VITAMIN D 25 HYDROXY (VIT D DEFICIENCY, FRACTURES): Vit D, 25-Hydroxy: 13.7 ng/mL — ABNORMAL LOW (ref 30.0–100.0)

## 2021-01-17 LAB — MAGNESIUM: Magnesium: 1.6 mg/dL (ref 1.6–2.3)

## 2021-01-17 LAB — SEDIMENTATION RATE: Sed Rate: 21 mm/hr (ref 0–40)

## 2021-01-20 ENCOUNTER — Other Ambulatory Visit: Payer: Self-pay

## 2021-01-20 ENCOUNTER — Other Ambulatory Visit: Payer: Self-pay | Admitting: Nurse Practitioner

## 2021-01-20 LAB — URINE CULTURE

## 2021-01-20 MED ORDER — SIMVASTATIN 20 MG PO TABS
20.0000 mg | ORAL_TABLET | Freq: Every evening | ORAL | 11 refills | Status: DC
  Filled 2021-01-20: qty 30, 30d supply, fill #0

## 2021-01-20 MED ORDER — ERGOCALCIFEROL 1.25 MG (50000 UT) PO CAPS
50000.0000 [IU] | ORAL_CAPSULE | ORAL | 0 refills | Status: AC
  Filled 2021-01-20: qty 12, 84d supply, fill #0

## 2021-01-24 ENCOUNTER — Other Ambulatory Visit: Payer: Self-pay

## 2021-01-27 ENCOUNTER — Other Ambulatory Visit: Payer: Self-pay

## 2021-01-28 ENCOUNTER — Other Ambulatory Visit: Payer: Self-pay

## 2021-02-10 ENCOUNTER — Other Ambulatory Visit: Payer: Self-pay

## 2021-02-11 ENCOUNTER — Other Ambulatory Visit: Payer: Self-pay

## 2021-02-18 ENCOUNTER — Other Ambulatory Visit: Payer: Self-pay

## 2021-02-19 ENCOUNTER — Other Ambulatory Visit: Payer: Self-pay

## 2021-03-10 ENCOUNTER — Other Ambulatory Visit: Payer: Self-pay

## 2021-03-12 ENCOUNTER — Other Ambulatory Visit: Payer: Self-pay

## 2021-03-13 ENCOUNTER — Other Ambulatory Visit: Payer: Self-pay

## 2021-03-28 ENCOUNTER — Other Ambulatory Visit: Payer: Self-pay

## 2021-04-10 ENCOUNTER — Other Ambulatory Visit: Payer: Self-pay

## 2021-04-18 ENCOUNTER — Ambulatory Visit: Payer: Self-pay | Admitting: Nurse Practitioner

## 2021-04-18 ENCOUNTER — Other Ambulatory Visit: Payer: Self-pay

## 2021-04-19 ENCOUNTER — Other Ambulatory Visit: Payer: Self-pay

## 2021-04-21 ENCOUNTER — Other Ambulatory Visit: Payer: Self-pay

## 2021-05-02 ENCOUNTER — Other Ambulatory Visit: Payer: Self-pay

## 2021-05-02 ENCOUNTER — Ambulatory Visit: Payer: Self-pay | Admitting: Nurse Practitioner

## 2021-05-06 ENCOUNTER — Other Ambulatory Visit: Payer: Self-pay

## 2021-05-09 ENCOUNTER — Ambulatory Visit (INDEPENDENT_AMBULATORY_CARE_PROVIDER_SITE_OTHER): Payer: Self-pay | Admitting: Nurse Practitioner

## 2021-05-09 ENCOUNTER — Other Ambulatory Visit: Payer: Self-pay

## 2021-05-09 ENCOUNTER — Encounter: Payer: Self-pay | Admitting: Nurse Practitioner

## 2021-05-09 VITALS — BP 153/57 | HR 84 | Resp 16 | Wt 209.2 lb

## 2021-05-09 DIAGNOSIS — E118 Type 2 diabetes mellitus with unspecified complications: Secondary | ICD-10-CM

## 2021-05-09 DIAGNOSIS — Z794 Long term (current) use of insulin: Secondary | ICD-10-CM

## 2021-05-09 DIAGNOSIS — I10 Essential (primary) hypertension: Secondary | ICD-10-CM

## 2021-05-09 DIAGNOSIS — G8929 Other chronic pain: Secondary | ICD-10-CM

## 2021-05-09 DIAGNOSIS — E785 Hyperlipidemia, unspecified: Secondary | ICD-10-CM

## 2021-05-09 DIAGNOSIS — R82998 Other abnormal findings in urine: Secondary | ICD-10-CM

## 2021-05-09 DIAGNOSIS — M25561 Pain in right knee: Secondary | ICD-10-CM

## 2021-05-09 LAB — POCT URINALYSIS DIP (CLINITEK)
Bilirubin, UA: NEGATIVE
Blood, UA: NEGATIVE
Glucose, UA: NEGATIVE mg/dL
Ketones, POC UA: NEGATIVE mg/dL
Nitrite, UA: NEGATIVE
POC PROTEIN,UA: NEGATIVE
Spec Grav, UA: 1.015 (ref 1.010–1.025)
Urobilinogen, UA: 0.2 E.U./dL
pH, UA: 6.5 (ref 5.0–8.0)

## 2021-05-09 LAB — POCT GLYCOSYLATED HEMOGLOBIN (HGB A1C): HbA1c, POC (controlled diabetic range): 7.6 % — AB (ref 0.0–7.0)

## 2021-05-09 LAB — GLUCOSE, POCT (MANUAL RESULT ENTRY): POC Glucose: 180 mg/dl — AB (ref 70–99)

## 2021-05-09 MED ORDER — ALBUTEROL SULFATE HFA 108 (90 BASE) MCG/ACT IN AERS
2.0000 | INHALATION_SPRAY | Freq: Four times a day (QID) | RESPIRATORY_TRACT | 2 refills | Status: DC | PRN
  Filled 2021-05-09: qty 8.5, 25d supply, fill #0

## 2021-05-09 MED ORDER — KNEE BRACE ADJUSTABLE HINGED MISC: 1.0000 "application " | Freq: Every day | 0 refills | Status: DC

## 2021-05-09 MED ORDER — CAREONE BLOOD GLUCOSE SYSTEM W/DEVICE KIT: PACK | 0 refills | Status: DC

## 2021-05-09 MED ORDER — INSULIN GLARGINE 100 UNIT/ML ~~LOC~~ SOLN
50.0000 [IU] | Freq: Every day | SUBCUTANEOUS | 3 refills | Status: DC
  Filled 2021-05-09: qty 10, 20d supply, fill #0

## 2021-05-09 NOTE — Progress Notes (Signed)
Established Patient Office Visit  Subjective:  Patient ID: Wanda Weaver, female    DOB: 20-May-1960  Age: 61 y.o. MRN: [id number]  CC:  Chief Complaint  Patient presents with   Follow-up   Hypertension   Knee Pain   Medication Refill    HPI Wanda Weaver presents for follow up. She  has a past medical history of Bilateral lower extremity edema (07/2019), Bilateral lower extremity pain (07/2019), Diabetes mellitus without complication ([place]), Hemoglobin A1C greater than 9%, indicating poor diabetic control, Hyperlipidemia, Hypertension, and Vitamin D deficiency.   She is in today for follow-up for diabetes.  She is currently prescribed Lantus 50 units daily along with metformin 1000 mg twice daily.  She reports now that she is having some elevation in her CBG check up to 300.  The range 97-344 She has had a 3 pound weight gain. Denies headache, dizziness, visual changes, shortness of breath, dyspnea on exertion, chest pain, nausea, vomiting or any edema.   She is also following up for hypertension.  She is currently on lisinopril 40 mg daily.  Previous blood pressures were elevated.  Today blood pressure is 153/57.  She does monitor blood pressure at home.  She reports blood pressure range 130/70  Knee Pain Patient presents with knee pain involving the right knee. Onset of the symptoms was several years ago. Inciting event: none known. Current symptoms include crepitus sensation, locking, pain located around the inside and around the whole knee cap, popping sensation, and swelling. Pain is aggravated by any weight bearing, standing, and walking. Patient has had prior knee problems. Evaluation to date: plain films: abnormal mild osteoarthritic  . Treatment to date: avoidance of offending activity, brace which is ineffective, OTC analgesics which are not very effective, and rest. She has had to reduce her hours at work. [employer]  Past Medical History:  Diagnosis Date    Bilateral lower extremity edema 07/2019   Bilateral lower extremity pain 07/2019   Diabetes mellitus without complication (HCC)    Hemoglobin A1C greater than 9%, indicating poor diabetic control    Hyperlipidemia    Hypertension    Vitamin D deficiency     Past Surgical History:  Procedure Laterality Date   APPENDECTOMY     CARPAL TUNNEL RELEASE Bilateral    CHOLECYSTECTOMY     WRIST SURGERY Bilateral     Family History  Problem Relation Age of Onset   Diabetes Mother    Dementia Mother    Diabetes Father    Hypertension Father     Social History   Socioeconomic History   Marital status: Single    Spouse name: Not on file   Number of children: Not on file   Years of education: Not on file   Highest education level: Not on file  Occupational History   Not on file  Tobacco Use   Smoking status: Never   Smokeless tobacco: Never  Vaping Use   Vaping Use: Never used  Substance and Sexual Activity   Alcohol use: No   Drug use: No   Sexual activity: Yes    Partners: Male  Other Topics Concern   Not on file  Social History Narrative   Not on file   Social Determinants of Health   Financial Resource Strain: Not on file  Food Insecurity: Not on file  Transportation Needs: Not on file  Physical Activity: Not on file  Stress: Not on file  Social Connections: Not on  file  Intimate Partner Violence: Not on file    Outpatient Medications Prior to Visit  Medication Sig Dispense Refill   ibuprofen (ADVIL) 800 MG tablet Take 1 tablet (800 mg total) by mouth every 8 (eight) hours as needed. 60 tablet 6   Insulin Syringe-Needle U-100 (BD INSULIN SYRINGE ULTRAFINE) 31G X 5/16" 0.5 ML MISC use to inject 50 units of insulin daily 100 each 11   Lancets 30G MISC 1 each by Does not apply route as directed. 100 each 12   lisinopril (ZESTRIL) 40 MG tablet TAKE 1 TABLET (40 MG TOTAL) BY MOUTH DAILY. 90 tablet 3   metFORMIN (GLUCOPHAGE) 500 MG  tablet Take 2 tablets (1,000 mg total) by mouth 2 (two) times daily with a meal. 120 tablet 11   Multiple Vitamin (MULTIVITAMIN) tablet Take 1 tablet by mouth daily.     simvastatin (ZOCOR) 20 MG tablet Take 1 tablet (20 mg total) by mouth every evening. 30 tablet 11   blood glucose meter kit and supplies KIT Dispense based on patient and insurance preference. 100 each 12   glucose blood (ACCU-CHEK AVIVA) test strip Use as instructed 300 each 3   glucose blood (TRUE METRIX BLOOD GLUCOSE TEST) test strip Use as instructed 300 each 3   insulin glargine (LANTUS) 100 UNIT/ML injection Inject 0.5 mLs (50 Units total) into the skin daily. 45 mL 3   Insulin Pen Needle (BD ULTRA-FINE PEN NEEDLES) 29G X 12.7MM MISC ICD10 E11.9 for use with insulin administration 200 each 2   No facility-administered medications prior to visit.    No Known Allergies  ROS Review of Systems    Objective:    Physical Exam Constitutional:      Appearance: She is obese.  HENT:     Head: Normocephalic and atraumatic.     Nose: Nose normal.     Mouth/Throat:     Mouth: Mucous membranes are moist.  Cardiovascular:     Rate and Rhythm: Normal rate and regular rhythm.     Pulses: Normal pulses.     Heart sounds: Normal heart sounds.  Pulmonary:     Effort: Pulmonary effort is normal.     Breath sounds: Normal breath sounds.  Abdominal:     Palpations: Abdomen is soft.     Comments: hypoactive  Musculoskeletal:        General: Swelling and tenderness present. Normal range of motion.     Cervical back: Normal range of motion.     Right lower leg: Edema present.     Left lower leg: Edema present.  Skin:    General: Skin is warm and dry.     Capillary Refill: Capillary refill takes less than 2 seconds.  Neurological:     General: No focal deficit present.     Mental Status: She is alert and oriented to person, place, and time.    BP (!) 153/57    Pulse 84    Resp 16    Wt 209 lb 3.2 oz (94.9 kg)     SpO2 100%    BMI 38.26 kg/m  Wt Readings from Last 3 Encounters:  05/09/21 209 lb 3.2 oz (94.9 kg)  01/16/21 206 lb (93.4 kg)  10/16/20 204 lb 0.6 oz (92.6 kg)     Health Maintenance Due  Topic Date Due   MAMMOGRAM  Never done    There are no preventive care reminders to display for this patient.  Lab Results  Component Value Date  TSH 1.090 07/17/2020   Lab Results  Component Value Date   WBC 4.7 08/12/2020   HGB 12.0 08/12/2020   HCT 36.6 08/12/2020   MCV 85.7 08/12/2020   PLT 193 08/12/2020   Lab Results  Component Value Date   NA 142 01/16/2021   K 4.7 01/16/2021   CO2 24 08/12/2020   GLUCOSE 171 (H) 01/16/2021   BUN 17 01/16/2021   CREATININE 0.75 01/16/2021   BILITOT 0.3 01/16/2021   ALKPHOS 76 01/16/2021   AST 31 01/16/2021   ALT 47 (H) 08/12/2020   PROT 6.3 01/16/2021   ALBUMIN 4.0 01/16/2021   CALCIUM 9.5 01/16/2021   ANIONGAP 9 08/12/2020   EGFR 92 01/16/2021   Lab Results  Component Value Date   CHOL 262 (H) 01/16/2021   Lab Results  Component Value Date   HDL 49 01/16/2021   Lab Results  Component Value Date   LDLCALC 176 (H) 01/16/2021   Lab Results  Component Value Date   TRIG 200 (H) 01/16/2021   Lab Results  Component Value Date   CHOLHDL 5.3 (H) 01/16/2021   Lab Results  Component Value Date   HGBA1C 7.6 (A) 05/09/2021      Assessment & Plan:   Problem List Items Addressed This Visit       Cardiovascular and Mediastinum   Essential hypertension Encouraged on going compliance with current medication regimen Encouraged home monitoring and recording BP <130/80 Eating a heart-healthy diet with less salt Encouraged regular physical activity  Recommend Weight loss       Endocrine   Type 2 diabetes mellitus with complication, with long-term current use of insulin (HCC) - Primary Encourage compliance with current treatment regimen   Encourage regular CBG monitoring Encourage contacting office if excessive  hyperglycemia and or hypoglycemia Lifestyle modification with healthy diet (fewer calories, more high fiber foods, whole grains and non-starchy vegetables, lower fat meat and fish, low-fat diary include healthy oils) regular exercise (physical activity) and weight loss Opthalmology exam discussed  Nutritional consult recommended Regular dental visits encouraged Home BP monitoring also encouraged goal <130/80    Relevant Medications   insulin glargine (LANTUS) 100 UNIT/ML injection   Other Relevant Orders   POCT glucose (manual entry) (Completed)   POCT glycosylated hemoglobin (Hb A1C) (Completed)   Other Visit Diagnoses     Hyperlipidemia, unspecified hyperlipidemia type     Continue with current regimen.  No changes warranted. Good patient compliance.    Chronic pain of right knee     Worsening  Education and brace  [place] card to be obtained to get additional assistance (CSW to assist)        Meds ordered this encounter  Medications   albuterol (VENTOLIN HFA) 108 (90 Base) MCG/ACT inhaler    Sig: Inhale 2 puffs into the lungs every 6 (six) hours as needed for wheezing or shortness of breath.    Dispense:  8 g    Refill:  2    Order Specific Question:   Supervising Provider    Answer:   Tresa Garter [[id number]]   Blood Glucose Monitoring Suppl (CAREONE BLOOD GLUCOSE SYSTEM) w/Device KIT    Sig: QID prn    Dispense:  1 kit    Refill:  0    Order Specific Question:   Supervising Provider    Answer:   Tresa Garter [[id number]]   insulin glargine (LANTUS) 100 UNIT/ML injection    Sig: Inject 0.5 mLs (50 Units total) into the  skin daily.    Dispense:  45 mL    Refill:  3    Order Specific Question:   Supervising Provider    Answer:   Tresa Garter [id number]   Elastic Bandages & Supports (KNEE BRACE ADJUSTABLE HINGED) MISC    Sig: 1 application by Does not apply route daily.    Dispense:  1 each    Refill:  0    Order Specific Question:   Supervising  Provider    Answer:   Tresa Garter [id number]    Follow-up: Return in about 3 months (around 08/07/2021) for follow up [id number].    [name], NP

## 2021-05-09 NOTE — Addendum Note (Signed)
Addended by: Lindley Magnus L on: 05/09/2021 03:34 PM   Modules accepted: Orders

## 2021-05-09 NOTE — Patient Instructions (Signed)
Arthritis Arthritis means joint pain. It can also mean joint disease. A joint is a place where bones come together. There are more than 100 types of arthritis. What are the causes? This condition may be caused by: Wear and tear of a joint. This is the most common cause. A lot of acid in the blood, which leads to pain in the joint (gout). Pain and swelling (inflammation) in a joint. Infection of a joint. Injuries in the joint. A reaction to medicines (allergy). In some cases, the cause may not be known. What are the signs or symptoms? Symptoms of this condition include: Redness at a joint. Swelling at a joint. Stiffness at a joint. Warmth coming from the joint. A fever. A feeling of being sick. How is this treated? This condition may be treated with: Treating the cause, if it is known. Rest. Raising (elevating) the joint. Putting cold or hot packs on the joint. Medicines to treat symptoms and reduce pain and swelling. Shots of medicines (cortisone) into the joint. You may also be told to make changes in your life, such as doing exercises and losing weight. Follow these instructions at home: Medicines Take over-the-counter and prescription medicines only as told by your doctor. Do not take aspirin for pain if your doctor says that you may have gout. Activity Rest your joint if your doctor tells you to. Avoid activities that make the pain worse. Exercise your joint regularly as told by your doctor. Try doing exercises like: Swimming. Water aerobics. Biking. Walking. Managing pain, stiffness, and swelling   If told, put ice on the affected area. Put ice in a plastic bag. Place a towel between your skin and the bag. Leave the ice on for 20 minutes, 2-3 times per day. If your joint is swollen, raise (elevate) it above the level of your heart if told by your doctor. If your joint feels stiff in the morning, try taking a warm shower. If told, put heat on the affected area. Do  this as often as told by your doctor. Use the heat source that your doctor recommends, such as a moist heat pack or a heating pad. If you have diabetes, do not apply heat without asking your doctor. To apply heat: Place a towel between your skin and the heat source. Leave the heat on for 20-30 minutes. Remove the heat if your skin turns bright red. This is very important if you are unable to feel pain, heat, or cold. You may have a greater risk of getting burned. General instructions Do not use any products that contain nicotine or tobacco, such as cigarettes, e-cigarettes, and chewing tobacco. If you need help quitting, ask your doctor. Keep all follow-up visits as told by your doctor. This is important. Contact a doctor if: The pain gets worse. You have a fever. Get help right away if: You have very bad pain in your joint. You have swelling in your joint. Your joint is red. Many joints become painful and swollen. You have very bad back pain. Your leg is very weak. You cannot control your pee (urine) or poop (stool). Summary Arthritis means joint pain. It can also mean joint disease. A joint is a place where bones come together. The most common cause of this condition is wear and tear of a joint. Symptoms of this condition include redness, swelling, or stiffness of the joint. This condition is treated with rest, raising the joint, medicines, and putting cold or hot packs on the joint. Follow your   doctor's instructions about medicines, activity, exercises, and other home care treatments. This information is not intended to replace advice given to you by your health care provider. Make sure you discuss any questions you have with your health care provider. Document Revised: 03/07/2018 Document Reviewed: 03/07/2018 Elsevier Patient Education  2022 [employer]  

## 2021-05-09 NOTE — Progress Notes (Signed)
Patient stated to having knee pain and swelling. States pain after work is a 10 and when the knee(right) is moving pt states to feeling and hearing cracking.  Patient also states that blood suagr spikes really high(300) and drops in to the low ranges (80-90)  Med Refills: Insulin and inhaler

## 2021-05-10 LAB — COMP. METABOLIC PANEL (12)
AST: 20 IU/L (ref 0–40)
Albumin/Globulin Ratio: 2 (ref 1.2–2.2)
Albumin: 4.1 g/dL (ref 3.8–4.9)
Alkaline Phosphatase: 73 IU/L (ref 44–121)
BUN/Creatinine Ratio: 19 (ref 9–23)
BUN: 17 mg/dL (ref 6–24)
Bilirubin Total: 0.4 mg/dL (ref 0.0–1.2)
Calcium: 9.1 mg/dL (ref 8.7–10.2)
Chloride: 106 mmol/L (ref 96–106)
Creatinine, Ser: 0.88 mg/dL (ref 0.57–1.00)
Globulin, Total: 2.1 g/dL (ref 1.5–4.5)
Glucose: 164 mg/dL — ABNORMAL HIGH (ref 70–99)
Potassium: 4.6 mmol/L (ref 3.5–5.2)
Sodium: 143 mmol/L (ref 134–144)
Total Protein: 6.2 g/dL (ref 6.0–8.5)
eGFR: 76 mL/min/{1.73_m2} (ref >59)

## 2021-05-10 LAB — LIPID PANEL
Chol/HDL Ratio: 4.9 ratio — ABNORMAL HIGH (ref 0.0–4.4)
Cholesterol, Total: 259 mg/dL — ABNORMAL HIGH (ref 100–199)
HDL: 53 mg/dL (ref >39)
LDL Chol Calc (NIH): 178 mg/dL — ABNORMAL HIGH (ref 0–99)
Triglycerides: 155 mg/dL — ABNORMAL HIGH (ref 0–149)
VLDL Cholesterol Cal: 28 mg/dL (ref 5–40)

## 2021-05-11 LAB — URINE CULTURE

## 2021-05-13 ENCOUNTER — Telehealth: Payer: Self-pay | Admitting: Clinical

## 2021-05-13 NOTE — Telephone Encounter (Signed)
Integrated Behavioral Health Case Management Referral Note  05/13/2021 Name: Wanda Weaver MRN: [id number] DOB: 1960/06/28 Wanda Weaver is a 61 y.o. year old female who sees [name], NP for primary care. LCSW was consulted to assess patient's needs and assist the patient with Financial Difficulties related to inadequate health insurance and low income .  Interpreter: No.   Interpreter Name & Language: none  Assessment: Patient experiencing financial difficulties related to inadequate health insurance and low income. Received referral  from PCP following patient's last visit.  Intervention: CSW called patient to follow up. Reviewed [employer] application and advised patient on supporting documents to be submitted with the application. Advised patient to follow up with CSW for assistance in scheduling appointment with financial counselor at [facility]) to submit the application. Provided CSW contact information. Will mail application to patient.  Patient also interested in applying for disability. Discussed referral to [facility]. Patient also in need of food assistance; discussed referral to [employer] program.  Review of patient status, including review of consultants reports, relevant laboratory and other test results, and collaboration with appropriate care team members and the patient's provider was performed as part of comprehensive patient evaluation and provision of services.    Estanislado Emms, [facility] [phone]

## 2021-05-19 ENCOUNTER — Other Ambulatory Visit: Payer: Self-pay

## 2021-05-19 ENCOUNTER — Ambulatory Visit (INDEPENDENT_AMBULATORY_CARE_PROVIDER_SITE_OTHER): Payer: Self-pay | Admitting: Nurse Practitioner

## 2021-05-19 ENCOUNTER — Encounter: Payer: Self-pay | Admitting: Nurse Practitioner

## 2021-05-19 VITALS — BP 179/73 | HR 85 | Temp 98.2°F | Ht 62.0 in | Wt 203.5 lb

## 2021-05-19 DIAGNOSIS — I10 Essential (primary) hypertension: Secondary | ICD-10-CM

## 2021-05-19 DIAGNOSIS — M25561 Pain in right knee: Secondary | ICD-10-CM

## 2021-05-19 DIAGNOSIS — Z794 Long term (current) use of insulin: Secondary | ICD-10-CM

## 2021-05-19 DIAGNOSIS — E118 Type 2 diabetes mellitus with unspecified complications: Secondary | ICD-10-CM

## 2021-05-19 DIAGNOSIS — G8929 Other chronic pain: Secondary | ICD-10-CM

## 2021-05-19 LAB — POCT GLYCOSYLATED HEMOGLOBIN (HGB A1C)
HbA1c POC (<> result, manual entry): 7.7 % (ref 4.0–5.6)
HbA1c, POC (controlled diabetic range): 7.7 % — AB (ref 0.0–7.0)
HbA1c, POC (prediabetic range): 7.7 % — AB (ref 5.7–6.4)
Hemoglobin A1C: 7.7 % — AB (ref 4.0–5.6)

## 2021-05-19 MED ORDER — INSULIN GLARGINE 100 UNIT/ML ~~LOC~~ SOLN
60.0000 [IU] | Freq: Every day | SUBCUTANEOUS | 2 refills | Status: DC
  Filled 2021-05-19 – 2021-05-27 (×3): qty 20, 33d supply, fill #0
  Filled 2021-07-02 – 2021-07-04 (×2): qty 20, 33d supply, fill #1
  Filled 2021-08-12: qty 10, 16d supply, fill #2
  Filled 2021-08-30: qty 10, 16d supply, fill #3

## 2021-05-19 MED ORDER — DICLOFENAC SODIUM 1 % EX GEL
2.0000 g | Freq: Four times a day (QID) | CUTANEOUS | 0 refills | Status: AC
  Filled 2021-05-19: qty 100, 12d supply, fill #0

## 2021-05-19 MED ORDER — KETOROLAC TROMETHAMINE 30 MG/ML IJ SOLN
15.0000 mg | Freq: Once | INTRAMUSCULAR | Status: AC
  Administered 2021-05-19: 15 mg via INTRAMUSCULAR

## 2021-05-19 MED ORDER — LISINOPRIL-HYDROCHLOROTHIAZIDE 20-12.5 MG PO TABS
2.0000 | ORAL_TABLET | Freq: Every day | ORAL | 2 refills | Status: DC
  Filled 2021-05-19: qty 60, 30d supply, fill #0

## 2021-05-19 MED ORDER — GLIPIZIDE 5 MG PO TABS
5.0000 mg | ORAL_TABLET | Freq: Two times a day (BID) | ORAL | 3 refills | Status: DC
  Filled 2021-05-19: qty 60, 30d supply, fill #0

## 2021-05-19 MED ORDER — PREDNISONE 20 MG PO TABS
20.0000 mg | ORAL_TABLET | Freq: Every day | ORAL | 0 refills | Status: AC
  Filled 2021-05-19: qty 3, 3d supply, fill #0

## 2021-05-19 NOTE — Patient Instructions (Signed)
You were seen today in the [facility] for reevaluation of right knee pain, hypertension and diabetes. Labs were collected, results will be available via MyChart or, if abnormal, you will be contacted by clinic staff. You were prescribed medications, please take as directed. Please monitor your diet and sodium intake. Please follow up in 3 mths for reevaluation.

## 2021-05-19 NOTE — Progress Notes (Signed)
[facility] Patient [facility] [address] [place], [place]  [address] Phone:  [phone]   Fax:  [phone] Subjective:   Patient ID: Wanda Weaver, female    DOB: 05-31-1960, 61 y.o.   MRN: [id number]  Chief Complaint  Patient presents with   Establish Care    Pt is here for rt knee pain it is hard for her to walk. Pt stated she had this pain for a year ever since she had covid.   HPI Wanda Weaver 61 y.o. female  has a past medical history of Bilateral lower extremity edema (07/2019), Bilateral lower extremity pain (07/2019), Diabetes mellitus without complication (HCC), Hemoglobin A1C greater than 9%, indicating poor diabetic control, Hyperlipidemia, Hypertension, and Vitamin D deficiency.  To the [facility] for right knee pain.  Hypertension: Patient here for follow-up of elevated blood pressure. She is exercising and is not adherent to low salt diet.  Blood pressure is well controlled at home, last home B/P 134/75. Cardiac symptoms none. Patient denies chest pain, chest pressure/discomfort, dyspnea, exertional chest pressure/discomfort, fatigue, near-syncope, orthopnea, and syncope.  Cardiovascular risk factors: advanced age (older than 3 for men, 42 for women), diabetes mellitus, dyslipidemia, hypertension, and obesity (BMI >= 30 kg/m2). Use of agents associated with hypertension: none. History of target organ damage: none  Diabetes Mellitus: Patient presents for follow up of diabetes. Symptoms: hyperglycemia. Symptoms have waxed and waned. Patient denies foot ulcerations, hypoglycemia , increase appetite, paresthesia of the feet, polydipsia, and visual disturbances.  Evaluation to date has been included: hemoglobin A1C.  Home sugars: BGs have been labile ranging between 150 and 200 . Treatment to date: Increased dose of insulin which has been ineffective.    Also concerned about right knee pain x 2 yrs. Currently wears knee brace at work and uses OTC medications as needed for pain.  Increased pain and swelling with prolonged weight bearing and walking. Denies any pain during visit. Patient currently compliant with all medications. Denies any other complaints today.  Denies any fever. Denies any fatigue, chest pain, shortness of breath, HA or dizziness. Denies any blurred vision, numbness or tingling.   Past Medical History:  Diagnosis Date   Bilateral lower extremity edema 07/2019   Bilateral lower extremity pain 07/2019   Diabetes mellitus without complication (HCC)    Hemoglobin A1C greater than 9%, indicating poor diabetic control    Hyperlipidemia    Hypertension    Vitamin D deficiency     Past Surgical History:  Procedure Laterality Date   APPENDECTOMY     CARPAL TUNNEL RELEASE Bilateral    CHOLECYSTECTOMY     WRIST SURGERY Bilateral     Family History  Problem Relation Age of Onset   Diabetes Mother    Dementia Mother    Diabetes Father    Hypertension Father     Social History   Socioeconomic History   Marital status: Single    Spouse name: Not on file   Number of children: Not on file   Years of education: Not on file   Highest education level: Not on file  Occupational History   Not on file  Tobacco Use   Smoking status: Never   Smokeless tobacco: Never  Vaping Use   Vaping Use: Never used  Substance and Sexual Activity   Alcohol use: No   Drug use: No   Sexual activity: Yes    Partners: Male  Other Topics Concern   Not on file  Social History Narrative  Not on file   Social Determinants of Health   Financial Resource Strain: Not on file  Food Insecurity: Not on file  Transportation Needs: Not on file  Physical Activity: Not on file  Stress: Not on file  Social Connections: Not on file  Intimate Partner Violence: Not on file    Outpatient Medications Prior to Visit  Medication Sig Dispense Refill   albuterol (VENTOLIN HFA) 108 (90 Base) MCG/ACT inhaler Inhale 2 puffs into the lungs every 6 (six) hours as needed for  wheezing or shortness of breath. 8.5 g 2   Blood Glucose Monitoring Suppl (CAREONE BLOOD GLUCOSE SYSTEM) w/Device KIT QID prn 1 kit 0   Elastic Bandages & Supports (KNEE BRACE ADJUSTABLE HINGED) MISC 1 application by Does not apply route daily. 1 each 0   ibuprofen (ADVIL) 800 MG tablet Take 1 tablet (800 mg total) by mouth every 8 (eight) hours as needed. 60 tablet 6   Insulin Syringe-Needle U-100 (BD INSULIN SYRINGE ULTRAFINE) 31G X 5/16" 0.5 ML MISC use to inject 50 units of insulin daily 100 each 11   Lancets 30G MISC 1 each by Does not apply route as directed. 100 each 12   metFORMIN (GLUCOPHAGE) 500 MG tablet Take 2 tablets (1,000 mg total) by mouth 2 (two) times daily with a meal. 120 tablet 11   Multiple Vitamin (MULTIVITAMIN) tablet Take 1 tablet by mouth daily.     insulin glargine (LANTUS) 100 UNIT/ML injection Inject 0.5 mLs (50 Units total) into the skin daily. 45 mL 3   lisinopril (ZESTRIL) 40 MG tablet TAKE 1 TABLET (40 MG TOTAL) BY MOUTH DAILY. 90 tablet 3   simvastatin (ZOCOR) 20 MG tablet Take 1 tablet (20 mg total) by mouth every evening. (Patient not taking: Reported on 05/19/2021) 30 tablet 11   No facility-administered medications prior to visit.    No Known Allergies  Review of Systems  Constitutional:  Negative for chills, fever and malaise/fatigue.  Respiratory:  Negative for cough and shortness of breath.   Cardiovascular:  Negative for chest pain, palpitations and leg swelling.  Gastrointestinal:  Negative for abdominal pain, blood in stool, constipation, diarrhea, nausea and vomiting.  Genitourinary: Negative.   Musculoskeletal:  Positive for joint pain.  Skin: Negative.   Neurological: Negative.   Psychiatric/Behavioral:  Negative for depression. The patient is not nervous/anxious.   All other systems reviewed and are negative.     Objective:    Physical Exam Vitals reviewed.  Constitutional:      General: She is not in acute distress.    Appearance:  Normal appearance. She is obese.  HENT:     Head: Normocephalic.  Cardiovascular:     Rate and Rhythm: Normal rate and regular rhythm.     Pulses: Normal pulses.     Heart sounds: Normal heart sounds.     Comments: No obvious peripheral edema Pulmonary:     Effort: Pulmonary effort is normal.     Breath sounds: Normal breath sounds.  Musculoskeletal:        General: No swelling, tenderness, deformity or signs of injury. Normal range of motion.     Right lower leg: No edema.     Left lower leg: No edema.     Comments: Mild pain noted in diffuse right knee with extension, notable grimacing   Skin:    General: Skin is warm and dry.     Capillary Refill: Capillary refill takes less than 2 seconds.  Neurological:  General: No focal deficit present.     Mental Status: She is alert and oriented to person, place, and time.  Psychiatric:        Mood and Affect: Mood normal.        Behavior: Behavior normal.        Thought Content: Thought content normal.        Judgment: Judgment normal.    BP (!) 179/73    Pulse 85    Temp 98.2 F (36.8 C)    Ht $R'5\' 2"'hw$  (1.575 m)    Wt 203 lb 8 oz (92.3 kg)    SpO2 100%    BMI 37.22 kg/m  Wt Readings from Last 3 Encounters:  05/19/21 203 lb 8 oz (92.3 kg)  05/09/21 209 lb 3.2 oz (94.9 kg)  01/16/21 206 lb (93.4 kg)    Immunization History  Administered Date(s) Administered   Pneumococcal Polysaccharide-23 08/06/2017    Diabetic Foot Exam - Simple   No data filed     Lab Results  Component Value Date   TSH 1.090 07/17/2020   Lab Results  Component Value Date   WBC 4.7 08/12/2020   HGB 12.0 08/12/2020   HCT 36.6 08/12/2020   MCV 85.7 08/12/2020   PLT 193 08/12/2020   Lab Results  Component Value Date   NA 143 05/09/2021   K 4.6 05/09/2021   CO2 24 08/12/2020   GLUCOSE 164 (H) 05/09/2021   BUN 17 05/09/2021   CREATININE 0.88 05/09/2021   BILITOT 0.4 05/09/2021   ALKPHOS 73 05/09/2021   AST 20 05/09/2021   ALT 47 (H)  08/12/2020   PROT 6.2 05/09/2021   ALBUMIN 4.1 05/09/2021   CALCIUM 9.1 05/09/2021   ANIONGAP 9 08/12/2020   EGFR 76 05/09/2021   Lab Results  Component Value Date   CHOL 259 (H) 05/09/2021   CHOL 262 (H) 01/16/2021   CHOL 275 (H) 07/17/2020   Lab Results  Component Value Date   HDL 53 05/09/2021   HDL 49 01/16/2021   HDL 64 07/17/2020   Lab Results  Component Value Date   LDLCALC 178 (H) 05/09/2021   LDLCALC 176 (H) 01/16/2021   LDLCALC 191 (H) 07/17/2020   Lab Results  Component Value Date   TRIG 155 (H) 05/09/2021   TRIG 200 (H) 01/16/2021   TRIG 113 07/17/2020   Lab Results  Component Value Date   CHOLHDL 4.9 (H) 05/09/2021   CHOLHDL 5.3 (H) 01/16/2021   CHOLHDL 4.3 07/17/2020   Lab Results  Component Value Date   HGBA1C 7.6 (A) 05/09/2021   HGBA1C 7.7 (A) 01/16/2021   HGBA1C 7.7 01/16/2021   HGBA1C 7.7 (A) 01/16/2021   HGBA1C 7.7 (A) 01/16/2021       Assessment & Plan:   Problem List Items Addressed This Visit       Cardiovascular and Mediastinum   Essential hypertension   Relevant Medications   lisinopril-hydrochlorothiazide (ZESTORETIC) 20-12.5 MG tablet, medication changed Encouraged continued diet and exercise efforts  Encouraged continued compliance with medication  Discussed diet at length Encouraged to continue checking B/P regularly at home     Endocrine   Type 2 diabetes mellitus with complication, with long-term current use of insulin (HCC) - Primary   Relevant Medications   lisinopril-hydrochlorothiazide (ZESTORETIC) 20-12.5 MG tablet   insulin glargine (LANTUS) 100 UNIT/ML injection, medication increased   glipiZIDE (GLUCOTROL) 5 MG tablet, medication added to treatment plan  Encouraged continued diet and exercise efforts  Encouraged continued  compliance with medication    Other Relevant Orders   POCT glycosylated hemoglobin (Hb A1C): 7.7, not at goal , medication changes implemented   Other Visit Diagnoses     Chronic pain of  right knee       Relevant Medications   predniSONE (DELTASONE) 20 MG tablet   ketorolac (TORADOL) 30 MG/ML injection 15 mg   diclofenac Sodium (VOLTAREN) 1 % GEL Informed to continue usage of knee brace  Informed to continue taking OTC medications as needed    Other Relevant Orders   AMB referral to orthopedics   Follow up in 3 mths for reevaluation of chronic illness, sooner as needed    I have discontinued Wanda Weaver's lisinopril. I have also changed her insulin glargine. Additionally, I am having her start on lisinopril-hydrochlorothiazide, glipiZIDE, predniSONE, and diclofenac Sodium. Lastly, I am having her maintain her Lancets 30G, multivitamin, ibuprofen, metFORMIN, Insulin Syringe-Needle U-100, simvastatin, albuterol, CareOne Blood Glucose System, and Knee Brace Adjustable Hinged. We will continue to administer ketorolac.  Meds ordered this encounter  Medications   lisinopril-hydrochlorothiazide (ZESTORETIC) 20-12.5 MG tablet    Sig: Take 2 tablets by mouth daily.    Dispense:  60 tablet    Refill:  2   insulin glargine (LANTUS) 100 UNIT/ML injection    Sig: Inject 0.6 mLs (60 Units total) into the skin at bedtime.    Dispense:  20 mL    Refill:  2   glipiZIDE (GLUCOTROL) 5 MG tablet    Sig: Take 1 tablet (5 mg total) by mouth 2 (two) times daily before a meal.    Dispense:  60 tablet    Refill:  3   predniSONE (DELTASONE) 20 MG tablet    Sig: Take 1 tablet (20 mg total) by mouth daily with breakfast for 3 days.    Dispense:  3 tablet    Refill:  0   ketorolac (TORADOL) 30 MG/ML injection 15 mg   diclofenac Sodium (VOLTAREN) 1 % GEL    Sig: Apply 2 g topically 4 (four) times daily for 14 days.    Dispense:  150 g    Refill:  0     Teena Dunk, NP

## 2021-05-22 ENCOUNTER — Other Ambulatory Visit: Payer: Self-pay

## 2021-05-27 ENCOUNTER — Other Ambulatory Visit: Payer: Self-pay

## 2021-05-30 ENCOUNTER — Other Ambulatory Visit: Payer: Self-pay

## 2021-06-06 ENCOUNTER — Other Ambulatory Visit: Payer: Self-pay

## 2021-06-09 ENCOUNTER — Telehealth: Payer: Self-pay | Admitting: Clinical

## 2021-06-09 NOTE — Telephone Encounter (Signed)
Integrated Behavioral Health General Follow Up Note  06/09/2021 Name: Wanda Weaver MRN: [id number] DOB: [birth year] Wanda Weaver is a 61 y.o. year old female who sees Barbette Merino, NP for primary care. LCSW was consulted to assess patient's needs and assist the patient with Financial Difficulties related to inadequate health insurance and low income .  Interpreter: No.   Interpreter Name & Language: none  Assessment: Patient experiencing financial difficulties related to inadequate health insurance and low income.  Ongoing Intervention: Today CSW called patient to follow up on applications and referral forms sent last month. Patient is working on these, she needs help getting some of her documents printed out. CSW to assist with this. Advised patient to call when [employer] application completed for assistance in scheduling with [employer].  Review of patient status, including review of consultants reports, relevant laboratory and other test results, and collaboration with appropriate care team members and the patient's provider was performed as part of comprehensive patient evaluation and provision of services.    Abigail Butts, LCSW [facility] [phone]

## 2021-06-25 ENCOUNTER — Ambulatory Visit: Payer: Medicaid Other | Admitting: Orthopedic Surgery

## 2021-06-27 ENCOUNTER — Other Ambulatory Visit: Payer: Self-pay

## 2021-06-30 ENCOUNTER — Telehealth: Payer: Self-pay

## 2021-07-01 NOTE — Telephone Encounter (Signed)
I notified patient per provider discontinue medication and restart lisinopril ?

## 2021-07-02 ENCOUNTER — Other Ambulatory Visit: Payer: Self-pay

## 2021-07-02 ENCOUNTER — Other Ambulatory Visit: Payer: Self-pay | Admitting: Nurse Practitioner

## 2021-07-02 DIAGNOSIS — Z760 Encounter for issue of repeat prescription: Secondary | ICD-10-CM

## 2021-07-02 DIAGNOSIS — I10 Essential (primary) hypertension: Secondary | ICD-10-CM

## 2021-07-03 ENCOUNTER — Telehealth: Payer: Self-pay

## 2021-07-03 ENCOUNTER — Other Ambulatory Visit: Payer: Self-pay

## 2021-07-03 NOTE — Telephone Encounter (Signed)
Lisinopril 

## 2021-07-04 ENCOUNTER — Other Ambulatory Visit: Payer: Self-pay | Admitting: Nurse Practitioner

## 2021-07-04 ENCOUNTER — Other Ambulatory Visit: Payer: Self-pay

## 2021-07-04 DIAGNOSIS — Z760 Encounter for issue of repeat prescription: Secondary | ICD-10-CM

## 2021-07-04 DIAGNOSIS — I10 Essential (primary) hypertension: Secondary | ICD-10-CM

## 2021-07-04 MED ORDER — LISINOPRIL 20 MG PO TABS
20.0000 mg | ORAL_TABLET | Freq: Every day | ORAL | 1 refills | Status: DC
  Filled 2021-07-04: qty 30, 30d supply, fill #0

## 2021-07-04 NOTE — Progress Notes (Unsigned)
   [facility] [address] [place], [place]  [address] Phone:  [phone]   Fax:  [phone] 

## 2021-07-07 ENCOUNTER — Telehealth: Payer: Self-pay | Admitting: Clinical

## 2021-07-07 ENCOUNTER — Other Ambulatory Visit: Payer: Self-pay

## 2021-07-07 ENCOUNTER — Other Ambulatory Visit: Payer: Self-pay | Admitting: Nurse Practitioner

## 2021-07-07 DIAGNOSIS — I10 Essential (primary) hypertension: Secondary | ICD-10-CM

## 2021-07-07 MED ORDER — METOPROLOL-HYDROCHLOROTHIAZIDE 50-25 MG PO TABS
1.0000 | ORAL_TABLET | Freq: Every day | ORAL | 0 refills | Status: DC
  Filled 2021-07-07: qty 30, 30d supply, fill #0

## 2021-07-07 NOTE — Telephone Encounter (Signed)
Integrated Behavioral Health ?General Follow Up Note ? ?07/07/2021 ?Name: Geisha Bogenschutz MRN: [id number] DOB: 1960-10-17 ?Keshuna High is a 61 y.o. year old female who sees Passmore, Jake Church I, NP for primary care. LCSW was consulted to assess patient's needs and assist the patient with Financial Difficulties related to inadequate health insurance and low income . ? ?Interpreter: No.   Interpreter Name & Language: none ? ?Assessment: Patient experiencing financial difficulties related to inadequate health insurance and low income. ? ?Ongoing Intervention: Today CSW returned patient call from Friday regarding a problem getting one of her medications refilled. Due to complexity of this and patient having changed providers recently, deferred this issue to Engineer, building services.  ? ?Patient also asked about assistance with an eye exam. Discussed referral to [facility] for free eye exam, as patient is uninsured. Mailed patient [employer] [facility] application to sign and return. ? ?Review of patient status, including review of consultants reports, relevant laboratory and other test results, and collaboration with appropriate care team members and the patient's provider was performed as part of comprehensive patient evaluation and provision of services.   ? ?Estanislado Emms, LCSW ?Patient [place] [facility] ?[phone] ?  ? ?

## 2021-07-08 ENCOUNTER — Other Ambulatory Visit: Payer: Self-pay

## 2021-07-11 ENCOUNTER — Other Ambulatory Visit: Payer: Self-pay

## 2021-07-14 ENCOUNTER — Telehealth: Payer: Self-pay | Admitting: Nurse Practitioner

## 2021-07-14 ENCOUNTER — Other Ambulatory Visit: Payer: Self-pay

## 2021-07-14 NOTE — Telephone Encounter (Signed)
Patient left VM on nurse line - the new BP med is causing her blood sugar to go over 400. She requests a call back ASAP. ?

## 2021-07-14 NOTE — Telephone Encounter (Signed)
Left vm for the patient to call the office back.

## 2021-07-28 ENCOUNTER — Ambulatory Visit: Payer: Medicaid Other | Admitting: Orthopedic Surgery

## 2021-08-08 ENCOUNTER — Ambulatory Visit: Payer: Self-pay | Admitting: Nurse Practitioner

## 2021-08-12 ENCOUNTER — Other Ambulatory Visit: Payer: Self-pay

## 2021-08-22 ENCOUNTER — Other Ambulatory Visit: Payer: Self-pay

## 2021-08-28 ENCOUNTER — Telehealth: Payer: Self-pay | Admitting: Clinical

## 2021-08-28 NOTE — Telephone Encounter (Signed)
Integrated Behavioral Health General Follow Up Note  08/28/2021 Name: Wanda Weaver MRN: [id number] DOB: 1960/07/08 Wanda Weaver is a 61 y.o. year old female who sees Passmore, Enid Derry I, NP for primary care. LCSW was consulted to assess patient's needs and assist the patient with Financial Difficulties related to inadequate health insurance and low income .  Interpreter: No.   Interpreter Name & Language: none  Assessment: Patient experiencing financial difficulties related to inadequate health insurance and low income.  Ongoing Intervention: Today CSW called patient to follow up on [facility] application. She had sent back the one she received, but CSW has not received that returned in the mail. CSW mailed new one 08/14/21, but patient did not receive that one. Planned with patient for patient to come up to the [facility]) office and sign application on her next day off work. CSW to assist with submitting this.   Review of patient status, including review of consultants reports, relevant laboratory and other test results, and collaboration with appropriate care team members and the patient's provider was performed as part of comprehensive patient evaluation and provision of services.    Abigail Butts, LCSW [facility] [phone]

## 2021-08-31 ENCOUNTER — Emergency Department (HOSPITAL_COMMUNITY)
Admission: EM | Admit: 2021-08-31 | Discharge: 2021-08-31 | Disposition: A | Discharge status: Home / Self Care | Payer: Self-pay | Attending: Emergency Medicine | Admitting: Emergency Medicine

## 2021-08-31 ENCOUNTER — Emergency Department (HOSPITAL_COMMUNITY): Payer: Self-pay

## 2021-08-31 ENCOUNTER — Encounter (HOSPITAL_COMMUNITY): Payer: Self-pay | Admitting: Emergency Medicine

## 2021-08-31 DIAGNOSIS — I10 Essential (primary) hypertension: Secondary | ICD-10-CM | POA: Insufficient documentation

## 2021-08-31 DIAGNOSIS — Z794 Long term (current) use of insulin: Secondary | ICD-10-CM | POA: Insufficient documentation

## 2021-08-31 DIAGNOSIS — E119 Type 2 diabetes mellitus without complications: Secondary | ICD-10-CM | POA: Insufficient documentation

## 2021-08-31 DIAGNOSIS — M19011 Primary osteoarthritis, right shoulder: Secondary | ICD-10-CM | POA: Insufficient documentation

## 2021-08-31 DIAGNOSIS — Z7984 Long term (current) use of oral hypoglycemic drugs: Secondary | ICD-10-CM | POA: Insufficient documentation

## 2021-08-31 DIAGNOSIS — Z79899 Other long term (current) drug therapy: Secondary | ICD-10-CM | POA: Insufficient documentation

## 2021-08-31 MED ORDER — DIAZEPAM 5 MG PO TABS
5.0000 mg | ORAL_TABLET | Freq: Once | ORAL | Status: AC
  Administered 2021-08-31: 5 mg via ORAL
  Filled 2021-08-31: qty 1

## 2021-08-31 MED ORDER — IBUPROFEN 600 MG PO TABS
600.0000 mg | ORAL_TABLET | Freq: Four times a day (QID) | ORAL | 0 refills | Status: DC | PRN
Start: 2021-08-31 — End: 2022-04-22
  Filled 2021-08-31: qty 30, 8d supply, fill #0

## 2021-08-31 MED ORDER — CYCLOBENZAPRINE HCL 10 MG PO TABS
10.0000 mg | ORAL_TABLET | Freq: Two times a day (BID) | ORAL | 0 refills | Status: DC | PRN
  Filled 2021-08-31 – 2021-09-01 (×2): qty 20, 10d supply, fill #0

## 2021-08-31 NOTE — ED Notes (Signed)
Sling applied to the the right arm

## 2021-08-31 NOTE — Discharge Instructions (Signed)
You have been experiencing pain about your right shoulder which could likely be due to her osteoarthritis.  You may use the sling only as needed for support do not wear the sling for more than a few hours as it can cause frozen shoulder.  Take medication prescribed as needed for symptom control and you may follow-up with orthopedist if symptoms worsen.  Return if you have any concern.

## 2021-08-31 NOTE — ED Provider Notes (Signed)
[facility] DEPT Provider Note   CSN: [id number] Arrival date & time: 08/31/21  3403     History  Chief Complaint  Patient presents with   Arm Pain    Wanda Weaver is a 61 y.o. female.  The history is provided by the patient and a significant other. No language interpreter was used.  Arm Pain   61 year old female significant history of diabetes, hypertension, hyperlipidemia, presenting with complaints of arm pain.  Patient reports she has been experiencing pain to her right shoulder ongoing for the past 2 to 3 days.  She first noticed the pain when she was carrying grocery home.  She described pain more as a cramping sensation to her shoulder worse when she picks arm above her head with movement.  Sometimes she hears some cracking sounds with movement.  She continues throughout her day and she has been using some Aleve arthritis cream as well as taking Tylenol but the pain still persist.  Pain is moderate in severity.  There are no associated neck pain, chest pain, trouble breathing, lightheadedness, dizziness, nausea, diaphoresis and no exertional chest discomfort.  She denies any specific injury.  She is right-hand dominant.  Home Medications Prior to Admission medications   Medication Sig Start Date End Date Taking? Authorizing Provider  albuterol (VENTOLIN HFA) 108 (90 Base) MCG/ACT inhaler Inhale 2 puffs into the lungs every 6 (six) hours as needed for wheezing or shortness of breath. 05/09/21   [name], NP  Blood Glucose Monitoring Suppl (CAREONE BLOOD GLUCOSE SYSTEM) w/Device KIT QID prn 05/09/21   [name], NP  Elastic Bandages & Supports (KNEE BRACE ADJUSTABLE HINGED) MISC 1 application by Does not apply route daily. 05/09/21   [name], NP  glipiZIDE (GLUCOTROL) 5 MG tablet Take 1 tablet (5 mg total) by mouth 2 (two) times daily before a meal. 05/19/21   Passmore, Jake Church I, NP  ibuprofen (ADVIL) 800 MG tablet Take 1 tablet (800 mg  total) by mouth every 8 (eight) hours as needed. 03/19/20   Azzie Glatter, FNP  insulin glargine (LANTUS) 100 UNIT/ML injection Inject 0.6 mLs (60 Units total) into the skin at bedtime. 05/19/21 09/15/21  Bo Merino I, NP  Insulin Syringe-Needle U-100 (BD INSULIN SYRINGE ULTRAFINE) 31G X 5/16" 0.5 ML MISC use to inject 50 units of insulin daily 10/08/20   [name], NP  Lancets 30G MISC 1 each by Does not apply route as directed. 08/23/17   Dorena Dew, FNP  metFORMIN (GLUCOPHAGE) 500 MG tablet Take 2 tablets (1,000 mg total) by mouth 2 (two) times daily with a meal. 07/22/20   Azzie Glatter, FNP  metoprolol-hydrochlorothiazide (LOPRESSOR HCT) 50-25 MG tablet Take 1 tablet by mouth daily. 07/07/21 08/10/21  Bo Merino I, NP  Multiple Vitamin (MULTIVITAMIN) tablet Take 1 tablet by mouth daily.    [provider]  simvastatin (ZOCOR) 20 MG tablet Take 1 tablet (20 mg total) by mouth every evening. Patient not taking: Reported on 05/19/2021 01/20/21 01/20/22  [name], NP      Allergies    Patient has no known allergies.    Review of Systems   Review of Systems  All other systems reviewed and are negative.  Physical Exam Updated Vital Signs BP (!) 195/96 (BP Location: Right Arm)   Pulse 83   Temp 98.2 F (36.8 C) (Oral)   Resp 18   Ht $R'5\' 2"'Ba$  (1.575 m)   Wt 86.2 kg  SpO2 100%   BMI 34.75 kg/m  Physical Exam Vitals and nursing note reviewed.  Constitutional:      General: She is not in acute distress.    Appearance: She is well-developed.  HENT:     Head: Atraumatic.  Eyes:     Conjunctiva/sclera: Conjunctivae normal.  Cardiovascular:     Rate and Rhythm: Normal rate and regular rhythm.     Pulses: Normal pulses.     Heart sounds: Normal heart sounds.  Pulmonary:     Effort: Pulmonary effort is normal.     Breath sounds: No wheezing or rhonchi.  Musculoskeletal:        General: Tenderness (Right shoulder: Tenderness along the lateral  deltoid and the head of the triceps on palpation.  Having difficulty raising arm above head without any obvious deformity.  No overlying skin changes and no elbow pain.) present.     Cervical back: Neck supple.  Skin:    Findings: No rash.  Neurological:     Mental Status: She is alert.  Psychiatric:        Mood and Affect: Mood normal.    ED Results / Procedures / Treatments   Labs (all labs ordered are listed, but only abnormal results are displayed) Labs Reviewed - No data to display  EKG None  Radiology DG Shoulder Right  Result Date: 08/31/2021 CLINICAL DATA:  61 year old female with history of right-sided shoulder pain. EXAM: RIGHT SHOULDER - 2+ VIEW COMPARISON:  No priors. FINDINGS: There is no evidence of fracture or dislocation. Mild degenerative changes of osteoarthritis are noted in the glenohumeral and acromioclavicular joints. There is no focal bone abnormality. Soft tissues are unremarkable. IMPRESSION: 1. No acute radiographic abnormality of the right shoulder. 2. 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 Electronically Signed   By: Vinnie Langton M.D.   On: 08/31/2021 07:51    Procedures Procedures    Medications Ordered in ED Medications  diazepam (VALIUM) tablet 5 mg (5 mg Oral Given 08/31/21 0805)    ED Course/ Medical Decision Making/ A&P                           Medical Decision Making  BP (!) 176/78   Pulse 74   Temp 98.2 F (36.8 C) (Oral)   Resp 18   Ht $R'5\' 2"'KP$  (1.575 m)   Wt 86.2 kg   SpO2 98%   BMI 34.75 kg/m   7:11 AM This is a 61 year old female with complaint of right shoulder pain worse with movement ongoing for the past 2 to 3 days.  Pain resolved at rest but worse when she moves her shoulder with some restrictions of her range of motion.  She felt like it is a pulled muscle but states that it has not really relieved with her home over-the-counter treatment such as Aleve cream and  Tylenol.  On exam, she does have reproducible tenderness along the lateral deltoid and the head of the triceps on her right shoulder without any deformity noted.  She has pain with active range of motion especially when she tries to raise her arm above her head but she is able to provide full range of motion.  She does not have any overlying skin changes to suggest cellulitis and no significant swelling noted.  No obvious signs to suggest shoulder dislocation.  Her elbow was normal on exam.  She is neurovascular intact.  She does not have  any cervical spine tenderness to suggest referred pain.  Her pain is not consistent with ACS as she denies any classic symptoms such as lightheadedness, dizziness, chest pain, shortness of breath, diaphoresis or nausea or exertional chest pain.  She was noted to be hypertensive initially and states she just took her home blood pressure medication prior to arrival.  I suspect her elevated blood pressure is likely due to pain.  I have considered cardiac work-up but felt that it is not consistent with patient's presentation given her reproducible shoulder pain and the lack of corresponding cardiac related complaint.  I have low suspicion for for cervical radiculopathy as patient does not have any tenderness about her neck.  X-ray of the right shoulder has been ordered, Valium given as muscle relaxant.  8:32 AM X-ray of the right shoulder was obtained, independently visualized and interpreted by me and agree with radiologist interpretation.  No acute abnormalities about the right shoulder but there are evidence of some degenerative change of osteoarthritis at the [facility] joint and glenohumeral joints.  I suspect this is consistent with patient's presentation.  I will provide patient with symptomatic care as well as return precaution.  I felt that no indication for hospital admission at this time and patient can be safely discharged home.  I have also provided a sling for support but  encourage patient to perform regular range of motion about the right shoulder to decrease risks of adhesive capsulitis.        Final Clinical Impression(s) / ED Diagnoses Final diagnoses:  Osteoarthritis of right shoulder, unspecified osteoarthritis type    Rx / DC Orders ED Discharge Orders          Ordered    ibuprofen (ADVIL) 600 MG tablet  Every 6 hours PRN        08/31/21 0841    cyclobenzaprine (FLEXERIL) 10 MG tablet  2 times daily PRN        08/31/21 0841              [name], PA-C 08/31/21 2263    Lucrezia Starch, MD 08/31/21 1016

## 2021-08-31 NOTE — ED Triage Notes (Addendum)
Pt reports pain in her R upper arm since Friday. Worse with movement up and down. States that it causes a cracking noise in her shoulder. Denies fall or known injury. States that she did work and go to the store that day and could have overused it. Has tried aleve and tylenol without relief. States that she just took her HTN meds about 10 mins ago.

## 2021-09-01 ENCOUNTER — Other Ambulatory Visit: Payer: Self-pay | Admitting: Nurse Practitioner

## 2021-09-01 ENCOUNTER — Telehealth: Payer: Self-pay | Admitting: Nurse Practitioner

## 2021-09-01 ENCOUNTER — Other Ambulatory Visit: Payer: Self-pay

## 2021-09-01 DIAGNOSIS — Z794 Long term (current) use of insulin: Secondary | ICD-10-CM

## 2021-09-01 DIAGNOSIS — I10 Essential (primary) hypertension: Secondary | ICD-10-CM

## 2021-09-01 MED ORDER — METOPROLOL-HYDROCHLOROTHIAZIDE 50-25 MG PO TABS
1.0000 | ORAL_TABLET | Freq: Every day | ORAL | 0 refills | Status: DC
  Filled 2021-09-01: qty 30, 30d supply, fill #0

## 2021-09-01 NOTE — Telephone Encounter (Signed)
Metformin and blood pressure meds refill request

## 2021-09-02 ENCOUNTER — Other Ambulatory Visit: Payer: Self-pay

## 2021-09-02 MED ORDER — METFORMIN HCL 500 MG PO TABS
1000.0000 mg | ORAL_TABLET | Freq: Two times a day (BID) | ORAL | 11 refills | Status: DC
  Filled 2021-09-02: qty 120, 30d supply, fill #0
  Filled 2021-11-05: qty 120, 30d supply, fill #1

## 2021-09-05 ENCOUNTER — Other Ambulatory Visit: Payer: Self-pay

## 2021-09-12 ENCOUNTER — Telehealth: Payer: Self-pay | Admitting: Clinical

## 2021-09-12 NOTE — Telephone Encounter (Signed)
[facility] General Follow Up Note  09/12/2021 Name: Wanda Weaver MRN: [id number] DOB: 1960/09/28 Wanda Weaver is a 61 y.o. year old female who sees Passmore, Enid Derry I, NP for primary care. LCSW was consulted to assess patient's needs and assist the patient with Financial Difficulties related to inadequate health insurance and low income .  Interpreter: No.   Interpreter Name & Language: none  Assessment: Patient experiencing financial difficulties related to inadequate health insurance and low income.  Ongoing Intervention: Patient did not come to [facility]) to sign [facility] [place] application as planned on 09/04/21. Called and LVM for patient on 5/30 and today, 6/2. Awaiting return call. CSW available from clinic to assist with [facility] [place] referral for eye exam as needed.  Review of patient status, including review of consultants reports, relevant laboratory and other test results, and collaboration with appropriate care team members and the patient's provider was performed as part of comprehensive patient evaluation and provision of services.    Abigail Butts, LCSW [facility] [phone]

## 2021-09-16 ENCOUNTER — Telehealth: Payer: Self-pay | Admitting: Clinical

## 2021-09-16 NOTE — Telephone Encounter (Signed)
[facility] General Follow Up Note  09/16/2021 Name: Wanda Weaver MRN: [id number] DOB: 1960/12/28 [name] is a 61 y.o. year old female who sees Passmore, Jake Church I, NP for primary care. LCSW was consulted to assess patient's needs and assist the patient with Financial Difficulties related to inadequate health insurance and low income .  Interpreter: No.   Interpreter Name & Language: none  Assessment: Patient experiencing financial difficulties related to inadequate health insurance and low income.  Ongoing Intervention: Patient returned CSW's calls and indicated she had recently had to go to the ER and was under a lot of stress. She is still interested in the [facility] [place] program for an eye exam. CSW to mail out application again for patient to sign and return.  Review of patient status, including review of consultants reports, relevant laboratory and other test results, and collaboration with appropriate care team members and the patient's provider was performed as part of comprehensive patient evaluation and provision of services.    Estanislado Emms, [facility] [phone]

## 2021-09-17 ENCOUNTER — Other Ambulatory Visit: Payer: Self-pay | Admitting: Nurse Practitioner

## 2021-09-17 DIAGNOSIS — E118 Type 2 diabetes mellitus with unspecified complications: Secondary | ICD-10-CM

## 2021-09-18 ENCOUNTER — Other Ambulatory Visit: Payer: Self-pay

## 2021-09-19 ENCOUNTER — Other Ambulatory Visit: Payer: Self-pay

## 2021-09-24 ENCOUNTER — Telehealth: Payer: Self-pay | Admitting: Nurse Practitioner

## 2021-09-24 ENCOUNTER — Other Ambulatory Visit: Payer: Self-pay

## 2021-09-24 ENCOUNTER — Other Ambulatory Visit: Payer: Self-pay | Admitting: Nurse Practitioner

## 2021-09-24 DIAGNOSIS — Z794 Long term (current) use of insulin: Secondary | ICD-10-CM

## 2021-09-24 MED ORDER — INSULIN GLARGINE 100 UNIT/ML ~~LOC~~ SOLN
60.0000 [IU] | Freq: Every day | SUBCUTANEOUS | 2 refills | Status: DC
  Filled 2021-09-24: qty 20, 33d supply, fill #0
  Filled 2021-10-27: qty 20, 33d supply, fill #1

## 2021-09-24 NOTE — Telephone Encounter (Signed)
Lantus refill request.  

## 2021-09-26 ENCOUNTER — Telehealth: Payer: Self-pay

## 2021-09-26 ENCOUNTER — Other Ambulatory Visit: Payer: Self-pay

## 2021-09-26 NOTE — Telephone Encounter (Signed)
Lantus

## 2021-09-29 NOTE — Telephone Encounter (Signed)
Patient has not been seen since February and will need to be seen in the office before refills are given.

## 2021-10-02 ENCOUNTER — Other Ambulatory Visit: Payer: Self-pay | Admitting: Nurse Practitioner

## 2021-10-02 DIAGNOSIS — I10 Essential (primary) hypertension: Secondary | ICD-10-CM

## 2021-10-03 ENCOUNTER — Other Ambulatory Visit: Payer: Self-pay

## 2021-10-03 MED ORDER — METOPROLOL-HYDROCHLOROTHIAZIDE 50-25 MG PO TABS
1.0000 | ORAL_TABLET | Freq: Every day | ORAL | 0 refills | Status: DC
  Filled 2021-10-03: qty 30, 30d supply, fill #0

## 2021-10-06 ENCOUNTER — Other Ambulatory Visit: Payer: Self-pay

## 2021-10-07 ENCOUNTER — Other Ambulatory Visit: Payer: Self-pay

## 2021-10-27 ENCOUNTER — Other Ambulatory Visit: Payer: Self-pay

## 2021-10-28 ENCOUNTER — Telehealth: Payer: Self-pay

## 2021-10-28 ENCOUNTER — Other Ambulatory Visit: Payer: Self-pay

## 2021-10-28 DIAGNOSIS — Z794 Long term (current) use of insulin: Secondary | ICD-10-CM

## 2021-10-28 NOTE — Telephone Encounter (Signed)
Lantus

## 2021-10-29 ENCOUNTER — Other Ambulatory Visit: Payer: Self-pay

## 2021-10-29 MED ORDER — LANCETS 30G MISC
1.0000 | 12 refills | Status: DC
  Filled 2021-10-29: qty 100, 25d supply, fill #0

## 2021-10-29 MED ORDER — INSULIN GLARGINE 100 UNIT/ML ~~LOC~~ SOLN
60.0000 [IU] | Freq: Every day | SUBCUTANEOUS | 2 refills | Status: DC
  Filled 2021-10-29: qty 20, 33d supply, fill #0

## 2021-11-03 ENCOUNTER — Other Ambulatory Visit: Payer: Self-pay

## 2021-11-05 ENCOUNTER — Ambulatory Visit (INDEPENDENT_AMBULATORY_CARE_PROVIDER_SITE_OTHER): Payer: Self-pay | Admitting: Nurse Practitioner

## 2021-11-05 ENCOUNTER — Encounter: Payer: Self-pay | Admitting: Nurse Practitioner

## 2021-11-05 ENCOUNTER — Other Ambulatory Visit: Payer: Self-pay

## 2021-11-05 VITALS — BP 141/70 | HR 69 | Temp 97.6°F | Ht 62.0 in | Wt 206.0 lb

## 2021-11-05 DIAGNOSIS — E118 Type 2 diabetes mellitus with unspecified complications: Secondary | ICD-10-CM

## 2021-11-05 DIAGNOSIS — M79605 Pain in left leg: Secondary | ICD-10-CM

## 2021-11-05 DIAGNOSIS — M79604 Pain in right leg: Secondary | ICD-10-CM

## 2021-11-05 DIAGNOSIS — Z794 Long term (current) use of insulin: Secondary | ICD-10-CM

## 2021-11-05 DIAGNOSIS — I10 Essential (primary) hypertension: Secondary | ICD-10-CM

## 2021-11-05 LAB — POCT GLYCOSYLATED HEMOGLOBIN (HGB A1C)
HbA1c POC (<> result, manual entry): 8.9 % (ref 4.0–5.6)
HbA1c, POC (controlled diabetic range): 8.9 % — AB (ref 0.0–7.0)
HbA1c, POC (prediabetic range): 8.9 % — AB (ref 5.7–6.4)
Hemoglobin A1C: 8.9 % — AB (ref 4.0–5.6)

## 2021-11-05 MED ORDER — METFORMIN HCL 500 MG PO TABS
1000.0000 mg | ORAL_TABLET | Freq: Two times a day (BID) | ORAL | 11 refills | Status: DC
  Filled 2022-01-05: qty 120, 30d supply, fill #0

## 2021-11-05 MED ORDER — INSULIN GLARGINE 100 UNIT/ML ~~LOC~~ SOLN
60.0000 [IU] | Freq: Every day | SUBCUTANEOUS | 2 refills | Status: DC
  Filled 2021-11-05: qty 18, 30d supply, fill #0
  Filled 2021-11-26: qty 20, 33d supply, fill #0
  Filled 2021-12-22: qty 20, 33d supply, fill #1
  Filled 2022-01-26 – 2022-01-27 (×2): qty 10, 16d supply, fill #2
  Filled 2022-02-10: qty 10, 16d supply, fill #3

## 2021-11-05 MED ORDER — GLIPIZIDE 5 MG PO TABS
5.0000 mg | ORAL_TABLET | Freq: Two times a day (BID) | ORAL | 3 refills | Status: DC
  Filled 2021-11-05: qty 60, 30d supply, fill #0

## 2021-11-05 MED ORDER — SIMVASTATIN 20 MG PO TABS
20.0000 mg | ORAL_TABLET | Freq: Every evening | ORAL | 11 refills | Status: DC
  Filled 2021-11-05: qty 30, 30d supply, fill #0

## 2021-11-05 MED ORDER — MELOXICAM 7.5 MG PO TABS
7.5000 mg | ORAL_TABLET | Freq: Every day | ORAL | 0 refills | Status: DC
  Filled 2021-11-05: qty 30, 30d supply, fill #0

## 2021-11-05 MED ORDER — METOPROLOL-HYDROCHLOROTHIAZIDE 50-25 MG PO TABS
1.0000 | ORAL_TABLET | Freq: Every day | ORAL | 0 refills | Status: DC
  Filled 2021-11-05: qty 30, 30d supply, fill #0

## 2021-11-05 MED ORDER — CYCLOBENZAPRINE HCL 10 MG PO TABS
10.0000 mg | ORAL_TABLET | Freq: Two times a day (BID) | ORAL | 0 refills | Status: DC | PRN
Start: 2021-11-05 — End: 2022-05-08
  Filled 2021-11-05: qty 20, 10d supply, fill #0

## 2021-11-05 NOTE — Progress Notes (Signed)
$'@Patient'A$  ID: Wanda Weaver, female    DOB: [birth year], 61 y.o.   MRN: [id number]  Chief Complaint  Patient presents with   Follow-up    Pt is here for  DM follow up.  Patient states both legs hurts she was told by the ED doctor she has osteoarthritis. Pt is requesting refill on all medications    Referring provider: No ref. provider found   HPI  Wanda Weaver 61 y.o. female  has a past medical history of Bilateral lower extremity edema (07/2019), Bilateral lower extremity pain (07/2019), Diabetes mellitus without complication ([place]), Hemoglobin A1C greater than 9%, indicating poor diabetic control, Hyperlipidemia, Hypertension, and Vitamin D deficiency.  To the [facility] for right knee pain.  Patient presents today for follow-up visit for diabetes.  She does need medications refilled today.  She states that she has been out of her insulin for about a week now.  Patient does complain today of bilateral calf pain.  She was previously referred to vascular but cannot afford appointments due to financial concerns.  We will give her financial assistance paperwork today. Denies f/c/s, n/v/d, hemoptysis, PND, leg swelling Denies chest pain or edema    No Known Allergies  Immunization History  Administered Date(s) Administered   Pneumococcal Polysaccharide-23 08/06/2017    Past Medical History:  Diagnosis Date   Bilateral lower extremity edema 07/2019   Bilateral lower extremity pain 07/2019   Diabetes mellitus without complication (HCC)    Hemoglobin A1C greater than 9%, indicating poor diabetic control    Hyperlipidemia    Hypertension    Vitamin D deficiency     Tobacco History: Social History   Tobacco Use  Smoking Status Never  Smokeless Tobacco Never   Counseling given: Not Answered   Outpatient Encounter Medications as of 11/05/2021  Medication Sig   albuterol (VENTOLIN HFA) 108 (90 Base) MCG/ACT inhaler Inhale 2 puffs into the lungs every 6 (six) hours as needed for wheezing or  shortness of breath.   Blood Glucose Monitoring Suppl (CAREONE BLOOD GLUCOSE SYSTEM) w/Device KIT QID prn   Elastic Bandages & Supports (KNEE BRACE ADJUSTABLE HINGED) MISC 1 application by Does not apply route daily.   ibuprofen (ADVIL) 600 MG tablet Take 1 tablet (600 mg total) by mouth every 6 (six) hours as needed.   Insulin Syringe-Needle U-100 (BD INSULIN SYRINGE ULTRAFINE) 31G X 5/16" 0.5 ML MISC use to inject 50 units of insulin daily   Lancets 30G MISC use as directed   meloxicam (MOBIC) 7.5 MG tablet Take 1 tablet (7.5 mg total) by mouth daily.   Multiple Vitamin (MULTIVITAMIN) tablet Take 1 tablet by mouth daily.   [DISCONTINUED] cyclobenzaprine (FLEXERIL) 10 MG tablet Take 1 tablet (10 mg total) by mouth 2 (two) times daily as needed for muscle spasms.   [DISCONTINUED] insulin glargine (LANTUS) 100 UNIT/ML injection Inject 0.6 mLs (60 Units total) into the skin at bedtime.   [DISCONTINUED] metFORMIN (GLUCOPHAGE) 500 MG tablet Take 2 tablets (1,000 mg total) by mouth 2 (two) times daily with a meal.   [DISCONTINUED] metoprolol-hydrochlorothiazide (LOPRESSOR HCT) 50-25 MG tablet Take 1 tablet by mouth once daily.   cyclobenzaprine (FLEXERIL) 10 MG tablet Take 1 tablet (10 mg total) by mouth 2 (two) times daily as needed for muscle spasms.   glipiZIDE (GLUCOTROL) 5 MG tablet Take 1 tablet (5 mg total) by mouth 2 (two) times daily before a meal.   insulin glargine (LANTUS) 100 UNIT/ML injection Inject 0.6 mLs (60 Units total) into the skin  at bedtime.   metFORMIN (GLUCOPHAGE) 500 MG tablet Take 2 tablets (1,000 mg total) by mouth 2 (two) times daily with a meal.   metoprolol-hydrochlorothiazide (LOPRESSOR HCT) 50-25 MG tablet Take 1 tablet by mouth once daily.   simvastatin (ZOCOR) 20 MG tablet Take 1 tablet (20 mg total) by mouth every evening.   [DISCONTINUED] glipiZIDE (GLUCOTROL) 5 MG tablet Take 1 tablet (5 mg total) by mouth 2 (two) times daily before a meal. (Patient not taking:  Reported on 11/05/2021)   [DISCONTINUED] simvastatin (ZOCOR) 20 MG tablet Take 1 tablet (20 mg total) by mouth every evening. (Patient not taking: Reported on 05/19/2021)   No facility-administered encounter medications on file as of 11/05/2021.     Review of Systems  Review of Systems  Constitutional: Negative.   HENT: Negative.    Cardiovascular: Negative.   Gastrointestinal: Negative.   Allergic/Immunologic: Negative.   Neurological: Negative.   Psychiatric/Behavioral: Negative.         Physical Exam  BP (!) 141/70 (BP Location: Left Arm, Patient Position: Sitting, Cuff Size: Large)   Pulse 69   Temp 97.6 F (36.4 C)   Ht $R'5\' 2"'lU$  (1.575 m)   Wt 206 lb (93.4 kg)   SpO2 100%   BMI 37.68 kg/m   Wt Readings from Last 5 Encounters:  11/05/21 206 lb (93.4 kg)  08/31/21 190 lb (86.2 kg)  05/19/21 203 lb 8 oz (92.3 kg)  05/09/21 209 lb 3.2 oz (94.9 kg)  01/16/21 206 lb (93.4 kg)     Physical Exam Vitals and nursing note reviewed.  Constitutional:      General: She is not in acute distress.    Appearance: She is well-developed.  Cardiovascular:     Rate and Rhythm: Normal rate and regular rhythm.  Pulmonary:     Effort: Pulmonary effort is normal.     Breath sounds: Normal breath sounds.  Neurological:     Mental Status: She is alert and oriented to person, place, and time.      Lab Results:  CBC    Component Value Date/Time   WBC 4.7 08/12/2020 0555   RBC 4.27 08/12/2020 0555   HGB 12.0 08/12/2020 0555   HGB 13.2 07/17/2020 1108   HCT 36.6 08/12/2020 0555   HCT 38.8 07/17/2020 1108   PLT 193 08/12/2020 0555   PLT 238 07/17/2020 1108   MCV 85.7 08/12/2020 0555   MCV 83 07/17/2020 1108   MCH 28.1 08/12/2020 0555   MCHC 32.8 08/12/2020 0555   RDW 13.2 08/12/2020 0555   RDW 13.2 07/17/2020 1108   LYMPHSABS 1.7 07/17/2020 1108   EOSABS 0.6 (H) 07/17/2020 1108   BASOSABS 0.1 07/17/2020 1108    BMET    Component Value Date/Time   NA 143 05/09/2021  0921   K 4.6 05/09/2021 0921   CL 106 05/09/2021 0921   CO2 24 08/12/2020 0555   GLUCOSE 164 (H) 05/09/2021 0921   GLUCOSE 116 (H) 08/12/2020 0555   BUN 17 05/09/2021 0921   CREATININE 0.88 05/09/2021 0921   CALCIUM 9.1 05/09/2021 0921   GFRNONAA >60 08/12/2020 0555   GFRAA 98 06/30/2019 1544    BNP No results found for: "BNP"  ProBNP No results found for: "PROBNP"  Imaging: No results found.   Assessment & Plan:   Type 2 diabetes mellitus with complication, with long-term current use of insulin (HCC) - POCT glycosylated hemoglobin (Hb A1C) - metFORMIN (GLUCOPHAGE) 500 MG tablet; Take 2 tablets (1,000 mg total)  by mouth 2 (two) times daily with a meal.  Dispense: 120 tablet; Refill: 11 - glipiZIDE (GLUCOTROL) 5 MG tablet; Take 1 tablet (5 mg total) by mouth 2 (two) times daily before a meal.  Dispense: 60 tablet; Refill: 3 - insulin glargine (LANTUS) 100 UNIT/ML injection; Inject 0.6 mLs (60 Units total) into the skin at bedtime.  Dispense: 18 mL; Refill: 2  2. Bilateral leg pain  - meloxicam (MOBIC) 7.5 MG tablet; Take 1 tablet (7.5 mg total) by mouth daily.  Dispense: 30 tablet; Refill: 0   - patient needs referrals to vascular and orth, but will need financial assistance -may need PT as well  3. Essential hypertension  - metoprolol-hydrochlorothiazide (LOPRESSOR HCT) 50-25 MG tablet; Take 1 tablet by mouth once daily.  Dispense: 30 tablet; Refill: 0  Follow up:  Follow up in 3 months or sooner     [name], NP 11/05/2021

## 2021-11-05 NOTE — Assessment & Plan Note (Signed)
-   POCT glycosylated hemoglobin (Hb A1C) - metFORMIN (GLUCOPHAGE) 500 MG tablet; Take 2 tablets (1,000 mg total) by mouth 2 (two) times daily with a meal.  Dispense: 120 tablet; Refill: 11 - glipiZIDE (GLUCOTROL) 5 MG tablet; Take 1 tablet (5 mg total) by mouth 2 (two) times daily before a meal.  Dispense: 60 tablet; Refill: 3 - insulin glargine (LANTUS) 100 UNIT/ML injection; Inject 0.6 mLs (60 Units total) into the skin at bedtime.  Dispense: 18 mL; Refill: 2  2. Bilateral leg pain  - meloxicam (MOBIC) 7.5 MG tablet; Take 1 tablet (7.5 mg total) by mouth daily.  Dispense: 30 tablet; Refill: 0   - patient needs referrals to vascular and orth, but will need financial assistance -may need PT as well  3. Essential hypertension  - metoprolol-hydrochlorothiazide (LOPRESSOR HCT) 50-25 MG tablet; Take 1 tablet by mouth once daily.  Dispense: 30 tablet; Refill: 0  Follow up:  Follow up in 3 months or sooner

## 2021-11-05 NOTE — Patient Instructions (Addendum)
1. Type 2 diabetes mellitus with complication, with long-term current use of insulin (HCC)  - POCT glycosylated hemoglobin (Hb A1C) - metFORMIN (GLUCOPHAGE) 500 MG tablet; Take 2 tablets (1,000 mg total) by mouth 2 (two) times daily with a meal.  Dispense: 120 tablet; Refill: 11 - glipiZIDE (GLUCOTROL) 5 MG tablet; Take 1 tablet (5 mg total) by mouth 2 (two) times daily before a meal.  Dispense: 60 tablet; Refill: 3 - insulin glargine (LANTUS) 100 UNIT/ML injection; Inject 0.6 mLs (60 Units total) into the skin at bedtime.  Dispense: 18 mL; Refill: 2  2. Bilateral leg pain  - meloxicam (MOBIC) 7.5 MG tablet; Take 1 tablet (7.5 mg total) by mouth daily.  Dispense: 30 tablet; Refill: 0   - patient needs referrals to vascular and orth, but will need financial assistance -may need PT as well  3. Essential hypertension  - metoprolol-hydrochlorothiazide (LOPRESSOR HCT) 50-25 MG tablet; Take 1 tablet by mouth once daily.  Dispense: 30 tablet; Refill: 0  Follow up:  Follow up in 3 months or sooner

## 2021-11-06 ENCOUNTER — Other Ambulatory Visit: Payer: Self-pay

## 2021-11-06 LAB — COMPREHENSIVE METABOLIC PANEL
ALT: 21 IU/L (ref 0–32)
AST: 22 IU/L (ref 0–40)
Albumin/Globulin Ratio: 1.8 (ref 1.2–2.2)
Albumin: 4.2 g/dL (ref 3.8–4.9)
Alkaline Phosphatase: 86 IU/L (ref 44–121)
BUN/Creatinine Ratio: 21 (ref 12–28)
BUN: 21 mg/dL (ref 8–27)
Bilirubin Total: 0.4 mg/dL (ref 0.0–1.2)
CO2: 24 mmol/L (ref 20–29)
Calcium: 9.5 mg/dL (ref 8.7–10.3)
Chloride: 101 mmol/L (ref 96–106)
Creatinine, Ser: 1.02 mg/dL — ABNORMAL HIGH (ref 0.57–1.00)
Globulin, Total: 2.4 g/dL (ref 1.5–4.5)
Glucose: 244 mg/dL — ABNORMAL HIGH (ref 70–99)
Potassium: 4.8 mmol/L (ref 3.5–5.2)
Sodium: 138 mmol/L (ref 134–144)
Total Protein: 6.6 g/dL (ref 6.0–8.5)
eGFR: 63 mL/min/{1.73_m2} (ref >59)

## 2021-11-06 LAB — CBC
Hematocrit: 33.7 % — ABNORMAL LOW (ref 34.0–46.6)
Hemoglobin: 11.3 g/dL (ref 11.1–15.9)
MCH: 28.4 pg (ref 26.6–33.0)
MCHC: 33.5 g/dL (ref 31.5–35.7)
MCV: 85 fL (ref 79–97)
Platelets: 238 10*3/uL (ref 150–450)
RBC: 3.98 x10E6/uL (ref 3.77–5.28)
RDW: 12.2 % (ref 11.7–15.4)
WBC: 5.1 10*3/uL (ref 3.4–10.8)

## 2021-11-07 ENCOUNTER — Other Ambulatory Visit: Payer: Self-pay

## 2021-11-14 ENCOUNTER — Emergency Department (HOSPITAL_COMMUNITY)
Admission: EM | Admit: 2021-11-14 | Discharge: 2021-11-14 | Disposition: A | Discharge status: Home / Self Care | Payer: Medicaid Other | Attending: Emergency Medicine | Admitting: Emergency Medicine

## 2021-11-14 ENCOUNTER — Other Ambulatory Visit: Payer: Self-pay

## 2021-11-14 ENCOUNTER — Encounter (HOSPITAL_COMMUNITY): Payer: Self-pay

## 2021-11-14 ENCOUNTER — Emergency Department (HOSPITAL_COMMUNITY): Payer: Medicaid Other

## 2021-11-14 DIAGNOSIS — N9489 Other specified conditions associated with female genital organs and menstrual cycle: Secondary | ICD-10-CM | POA: Insufficient documentation

## 2021-11-14 DIAGNOSIS — Z7984 Long term (current) use of oral hypoglycemic drugs: Secondary | ICD-10-CM | POA: Insufficient documentation

## 2021-11-14 DIAGNOSIS — E119 Type 2 diabetes mellitus without complications: Secondary | ICD-10-CM | POA: Insufficient documentation

## 2021-11-14 DIAGNOSIS — Z794 Long term (current) use of insulin: Secondary | ICD-10-CM | POA: Insufficient documentation

## 2021-11-14 DIAGNOSIS — Z79899 Other long term (current) drug therapy: Secondary | ICD-10-CM | POA: Insufficient documentation

## 2021-11-14 DIAGNOSIS — I10 Essential (primary) hypertension: Secondary | ICD-10-CM | POA: Insufficient documentation

## 2021-11-14 DIAGNOSIS — J208 Acute bronchitis due to other specified organisms: Secondary | ICD-10-CM | POA: Insufficient documentation

## 2021-11-14 DIAGNOSIS — R0789 Other chest pain: Secondary | ICD-10-CM

## 2021-11-14 DIAGNOSIS — Z20822 Contact with and (suspected) exposure to covid-19: Secondary | ICD-10-CM | POA: Insufficient documentation

## 2021-11-14 LAB — BASIC METABOLIC PANEL
Anion gap: 6 (ref 5–15)
BUN: 23 mg/dL — ABNORMAL HIGH (ref 6–20)
CO2: 26 mmol/L (ref 22–32)
Calcium: 9 mg/dL (ref 8.9–10.3)
Chloride: 105 mmol/L (ref 98–111)
Creatinine, Ser: 1.01 mg/dL — ABNORMAL HIGH (ref 0.44–1.00)
GFR, Estimated: >60 mL/min (ref >60)
Glucose, Bld: 112 mg/dL — ABNORMAL HIGH (ref 70–99)
Potassium: 4 mmol/L (ref 3.5–5.1)
Sodium: 137 mmol/L (ref 135–145)

## 2021-11-14 LAB — CBC
HCT: 34.1 % — ABNORMAL LOW (ref 36.0–46.0)
Hemoglobin: 11 g/dL — ABNORMAL LOW (ref 12.0–15.0)
MCH: 27.5 pg (ref 26.0–34.0)
MCHC: 32.3 g/dL (ref 30.0–36.0)
MCV: 85.3 fL (ref 80.0–100.0)
Platelets: 228 10*3/uL (ref 150–400)
RBC: 4 MIL/uL (ref 3.87–5.11)
RDW: 12.6 % (ref 11.5–15.5)
WBC: 6 10*3/uL (ref 4.0–10.5)
nRBC: 0 % (ref 0.0–0.2)

## 2021-11-14 LAB — TROPONIN I (HIGH SENSITIVITY): Troponin I (High Sensitivity): 5 ng/L (ref <18)

## 2021-11-14 LAB — RESP PANEL BY RT-PCR (FLU A&B, COVID) ARPGX2
Influenza A by PCR: NEGATIVE
Influenza B by PCR: NEGATIVE
SARS Coronavirus 2 by RT PCR: NEGATIVE

## 2021-11-14 LAB — I-STAT BETA HCG BLOOD, ED (MC, WL, AP ONLY): I-stat hCG, quantitative: <5 m[IU]/mL (ref <5)

## 2021-11-14 NOTE — ED Provider Notes (Addendum)
[place] DEPT Provider Note  CSN: [id number] Arrival date & time: 11/14/21 1004  Chief Complaint(s) Headache and Chest Pain  HPI Wanda Weaver is a 61 y.o. female history of hypertension, diabetes presenting to the emergency department with chest pain.  Patient reports that she has had chest pain intermittently for 2 days, worse with rolling over on her left side.  She also reports associated runny nose, fevers, cough for 2 days.  She reports everyone at work is sick with similar symptoms.  She denies sore throat, shortness of breath, productive cough, nausea, vomiting, abdominal pain, diarrhea, dysuria, flank pain, diaphoresis, syncope.  She denies exertional or pleuritic nature to her chest pain.  Symptoms are mild.  She reports that currently, she has no chest pain.   Past Medical History Past Medical History:  Diagnosis Date   Bilateral lower extremity edema 07/2019   Bilateral lower extremity pain 07/2019   Diabetes mellitus without complication (HCC)    Hemoglobin A1C greater than 9%, indicating poor diabetic control    Hyperlipidemia    Hypertension    Vitamin D deficiency    Patient Active Problem List   Diagnosis Date Noted   Bilateral lower extremity pain 08/20/2019   Bilateral lower extremity edema 08/20/2019   Hemoglobin A1C greater than 9%, indicating poor diabetic control 02/21/2019   Weight loss 02/21/2019   Muscle strain of wrist, left, initial encounter 09/22/2018   Hyperglycemia 08/15/2018   Essential hypertension 04/25/2018   Class 1 obesity due to excess calories with serious comorbidity in adult 04/25/2018   Right foot pain 04/25/2018   Anxiety 04/25/2018   Type 2 diabetes mellitus with complication, with long-term current use of insulin ([place]) 08/06/2017   Home Medication(s) Prior to Admission medications   Medication Sig Start Date End Date Taking? Authorizing Provider  albuterol (VENTOLIN HFA) 108 (90 Base) MCG/ACT inhaler  Inhale 2 puffs into the lungs every 6 (six) hours as needed for wheezing or shortness of breath. 05/09/21   [name], NP  Blood Glucose Monitoring Suppl (CAREONE BLOOD GLUCOSE SYSTEM) w/Device KIT QID prn 05/09/21   [name], NP  cyclobenzaprine (FLEXERIL) 10 MG tablet Take 1 tablet (10 mg total) by mouth 2 (two) times daily as needed for muscle spasms. 11/05/21   [name], NP  Elastic Bandages & Supports (KNEE BRACE ADJUSTABLE HINGED) MISC 1 application by Does not apply route daily. 05/09/21   [name], NP  glipiZIDE (GLUCOTROL) 5 MG tablet Take 1 tablet (5 mg total) by mouth 2 (two) times daily before a meal. 11/05/21   [name], NP  ibuprofen (ADVIL) 600 MG tablet Take 1 tablet (600 mg total) by mouth every 6 (six) hours as needed. 08/31/21   [name], PA-C  insulin glargine (LANTUS) 100 UNIT/ML injection Inject 0.6 mLs (60 Units total) into the skin at bedtime. 11/05/21 02/03/22  [name], NP  Insulin Syringe-Needle U-100 (BD INSULIN SYRINGE ULTRAFINE) 31G X 5/16" 0.5 ML MISC use to inject 50 units of insulin daily 10/08/20   [name], NP  Lancets 30G MISC use as directed 10/29/21   [name], NP  meloxicam (MOBIC) 7.5 MG tablet Take 1 tablet (7.5 mg total) by mouth daily. 11/05/21   [name], NP  metFORMIN (GLUCOPHAGE) 500 MG tablet Take 2 tablets (1,000 mg total) by mouth 2 (two) times daily with a meal. 11/05/21   [name], NP  metoprolol-hydrochlorothiazide (LOPRESSOR HCT) 50-25  MG tablet Take 1 tablet by mouth once daily. 11/05/21 12/07/21  [name], NP  Multiple Vitamin (MULTIVITAMIN) tablet Take 1 tablet by mouth daily.    [provider]  simvastatin (ZOCOR) 20 MG tablet Take 1 tablet (20 mg total) by mouth every evening. 11/05/21 11/05/22  [name], NP                                                                                                                                    Past Surgical  History Past Surgical History:  Procedure Laterality Date   APPENDECTOMY     CARPAL TUNNEL RELEASE Bilateral    CHOLECYSTECTOMY     WRIST SURGERY Bilateral    Family History Family History  Problem Relation Age of Onset   Diabetes Mother    Dementia Mother    Diabetes Father    Hypertension Father     Social History Social History   Tobacco Use   Smoking status: Never   Smokeless tobacco: Never  Vaping Use   Vaping Use: Never used  Substance Use Topics   Alcohol use: No   Drug use: No   Allergies Patient has no known allergies.  Review of Systems Review of Systems  All other systems reviewed and are negative.   Physical Exam Vital Signs  I have reviewed the triage vital signs BP (!) 160/78 (BP Location: Left Arm)   Pulse 83   Temp 98.6 F (37 C) (Oral)   Resp 20   Ht $R'5\' 2"'Jh$  (1.575 m)   Wt 90.7 kg   SpO2 100%   BMI 36.58 kg/m   Physical Exam Vitals and nursing note reviewed.  Constitutional:      General: She is not in acute distress.    Appearance: She is well-developed.  HENT:     Head: Normocephalic and atraumatic.     Mouth/Throat:     Mouth: Mucous membranes are moist.  Eyes:     Pupils: Pupils are equal, round, and reactive to light.  Cardiovascular:     Rate and Rhythm: Normal rate and regular rhythm.     Heart sounds: No murmur heard. Pulmonary:     Effort: Pulmonary effort is normal. No respiratory distress.     Breath sounds: Normal breath sounds.  Abdominal:     General: Abdomen is flat.     Palpations: Abdomen is soft.     Tenderness: There is no abdominal tenderness.  Musculoskeletal:        General: No tenderness.     Right lower leg: No edema.     Left lower leg: No edema.     Comments: + reproducible chest pain on palpation  Skin:    General: Skin is warm and dry.  Neurological:     General: No focal deficit present.     Mental Status: She is alert. Mental status is at baseline.  Psychiatric:  Mood and Affect: Mood  normal.        Behavior: Behavior normal.     ED Results and Treatments Labs (all labs ordered are listed, but only abnormal results are displayed) Labs Reviewed  BASIC METABOLIC PANEL - Abnormal; Notable for the following components:      Result Value   Glucose, Bld 112 (*)    BUN 23 (*)    Creatinine, Ser 1.01 (*)    All other components within normal limits  CBC - Abnormal; Notable for the following components:   Hemoglobin 11.0 (*)    HCT 34.1 (*)    All other components within normal limits  RESP PANEL BY RT-PCR (FLU A&B, COVID) ARPGX2  I-STAT BETA HCG BLOOD, ED ([facility], [facility], [facility] ONLY)  TROPONIN I (HIGH SENSITIVITY)                                                                                                                          Radiology DG Chest 2 View  Result Date: 11/14/2021 CLINICAL DATA:  Mid chest pain with headaches and runny nose. History of bronchitis. EXAM: CHEST - 2 VIEW COMPARISON:  Radiographs 08/12/2020 and 02/06/2018. FINDINGS: Slightly lower lung volumes. The heart size and mediastinal contours are stable. There is possible mild central airway thickening, but no evidence of edema, airspace disease, pleural effusion or pneumothorax. Mild degenerative changes are present in the spine. Telemetry leads overlie the chest. IMPRESSION: Possible mild central airway thickening as can be seen with bronchitis. No evidence of pneumonia. Electronically Signed   By: Richardean Sale M.D.   On: 11/14/2021 10:27    Pertinent labs & imaging results that were available during my care of the patient were reviewed by me and considered in my medical decision making (see MDM for details).  Medications Ordered in ED Medications - No data to display                                                                                                                                   Procedures Procedures  (including critical care time)  Medical Decision Making / ED Course  61 year old  female presenting to the emergency department chest pain.  Patient well-appearing, physical exam unremarkable.  Suspect most likely cause is musculoskeletal chest wall pain due to cough.  Doubt PE without risk factors such as recent travel, surgeries, history of blood clots, hormone use.  Doubt ACS with reassuring EKG, negative troponin.  Chest x-ray without findings concerning for pneumonia, pneumothorax.  Doubt dissection given reassuring x-ray, normal peripheral pulses, lack of risk factors.  Doubt esophageal pathology without nausea, vomiting.  Given consolation and remainder of symptoms, suspect most likely cause is related to viral URI and coughing.   Clinical Course as of 11/14/21 1258  Fri Nov 14, 2021  1144 Labs reassuring, including negative troponin.  [WS]  1145 CXR negative.  [WS]  5956 Given reassuring workup, chest pain >  3 hrs ago, no active pain, will defer repeat troponin. Will discharge patient to home. All questions answered. Patient comfortable with plan of discharge. Return precautions discussed with patient and specified on the after visit summary.  [WS]    Clinical Course User Index [WS] Cristie Hem, MD      Lab Tests: -I ordered, reviewed, and interpreted labs.   The pertinent results include:   Labs Reviewed  BASIC METABOLIC PANEL - Abnormal; Notable for the following components:      Result Value   Glucose, Bld 112 (*)    BUN 23 (*)    Creatinine, Ser 1.01 (*)    All other components within normal limits  CBC - Abnormal; Notable for the following components:   Hemoglobin 11.0 (*)    HCT 34.1 (*)    All other components within normal limits  RESP PANEL BY RT-PCR (FLU A&B, COVID) ARPGX2  I-STAT BETA HCG BLOOD, ED ([facility], [facility], [facility] ONLY)  TROPONIN I (HIGH SENSITIVITY)      EKG   EKG Interpretation  Date/Time:  Friday November 14 2021 10:11:01 EDT Ventricular Rate:  81 PR Interval:  147 QRS Duration: 90 QT Interval:  371 QTC Calculation: 431 R  Axis:   141 Text Interpretation: Sinus or ectopic atrial rhythm Probable lateral infarct, age indeterminate Confirmed by Garnette Gunner [phone]) on 11/14/2021 10:19:31 AM         Imaging Studies ordered: I ordered imaging studies including chest x-ray I independently visualized and interpreted imaging. I agree with the radiologist interpretation   Medicines ordered and prescription drug management: No orders of the defined types were placed in this encounter.   -I have reviewed the patients home medicines and have made adjustments as needed      Cardiac Monitoring: The patient was maintained on a cardiac monitor.  I personally viewed and interpreted the cardiac monitored which showed an underlying rhythm of: normal sinus rhythm   Reevaluation: After the interventions noted above, I reevaluated the patient and found that they have :improved  Co morbidities that complicate the patient evaluation  Past Medical History:  Diagnosis Date   Bilateral lower extremity edema 07/2019   Bilateral lower extremity pain 07/2019   Diabetes mellitus without complication (HCC)    Hemoglobin A1C greater than 9%, indicating poor diabetic control    Hyperlipidemia    Hypertension    Vitamin D deficiency          Final Clinical Impression(s) / ED Diagnoses Final diagnoses:  Atypical chest pain  Viral bronchitis      Cristie Hem, MD 11/14/21 1147    Cristie Hem, MD 11/14/21 1258

## 2021-11-14 NOTE — Discharge Instructions (Signed)
We did not find a dangerous cause of your chest pain today.  Your symptoms are most consistent with a viral infection.  Please take Tylenol and Motrin as needed for fever, you can take 650 mg of Tylenol every 6 hours, and 600 mg of Motrin every 6 hours.  Please return to the emergency department should you develop any additional symptoms such as difficulty breathing, fainting, vomiting, severe lightheadedness, passing out, or any other concerning symptoms

## 2021-11-14 NOTE — ED Triage Notes (Addendum)
Pt reports intermittent headache that began yesterday and left sided chest pain that began today. Pt reports have some URI symptoms of the past 3 days. Pt reports everyone at her work is sick.

## 2021-11-27 ENCOUNTER — Other Ambulatory Visit: Payer: Self-pay

## 2021-12-05 ENCOUNTER — Other Ambulatory Visit: Payer: Self-pay | Admitting: Nurse Practitioner

## 2021-12-05 DIAGNOSIS — I10 Essential (primary) hypertension: Secondary | ICD-10-CM

## 2021-12-23 ENCOUNTER — Other Ambulatory Visit: Payer: Self-pay

## 2021-12-23 ENCOUNTER — Other Ambulatory Visit: Payer: Self-pay | Admitting: Nurse Practitioner

## 2021-12-23 DIAGNOSIS — I10 Essential (primary) hypertension: Secondary | ICD-10-CM

## 2021-12-24 ENCOUNTER — Other Ambulatory Visit: Payer: Self-pay

## 2021-12-24 MED ORDER — INSULIN SYRINGE-NEEDLE U-100 30G X 5/16" 1 ML MISC
0.5000 [IU] | Freq: Every day | 11 refills | Status: DC
Start: 2021-12-24 — End: 2022-03-25
  Filled 2021-12-24: qty 100, 100d supply, fill #0
  Filled 2022-03-23: qty 100, 30d supply, fill #1
  Filled 2022-03-24: qty 100, 90d supply, fill #1

## 2021-12-25 ENCOUNTER — Other Ambulatory Visit: Payer: Self-pay

## 2022-01-05 ENCOUNTER — Other Ambulatory Visit: Payer: Self-pay

## 2022-01-05 ENCOUNTER — Other Ambulatory Visit: Payer: Self-pay | Admitting: Nurse Practitioner

## 2022-01-05 DIAGNOSIS — I10 Essential (primary) hypertension: Secondary | ICD-10-CM

## 2022-01-05 MED ORDER — METOPROLOL-HYDROCHLOROTHIAZIDE 50-25 MG PO TABS
1.0000 | ORAL_TABLET | Freq: Every day | ORAL | 0 refills | Status: DC
  Filled 2022-01-05: qty 30, 30d supply, fill #0

## 2022-01-05 NOTE — Telephone Encounter (Signed)
BP med refill 

## 2022-01-06 ENCOUNTER — Other Ambulatory Visit: Payer: Self-pay

## 2022-01-16 ENCOUNTER — Other Ambulatory Visit: Payer: Self-pay

## 2022-01-16 ENCOUNTER — Telehealth: Payer: Self-pay | Admitting: Nurse Practitioner

## 2022-01-16 MED ORDER — ALBUTEROL SULFATE HFA 108 (90 BASE) MCG/ACT IN AERS
2.0000 | INHALATION_SPRAY | Freq: Four times a day (QID) | RESPIRATORY_TRACT | 2 refills | Status: DC | PRN
Start: 2021-05-09 — End: 2022-07-23
  Filled 2022-01-16: qty 18, 25d supply, fill #0
  Filled 2022-02-27: qty 18, 53d supply, fill #0
  Filled 2022-02-27 – 2022-03-23 (×4): qty 18, 25d supply, fill #0
  Filled 2022-05-03: qty 18, 25d supply, fill #1

## 2022-01-16 NOTE — Telephone Encounter (Signed)
Inhaler refill request

## 2022-01-23 ENCOUNTER — Other Ambulatory Visit: Payer: Self-pay

## 2022-01-27 ENCOUNTER — Other Ambulatory Visit: Payer: Self-pay

## 2022-01-28 ENCOUNTER — Other Ambulatory Visit: Payer: Self-pay

## 2022-02-05 ENCOUNTER — Telehealth: Payer: Self-pay

## 2022-02-05 ENCOUNTER — Ambulatory Visit (INDEPENDENT_AMBULATORY_CARE_PROVIDER_SITE_OTHER): Payer: Self-pay | Admitting: Nurse Practitioner

## 2022-02-05 ENCOUNTER — Other Ambulatory Visit: Payer: Self-pay

## 2022-02-05 ENCOUNTER — Other Ambulatory Visit (HOSPITAL_COMMUNITY): Payer: Self-pay

## 2022-02-05 ENCOUNTER — Encounter: Payer: Self-pay | Admitting: Nurse Practitioner

## 2022-02-05 VITALS — BP 146/80 | HR 66 | Temp 97.3°F | Ht 62.0 in | Wt 203.4 lb

## 2022-02-05 DIAGNOSIS — E118 Type 2 diabetes mellitus with unspecified complications: Secondary | ICD-10-CM

## 2022-02-05 DIAGNOSIS — Z794 Long term (current) use of insulin: Secondary | ICD-10-CM

## 2022-02-05 DIAGNOSIS — M25561 Pain in right knee: Secondary | ICD-10-CM

## 2022-02-05 DIAGNOSIS — K219 Gastro-esophageal reflux disease without esophagitis: Secondary | ICD-10-CM

## 2022-02-05 LAB — POCT GLYCOSYLATED HEMOGLOBIN (HGB A1C): Hemoglobin A1C: 7.7 % — AB (ref 4.0–5.6)

## 2022-02-05 MED ORDER — OMEPRAZOLE 20 MG PO CPDR
20.0000 mg | DELAYED_RELEASE_CAPSULE | Freq: Every day | ORAL | 3 refills | Status: DC
  Filled 2022-02-05 – 2022-05-03 (×2): qty 30, 30d supply, fill #0

## 2022-02-05 NOTE — Patient Instructions (Signed)
1. Gastroesophageal reflux disease without esophagitis  - omeprazole (PRILOSEC) 20 MG capsule; Take 1 capsule (20 mg total) by mouth daily.  Dispense: 30 capsule; Refill: 3  2. Acute pain of right knee  - DG Knee Complete 4 Views Right  3. Type 2 diabetes mellitus with complication, with long-term current use of insulin (HCC)  - AMB Referral to Pharmacy Medication Management    Follow up:  Follow up in 3 months

## 2022-02-05 NOTE — Chronic Care Management (AMB) (Signed)
   Care Guide Note  02/05/2022 Name: Wanda Weaver MRN: [id number] DOB: 04/28/1960  Referred by: [name], NP Reason for referral : Care Coordination (Outreach to schedule with pharm d )   Wanda Weaver is a 61 y.o. year old female who is a primary care patient of [name], NP. Wanda Weaver was referred to the pharmacist for assistance related to DM.    Successful contact was made with the patient to discuss pharmacy services including being ready for the pharmacist to call at least 5 minutes before the scheduled appointment time, to have medication bottles and any blood sugar or blood pressure readings ready for review. The patient agreed to meet with the pharmacist via with the pharmacist via telephone visit on 02/23/2022.    Wanda Weaver, [place],  [address] Direct Dial: [phone] [email or web address]

## 2022-02-05 NOTE — Progress Notes (Deleted)
$'@Patient'H$  ID: Wanda Weaver, female    DOB: [birth year], 61 y.o.   MRN: [id number]  Chief Complaint  Patient presents with   Diabetes    Follow up    Referring provider: [name], NP   HPI  Wanda Weaver 61 y.o. female  has a past medical history of Bilateral lower extremity edema (07/2019), Bilateral lower extremity pain (07/2019), Diabetes mellitus without complication ([place]), Hemoglobin A1C greater than 9%, indicating poor diabetic control, Hyperlipidemia, Hypertension, and Vitamin D deficiency.  To the [facility] for right knee pain.   Patient presents today for follow-up visit for diabetes.  She does need medications refilled today. Denies f/c/s, n/v/d, hemoptysis, PND, leg swelling Denies chest pain or edema    No Known Allergies  Immunization History  Administered Date(s) Administered   Pneumococcal Polysaccharide-23 08/06/2017    Past Medical History:  Diagnosis Date   Bilateral lower extremity edema 07/2019   Bilateral lower extremity pain 07/2019   Diabetes mellitus without complication (HCC)    Hemoglobin A1C greater than 9%, indicating poor diabetic control    Hyperlipidemia    Hypertension    Vitamin D deficiency     Tobacco History: Social History   Tobacco Use  Smoking Status Never  Smokeless Tobacco Never   Counseling given: Not Answered   Outpatient Encounter Medications as of 02/05/2022  Medication Sig   acetaminophen (TYLENOL) 500 MG tablet Take 500 mg by mouth every 6 (six) hours as needed.   albuterol (VENTOLIN HFA) 108 (90 Base) MCG/ACT inhaler Inhale 2 puffs into the lungs every 6 (six) hours as needed for wheezing or shortness of breath.   Blood Glucose Monitoring Suppl (CAREONE BLOOD GLUCOSE SYSTEM) w/Device KIT QID prn   cyclobenzaprine (FLEXERIL) 10 MG tablet Take 1 tablet (10 mg total) by mouth 2 (two) times daily as needed for muscle spasms.   Elastic Bandages & Supports (KNEE BRACE ADJUSTABLE HINGED) MISC 1 application by Does not apply  route daily.   glipiZIDE (GLUCOTROL) 5 MG tablet Take 1 tablet (5 mg total) by mouth 2 (two) times daily before a meal.   insulin glargine (LANTUS) 100 UNIT/ML injection Inject 0.6 mLs (60 Units total) into the skin at bedtime.   Insulin Syringe-Needle U-100 (TRUE COMFORT PRO INSULIN SYR) 30G X 5/16" 1 ML MISC Use with Lantus daily.   Lancets 30G MISC use as directed   meloxicam (MOBIC) 7.5 MG tablet Take 1 tablet (7.5 mg total) by mouth daily.   metFORMIN (GLUCOPHAGE) 500 MG tablet Take 2 tablets (1,000 mg total) by mouth 2 (two) times daily with a meal.   metoprolol-hydrochlorothiazide (LOPRESSOR HCT) 50-25 MG tablet Take 1 tablet by mouth once daily.   Multiple Vitamin (MULTIVITAMIN) tablet Take 1 tablet by mouth daily.   simvastatin (ZOCOR) 20 MG tablet Take 1 tablet (20 mg total) by mouth every evening.   albuterol (VENTOLIN HFA) 108 (90 Base) MCG/ACT inhaler Inhale 2 puffs into the lungs every 6 (six) hours as needed.   ibuprofen (ADVIL) 600 MG tablet Take 1 tablet (600 mg total) by mouth every 6 (six) hours as needed.   No facility-administered encounter medications on file as of 02/05/2022.     Review of Systems  Review of Systems  Constitutional: Negative.   HENT: Negative.    Cardiovascular: Negative.   Gastrointestinal: Negative.   Allergic/Immunologic: Negative.   Neurological: Negative.   Psychiatric/Behavioral: Negative.         Physical Exam  There were no vitals taken for  this visit.  Wt Readings from Last 5 Encounters:  11/14/21 200 lb (90.7 kg)  11/05/21 206 lb (93.4 kg)  08/31/21 190 lb (86.2 kg)  05/19/21 203 lb 8 oz (92.3 kg)  05/09/21 209 lb 3.2 oz (94.9 kg)     Physical Exam Vitals and nursing note reviewed.  Constitutional:      General: She is not in acute distress.    Appearance: She is well-developed.  Cardiovascular:     Rate and Rhythm: Normal rate and regular rhythm.  Pulmonary:     Effort: Pulmonary effort is normal.     Breath  sounds: Normal breath sounds.  Neurological:     Mental Status: She is alert and oriented to person, place, and time.       Assessment & Plan:   No problem-specific Assessment & Plan notes found for this encounter.     [name], NP 02/05/2022

## 2022-02-05 NOTE — Progress Notes (Signed)
Subjective:    Patient ID: Wanda Weaver, female    DOB: 11-Aug-1960, 61 y.o.   MRN: [id number]  Wanda Weaver is a 61 y.o. female who presents for follow-up of Type 2 diabetes mellitus. Has a past medical history of Bilateral lower extremity edema (07/2019), Bilateral lower extremity pain (07/2019), Diabetes mellitus without complication (HCC), Hemoglobin A1C greater than 9%, indicating poor diabetic control, Hyperlipidemia, Hypertension, and Vitamin D deficiency.  To the [facility] for right knee pain.    Patient is checking home blood sugars.   Home blood sugar records: BGs have been labile ranging between 87 and 425 How often is blood sugars being checked: three time a day Current symptoms/problems include nausea and vomitting and have been unchanged. Daily foot checks: yes   Any foot concerns: none Last eye exam: 08/19/2017 Dr. Elmer Picker  Exercise: The patient has a physically strenuous job, but has no regular exercise apart from work.    Complains of right knee pain. Has been referred to ortho - but could not afford visit. Did not fill out financial assistance.    The following portions of the patient's history were reviewed and updated as appropriate: allergies, current medications, past medical history, past social history and problem list.  Review of Systems  Constitutional: Negative.   HENT: Negative.    Eyes: Negative.   Respiratory: Negative.    Cardiovascular: Negative.   Gastrointestinal: Negative.   Genitourinary: Negative.   Musculoskeletal:        Right knee pain  Skin: Negative.   Neurological: Negative.   Psychiatric/Behavioral: Negative.          Objective:    Physical Exam Constitutional:      General: She is not in acute distress. Cardiovascular:     Rate and Rhythm: Normal rate and regular rhythm.  Pulmonary:     Effort: Pulmonary effort is normal.     Breath sounds: Normal breath sounds.  Skin:    General: Skin is warm and dry.  Neurological:     Mental Status:  She is alert and oriented to person, place, and time.  Psychiatric:        Mood and Affect: Affect normal.      Blood pressure (!) 146/80, pulse 66, temperature (!) 97.3 F (36.3 C), height 5\' 2"  (1.575 m), weight 203 lb 6.4 oz (92.3 kg), SpO2 99 %.  Lab Review    Latest Ref Rng & Units 11/14/2021   10:41 AM 11/05/2021   10:34 AM 11/05/2021   10:33 AM 05/19/2021   10:06 AM 05/09/2021    9:21 AM  Diabetic Labs  HbA1c 5.7 - 6.4 % 0.0 - 7.0 % 4.0 - 5.6 % 4.0 - 5.6 %   8.9    8.9    8.9    8.9  7.7    7.7    7.7    7.7    Chol 100 - 199 mg/dL     05/11/2021   HDL 010 mg/dL     53   Calc LDL 0 - 99 mg/dL     >93   Triglycerides 0 - 149 mg/dL     235   Creatinine 573 - 1.00 mg/dL 2.20  2.54    2.70       02/05/2022    8:35 AM 11/14/2021   10:19 AM 11/14/2021   10:12 AM 11/05/2021   10:05 AM 11/05/2021    9:52 AM  BP/Weight  Systolic BP 146  11/07/2021 141 156  Diastolic  BP 80  78 70 69  Wt. (Lbs) 203.4 200   206  BMI 37.2 kg/m2 36.58 kg/m2   37.68 kg/m2       No data to display          Wanda Weaver  reports that she has never smoked. She has never used smokeless tobacco. She reports that she does not drink alcohol and does not use drugs.       Assessment & Plan:    Gastroesophageal reflux disease without esophagitis - Plan: omeprazole (PRILOSEC) 20 MG capsule  Acute pain of right knee - Plan: DG Knee Complete 4 Views Right  Type 2 diabetes mellitus with complication, with long-term current use of insulin ([place]) - Plan: AMB Referral to Pharmacy Medication Management  Rx changes: none Education: Reviewed 'ABCs' of diabetes management (respective goals in parentheses):  A1C (<7), blood pressure (<130/80), and cholesterol (LDL <100). Compliance at present is estimated to be poor. Efforts to improve compliance (if necessary) will be directed at  pharmacy consult . Follow up: 3 months  1. Gastroesophageal reflux disease without esophagitis  - omeprazole (PRILOSEC) 20 MG capsule; Take 1  capsule (20 mg total) by mouth daily.  Dispense: 30 capsule; Refill: 3  2. Acute pain of right knee  - DG Knee Complete 4 Views Right  3. Type 2 diabetes mellitus with complication, with long-term current use of insulin (HCC)  - AMB Referral to Pharmacy Medication Management    Follow up:  Follow up in 3 months

## 2022-02-06 NOTE — Progress Notes (Signed)
Pt needed a return to work note . This was done and printed for provider to sign.  [name]

## 2022-02-07 ENCOUNTER — Other Ambulatory Visit: Payer: Self-pay | Admitting: Nurse Practitioner

## 2022-02-07 DIAGNOSIS — I10 Essential (primary) hypertension: Secondary | ICD-10-CM

## 2022-02-09 ENCOUNTER — Other Ambulatory Visit: Payer: Self-pay

## 2022-02-11 ENCOUNTER — Other Ambulatory Visit: Payer: Self-pay

## 2022-02-11 ENCOUNTER — Other Ambulatory Visit: Payer: Self-pay | Admitting: Nurse Practitioner

## 2022-02-11 DIAGNOSIS — Z794 Long term (current) use of insulin: Secondary | ICD-10-CM

## 2022-02-12 ENCOUNTER — Other Ambulatory Visit: Payer: Self-pay

## 2022-02-12 NOTE — Telephone Encounter (Signed)
Insulin BP med's

## 2022-02-13 ENCOUNTER — Other Ambulatory Visit: Payer: Self-pay

## 2022-02-13 MED ORDER — INSULIN GLARGINE 100 UNIT/ML ~~LOC~~ SOLN
60.0000 [IU] | Freq: Every day | SUBCUTANEOUS | 2 refills | Status: DC
  Filled 2022-02-13: qty 40, 66d supply, fill #0

## 2022-02-16 ENCOUNTER — Other Ambulatory Visit: Payer: Self-pay

## 2022-02-17 ENCOUNTER — Telehealth: Payer: Self-pay | Admitting: Clinical

## 2022-02-17 NOTE — Telephone Encounter (Signed)
Patient requesting refill of Lopressor

## 2022-02-17 NOTE — Telephone Encounter (Signed)
[facility] General Follow Up Note  02/17/2022 Name: Wanda Weaver MRN: [id number] DOB: 12-26-1960 Wanda Weaver is a 60 y.o. year old female who sees [name], NP for primary care. LCSW was consulted to assess patient's needs and assist the patient with Financial Difficulties related to inadequate health insurance and low income.  Interpreter: No.   Interpreter Name & Language: none  Assessment: Patient experiencing financial difficulties related to inadequate health insurance and low income.  Ongoing Intervention: Patient called and reported a problem having her lopressor refilled. She reports having called our office and the pharmacy several times but has been unable to get the refill. She was recently in the office to see PCP but reported her visit was unexpectedly cut short. She did not get to discuss all issues with her provider. CSW sent refill request to provider.  Review of patient status, including review of consultants reports, relevant laboratory and other test results, and collaboration with appropriate care team members and the patient's provider was performed as part of comprehensive patient evaluation and provision of services.    Estanislado Emms, [facility] [phone]

## 2022-02-18 ENCOUNTER — Other Ambulatory Visit: Payer: Self-pay | Admitting: Nurse Practitioner

## 2022-02-18 ENCOUNTER — Other Ambulatory Visit: Payer: Self-pay

## 2022-02-18 DIAGNOSIS — I10 Essential (primary) hypertension: Secondary | ICD-10-CM

## 2022-02-18 MED ORDER — METOPROLOL-HYDROCHLOROTHIAZIDE 50-25 MG PO TABS
1.0000 | ORAL_TABLET | Freq: Every day | ORAL | 2 refills | Status: DC
  Filled 2022-02-18: qty 30, 30d supply, fill #0
  Filled 2022-03-25: qty 30, 30d supply, fill #1
  Filled 2022-05-03: qty 30, 30d supply, fill #2

## 2022-02-20 ENCOUNTER — Other Ambulatory Visit: Payer: Self-pay

## 2022-02-23 ENCOUNTER — Other Ambulatory Visit: Payer: Medicaid Other | Admitting: Pharmacist

## 2022-02-23 ENCOUNTER — Telehealth: Payer: Self-pay | Admitting: Pharmacist

## 2022-02-23 NOTE — Progress Notes (Signed)
Attempted to contact patient for scheduled appointment for medication management. Left HIPAA compliant message for patient to return my call at their convenience.   Catie T. Lorianne Malbrough, PharmD, BCACP [facility] [phone]  

## 2022-02-25 ENCOUNTER — Other Ambulatory Visit: Payer: Medicaid Other | Admitting: Pharmacist

## 2022-02-25 NOTE — Progress Notes (Signed)
02/25/2022 Name: Andilyn Bettcher MRN: [id number] DOB: 1960/08/06  Chief Complaint  Patient presents with   Medication Management   Diabetes   Hypertension    Zenaya Ulatowski is a 61 y.o. year old female who presented for a telephone visit.   They were referred to the pharmacist by their PCP for assistance in managing diabetes, hypertension, and hyperlipidemia.   Subjective:  Care Team: Primary Care Provider: [name], NP ; Next Scheduled Visit: 05/08/21  Medication Access/Adherence  Current Pharmacy:  [facility] [address] [place] [address] Phone: [phone] Fax: [phone]   Patient reports affordability concerns with their medications: Yes  Patient reports access/transportation concerns to their pharmacy: No  Patient reports adherence concerns with their medications:  Yes    Reports she will pick up Medicaid in March 13, 2022.    Diabetes:  Current medications: metformin 1000 mg twice daily - reports significant diarrhea, stomach upset, Lantus vial 60 units daily (around 8-9 pm)  Current glucose readings: fasting: 285; this morning was 88; reports a few weeks ago she passed out at work due to hypoglycemia. Works at [employer] and cannot always stop work when she feels poorly.   Reports she prefers insulin vials/syringes to pens because the pens are too big for her hand.   Patient reports hypoglycemic s/sx including dizziness, shakiness, sweating.    Current meal patterns: reports she has a hard time affording healthy foods. Recently signed up for food stamps  Current medication access support: currently cannot afford her medications  Hypertension:  Current medications: metoprolol/HCTZ 50/25 mg daily  Patient does not have a validated, automated, upper arm home BP cuff. Will pursue once Medicaid coverage starts.   Hyperlipidemia/ASCVD Risk Reduction  Current lipid lowering  medications: prescribed simvastatin, but does not have.    Health Maintenance  Health Maintenance Due  Topic Date Due   COLONOSCOPY (Pts 45-17yr Insurance coverage will need to be confirmed)  Never done   MAMMOGRAM  Never done   Zoster Vaccines- Shingrix (1 of 2) Never done   Diabetic kidney evaluation - Urine ACR  08/07/2018   PAP SMEAR-Modifier  09/17/2020   FOOT EXAM  10/16/2021   OPHTHALMOLOGY EXAM  10/16/2021     Objective: Lab Results  Component Value Date   HGBA1C 7.7 (A) 02/05/2022    Lab Results  Component Value Date   CREATININE 1.01 (H) 11/14/2021   BUN 23 (H) 11/14/2021   NA 137 11/14/2021   K 4.0 11/14/2021   CL 105 11/14/2021   CO2 26 11/14/2021    Lab Results  Component Value Date   CHOL 259 (H) 05/09/2021   HDL 53 05/09/2021   LDLCALC 178 (H) 05/09/2021   TRIG 155 (H) 05/09/2021   CHOLHDL 4.9 (H) 05/09/2021    Medications Reviewed Today     Reviewed by HOsker Mason RPH-CPP (Pharmacist) on 02/25/22 at 1[place] City[place] Status: <None>   Medication Order Taking? Sig Documenting Provider Last Dose Status Informant  acetaminophen (TYLENOL) 500 MG tablet [id number] Take 500 mg by mouth every 6 (six) hours as needed. [provider]  Active            Med Note ([facility]  Thu Feb 05, 2022  8:27 AM) Prn last dose yesterday  albuterol (VENTOLIN HFA) 108 (90 Base) MCG/ACT inhaler [id number] Inhale 2 puffs into the lungs every 6 (six) hours as needed for wheezing  or shortness of breath. [name], NP  Active            Med Note [name], Mikel Hardgrove T   Wed Feb 25, 2022 10:36 AM)    albuterol (VENTOLIN HFA) 108 (90 Base) MCG/ACT inhaler [id number] No Inhale 2 puffs into the lungs every 6 (six) hours as needed.  Patient not taking: Reported on 02/25/2022   [name], NP Not Taking Active   Blood Glucose Monitoring Suppl ([place]) w/Device KIT [id number] No QID prn  Patient not taking: Reported on 02/25/2022    [name], NP Not Taking Active   cyclobenzaprine (FLEXERIL) 10 MG tablet [id number] No Take 1 tablet (10 mg total) by mouth 2 (two) times daily as needed for muscle spasms.  Patient not taking: Reported on 02/25/2022   [name], NP Not Taking Active            Med Note Elyse Jarvis   Thu Feb 05, 2022  8:24 AM) Prn last dose yesterday  Elastic Bandages & Supports ([facility]) [place] [id number] No 1 application by Does not apply route daily.  Patient not taking: Reported on 02/25/2022   [name], NP Not Taking Active   ibuprofen (ADVIL) 600 MG tablet [id number] No Take 1 tablet (600 mg total) by mouth every 6 (six) hours as needed.  Patient not taking: Reported on 02/25/2022   [name], PA-C Not Taking Active            Med Note Mallie Mussel, [facility]   Thu Feb 05, 2022  8:26 AM) Prn last dose two weeks ago  insulin glargine (LANTUS) 100 UNIT/ML injection [id number] Yes Inject 0.6 mLs (60 Units total) into the skin at bedtime. [name], NP Taking Active   Insulin Syringe-Needle U-100 (TRUE COMFORT PRO INSULIN SYR) 30G X 5/16" 1 ML MISC [id number] Yes Use with Lantus daily. [name], NP Taking Active   Lancets 30G MISC [id number]  use as directed [name], NP  Active   meloxicam (MOBIC) 7.5 MG tablet [id number]  Take 1 tablet (7.5 mg total) by mouth daily. [name], NP  Active   metFORMIN (GLUCOPHAGE) 500 MG tablet [id number] Yes Take 2 tablets (1,000 mg total) by mouth 2 (two) times daily with a meal. [name], NP Taking Active   metoprolol-hydrochlorothiazide (LOPRESSOR HCT) 50-25 MG tablet [id number] Yes Take 1 tablet by mouth once daily. [name], NP Taking Active   Multiple Vitamin (MULTIVITAMIN) tablet [id number]  Take 1 tablet by mouth daily. [provider]  Active   omeprazole (PRILOSEC) 20 MG capsule [id number]  Take 1 capsule (20 mg total) by mouth daily. [name], NP  Active   simvastatin  (ZOCOR) 20 MG tablet [id number] No Take 1 tablet (20 mg total) by mouth every evening.  Patient not taking: Reported on 02/25/2022   [name], NP Not Taking Active               Assessment/Plan:   Diabetes: - Currently uncontrolled - Discussed changing to metformin XR, but patient requests to hold until Medicaid takes effect next month. She will adjust metformin to take after meals to see if this improves tolerability. Recommend to reduce Lantus to 50 units daily to reduce risk of hypoglycemia. Discussed with PCP, she is in agreement.  - Recommend to continue to check glucose twice daily.  - Moving forward, will pursue GLP1.   Hypertension: -  Currently uncontrolled - Continue current regimen at this time. Will order home BP machine when coverage starts  Hyperlipidemia/ASCVD Risk Reduction: - Currently uncontrolled.  - Recommend to change to moderate to high intensity rosuvastatin when insurance takes effect. Will discuss this with next call.   Follow Up Plan: phone call in 2 weeks  Catie TJodi Mourning, PharmD, [facility] [phone]

## 2022-02-27 ENCOUNTER — Other Ambulatory Visit: Payer: Self-pay

## 2022-03-02 ENCOUNTER — Other Ambulatory Visit: Payer: Self-pay

## 2022-03-06 ENCOUNTER — Other Ambulatory Visit: Payer: Self-pay

## 2022-03-12 ENCOUNTER — Other Ambulatory Visit: Payer: Self-pay

## 2022-03-13 ENCOUNTER — Other Ambulatory Visit: Payer: Self-pay

## 2022-03-18 ENCOUNTER — Other Ambulatory Visit: Payer: Self-pay

## 2022-03-20 ENCOUNTER — Other Ambulatory Visit: Payer: Self-pay

## 2022-03-23 ENCOUNTER — Other Ambulatory Visit: Payer: Medicaid Other | Admitting: Pharmacist

## 2022-03-23 ENCOUNTER — Other Ambulatory Visit: Payer: Self-pay

## 2022-03-23 ENCOUNTER — Other Ambulatory Visit (HOSPITAL_BASED_OUTPATIENT_CLINIC_OR_DEPARTMENT_OTHER): Payer: Self-pay

## 2022-03-23 NOTE — Progress Notes (Unsigned)
03/24/2022 Name: Wanda Weaver MRN: [id number] DOB: 23-Oct-1960  Chief Complaint  Patient presents with   Medication Management   Diabetes    Wanda Weaver is a 61 y.o. year old female who presented for a telephone visit.   They were referred to the pharmacist by their PCP for assistance in managing diabetes.   Patient is participating in a Managed Medicaid Plan:  Yes  Subjective:  Care Team: Primary Care Provider: [name], NP ; Next Scheduled Visit: 05/08/21  Medication Access/Adherence  Current Pharmacy:  [facility] [address], [place] [address] Phone: [phone] Fax: [phone]   Patient reports affordability concerns with their medications: No  Patient reports access/transportation concerns to their pharmacy: No  Patient reports adherence concerns with their medications:  No    Now has Managed Medicaid insurance  Diabetes:  Current medications: metformin 1000 mg twice daily; Lantus 60 units daily - did not reduce dose as instructed  Does report some loose stools with metformin.   Current glucose readings: fastings 200-250s; though occasionally having hypoglycemic episodes during the day.   Asks about CGM.   Current meal patterns: reports she recently purchased wheat bread, [place] breast;  - Breakfast: sometimes skips, sometimes banana - Lunch: sometimes skips, depends on what she has at work  Hypertension:  Current medications: metoprolol/HCTZ 50/25 mg daily  Hyperlipidemia/ASCVD Risk Reduction  Current lipid lowering medications: simvastatin 20 mg daily   Health Maintenance  Health Maintenance Due  Topic Date Due   DTaP/Tdap/Td (1 - Tdap) Never done   COLONOSCOPY (Pts 45-70yr Insurance coverage will need to be confirmed)  Never done   MAMMOGRAM  Never done   Zoster Vaccines- Shingrix (1 of 2) Never done   Diabetic kidney evaluation - Urine ACR  08/07/2018   PAP  SMEAR-Modifier  09/17/2020   FOOT EXAM  10/16/2021   OPHTHALMOLOGY EXAM  10/16/2021     Objective: Lab Results  Component Value Date   HGBA1C 7.7 (A) 02/05/2022    Lab Results  Component Value Date   CREATININE 1.01 (H) 11/14/2021   BUN 23 (H) 11/14/2021   NA 137 11/14/2021   K 4.0 11/14/2021   CL 105 11/14/2021   CO2 26 11/14/2021    Lab Results  Component Value Date   CHOL 259 (H) 05/09/2021   HDL 53 05/09/2021   LDLCALC 178 (H) 05/09/2021   TRIG 155 (H) 05/09/2021   CHOLHDL 4.9 (H) 05/09/2021    Medications Reviewed Today     Reviewed by HOsker Mason RPH-CPP (Pharmacist) on 03/23/22 at 1439  Med List Status: <None>   Medication Order Taking? Sig Documenting Provider Last Dose Status Informant  acetaminophen (TYLENOL) 500 MG tablet [id number] Take 500 mg by mouth every 6 (six) hours as needed. [provider]  Active            Med Note (Cleopatra CedarDec 11, 2023  2:34 PM)    albuterol (VENTOLIN HFA) 108 ([phone]) MCG/ACT inhaler [id number] Inhale 2 puffs into the lungs every 6 (six) hours as needed for wheezing or shortness of breath. KVevelyn Francois NP  Active            Med Note ([name] Mehreen Azizi T   Wed Feb 25, 2022 10:36 AM)    albuterol (VENTOLIN HFA) 108 (90 Base) MCG/ACT inhaler [id number] Inhale 2 puffs into the lungs every 6 (six) hours  as needed. [name], NP Taking Active   Blood Glucose Monitoring Suppl [facility] BLOOD GLUCOSE SYSTEM) w/Device KIT [id number] No QID prn  Patient not taking: Reported on 02/25/2022   [name], NP Not Taking Active   cyclobenzaprine (FLEXERIL) 10 MG tablet [id number] Yes Take 1 tablet (10 mg total) by mouth 2 (two) times daily as needed for muscle spasms. [name], NP Taking Active            Med Note Cleopatra Cedar Mar 23, 2022  2:36 PM)    Elastic Bandages & Supports (KNEE BRACE ADJUSTABLE HINGED) [place] [id number]  1 application by Does not apply route daily.   Patient not taking: Reported on 02/25/2022   [name], NP  Active   ibuprofen (ADVIL) 600 MG tablet [id number] No Take 1 tablet (600 mg total) by mouth every 6 (six) hours as needed.  Patient not taking: Reported on 02/25/2022   [name], PA-C Not Taking Active            Med Note [name], [name] Mar 23, 2022  2:36 PM)    insulin glargine (LANTUS) 100 UNIT/ML injection [id number] Yes Inject 0.6 mLs (60 Units total) into the skin at bedtime. [name], NP Taking Active   Insulin Syringe-Needle U-100 (TRUE COMFORT PRO INSULIN SYR) 30G X 5/16" 1 ML MISC [id number] No Use with Lantus daily.  Patient not taking: Reported on 03/23/2022   [name], NP Not Taking Active   Lancets 30G MISC [id number] No use as directed  Patient not taking: Reported on 03/23/2022   [name], NP Not Taking Active   meloxicam (MOBIC) 7.5 MG tablet [id number] No Take 1 tablet (7.5 mg total) by mouth daily.  Patient not taking: Reported on 03/23/2022   [name], NP Not Taking Active   metFORMIN (GLUCOPHAGE) 500 MG tablet [id number] Yes Take 2 tablets (1,000 mg total) by mouth 2 (two) times daily with a meal. [name], NP Taking Active   metoprolol-hydrochlorothiazide (LOPRESSOR HCT) 50-25 MG tablet [id number] Yes Take 1 tablet by mouth once daily. [name], NP Taking Active   Multiple Vitamin (MULTIVITAMIN) tablet [id number] Yes Take 1 tablet by mouth daily. [provider] Taking Active   omeprazole (PRILOSEC) 20 MG capsule [id number] No Take 1 capsule (20 mg total) by mouth daily.  Patient not taking: Reported on 03/23/2022   [name], NP Not Taking Active   simvastatin (ZOCOR) 20 MG tablet [id number] No Take 1 tablet (20 mg total) by mouth every evening.  Patient not taking: Reported on 02/25/2022   [name], NP Not Taking Active               Assessment/Plan:   Diabetes: - Currently uncontrolled - Reviewed long term  cardiovascular and renal outcomes of uncontrolled blood sugar - Reviewed goal A1c, goal fasting, and goal 2 hour post prandial glucose - Reviewed dietary modifications including: focus on lean proteins, whole grains, increase in fibers;  - Recommend to start Ozempic 0.25 mg weekly for 4 weeks, then increase to 0.5 mg weekly. No history of pancreatitis, gallbladder disease, or medullary thyroid cancer. Discussed mechanism of action and side effects. If significant stomach pain, vomiting, or persistent nausea, counseled to contact primary care provider or myself. Reduce Lantus to 40 units daily. Due to loose stools, switch metformin from IR to XR formulation. Discussed with PCP, she is in  agreement. Patient verbalized understanding. Order placed for new glucometer, test strips, lancets due to new insurance coverage. Discussed that Medicaid requires 2+ doses of insulin for CGM coverage.  - Recommend to check glucose twice daily, fasting and 2 hour post prandial - PA completed for Ozempic. Approved through 03/25/2023.   Hypertension: - Currently suboptimally treated due to once daily use of metoprolol tartrate. Will discuss home BP monitoring moving forward.   Hyperlipidemia/ASCVD Risk Reduction: - Currently uncontrolled.  - Recommend to switch from simvastatin to rosuvastatin due to lower risk of side effects and ability to dose in the morning with efficacy. Discussed with PCP, she is in agreement. Patient verbalizes understanding   Follow Up Plan: phone call in 4 weeks  Catie TJodi Mourning, PharmD, [facility] [phone]

## 2022-03-24 ENCOUNTER — Other Ambulatory Visit: Payer: Self-pay

## 2022-03-25 ENCOUNTER — Other Ambulatory Visit: Payer: Self-pay

## 2022-03-25 ENCOUNTER — Other Ambulatory Visit: Payer: Self-pay | Admitting: Pharmacist

## 2022-03-25 MED ORDER — BLOOD GLUCOSE MONITOR KIT
1.0000 | PACK | Freq: Four times a day (QID) | 0 refills | Status: DC
  Filled 2022-03-25 (×2): qty 1, 30d supply, fill #0

## 2022-03-25 MED ORDER — ACCU-CHEK GUIDE VI STRP
ORAL_STRIP | 12 refills | Status: DC
  Filled 2022-03-25: qty 100, 25d supply, fill #0
  Filled 2022-05-03: qty 100, 25d supply, fill #1
  Filled 2022-06-15: qty 100, 25d supply, fill #2
  Filled 2022-07-26: qty 100, 25d supply, fill #3
  Filled 2022-09-02: qty 100, 25d supply, fill #4
  Filled 2022-10-09: qty 100, 25d supply, fill #5
  Filled 2022-11-03: qty 100, 25d supply, fill #6
  Filled 2022-12-27: qty 100, 25d supply, fill #7
  Filled 2023-01-21 – 2023-01-26 (×3): qty 100, 25d supply, fill #8

## 2022-03-25 MED ORDER — METFORMIN HCL ER 500 MG PO TB24
1000.0000 mg | ORAL_TABLET | Freq: Two times a day (BID) | ORAL | 1 refills | Status: DC
  Filled 2022-03-25: qty 120, 30d supply, fill #0
  Filled 2022-04-14 – 2022-04-17 (×4): qty 120, 30d supply, fill #1

## 2022-03-25 MED ORDER — ACCU-CHEK SOFTCLIX LANCETS MISC
12 refills | Status: DC
  Filled 2022-03-25: qty 100, 25d supply, fill #0
  Filled 2022-05-03: qty 100, 25d supply, fill #1
  Filled 2023-03-18: qty 100, 25d supply, fill #2

## 2022-03-25 MED ORDER — LANTUS SOLOSTAR 100 UNIT/ML ~~LOC~~ SOPN
40.0000 [IU] | PEN_INJECTOR | Freq: Every day | SUBCUTANEOUS | 2 refills | Status: DC
  Filled 2022-03-25: qty 30, 75d supply, fill #0

## 2022-03-25 MED ORDER — ROSUVASTATIN CALCIUM 20 MG PO TABS
20.0000 mg | ORAL_TABLET | Freq: Every day | ORAL | 3 refills | Status: DC
  Filled 2022-03-25: qty 90, 90d supply, fill #0

## 2022-03-25 MED ORDER — PEN NEEDLES 32G X 4 MM MISC
11 refills | Status: DC
  Filled 2022-03-25: qty 100, 90d supply, fill #0

## 2022-03-25 MED ORDER — OZEMPIC (0.25 OR 0.5 MG/DOSE) 2 MG/3ML ~~LOC~~ SOPN
PEN_INJECTOR | SUBCUTANEOUS | 2 refills | Status: DC
  Filled 2022-03-25: qty 3, 28d supply, fill #0
  Filled 2022-03-25: qty 3, 42d supply, fill #0
  Filled 2022-03-25: qty 3, 34d supply, fill #0
  Filled 2022-03-25: qty 3, 42d supply, fill #0
  Filled 2022-03-25: qty 3, 28d supply, fill #0
  Filled 2022-03-26: qty 3, 42d supply, fill #0
  Filled 2022-05-03: qty 3, 42d supply, fill #1

## 2022-03-25 NOTE — Progress Notes (Signed)
Care Coordination Call  Collaborated with Pharmacy at [facility]. Issues with completion of PA on her [facility] drug plan; PA was approved on her [facility] Medicaid plan. Team at [facility] attempted to call the plan to complete PA, but were unable to make headway. I have reached out to the pharmacy team at [facility] to see if they can provide any support.   Catie Eppie Gibson, PharmD, [facility] [phone]

## 2022-03-26 ENCOUNTER — Other Ambulatory Visit: Payer: Self-pay

## 2022-03-26 ENCOUNTER — Other Ambulatory Visit: Payer: Medicaid Other | Admitting: Pharmacist

## 2022-03-27 ENCOUNTER — Other Ambulatory Visit: Payer: Self-pay

## 2022-03-30 ENCOUNTER — Other Ambulatory Visit: Payer: Self-pay

## 2022-04-03 ENCOUNTER — Other Ambulatory Visit: Payer: Self-pay

## 2022-04-03 ENCOUNTER — Other Ambulatory Visit (HOSPITAL_COMMUNITY): Payer: Self-pay

## 2022-04-03 ENCOUNTER — Other Ambulatory Visit: Payer: Self-pay | Admitting: Nurse Practitioner

## 2022-04-03 ENCOUNTER — Telehealth: Payer: Self-pay | Admitting: Nurse Practitioner

## 2022-04-03 DIAGNOSIS — R110 Nausea: Secondary | ICD-10-CM

## 2022-04-03 MED ORDER — ONDANSETRON HCL 4 MG PO TABS
4.0000 mg | ORAL_TABLET | Freq: Three times a day (TID) | ORAL | 0 refills | Status: DC | PRN
  Filled 2022-04-03: qty 20, 7d supply, fill #0

## 2022-04-03 NOTE — Telephone Encounter (Signed)
Pt has been vomiting everyday since she started the ozempic . Pt is only on week one and is due for her shot tomorrow. Please advise if she can continue with something for vomiting or she should stop. KH

## 2022-04-03 NOTE — Telephone Encounter (Signed)
Pt was advised Wanda Weaver 

## 2022-04-03 NOTE — Telephone Encounter (Signed)
Pt called stating she is getting nuaseaus and throwing up from her ozempic. Please call pt at [phone] she would like to talk about not taking it/ alternative

## 2022-04-14 ENCOUNTER — Other Ambulatory Visit: Payer: Self-pay

## 2022-04-15 ENCOUNTER — Other Ambulatory Visit: Payer: Self-pay

## 2022-04-16 ENCOUNTER — Other Ambulatory Visit: Payer: Medicaid Other | Admitting: Pharmacist

## 2022-04-17 ENCOUNTER — Other Ambulatory Visit: Payer: Self-pay

## 2022-04-17 ENCOUNTER — Telehealth: Payer: Self-pay

## 2022-04-17 NOTE — Progress Notes (Signed)
   Care Guide Note  04/17/2022 Name: Wanda Weaver MRN: [id number] DOB: 1960/06/10  Referred by: [name], NP Reason for referral : Care Coordination (Outreach to reschedule f/u with Pharm D due to being out of office )   Wanda Weaver is a 62 y.o. year old female who is a primary care patient of [name], NP. Wanda Weaver was referred to the pharmacist for assistance related to DM.    An unsuccessful telephone outreach was attempted today to contact the patient who was referred to the pharmacy team for assistance with medication management. Additional attempts will be made to contact the patient.   Wanda Weaver, Wanda Weaver, Wanda Weaver [address] Direct Dial: [phone] [email or web address]

## 2022-04-20 ENCOUNTER — Telehealth: Payer: Self-pay

## 2022-04-20 NOTE — Telephone Encounter (Signed)
Lvm for pt to call back regarding her call to the office. [place]

## 2022-04-22 ENCOUNTER — Other Ambulatory Visit: Payer: Self-pay

## 2022-04-22 ENCOUNTER — Encounter: Payer: Self-pay | Admitting: Nurse Practitioner

## 2022-04-22 ENCOUNTER — Ambulatory Visit (INDEPENDENT_AMBULATORY_CARE_PROVIDER_SITE_OTHER): Payer: Medicaid Other | Admitting: Nurse Practitioner

## 2022-04-22 VITALS — BP 152/52 | HR 77 | Temp 96.8°F | Wt 201.4 lb

## 2022-04-22 DIAGNOSIS — Z1211 Encounter for screening for malignant neoplasm of colon: Secondary | ICD-10-CM

## 2022-04-22 DIAGNOSIS — M19011 Primary osteoarthritis, right shoulder: Secondary | ICD-10-CM

## 2022-04-22 DIAGNOSIS — Z1231 Encounter for screening mammogram for malignant neoplasm of breast: Secondary | ICD-10-CM | POA: Diagnosis not present

## 2022-04-22 DIAGNOSIS — I10 Essential (primary) hypertension: Secondary | ICD-10-CM

## 2022-04-22 DIAGNOSIS — R7309 Other abnormal glucose: Secondary | ICD-10-CM

## 2022-04-22 MED ORDER — LOSARTAN POTASSIUM 25 MG PO TABS
25.0000 mg | ORAL_TABLET | Freq: Every day | ORAL | 0 refills | Status: DC
  Filled 2022-04-22: qty 90, 90d supply, fill #0

## 2022-04-22 MED ORDER — KETOROLAC TROMETHAMINE 30 MG/ML IJ SOLN
30.0000 mg | Freq: Once | INTRAMUSCULAR | Status: AC
  Administered 2022-04-22: 30 mg via INTRAMUSCULAR

## 2022-04-22 MED ORDER — IBUPROFEN 600 MG PO TABS
600.0000 mg | ORAL_TABLET | Freq: Three times a day (TID) | ORAL | 0 refills | Status: DC | PRN
  Filled 2022-04-22: qty 30, 10d supply, fill #0

## 2022-04-22 NOTE — Patient Instructions (Signed)
1. Acute pain of right shoulder  - ibuprofen (ADVIL) 600 MG tablet; Take 1 tablet (600 mg total) by mouth every 8 (eight) hours as needed.  Dispense: 30 tablet; Refill: 0 alternate with tylenol 650mg  every 6 hours as needed. You were given toradol 30mg  injection today   2. Essential hypertension  - losartan (COZAAR) 25 MG tablet; Take 1 tablet (25 mg total) by mouth daily.  Dispense: 90 tablet; Refill: 0  Call [phone] to schedule your mammogram   Around 3 times per week, check your blood pressure 2 times per day. once in the morning and once in the evening. The readings should be at least one minute apart. Write down these values and bring them to your next nurse visit/appointment.  When you check your BP, make sure you have been doing something calm/relaxing 5 minutes prior to checking. Both feet should be flat on the floor and you should be sitting. Use your left arm and make sure it is in a relaxed position (on a table), and that the cuff is at the approximate level/height of your heart.   Blood pressure goal is less than 130/80   It is important that you exercise regularly at least 30 minutes 5 times a week as tolerated  Think about what you will eat, plan ahead. Choose " clean, green, fresh or frozen" over canned, processed or packaged foods which are more sugary, salty and fatty. 70 to 75% of food eaten should be vegetables and fruit. Three meals at set times with snacks allowed between meals, but they must be fruit or vegetables. Aim to eat over a 12 hour period , example 7 am to 7 pm, and STOP after  your last meal of the day. Drink water,generally about 64 ounces per day, no other drink is as healthy. Fruit juice is best enjoyed in a healthy way, by EATING the fruit.  Thanks for choosing [facility] we consider it a privelige to serve you.

## 2022-04-22 NOTE — Assessment & Plan Note (Signed)
Lab Results  Component Value Date   HGBA1C 7.7 (A) 02/05/2022  Currently on metformin 1000 mg twice daily, Lantus 40 units daily.  Stated that she stopped taking Ozempic due to GI upset.  She also reported hypoglycemia when she was taking Ozempic. Patient encouraged to follow-up with PCP as planned for medication management She was counseled on low-carb modified diet.

## 2022-04-22 NOTE — Progress Notes (Addendum)
Acute Office Visit  Subjective:     Patient ID: Wanda Weaver, female    DOB: [birth year], 61 y.o.   MRN: [id number]  Chief Complaint  Patient presents with   Shoulder Pain    For about three months    HPI Wanda Weaver with past medical history of GERD, type 2 diabetes, hypertension, osteoarthritis of right shoulder, presents with complaints of right shoulder pain .she stated that her right shoulder pain is worse with movement .she has been using OTC pain ointment, application of heat and ice as needed without relief .she does a lot of carrying things at work.  Currently has aching pain rated 7/10 .  She denies fever, chills, trauma,swelling, redness, numbness, tingling   Hypertension.  She is currently on metoprolol-hydrochlorothiazide 50-25 mg 1 tablet daily.  She denies chest pain, dizziness, edema.  States that she was told that her current blood pressure medication will not be covered by medicaid.        Review of Systems  Constitutional: Negative.  Negative for chills, fever, malaise/fatigue and weight loss.  Respiratory:  Negative for cough, hemoptysis and sputum production.   Cardiovascular: Negative.  Negative for chest pain, palpitations, orthopnea, claudication and leg swelling.  Musculoskeletal:  Positive for joint pain. Negative for back pain, falls and neck pain.  Neurological: Negative.  Negative for dizziness, tingling, tremors, sensory change and headaches.  Psychiatric/Behavioral: Negative.  Negative for depression, hallucinations, substance abuse and suicidal ideas.         Objective:    BP (!) 152/52   Pulse 77   Temp (!) 96.8 F (36 C)   Wt 201 lb 6.4 oz (91.4 kg)   SpO2 100%   BMI 36.84 kg/m    Physical Exam Constitutional:      General: She is not in acute distress.    Appearance: She is obese. She is not ill-appearing, toxic-appearing or diaphoretic.  Eyes:     General: No scleral icterus.       Right eye: No discharge.        Left eye: No  discharge.     Extraocular Movements: Extraocular movements intact.  Cardiovascular:     Rate and Rhythm: Normal rate and regular rhythm.     Pulses: Normal pulses.     Heart sounds: Normal heart sounds. No murmur heard.    No friction rub. No gallop.  Pulmonary:     Effort: Pulmonary effort is normal. No respiratory distress.     Breath sounds: Normal breath sounds. No stridor. No wheezing, rhonchi or rales.  Chest:     Chest wall: No tenderness.  Abdominal:     General: There is no distension.     Palpations: There is no mass.     Tenderness: There is no abdominal tenderness. There is no guarding or rebound.     Hernia: No hernia is present.  Musculoskeletal:        General: Tenderness present. No swelling, deformity or signs of injury.     Comments: Tenderness on range of motion of right shoulder.  No redness or swelling noted.  Skin warm and dry has palpable radial pulse  Skin:    General: Skin is warm and dry.     Capillary Refill: Capillary refill takes less than 2 seconds.     Coloration: Skin is not jaundiced.     Findings: No bruising or lesion.  Neurological:     Mental Status: She is alert. She is disoriented.  Cranial Nerves: No cranial nerve deficit.     Sensory: No sensory deficit.     Motor: No weakness.     Gait: Gait normal.  Psychiatric:        Mood and Affect: Mood normal.        Behavior: Behavior normal.        Thought Content: Thought content normal.        Judgment: Judgment normal.     No results found for any visits on 04/22/22.      Assessment & Plan:   Problem List Items Addressed This Visit       Cardiovascular and Mediastinum   Essential hypertension     BP Readings from Last 3 Encounters:  04/22/22 (!) 152/52  02/05/22 (!) 146/80  11/14/21 (!) 160/78  Chronic uncontrolled condition Currently on metoprolol-hydrochlorothiazide 50-25 mg 1 tablet daily, will await recommendations from pharmacy regarding this med since she states  that her insurance will no more cover this med. .  Start losartan 25 mg daily, continue current dose of metoprolol-hydrochlorothiazide 50-25 mg 1 tablet daily Blood pressure goal is less than 130/80 Follow-up in 2 weeks Will need BMP at that visit DASH diet advised engage in regular moderate exercises at least 150 minutes weekly      Relevant Medications   losartan (COZAAR) 25 MG tablet     Endocrine   Hemoglobin A1C greater than 9%, indicating poor diabetic control    Lab Results  Component Value Date   HGBA1C 7.7 (A) 02/05/2022  Currently on metformin 1000 mg twice daily, Lantus 40 units daily.  Stated that she stopped taking Ozempic due to GI upset.  She also reported hypoglycemia when she was taking Ozempic. Patient encouraged to follow-up with PCP as planned for medication management She was counseled on low-carb modified diet.       Relevant Medications   losartan (COZAAR) 25 MG tablet     Musculoskeletal and Integument   Osteoarthritis of right shoulder - Primary    Most recent x-ray of right shoulder shows 1. No acute radiographic abnormality of the right shoulder. 2. 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   -Take ibuprofen (ADVIL) 600 MG tablet; Take 1 tablet (600 mg total) by mouth every 8 (eight) hours as needed.  Alternate with Tylenol 650 mg every 6 hours as needed.  Continue application of heat or ice as needed - ketorolac (TORADOL) 30 MG/ML injection 30 mg one-time dose given in the office today       Relevant Medications   ibuprofen (ADVIL) 600 MG tablet   Other Visit Diagnoses     Encounter for screening mammogram for malignant neoplasm of breast       Relevant Orders   MM Digital Screening   Screening for colon cancer       Relevant Orders   Cologuard       Meds ordered this encounter  Medications   losartan (COZAAR) 25 MG tablet    Sig: Take 1 tablet (25 mg total) by mouth daily.    Dispense:   90 tablet    Refill:  0   ibuprofen (ADVIL) 600 MG tablet    Sig: Take 1 tablet (600 mg total) by mouth every 8 (eight) hours as needed.    Dispense:  30 tablet    Refill:  0   ketorolac (TORADOL) 30 MG/ML injection 30 mg    Return in about 3 weeks (around 05/13/2022).  Renee Rival,  FNP

## 2022-04-22 NOTE — Assessment & Plan Note (Signed)
  BP Readings from Last 3 Encounters:  04/22/22 (!) 152/52  02/05/22 (!) 146/80  11/14/21 (!) 160/78  Chronic uncontrolled condition Currently on metoprolol-hydrochlorothiazide 50-25 mg 1 tablet daily, will await recommendations from pharmacy regarding this med since she states that her insurance will no more cover this med. .  Start losartan 25 mg daily, continue current dose of metoprolol-hydrochlorothiazide 50-25 mg 1 tablet daily Blood pressure goal is less than 130/80 Follow-up in 2 weeks Will need BMP at that visit DASH diet advised engage in regular moderate exercises at least 150 minutes weekly

## 2022-04-22 NOTE — Assessment & Plan Note (Signed)
Most recent x-ray of right shoulder shows 1. No acute radiographic abnormality of the right shoulder. 2. 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   -Take ibuprofen (ADVIL) 600 MG tablet; Take 1 tablet (600 mg total) by mouth every 8 (eight) hours as needed.  Alternate with Tylenol 650 mg every 6 hours as needed.  Continue application of heat or ice as needed - ketorolac (TORADOL) 30 MG/ML injection 30 mg one-time dose given in the office today

## 2022-04-23 ENCOUNTER — Other Ambulatory Visit (HOSPITAL_COMMUNITY): Payer: Self-pay

## 2022-04-23 ENCOUNTER — Other Ambulatory Visit: Payer: Self-pay

## 2022-04-30 ENCOUNTER — Other Ambulatory Visit: Payer: Self-pay | Admitting: Nurse Practitioner

## 2022-04-30 DIAGNOSIS — G8929 Other chronic pain: Secondary | ICD-10-CM

## 2022-05-01 ENCOUNTER — Ambulatory Visit (HOSPITAL_COMMUNITY)
Admission: RE | Admit: 2022-05-01 | Discharge: 2022-05-01 | Disposition: A | Discharge status: Home / Self Care | Payer: Medicaid Other | Source: Ambulatory Visit | Attending: Nurse Practitioner | Admitting: Nurse Practitioner

## 2022-05-01 DIAGNOSIS — G8929 Other chronic pain: Secondary | ICD-10-CM | POA: Insufficient documentation

## 2022-05-01 DIAGNOSIS — M25511 Pain in right shoulder: Secondary | ICD-10-CM | POA: Insufficient documentation

## 2022-05-01 NOTE — Progress Notes (Signed)
   Care Guide Note  05/01/2022 Name: Wanda Weaver MRN: [id number] DOB: [birth year]  Referred by: [name], NP Reason for referral : Care Coordination (Outreach to reschedule f/u with Pharm D due to being out of office )   Wanda Weaver is a 62 y.o. year old female who is a primary care patient of [name], NP. Wanda Weaver was referred to the pharmacist for assistance related to DM.    Successful contact was made with the patient to discuss pharmacy services including being ready for the pharmacist to call at least 5 minutes before the scheduled appointment time, to have medication bottles and any blood sugar or blood pressure readings ready for review. The patient agreed to meet with the pharmacist via with the pharmacist via telephone visit on (date/time).  05/19/2021  [name], [place], [place] [address] Direct Dial: [phone] [email or web address]

## 2022-05-02 LAB — HM DIABETES EYE EXAM

## 2022-05-04 ENCOUNTER — Other Ambulatory Visit: Payer: Self-pay

## 2022-05-06 ENCOUNTER — Other Ambulatory Visit: Payer: Self-pay | Admitting: Nurse Practitioner

## 2022-05-06 ENCOUNTER — Other Ambulatory Visit: Payer: Self-pay

## 2022-05-06 DIAGNOSIS — M25511 Pain in right shoulder: Secondary | ICD-10-CM

## 2022-05-06 DIAGNOSIS — R936 Abnormal findings on diagnostic imaging of limbs: Secondary | ICD-10-CM

## 2022-05-07 ENCOUNTER — Ambulatory Visit: Payer: Self-pay | Admitting: Nurse Practitioner

## 2022-05-07 ENCOUNTER — Other Ambulatory Visit: Payer: Self-pay

## 2022-05-08 ENCOUNTER — Ambulatory Visit (INDEPENDENT_AMBULATORY_CARE_PROVIDER_SITE_OTHER): Payer: Medicaid Other | Admitting: Nurse Practitioner

## 2022-05-08 ENCOUNTER — Other Ambulatory Visit: Payer: Self-pay

## 2022-05-08 ENCOUNTER — Encounter: Payer: Self-pay | Admitting: Nurse Practitioner

## 2022-05-08 ENCOUNTER — Ambulatory Visit: Payer: Self-pay | Admitting: Nurse Practitioner

## 2022-05-08 VITALS — BP 170/73 | HR 87 | Temp 97.6°F | Ht 60.25 in | Wt 202.4 lb

## 2022-05-08 DIAGNOSIS — I10 Essential (primary) hypertension: Secondary | ICD-10-CM | POA: Diagnosis not present

## 2022-05-08 DIAGNOSIS — Z794 Long term (current) use of insulin: Secondary | ICD-10-CM

## 2022-05-08 DIAGNOSIS — Z13220 Encounter for screening for lipoid disorders: Secondary | ICD-10-CM | POA: Diagnosis not present

## 2022-05-08 DIAGNOSIS — E118 Type 2 diabetes mellitus with unspecified complications: Secondary | ICD-10-CM

## 2022-05-08 LAB — POCT GLYCOSYLATED HEMOGLOBIN (HGB A1C)
HbA1c POC (<> result, manual entry): 7.9 % (ref 4.0–5.6)
HbA1c, POC (controlled diabetic range): 7.9 % — AB (ref 0.0–7.0)
HbA1c, POC (prediabetic range): 7.9 % — AB (ref 5.7–6.4)
Hemoglobin A1C: 7.9 % — AB (ref 4.0–5.6)

## 2022-05-08 MED ORDER — CYCLOBENZAPRINE HCL 10 MG PO TABS
10.0000 mg | ORAL_TABLET | Freq: Two times a day (BID) | ORAL | 0 refills | Status: DC | PRN
  Filled 2022-05-08: qty 20, 10d supply, fill #0

## 2022-05-08 MED ORDER — INSULIN GLARGINE 100 UNIT/ML ~~LOC~~ SOLN
40.0000 [IU] | Freq: Every day | SUBCUTANEOUS | 11 refills | Status: DC
  Filled 2022-05-08: qty 10, 25d supply, fill #0

## 2022-05-08 MED ORDER — "INSULIN SYRINGE-NEEDLE U-100 31G X 5/16"" 0.5 ML MISC"
1.0000 | Freq: Once | 0 refills | Status: AC
  Filled 2022-05-08: qty 100, 90d supply, fill #0

## 2022-05-08 MED ORDER — METOPROLOL SUCCINATE ER 50 MG PO TB24
50.0000 mg | ORAL_TABLET | Freq: Every day | ORAL | 3 refills | Status: DC
  Filled 2022-05-08: qty 90, 90d supply, fill #0

## 2022-05-08 MED ORDER — LANTUS SOLOSTAR 100 UNIT/ML ~~LOC~~ SOPN: 40.0000 [IU] | PEN_INJECTOR | Freq: Every day | SUBCUTANEOUS | 99 refills | Status: DC

## 2022-05-08 MED ORDER — HYDROCHLOROTHIAZIDE 25 MG PO TABS
25.0000 mg | ORAL_TABLET | Freq: Every day | ORAL | 2 refills | Status: DC
  Filled 2022-05-08: qty 30, 30d supply, fill #0
  Filled 2022-06-15: qty 30, 30d supply, fill #1
  Filled 2022-07-20: qty 30, 30d supply, fill #2

## 2022-05-08 NOTE — Progress Notes (Signed)
@Patient  ID: Wanda Weaver, female    DOB: 13-Apr-1961, 62 y.o.   MRN: [id number]  Chief Complaint  Patient presents with   Follow-up    Needs rx for vials and syringes instead of pens please. She can't use the pens. Medicaid wont cover metoprolol hctz either will need it sent as 2 medications. FBS this am is 98 and she has had a migraine  for 2 days. Not taking ozempic made her vomit for days    Referring provider: [name], NP   HPI  Wanda Weaver is a 62 y.o. female who presents for follow-up of Type 2 diabetes mellitus. Has a past medical history of Bilateral lower extremity edema (07/2019), Bilateral lower extremity pain (07/2019), Diabetes mellitus without complication ([facility]), Hemoglobin A1C greater than 9%, indicating poor diabetic control, Hyperlipidemia, Hypertension, and Vitamin D deficiency.    Patient is checking home blood sugars.   Home blood sugar records: BGs have been labile ranging between 98 and 200 How often is blood sugars being checked: three times a day Current symptoms/problems include nausea and vomitting and have been unchanged. Daily foot checks: yes   Any foot concerns: none Last eye exam: 08/19/2017 Dr. Herbert Deaner  Exercise: The patient has a physically strenuous job, but has no regular exercise apart from work.   A1c in office today 7.9.  Patient does need refills on medications per noted above.  Blood pressure is stable.  Patient does have upcoming appointment with orthopedics for ongoing shoulder pain.  Would like a work for note today.  Denies f/c/s, n/v/d, hemoptysis, PND, leg swelling Denies chest pain or edema      No Known Allergies  Immunization History  Administered Date(s) Administered   Pneumococcal Polysaccharide-23 08/06/2017    Past Medical History:  Diagnosis Date   Bilateral lower extremity edema 07/2019   Bilateral lower extremity pain 07/2019   Diabetes mellitus without complication (HCC)    Hemoglobin A1C greater than 9%,  indicating poor diabetic control    Hyperlipidemia    Hypertension    Vitamin D deficiency     Tobacco History: Social History   Tobacco Use  Smoking Status Never  Smokeless Tobacco Never   Counseling given: Not Answered   Outpatient Encounter Medications as of 05/08/2022  Medication Sig   hydrochlorothiazide (HYDRODIURIL) 25 MG tablet Take 1 tablet (25 mg total) by mouth daily.   ibuprofen (ADVIL) 600 MG tablet Take 1 tablet (600 mg total) by mouth every 8 (eight) hours as needed.   insulin glargine (LANTUS) 100 UNIT/ML injection Inject 0.4 mLs (40 Units total) into the skin daily.   Insulin Syringe-Needle U-100 (TRUEPLUS INSULIN SYRINGE) 31G X 5/16" 0.5 ML MISC USE AS DIRECTED   losartan (COZAAR) 25 MG tablet Take 1 tablet (25 mg total) by mouth daily.   metFORMIN (GLUCOPHAGE-XR) 500 MG 24 hr tablet Take 2 tablets (1,000 mg total) by mouth 2 (two) times daily with a meal.   metoprolol succinate (TOPROL-XL) 50 MG 24 hr tablet Take 1 tablet (50 mg total) by mouth daily. Take with or immediately following a meal.   omeprazole (PRILOSEC) 20 MG capsule Take 1 capsule (20 mg total) by mouth daily.   ondansetron (ZOFRAN) 4 MG tablet Take 1 tablet (4 mg total) by mouth every 8 (eight) hours as needed for nausea or vomiting.   rosuvastatin (CRESTOR) 20 MG tablet Take 1 tablet (20 mg total) by mouth daily.   [DISCONTINUED] insulin glargine (LANTUS SOLOSTAR) 100 UNIT/ML Solostar Pen Inject  40 Units into the skin daily.   [DISCONTINUED] insulin glargine (LANTUS SOLOSTAR) 100 UNIT/ML Solostar Pen Inject 40 Units into the skin daily.   Accu-Chek Softclix Lancets lancets Use as instructed   acetaminophen (TYLENOL) 500 MG tablet Take 500 mg by mouth every 6 (six) hours as needed.   albuterol (VENTOLIN HFA) 108 (90 Base) MCG/ACT inhaler Inhale 2 puffs into the lungs every 6 (six) hours as needed.   blood glucose meter kit and supplies KIT Use up to four times daily as directed.   cyclobenzaprine  (FLEXERIL) 10 MG tablet Take 1 tablet (10 mg total) by mouth 2 (two) times daily as needed for muscle spasms.   Elastic Bandages & Supports (KNEE BRACE ADJUSTABLE HINGED) MISC 1 application by Does not apply route daily.   glucose blood (ACCU-CHEK GUIDE) test strip Use as instructed   [DISCONTINUED] albuterol (VENTOLIN HFA) 108 (90 Base) MCG/ACT inhaler Inhale 2 puffs into the lungs every 6 (six) hours as needed for wheezing or shortness of breath.   [DISCONTINUED] cyclobenzaprine (FLEXERIL) 10 MG tablet Take 1 tablet (10 mg total) by mouth 2 (two) times daily as needed for muscle spasms. (Patient not taking: Reported on 05/08/2022)   [DISCONTINUED] Insulin Pen Needle (PEN NEEDLES) 32G X 4 MM MISC Use to inject insulin daily   [DISCONTINUED] metoprolol-hydrochlorothiazide (LOPRESSOR HCT) 50-25 MG tablet Take 1 tablet by mouth once daily. (Patient not taking: Reported on 05/08/2022)   [DISCONTINUED] Multiple Vitamin (MULTIVITAMIN) tablet Take 1 tablet by mouth daily. (Patient not taking: Reported on 05/08/2022)   [DISCONTINUED] Semaglutide,0.25 or 0.5MG /DOS, (OZEMPIC, 0.25 OR 0.5 MG/DOSE,) 2 MG/3ML SOPN Inject 0.25 mg weekly for 4 weeks, then increase to 0.5 mg weekly. (Patient not taking: Reported on 04/22/2022)   No facility-administered encounter medications on file as of 05/08/2022.     Review of Systems  Review of Systems  Constitutional: Negative.   HENT: Negative.    Cardiovascular: Negative.   Gastrointestinal: Negative.   Allergic/Immunologic: Negative.   Neurological: Negative.   Psychiatric/Behavioral: Negative.         Physical Exam  BP (!) 170/73   Pulse 87   Temp 97.6 F (36.4 C) (Temporal)   Ht 5' 0.25" (1.53 m)   Wt 202 lb 6.4 oz (91.8 kg)   SpO2 100%   BMI 39.20 kg/m   Wt Readings from Last 5 Encounters:  05/08/22 202 lb 6.4 oz (91.8 kg)  04/22/22 201 lb 6.4 oz (91.4 kg)  02/05/22 203 lb 6.4 oz (92.3 kg)  11/14/21 200 lb (90.7 kg)  11/05/21 206 lb (93.4 kg)      Physical Exam Vitals and nursing note reviewed.  Constitutional:      General: She is not in acute distress.    Appearance: She is well-developed.  Cardiovascular:     Rate and Rhythm: Normal rate and regular rhythm.  Pulmonary:     Effort: Pulmonary effort is normal.     Breath sounds: Normal breath sounds.  Neurological:     Mental Status: She is alert and oriented to person, place, and time.      Lab Results:  CBC    Component Value Date/Time   WBC 6.0 11/14/2021 1041   RBC 4.00 11/14/2021 1041   HGB 11.0 (L) 11/14/2021 1041   HGB 11.3 11/05/2021 1034   HCT 34.1 (L) 11/14/2021 1041   HCT 33.7 (L) 11/05/2021 1034   PLT 228 11/14/2021 1041   PLT 238 11/05/2021 1034   MCV 85.3 11/14/2021 1041  MCV 85 11/05/2021 1034   MCH 27.5 11/14/2021 1041   MCHC 32.3 11/14/2021 1041   RDW 12.6 11/14/2021 1041   RDW 12.2 11/05/2021 1034   LYMPHSABS 1.7 07/17/2020 1108   EOSABS 0.6 (H) 07/17/2020 1108   BASOSABS 0.1 07/17/2020 1108    BMET    Component Value Date/Time   NA 137 11/14/2021 1041   NA 138 11/05/2021 1034   K 4.0 11/14/2021 1041   CL 105 11/14/2021 1041   CO2 26 11/14/2021 1041   GLUCOSE 112 (H) 11/14/2021 1041   BUN 23 (H) 11/14/2021 1041   BUN 21 11/05/2021 1034   CREATININE 1.01 (H) 11/14/2021 1041   CALCIUM 9.0 11/14/2021 1041   GFRNONAA >60 11/14/2021 1041   GFRAA 98 06/30/2019 1544    BNP No results found for: "BNP"  ProBNP No results found for: "PROBNP"  Imaging: DG Shoulder Right  Result Date: 05/02/2022 CLINICAL DATA:  Right shoulder pain EXAM: RIGHT SHOULDER - 2+ VIEW COMPARISON:  Right shoulder x-ray 08/31/2021 FINDINGS: There is no fracture or dislocation. No focal osseous lesion. Glenohumeral joint space is well maintained. There are moderate degenerative changes of the acromioclavicular joint, similar to prior study. Soft tissues are within normal limits. IMPRESSION: Moderate degenerative changes of the acromioclavicular joint,  similar to prior study. Electronically Signed   By: Darliss Cheney M.D.   On: 05/02/2022 23:43     Assessment & Plan:   Type 2 diabetes mellitus with complication, with long-term current use of insulin (HCC) - Microalbumin/Creatinine Ratio, Urine - POCT glycosylated hemoglobin (Hb A1C) - insulin starter kit- pen needles MISC; 1 kit by Other route once for 1 dose.  Dispense: 1 kit; Refill: 0 - insulin glargine (LANTUS) 100 UNIT/ML injection; Inject 0.4 mLs (40 Units total) into the skin daily.  Dispense: 10 mL; Refill: 11 - Ambulatory referral to diabetic education - CBC - Comprehensive metabolic panel  2. Essential hypertension  - metoprolol succinate (TOPROL-XL) 50 MG 24 hr tablet; Take 1 tablet (50 mg total) by mouth daily. Take with or immediately following a meal.  Dispense: 90 tablet; Refill: 3 - hydrochlorothiazide (HYDRODIURIL) 25 MG tablet; Take 1 tablet (25 mg total) by mouth daily.  Dispense: 30 tablet; Refill: 2  3. Lipid screening  - Lipid Panel   Follow up:  Follow up in 3 months     [name], NP 05/08/2022

## 2022-05-08 NOTE — Patient Instructions (Addendum)
1. Type 2 diabetes mellitus with complication, with long-term current use of insulin (HCC)  - Microalbumin/Creatinine Ratio, Urine - POCT glycosylated hemoglobin (Hb A1C) - insulin starter kit- pen needles MISC; 1 kit by Other route once for 1 dose.  Dispense: 1 kit; Refill: 0 - insulin glargine (LANTUS) 100 UNIT/ML injection; Inject 0.4 mLs (40 Units total) into the skin daily.  Dispense: 10 mL; Refill: 11 - Ambulatory referral to diabetic education - CBC - Comprehensive metabolic panel  2. Essential hypertension  - metoprolol succinate (TOPROL-XL) 50 MG 24 hr tablet; Take 1 tablet (50 mg total) by mouth daily. Take with or immediately following a meal.  Dispense: 90 tablet; Refill: 3 - hydrochlorothiazide (HYDRODIURIL) 25 MG tablet; Take 1 tablet (25 mg total) by mouth daily.  Dispense: 30 tablet; Refill: 2  3. Lipid screening  - Lipid Panel   Follow up:  Follow up in 3 months

## 2022-05-08 NOTE — Assessment & Plan Note (Signed)
-  Microalbumin/Creatinine Ratio, Urine - POCT glycosylated hemoglobin (Hb A1C) - insulin starter kit- pen needles MISC; 1 kit by Other route once for 1 dose.  Dispense: 1 kit; Refill: 0 - insulin glargine (LANTUS) 100 UNIT/ML injection; Inject 0.4 mLs (40 Units total) into the skin daily.  Dispense: 10 mL; Refill: 11 - Ambulatory referral to diabetic education - CBC - Comprehensive metabolic panel  2. Essential hypertension  - metoprolol succinate (TOPROL-XL) 50 MG 24 hr tablet; Take 1 tablet (50 mg total) by mouth daily. Take with or immediately following a meal.  Dispense: 90 tablet; Refill: 3 - hydrochlorothiazide (HYDRODIURIL) 25 MG tablet; Take 1 tablet (25 mg total) by mouth daily.  Dispense: 30 tablet; Refill: 2  3. Lipid screening  - Lipid Panel   Follow up:  Follow up in 3 months

## 2022-05-09 LAB — CBC
Hematocrit: 34 % (ref 34.0–46.6)
Hemoglobin: 10.6 g/dL — ABNORMAL LOW (ref 11.1–15.9)
MCH: 26.4 pg — ABNORMAL LOW (ref 26.6–33.0)
MCHC: 31.2 g/dL — ABNORMAL LOW (ref 31.5–35.7)
MCV: 85 fL (ref 79–97)
Platelets: 253 10*3/uL (ref 150–450)
RBC: 4.01 x10E6/uL (ref 3.77–5.28)
RDW: 13.1 % (ref 11.7–15.4)
WBC: 4.3 10*3/uL (ref 3.4–10.8)

## 2022-05-09 LAB — COMPREHENSIVE METABOLIC PANEL
ALT: 14 IU/L (ref 0–32)
AST: 16 IU/L (ref 0–40)
Albumin/Globulin Ratio: 1.8 (ref 1.2–2.2)
Albumin: 3.9 g/dL (ref 3.8–4.9)
Alkaline Phosphatase: 94 IU/L (ref 44–121)
BUN/Creatinine Ratio: 14 (ref 12–28)
BUN: 13 mg/dL (ref 8–27)
Bilirubin Total: 0.4 mg/dL (ref 0.0–1.2)
CO2: 24 mmol/L (ref 20–29)
Calcium: 9.7 mg/dL (ref 8.7–10.3)
Chloride: 103 mmol/L (ref 96–106)
Creatinine, Ser: 0.96 mg/dL (ref 0.57–1.00)
Globulin, Total: 2.2 g/dL (ref 1.5–4.5)
Glucose: 107 mg/dL — ABNORMAL HIGH (ref 70–99)
Potassium: 4.4 mmol/L (ref 3.5–5.2)
Sodium: 140 mmol/L (ref 134–144)
Total Protein: 6.1 g/dL (ref 6.0–8.5)
eGFR: 68 mL/min/{1.73_m2} (ref >59)

## 2022-05-09 LAB — LIPID PANEL
Chol/HDL Ratio: 4.7 ratio — ABNORMAL HIGH (ref 0.0–4.4)
Cholesterol, Total: 221 mg/dL — ABNORMAL HIGH (ref 100–199)
HDL: 47 mg/dL (ref >39)
LDL Chol Calc (NIH): 150 mg/dL — ABNORMAL HIGH (ref 0–99)
Triglycerides: 131 mg/dL (ref 0–149)
VLDL Cholesterol Cal: 24 mg/dL (ref 5–40)

## 2022-05-11 ENCOUNTER — Telehealth: Payer: Self-pay

## 2022-05-11 ENCOUNTER — Other Ambulatory Visit: Payer: Self-pay

## 2022-05-11 LAB — MICROALBUMIN / CREATININE URINE RATIO
Creatinine, Urine: 32.8 mg/dL
Microalb/Creat Ratio: 45 mg/g creat — ABNORMAL HIGH (ref 0–29)
Microalbumin, Urine: 14.9 ug/mL

## 2022-05-11 NOTE — Telephone Encounter (Signed)
Patients daughter is a doctor and told her to all and ask about her hemoglobin and what we wanted her to do. She picked up Iron and B 12 but her daughter told her to call us first. Please advise on lab results.

## 2022-05-12 ENCOUNTER — Other Ambulatory Visit: Payer: Self-pay

## 2022-05-14 ENCOUNTER — Other Ambulatory Visit: Payer: Self-pay

## 2022-05-15 NOTE — Telephone Encounter (Signed)
HGB is slightly low. We will recheck this at next visit. May start OTC iron supplement.

## 2022-05-15 NOTE — Telephone Encounter (Signed)
Results have been relayed to the patient. The patient verbalized understanding. No questions at this time.  She has started vit b12  and an iron. She is having blood in stool. She can only come in on Fridays, I will make an appt soon or suggest urgent care.

## 2022-05-15 NOTE — Telephone Encounter (Signed)
Left message for patient to return call to office at [phone]. Also sent the patient a mychart message.

## 2022-05-15 NOTE — Telephone Encounter (Signed)
Agreed.  Thanks.  

## 2022-05-15 NOTE — Telephone Encounter (Signed)
Patient states that she is having blood in her stools. She states that she cannot come in for an appointment except on Fridays. An appt has been made for next Friday however the patient was advised that she should seek medical attention prior if she continues to have blood as it could be a more urgent matter.

## 2022-05-19 ENCOUNTER — Telehealth: Payer: Self-pay | Admitting: Pharmacist

## 2022-05-19 ENCOUNTER — Other Ambulatory Visit: Payer: Medicaid Other | Admitting: Pharmacist

## 2022-05-19 DIAGNOSIS — E118 Type 2 diabetes mellitus with unspecified complications: Secondary | ICD-10-CM

## 2022-05-19 NOTE — Progress Notes (Signed)
05/19/2022 Name: Wanda Weaver MRN: [id number] DOB: 1960-10-09  Chief Complaint  Patient presents with   Medication Management   Diabetes    Wanda Weaver is a 62 y.o. year old female who presented for a telephone visit.   They were referred to the pharmacist by their PCP for assistance in managing diabetes and hypertension.   Patient is participating in a Managed Medicaid Plan:  Yes  Subjective:  Care Team: Primary Care Provider: [name], NP ; Next Scheduled Visit: Friday  Medication Access/Adherence  Current Pharmacy:  [facility] [address] [place] [address] Phone: [phone] Fax: [phone]   Patient reports affordability concerns with their medications: No  Patient reports access/transportation concerns to their pharmacy: No  Patient reports adherence concerns with their medications:  No    Reports she did have an episode of hard stool/straining and bright red blood in the stool. This was after stopping Ozempic and she thinks it was before starting iron. Denies worsening constipation since starting iron, but still counseled on side effect of constipation and darkening stools. Encouraged increased fiber and hydration and to discuss with PCP at upcoming visit.   Diabetes:  Current medications: metformin XR 1000 mg twice daily, Lantus 40 units daily  Medications tried in the past: Ozempic - severe nausea/stomach upset and vomiting  Current glucose readings: fastings/midday 80-112s; reports symptoms of hypoglycemia   Patient reports hypoglycemic s/sx including dizziness, shakiness, sweating, even when sugars are fairly well controlled 80-100s.  Current meal patterns: reports significant dietary changes; working on decreasing sugar intake.   Notes that she has a hard time injecting pen, so has switched back to syringes.   Hypertension:  Current medications: losartan 25 mg daily, metoprolol  succinate 50 mg daily, HCTZ 25 mg daily  Patient has an automated, upper arm home BP cuff Current blood pressure readings readings: reports 144/65, unclear if cuff has been validated   Patient reports hypotensive s/sx including dizziness, lightheadedness when she first restarted medications, but is feeling better now. Does attribute recent headaches to stress at work. Was off medications for a few weeks while getting switched to medications that are on Medicaid formulary.   Hyperlipidemia/ASCVD Risk Reduction  Current lipid lowering medications: rosuvastatin 20 mg - confirms adherence for the ~ 6 weeks leading up to lipid panel   Objective:  Lab Results  Component Value Date   HGBA1C 7.9 (A) 05/08/2022   HGBA1C 7.9 05/08/2022   HGBA1C 7.9 (A) 05/08/2022   HGBA1C 7.9 (A) 05/08/2022    Lab Results  Component Value Date   CREATININE 0.96 05/08/2022   BUN 13 05/08/2022   NA 140 05/08/2022   K 4.4 05/08/2022   CL 103 05/08/2022   CO2 24 05/08/2022    Lab Results  Component Value Date   CHOL 221 (H) 05/08/2022   HDL 47 05/08/2022   LDLCALC 150 (H) 05/08/2022   TRIG 131 05/08/2022   CHOLHDL 4.7 (H) 05/08/2022    Medications Reviewed Today     Reviewed by Osker Mason, RPH-CPP (Pharmacist) on 05/19/22 at 91  Med List Status: <None>   Medication Order Taking? Sig Documenting Provider Last Dose Status Informant  Accu-Chek Softclix Lancets lancets [id number]  Use as instructed [name], NP  Active   acetaminophen (TYLENOL) 500 MG tablet [id number]  Take 500 mg by mouth every 6 (six) hours as needed. [provider]  Active  Med Note Elyse Jarvis   Wed Apr 22, 2022  8:54 AM) Prn   albuterol (VENTOLIN HFA) 108 [phone] Base) MCG/ACT inhaler [id number]  Inhale 2 puffs into the lungs every 6 (six) hours as needed. [name], NP  Active   blood glucose meter kit and supplies KIT [id number]  Use up to four times daily as directed. [name], NP  Active   cyclobenzaprine (FLEXERIL) 10 MG tablet [id number]  Take 1 tablet (10 mg total) by mouth 2 (two) times daily as needed for muscle spasms. [name], NP  Active   Elastic Bandages & Supports (KNEE BRACE ADJUSTABLE HINGED) MISC [id number]  1 application by Does not apply route daily. [name], NP  Active   glucose blood (ACCU-CHEK GUIDE) test strip [id number]  Use as instructed [name], NP  Active   hydrochlorothiazide (HYDRODIURIL) 25 MG tablet [id number] Yes Take 1 tablet (25 mg total) by mouth daily. [name], NP Taking Active   ibuprofen (ADVIL) 600 MG tablet [id number]  Take 1 tablet (600 mg total) by mouth every 8 (eight) hours as needed. Renee Rival, FNP  Active   insulin glargine (LANTUS) 100 UNIT/ML injection [id number] Yes Inject 0.4 mLs (40 Units total) into the skin daily. [name], NP Taking Active   losartan (COZAAR) 25 MG tablet [id number] Yes Take 1 tablet (25 mg total) by mouth daily. Renee Rival, FNP Taking Active   metFORMIN (GLUCOPHAGE-XR) 500 MG 24 hr tablet [id number] Yes Take 2 tablets (1,000 mg total) by mouth 2 (two) times daily with a meal. [name], NP Taking Active   metoprolol succinate (TOPROL-XL) 50 MG 24 hr tablet [id number] Yes Take 1 tablet (50 mg total) by mouth daily. Take with or immediately following a meal. [name], NP Taking Active   omeprazole (PRILOSEC) 20 MG capsule [id number]  Take 1 capsule (20 mg total) by mouth daily. [name], NP  Active   ondansetron (ZOFRAN) 4 MG tablet [id number]  Take 1 tablet (4 mg total) by mouth every 8 (eight) hours as needed for nausea or vomiting. Renee Rival, FNP  Active            Med Note Elyse Jarvis   Wed Apr 22, 2022  8:57 AM) prn  rosuvastatin (CRESTOR) 20 MG tablet [id number] Yes Take 1 tablet (20 mg total) by mouth daily. [name], NP Taking Active               Assessment/Plan:   Diabetes: - Currently  uncontrolled - Reviewed long term cardiovascular and renal outcomes of uncontrolled blood sugar - Reviewed goal A1c, goal fasting, and goal 2 hour post prandial glucose - Given continued symptomatic hypoglycemia, recommend to reduce Lantus to 34 units. Also discussed addition of SGLT2 for renal protection. Patient amenable. Will discuss with PCP.  - Recommend to check glucose 2-3 times daily.   Hypertension: - Currently uncontrolled per home readings - Reviewed long term cardiovascular and renal outcomes of uncontrolled blood pressure - Reviewed appropriate blood pressure monitoring technique and reviewed goal blood pressure. Recommended to check home blood pressure and heart rate periodically - Recommend to continue current regimen at this time. Will follow for need to increase dose of ARB moving forward.     Hyperlipidemia/ASCVD Risk Reduction: - Currently uncontrolled.  - Recommend to increase rosuvastatin dose to 40 mg daily. Likely will need additional therapy to reach goal LDL <70, but recommend  starting with maximizing statin. Will discuss with PCP.    Will collaborate with Managed Medicaid care management team for community resource support, disease state management, and mental health support  Follow Up Plan: phone call in ~ 5 weeks  Catie Hedwig Morton, PharmD, [place], [facility] [phone]

## 2022-05-19 NOTE — Patient Instructions (Signed)
Sheralee,   It was great talking to you today!  Decrease Lantus to 34 units daily.   Let's start Jardiance 10 mg daily. Take this medication in the morning. Make sure you stay well hydrated - it works by causing you to pee out sugar, so you can get dehydrated. If you have a day in the future where you are sick (nausea, diarrhea) and you get dehydrated, please STOP this medication until you feel better.   Check your blood sugars twice daily:  1) Fasting, first thing in the morning before breakfast and  2) 2 hours after your largest meal.   For a goal A1c of less than 7%, goal fasting readings are less than 130 and goal 2 hour after meal readings are less than 180.    Take care!  Catie Hedwig Morton, PharmD, [place], [facility] [phone]

## 2022-05-19 NOTE — Progress Notes (Unsigned)
Attempted to contact patient for scheduled appointment for medication management. Left HIPAA compliant message for patient to return my call at their convenience.   Will also send MyChart message  Catie T. Pratham Cassatt, PharmD, BCACP, CPP [facility] [phone]  

## 2022-05-21 ENCOUNTER — Other Ambulatory Visit: Payer: Self-pay

## 2022-05-21 MED ORDER — ROSUVASTATIN CALCIUM 40 MG PO TABS
40.0000 mg | ORAL_TABLET | Freq: Every day | ORAL | 1 refills | Status: DC
  Filled 2022-05-21: qty 90, 90d supply, fill #0

## 2022-05-21 MED ORDER — EMPAGLIFLOZIN 10 MG PO TABS
10.0000 mg | ORAL_TABLET | Freq: Every day | ORAL | 1 refills | Status: DC
  Filled 2022-05-21: qty 90, 90d supply, fill #0

## 2022-05-21 MED ORDER — INSULIN GLARGINE 100 UNIT/ML ~~LOC~~ SOLN
34.0000 [IU] | Freq: Every day | SUBCUTANEOUS | 11 refills | Status: DC
  Filled 2022-05-21: qty 10, 28d supply, fill #0
  Filled 2022-07-12 – 2022-07-20 (×2): qty 10, 28d supply, fill #1

## 2022-05-22 ENCOUNTER — Ambulatory Visit: Payer: Medicaid Other | Admitting: Orthopedic Surgery

## 2022-05-22 ENCOUNTER — Encounter: Payer: Self-pay | Admitting: Nurse Practitioner

## 2022-05-22 ENCOUNTER — Other Ambulatory Visit: Payer: Self-pay

## 2022-05-22 ENCOUNTER — Ambulatory Visit (INDEPENDENT_AMBULATORY_CARE_PROVIDER_SITE_OTHER): Payer: Medicaid Other | Admitting: Nurse Practitioner

## 2022-05-22 VITALS — BP 147/53 | HR 84 | Temp 97.5°F

## 2022-05-22 DIAGNOSIS — M25511 Pain in right shoulder: Secondary | ICD-10-CM | POA: Diagnosis not present

## 2022-05-22 DIAGNOSIS — F419 Anxiety disorder, unspecified: Secondary | ICD-10-CM

## 2022-05-22 DIAGNOSIS — K649 Unspecified hemorrhoids: Secondary | ICD-10-CM | POA: Diagnosis not present

## 2022-05-22 DIAGNOSIS — K921 Melena: Secondary | ICD-10-CM

## 2022-05-22 DIAGNOSIS — K5900 Constipation, unspecified: Secondary | ICD-10-CM

## 2022-05-22 MED ORDER — HYDROCORTISONE ACETATE 25 MG RE SUPP
25.0000 mg | Freq: Two times a day (BID) | RECTAL | 0 refills | Status: DC
  Filled 2022-05-22: qty 12, 6d supply, fill #0

## 2022-05-22 MED ORDER — HYDROXYZINE PAMOATE 25 MG PO CAPS
25.0000 mg | ORAL_CAPSULE | Freq: Three times a day (TID) | ORAL | 0 refills | Status: DC | PRN
  Filled 2022-05-22: qty 30, 10d supply, fill #0

## 2022-05-22 MED ORDER — DOCUSATE SODIUM 100 MG PO CAPS
100.0000 mg | ORAL_CAPSULE | Freq: Two times a day (BID) | ORAL | 0 refills | Status: DC
  Filled 2022-05-22: qty 10, 5d supply, fill #0

## 2022-05-22 MED ORDER — METHOCARBAMOL 500 MG PO TABS
500.0000 mg | ORAL_TABLET | Freq: Three times a day (TID) | ORAL | 0 refills | Status: DC | PRN
  Filled 2022-05-22: qty 30, 10d supply, fill #0

## 2022-05-22 NOTE — Patient Instructions (Addendum)
1. Constipation, unspecified constipation type  - docusate sodium (COLACE) 100 MG capsule; Take 1 capsule (100 mg total) by mouth 2 (two) times daily.  Dispense: 10 capsule; Refill: 0 - hydrocortisone (ANUSOL-HC) 25 MG suppository; Place 1 suppository (25 mg total) rectally 2 (two) times daily.  Dispense: 12 suppository; Refill: 0  2. Hemorrhoids, unspecified hemorrhoid type  - docusate sodium (COLACE) 100 MG capsule; Take 1 capsule (100 mg total) by mouth 2 (two) times daily.  Dispense: 10 capsule; Refill: 0 - hydrocortisone (ANUSOL-HC) 25 MG suppository; Place 1 suppository (25 mg total) rectally 2 (two) times daily.  Dispense: 12 suppository; Refill: 0  3. Blood in stool  - docusate sodium (COLACE) 100 MG capsule; Take 1 capsule (100 mg total) by mouth 2 (two) times daily.  Dispense: 10 capsule; Refill: 0 - hydrocortisone (ANUSOL-HC) 25 MG suppository; Place 1 suppository (25 mg total) rectally 2 (two) times daily.  Dispense: 12 suppository; Refill: 0 - Ambulatory referral to Gastroenterology   Follow up:  Follow up as scheduled

## 2022-05-22 NOTE — Progress Notes (Unsigned)
$@PatientY$  ID: Wanda Weaver, female    DOB: 07-Feb-1961, 62 y.o.   MRN: [id number]  No chief complaint on file.   Referring provider: [name], NP   HPI         No Known Allergies  Immunization History  Administered Date(s) Administered   Pneumococcal Polysaccharide-23 08/06/2017    Past Medical History:  Diagnosis Date   Bilateral lower extremity edema 07/2019   Bilateral lower extremity pain 07/2019   Diabetes mellitus without complication (HCC)    Hemoglobin A1C greater than 9%, indicating poor diabetic control    Hyperlipidemia    Hypertension    Vitamin D deficiency     Tobacco History: Social History   Tobacco Use  Smoking Status Never  Smokeless Tobacco Never   Counseling given: Not Answered   Outpatient Encounter Medications as of 05/22/2022  Medication Sig   Accu-Chek Softclix Lancets lancets Use as instructed   acetaminophen (TYLENOL) 500 MG tablet Take 500 mg by mouth every 6 (six) hours as needed.   albuterol (VENTOLIN HFA) 108 (90 Base) MCG/ACT inhaler Inhale 2 puffs into the lungs every 6 (six) hours as needed.   blood glucose meter kit and supplies KIT Use up to four times daily as directed.   cyclobenzaprine (FLEXERIL) 10 MG tablet Take 1 tablet (10 mg total) by mouth 2 (two) times daily as needed for muscle spasms.   Elastic Bandages & Supports (KNEE BRACE ADJUSTABLE HINGED) MISC 1 application by Does not apply route daily.   empagliflozin (JARDIANCE) 10 MG TABS tablet Take 1 tablet (10 mg total) by mouth daily before breakfast.   glucose blood (ACCU-CHEK GUIDE) test strip Use as instructed   hydrochlorothiazide (HYDRODIURIL) 25 MG tablet Take 1 tablet (25 mg total) by mouth daily.   ibuprofen (ADVIL) 600 MG tablet Take 1 tablet (600 mg total) by mouth every 8 (eight) hours as needed.   insulin glargine (LANTUS) 100 UNIT/ML injection Inject 0.34 mLs (34 Units total) into the skin daily.   losartan (COZAAR) 25 MG tablet Take 1 tablet (25 mg  total) by mouth daily.   metFORMIN (GLUCOPHAGE-XR) 500 MG 24 hr tablet Take 2 tablets (1,000 mg total) by mouth 2 (two) times daily with a meal.   methocarbamol (ROBAXIN) 500 MG tablet Take 1 tablet (500 mg total) by mouth every 8 (eight) hours as needed for muscle spasms.   metoprolol succinate (TOPROL-XL) 50 MG 24 hr tablet Take 1 tablet (50 mg total) by mouth daily. Take with or immediately following a meal.   omeprazole (PRILOSEC) 20 MG capsule Take 1 capsule (20 mg total) by mouth daily.   ondansetron (ZOFRAN) 4 MG tablet Take 1 tablet (4 mg total) by mouth every 8 (eight) hours as needed for nausea or vomiting.   rosuvastatin (CRESTOR) 40 MG tablet Take 1 tablet (40 mg total) by mouth daily.   No facility-administered encounter medications on file as of 05/22/2022.     Review of Systems  Review of Systems     Physical Exam  There were no vitals taken for this visit.  Wt Readings from Last 5 Encounters:  05/08/22 202 lb 6.4 oz (91.8 kg)  04/22/22 201 lb 6.4 oz (91.4 kg)  02/05/22 203 lb 6.4 oz (92.3 kg)  11/14/21 200 lb (90.7 kg)  11/05/21 206 lb (93.4 kg)     Physical Exam   Lab Results:  CBC    Component Value Date/Time   WBC 4.3 05/08/2022 1021   WBC 6.0  11/14/2021 1041   RBC 4.01 05/08/2022 1021   RBC 4.00 11/14/2021 1041   HGB 10.6 (L) 05/08/2022 1021   HCT 34.0 05/08/2022 1021   PLT 253 05/08/2022 1021   MCV 85 05/08/2022 1021   MCH 26.4 (L) 05/08/2022 1021   MCH 27.5 11/14/2021 1041   MCHC 31.2 (L) 05/08/2022 1021   MCHC 32.3 11/14/2021 1041   RDW 13.1 05/08/2022 1021   LYMPHSABS 1.7 07/17/2020 1108   EOSABS 0.6 (H) 07/17/2020 1108   BASOSABS 0.1 07/17/2020 1108    BMET    Component Value Date/Time   NA 140 05/08/2022 1021   K 4.4 05/08/2022 1021   CL 103 05/08/2022 1021   CO2 24 05/08/2022 1021   GLUCOSE 107 (H) 05/08/2022 1021   GLUCOSE 112 (H) 11/14/2021 1041   BUN 13 05/08/2022 1021   CREATININE 0.96 05/08/2022 1021   CALCIUM 9.7  05/08/2022 1021   GFRNONAA >60 11/14/2021 1041   GFRAA 98 06/30/2019 1544    BNP No results found for: "BNP"  ProBNP No results found for: "PROBNP"  Imaging: DG Shoulder Right  Result Date: 05/02/2022 CLINICAL DATA:  Right shoulder pain EXAM: RIGHT SHOULDER - 2+ VIEW COMPARISON:  Right shoulder x-ray 08/31/2021 FINDINGS: There is no fracture or dislocation. No focal osseous lesion. Glenohumeral joint space is well maintained. There are moderate degenerative changes of the acromioclavicular joint, similar to prior study. Soft tissues are within normal limits. IMPRESSION: Moderate degenerative changes of the acromioclavicular joint, similar to prior study. Electronically Signed   By: Ronney Asters M.D.   On: 05/02/2022 23:43     Assessment & Plan:   No problem-specific Assessment & Plan notes found for this encounter.     [name], NP 05/22/2022

## 2022-05-23 ENCOUNTER — Encounter: Payer: Self-pay | Admitting: Orthopedic Surgery

## 2022-05-23 NOTE — Progress Notes (Unsigned)
Office Visit Note   Patient: Wanda Weaver           Date of Birth: December 16, 1960           MRN: [id number] Visit Date: 05/22/2022 Requested by: [name], NP [phone] [address] [place],  [place] [address] PCP: [name], NP  Subjective: Chief Complaint  Patient presents with   Right Shoulder - Pain    HPI: Wanda Weaver is a 62 y.o. female who presents to the office reporting right shoulder pain.  Pain has been going on for 6 weeks.  She states that her shoulder locked up 1 day when she was at work.  The pain is not improving.  She does have good and bad days.  The pain wakes her from sleep at night.  Hard for her to lay on the right-hand side.  Hard for her to lift any overhead weight.  She does report some radicular pain down to the wrist on occasion.  No definite numbness and tingling though and no neck pain.  She works at [employer] which is a very physical job.  She has been placed on light duty but that is not really being observed per patient report.  Takes ibuprofen with relief.  She does have diabetes..                ROS: All systems reviewed are negative as they relate to the chief complaint within the history of present illness.  Patient denies fevers or chills.  Assessment & Plan: Visit Diagnoses:  1. Right shoulder pain, unspecified chronicity     Plan: Impression is right shoulder pain with likely rotator cuff and/or biceps tendon problem.  No definite arthritis on plain radiographs today.  Due to duration of symptoms as well as failure of conservative therapy and night pain MRI arthrogram of the right shoulder is indicated to evaluate the rotator cuff tear and biceps tendon.  Also out of work x 7 days then return to work with 10 pound lifting limit for 4 weeks.  Patient does have claustrophobia.  Valium prescription sent.  Cortisone injections to increase her blood glucose levels and thus we want to hold off on those for now pending MRI results.  Follow-Up  Instructions: No follow-ups on file.   Orders:  Orders Placed This Encounter  Procedures   MR SHOULDER RIGHT W CONTRAST   Arthrogram   Meds ordered this encounter  Medications   methocarbamol (ROBAXIN) 500 MG tablet    Sig: Take 1 tablet (500 mg total) by mouth every 8 (eight) hours as needed for muscle spasms.    Dispense:  30 tablet    Refill:  0      Procedures: No procedures performed   Clinical Data: No additional findings.  Objective: Vital Signs: There were no vitals taken for this visit.  Physical Exam:  Constitutional: Patient appears well-developed HEENT:  Head: Normocephalic Eyes:EOM are normal Neck: Normal range of motion Cardiovascular: Normal rate Pulmonary/chest: Effort normal Neurologic: Patient is alert Skin: Skin is warm Psychiatric: Patient has normal mood and affect  Ortho Exam: Ortho exam demonstrates passive range of motion in both shoulders of 50/95/165.  She has slight weakness on the right to external rotation of 5- out of 5 compared to 5+ out of 5 on the left.  No Popeye deformity present but O'Brien's testing is positive on the right negative on the left.  No discrete AC joint tenderness on the right-hand side.  Negative apprehension relocation testing on the shoulder.  Cervical spine range of motion is full.  Specialty Comments:  No specialty comments available.  Imaging: No results found.   PMFS History: Patient Active Problem List   Diagnosis Date Noted   Osteoarthritis of right shoulder 04/22/2022   Bilateral lower extremity pain 08/20/2019   Bilateral lower extremity edema 08/20/2019   Hemoglobin A1C greater than 9%, indicating poor diabetic control 02/21/2019   Weight loss 02/21/2019   Muscle strain of wrist, left, initial encounter 09/22/2018   Hyperglycemia 08/15/2018   Essential hypertension 04/25/2018   Class 1 obesity due to excess calories with serious comorbidity in adult 04/25/2018   Right foot pain 04/25/2018    Anxiety 04/25/2018   Type 2 diabetes mellitus with complication, with long-term current use of insulin ([place]) 08/06/2017   Past Medical History:  Diagnosis Date   Bilateral lower extremity edema 07/2019   Bilateral lower extremity pain 07/2019   Diabetes mellitus without complication (HCC)    Hemoglobin A1C greater than 9%, indicating poor diabetic control    Hyperlipidemia    Hypertension    Vitamin D deficiency     Family History  Problem Relation Age of Onset   Diabetes Mother    Dementia Mother    Diabetes Father    Hypertension Father     Past Surgical History:  Procedure Laterality Date   APPENDECTOMY     CARPAL TUNNEL RELEASE Bilateral    CHOLECYSTECTOMY     WRIST SURGERY Bilateral    Social History   Occupational History   Not on file  Tobacco Use   Smoking status: Never   Smokeless tobacco: Never  Vaping Use   Vaping Use: Never used  Substance and Sexual Activity   Alcohol use: No   Drug use: No   Sexual activity: Yes    Partners: Male

## 2022-05-24 MED ORDER — DIAZEPAM 5 MG PO TABS
5.0000 mg | ORAL_TABLET | Freq: Two times a day (BID) | ORAL | 0 refills | Status: DC | PRN
  Filled 2022-05-24 – 2022-06-10 (×2): qty 6, 3d supply, fill #0

## 2022-05-25 ENCOUNTER — Encounter: Payer: Self-pay | Admitting: Nurse Practitioner

## 2022-05-25 ENCOUNTER — Other Ambulatory Visit: Payer: Self-pay

## 2022-05-25 DIAGNOSIS — K5900 Constipation, unspecified: Secondary | ICD-10-CM | POA: Insufficient documentation

## 2022-05-25 NOTE — Assessment & Plan Note (Signed)
-   docusate sodium (COLACE) 100 MG capsule; Take 1 capsule (100 mg total) by mouth 2 (two) times daily.  Dispense: 10 capsule; Refill: 0 - hydrocortisone (ANUSOL-HC) 25 MG suppository; Place 1 suppository (25 mg total) rectally 2 (two) times daily.  Dispense: 12 suppository; Refill: 0  2. Hemorrhoids, unspecified hemorrhoid type  - docusate sodium (COLACE) 100 MG capsule; Take 1 capsule (100 mg total) by mouth 2 (two) times daily.  Dispense: 10 capsule; Refill: 0 - hydrocortisone (ANUSOL-HC) 25 MG suppository; Place 1 suppository (25 mg total) rectally 2 (two) times daily.  Dispense: 12 suppository; Refill: 0  3. Blood in stool  - docusate sodium (COLACE) 100 MG capsule; Take 1 capsule (100 mg total) by mouth 2 (two) times daily.  Dispense: 10 capsule; Refill: 0 - hydrocortisone (ANUSOL-HC) 25 MG suppository; Place 1 suppository (25 mg total) rectally 2 (two) times daily.  Dispense: 12 suppository; Refill: 0 - Ambulatory referral to Gastroenterology   Follow up:  Follow up as scheduled

## 2022-05-27 ENCOUNTER — Telehealth: Payer: Self-pay

## 2022-05-27 NOTE — Telephone Encounter (Signed)
..   Medicaid Managed Care   Unsuccessful Outreach Note  05/27/2022 Name: Wanda Weaver MRN: [id number] DOB: 04-Dec-1960  Referred by: [name], NP Reason for referral : Appointment (I called the patient today to get her scheduled with the MM LCSW and the BSW.)   An unsuccessful telephone outreach was attempted today. The patient was referred to the case management team for assistance with care management and care coordination.   Follow Up Plan: A HIPAA compliant phone message was left for the patient providing contact information and requesting a return call.  The care management team will reach out to the patient again over the next 7 days.   [place]

## 2022-05-28 ENCOUNTER — Other Ambulatory Visit: Payer: Self-pay

## 2022-06-01 ENCOUNTER — Other Ambulatory Visit: Payer: Self-pay

## 2022-06-08 ENCOUNTER — Telehealth: Payer: Self-pay

## 2022-06-08 NOTE — Telephone Encounter (Signed)
..   Medicaid Managed Care   Unsuccessful Outreach Note  06/08/2022 Name: Wanda Weaver MRN: [id number] DOB: 1960/09/29  Referred by: [name], NP Reason for referral : Appointment   A second unsuccessful telephone outreach was attempted today. The patient was referred to the case management team for assistance with care management and care coordination.   Follow Up Plan: A HIPAA compliant phone message was left for the patient providing contact information and requesting a return call.  The care management team will reach out to the patient again over the next 7 days.   [place]

## 2022-06-10 ENCOUNTER — Other Ambulatory Visit: Payer: Self-pay

## 2022-06-12 ENCOUNTER — Other Ambulatory Visit: Payer: Self-pay

## 2022-06-17 ENCOUNTER — Telehealth: Payer: Self-pay

## 2022-06-17 NOTE — Telephone Encounter (Signed)
..   Medicaid Managed Care   Unsuccessful Outreach Note  06/17/2022 Name: Wanda Weaver MRN: [id number] DOB: [birth year]  Referred by: [name], NP Reason for referral : Appointment   Third unsuccessful telephone outreach was attempted today. The patient was referred to the case management team for assistance with care management and care coordination. The patient's primary care provider has been notified of our unsuccessful attempts to make or maintain contact with the patient. The care management team is pleased to engage with this patient at any time in the future should he/she be interested in assistance from the care management team.   Follow Up Plan: We have been unable to make contact with the patient for follow up. The care management team is available to follow up with the patient after provider conversation with the patient regarding recommendation for care management engagement and subsequent re-referral to the care management team.   [place], [place]

## 2022-06-18 ENCOUNTER — Other Ambulatory Visit: Payer: Medicaid Other | Admitting: Pharmacist

## 2022-06-18 MED ORDER — FREESTYLE LIBRE 3 SENSOR MISC
3 refills | Status: DC
  Filled 2022-06-18: qty 6, 84d supply, fill #0
  Filled 2022-06-19: qty 2, 28d supply, fill #0

## 2022-06-18 MED ORDER — FREESTYLE LIBRE 3 READER DEVI
1.0000 | Freq: Every day | 0 refills | Status: DC
  Filled 2022-06-18: qty 1, 1d supply, fill #0
  Filled 2022-06-19: qty 1, 34d supply, fill #0

## 2022-06-18 NOTE — Progress Notes (Signed)
06/18/2022 Name: Wanda Weaver MRN: [id number] DOB: [birth year]  Chief Complaint  Patient presents with   Medication Management   Diabetes   Hypertension    Wanda Weaver is a 62 y.o. year old female who presented for a telephone visit.   They were referred to the pharmacist by their PCP for assistance in managing diabetes.   Patient is participating in a Managed Medicaid Plan:  Yes  Subjective:  Care Team: Primary Care Provider: [name], NP ; Next Scheduled Visit: 08/07/23   Medication Access/Adherence  Current Pharmacy:  [facility], [place] [address] Phone: [phone] Fax: [phone]   Patient reports affordability concerns with their medications: No  Patient reports access/transportation concerns to their pharmacy: No  Patient reports adherence concerns with their medications:  No     Diabetes:  Current medications: metformin XR 1000 mg twice daily, Lantus 34 units daily (patient prefers vials); Jardiance 10 mg daily  Reports she has had continuous burning/itching with starting Jardiance. Has used OTC monistat to treat.  Discussed recent changes to Medicaid coverage for Libre    Objective:  Lab Results  Component Value Date   HGBA1C 7.9 (A) 05/08/2022   HGBA1C 7.9 05/08/2022   HGBA1C 7.9 (A) 05/08/2022   HGBA1C 7.9 (A) 05/08/2022    Lab Results  Component Value Date   CREATININE 0.96 05/08/2022   BUN 13 05/08/2022   NA 140 05/08/2022   K 4.4 05/08/2022   CL 103 05/08/2022   CO2 24 05/08/2022    Lab Results  Component Value Date   CHOL 221 (H) 05/08/2022   HDL 47 05/08/2022   LDLCALC 150 (H) 05/08/2022   TRIG 131 05/08/2022   CHOLHDL 4.7 (H) 05/08/2022    Medications Reviewed Today     Reviewed by [name], NP (Nurse Practitioner) on 05/25/22 at 1332  Med List Status: <None>   Medication Order Taking? Sig Documenting Provider Last Dose Status  Informant  Accu-Chek Softclix Lancets lancets [id number] No Use as instructed [name], NP Taking Active   acetaminophen (TYLENOL) 500 MG tablet [id number] No Take 500 mg by mouth every 6 (six) hours as needed. [provider] Taking Active            Med Note Elyse Jarvis   Wed Apr 22, 2022  8:54 AM) Prn   albuterol (VENTOLIN HFA) 108 (90 Base) MCG/ACT inhaler [id number] No Inhale 2 puffs into the lungs every 6 (six) hours as needed. [name], NP Taking Active   blood glucose meter kit and supplies KIT [id number] No Use up to four times daily as directed. [name], NP Taking Active   cyclobenzaprine (FLEXERIL) 10 MG tablet [id number]  Take 1 tablet (10 mg total) by mouth 2 (two) times daily as needed for muscle spasms. [name], NP  Active   diazepam (VALIUM) 5 MG tablet [id number]  Take 1 tablet (5 mg total) by mouth every 12 (twelve) hours as needed for anxiety. Meredith Pel, MD  Active   docusate sodium (COLACE) 100 MG capsule [id number] Yes Take 1 capsule (100 mg total) by mouth 2 (two) times daily. [name], NP  Active   Elastic Bandages & Supports (KNEE BRACE ADJUSTABLE HINGED) MISC [id number] No 1 application by Does not apply route daily. [name], NP Taking Active   empagliflozin (JARDIANCE) 10 MG TABS tablet [id number]  Take 1 tablet (10  mg total) by mouth daily before breakfast. [name], NP  Active   glucose blood (ACCU-CHEK GUIDE) test strip [id number] No Use as instructed [name], NP Taking Active   hydrochlorothiazide (HYDRODIURIL) 25 MG tablet [id number] No Take 1 tablet (25 mg total) by mouth daily. [name], NP Taking Active   hydrocortisone (ANUSOL-HC) 25 MG suppository [id number] Yes Place 1 suppository (25 mg total) rectally 2 (two) times daily. [name], NP  Active   hydrOXYzine (VISTARIL) 25 MG capsule [id number] Yes Take 1 capsule (25 mg total) by mouth every 8 (eight) hours as needed.  [name], NP  Active   ibuprofen (ADVIL) 600 MG tablet [id number] No Take 1 tablet (600 mg total) by mouth every 8 (eight) hours as needed. Renee Rival, FNP Taking Active   insulin glargine (LANTUS) 100 UNIT/ML injection [id number]  Inject 0.34 mLs (34 Units total) into the skin daily. [name], NP  Active   losartan (COZAAR) 25 MG tablet [id number] No Take 1 tablet (25 mg total) by mouth daily. Renee Rival, FNP Taking Active   metFORMIN (GLUCOPHAGE-XR) 500 MG 24 hr tablet [id number] No Take 2 tablets (1,000 mg total) by mouth 2 (two) times daily with a meal. [name], NP Taking Active   methocarbamol (ROBAXIN) 500 MG tablet [id number]  Take 1 tablet (500 mg total) by mouth every 8 (eight) hours as needed for muscle spasms. Meredith Pel, MD  Active   metoprolol succinate (TOPROL-XL) 50 MG 24 hr tablet [id number] No Take 1 tablet (50 mg total) by mouth daily. Take with or immediately following a meal. [name], NP Taking Active   omeprazole (PRILOSEC) 20 MG capsule [id number] No Take 1 capsule (20 mg total) by mouth daily. [name], NP Taking Active   ondansetron (ZOFRAN) 4 MG tablet [id number] No Take 1 tablet (4 mg total) by mouth every 8 (eight) hours as needed for nausea or vomiting. Renee Rival, FNP Taking Active            Med Note Elyse Jarvis   Wed Apr 22, 2022  8:57 AM) prn  rosuvastatin (CRESTOR) 40 MG tablet [id number]  Take 1 tablet (40 mg total) by mouth daily. [name], NP  Active               Assessment/Plan:   Diabetes: - Currently uncontrolled - Given reports of intolerance due to genitourinary side effects, recommend to continue Jardiance as patient requests. Continue metformin and Lantus.  - Recommend to pursue coverage of CGM. Patient interested. PA placed for [facility] 3 reader and sensor as she is not sure if her phone can download apps.    Follow Up Plan: phone call in 4 weeks  Catie TJodi Mourning,  PharmD, [place], [facility] [phone]

## 2022-06-19 ENCOUNTER — Other Ambulatory Visit: Payer: Self-pay | Admitting: Pharmacist

## 2022-06-19 ENCOUNTER — Other Ambulatory Visit: Payer: Medicaid Other

## 2022-06-19 ENCOUNTER — Ambulatory Visit: Admission: RE | Admit: 2022-06-19 | Payer: Medicaid Other | Source: Ambulatory Visit

## 2022-06-19 ENCOUNTER — Other Ambulatory Visit: Payer: Self-pay

## 2022-06-19 MED ORDER — FREESTYLE LIBRE 2 READER DEVI
0 refills | Status: DC
  Filled 2022-06-19: qty 1, 34d supply, fill #0

## 2022-06-19 MED ORDER — FREESTYLE LIBRE 2 SENSOR MISC
3 refills | Status: DC
  Filled 2022-06-19: qty 2, 28d supply, fill #0

## 2022-06-19 NOTE — Progress Notes (Signed)
Per pharmacy, her primary plan requires a DME supplier. Order for [facility] reader and sensors sent to [place] via [employer].

## 2022-06-19 NOTE — Progress Notes (Signed)
Care Coordination Call  [place] 3 reader still not covered by [facility] Medicaid. Script sent for [place] 2 reader.   Catie Hedwig Morton, PharmD, [place], [facility] [phone]

## 2022-06-22 ENCOUNTER — Other Ambulatory Visit: Payer: Self-pay

## 2022-06-26 ENCOUNTER — Ambulatory Visit: Payer: Medicaid Other | Admitting: Orthopedic Surgery

## 2022-07-06 ENCOUNTER — Emergency Department (HOSPITAL_COMMUNITY): Payer: Medicaid Other

## 2022-07-06 ENCOUNTER — Other Ambulatory Visit: Payer: Self-pay

## 2022-07-06 ENCOUNTER — Encounter (HOSPITAL_COMMUNITY): Payer: Self-pay

## 2022-07-06 ENCOUNTER — Emergency Department (HOSPITAL_COMMUNITY)
Admission: EM | Admit: 2022-07-06 | Discharge: 2022-07-06 | Disposition: A | Discharge status: Home / Self Care | Payer: Medicaid Other | Attending: Emergency Medicine | Admitting: Emergency Medicine

## 2022-07-06 DIAGNOSIS — Z794 Long term (current) use of insulin: Secondary | ICD-10-CM | POA: Diagnosis not present

## 2022-07-06 DIAGNOSIS — I10 Essential (primary) hypertension: Secondary | ICD-10-CM | POA: Diagnosis not present

## 2022-07-06 DIAGNOSIS — Z7984 Long term (current) use of oral hypoglycemic drugs: Secondary | ICD-10-CM | POA: Insufficient documentation

## 2022-07-06 DIAGNOSIS — Y990 Civilian activity done for income or pay: Secondary | ICD-10-CM | POA: Diagnosis not present

## 2022-07-06 DIAGNOSIS — S46912A Strain of unspecified muscle, fascia and tendon at shoulder and upper arm level, left arm, initial encounter: Secondary | ICD-10-CM | POA: Diagnosis not present

## 2022-07-06 DIAGNOSIS — X58XXXA Exposure to other specified factors, initial encounter: Secondary | ICD-10-CM | POA: Diagnosis not present

## 2022-07-06 DIAGNOSIS — E119 Type 2 diabetes mellitus without complications: Secondary | ICD-10-CM | POA: Insufficient documentation

## 2022-07-06 DIAGNOSIS — Z79899 Other long term (current) drug therapy: Secondary | ICD-10-CM | POA: Insufficient documentation

## 2022-07-06 DIAGNOSIS — Y92511 Restaurant or cafe as the place of occurrence of the external cause: Secondary | ICD-10-CM | POA: Diagnosis not present

## 2022-07-06 DIAGNOSIS — S4992XA Unspecified injury of left shoulder and upper arm, initial encounter: Secondary | ICD-10-CM | POA: Diagnosis present

## 2022-07-06 MED ORDER — IBUPROFEN 800 MG PO TABS
800.0000 mg | ORAL_TABLET | Freq: Once | ORAL | Status: AC
  Administered 2022-07-06: 800 mg via ORAL
  Filled 2022-07-06: qty 1

## 2022-07-06 MED ORDER — IBUPROFEN 600 MG PO TABS
600.0000 mg | ORAL_TABLET | Freq: Four times a day (QID) | ORAL | 0 refills | Status: DC | PRN
  Filled 2022-07-06: qty 30, 8d supply, fill #0

## 2022-07-06 MED ORDER — CYCLOBENZAPRINE HCL 10 MG PO TABS
10.0000 mg | ORAL_TABLET | Freq: Two times a day (BID) | ORAL | 0 refills | Status: DC | PRN
  Filled 2022-07-06: qty 20, 10d supply, fill #0

## 2022-07-06 NOTE — Discharge Instructions (Signed)
You have been evaluated for your symptoms.  Your pain is likely left shoulder strain or possibly even a rotator cuff injury.  Wear sling as needed for support but do not wear for more than 3 to 4 hours at a time otherwise you can develop frozen shoulder.  You may take muscle relaxant and anti-inflammatory medication in meantime but follow-up closely with orthopedist for further care.  Return if you have any concern.

## 2022-07-06 NOTE — ED Triage Notes (Signed)
Left shoulder pain x2 days  Pain worse when trying to lift above head Icy hot w/o relief.  Pt has been told in past its arthritis pain.

## 2022-07-06 NOTE — ED Provider Notes (Signed)
[place] EMERGENCY DEPARTMENT AT [facility] Provider Note   CSN: [id number] Arrival date & time: 07/06/22  [id number]     History  Chief Complaint  Patient presents with   Shoulder Pain    Wanda Weaver is a 62 y.o. female.  The history is provided by the patient and medical records. No language interpreter was used.  Shoulder Pain    62 year old female significant history of diabetes, hypertension, obesity, osteoarthritis of the shoulder who presents with complaints of shoulder pain.  Patient has had recurrent pain to her shoulders but within the past 1 to 2 weeks she has worsening shoulder pain.  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  She endorsed worsening pain for the past few days having difficulty sleeping because of it.  Pain mildly improved with over-the-counter medication.  She has similar pain like this in the past involving her left shoulder then it migrated to her right shoulder, and now back to her left shoulder.  She denies any fever or chills no neck pain no chest pain trouble breathing exertional chest pain nausea or diaphoresis.  She tried follow-up with orthopedist Dr. Marlou Sa but was unable to get an appointment thus prompting this ER visit.  She denies any significant cardiac history.  Home Medications Prior to Admission medications   Medication Sig Start Date End Date Taking? Authorizing Provider  Accu-Chek Softclix Lancets lancets Use as instructed 03/25/22   [name], NP  acetaminophen (TYLENOL) 500 MG tablet Take 500 mg by mouth every 6 (six) hours as needed.    [provider]  albuterol (VENTOLIN HFA) 108 (90 Base) MCG/ACT inhaler Inhale 2 puffs into the lungs every 6 (six) hours as needed. 05/09/21   [name], NP  blood glucose meter kit and  supplies KIT Use up to four times daily as directed. 03/25/22   [name], NP  Continuous Blood Gluc Receiver (FREESTYLE LIB[employer] 2 READER) DEVI Use to check glucose continuously 06/19/22   [name], NP  Continuous Blood Gluc Sensor (FREESTYLE LIBRE 2 SENSOR) MISC Apply a new sensor every 14 days. Use to check glucose continuously 06/19/22   [name], NP  cyclobenzaprine (FLEXERIL) 10 MG tablet Take 1 tablet (10 mg total) by mouth 2 (two) times daily as needed for muscle spasms. 05/08/22   [name], NP  diazepam (VALIUM) 5 MG tablet Take 1 tablet (5 mg total) by mouth every 12 (twelve) hours as needed for anxiety. 05/24/22   Meredith Pel, MD  docusate sodium (COLACE) 100 MG capsule Take 1 capsule (100 mg total) by mouth 2 (two) times daily. 05/22/22   [name], NP  Elastic Bandages & Supports (KNEE BRACE ADJUSTABLE HINGED) MISC 1 application by Does not apply route daily. 05/09/21   [name], NP  empagliflozin (JARDIANCE) 10 MG TABS tablet Take 1 tablet (10 mg total) by mouth daily before breakfast. 05/21/22   [name], NP  glucose blood (ACCU-CHEK GUIDE) test strip Use as instructed 03/25/22   [name], NP  hydrochlorothiazide (HYDRODIURIL) 25 MG tablet Take 1 tablet (25 mg total) by mouth daily. 05/08/22   [name], NP  hydrocortisone (ANUSOL-HC) 25 MG suppository Place 1 suppository (25 mg total) rectally 2 (two) times daily. 05/22/22  [name], NP  hydrOXYzine (VISTARIL) 25 MG capsule Take 1 capsule (25 mg total) by mouth every 8 (eight) hours as needed. 05/22/22   [name], NP  ibuprofen (ADVIL) 600 MG tablet Take 1 tablet (600 mg total) by mouth every 8 (eight) hours as needed. 04/22/22   [name], Dewaine Conger, FNP  insulin glargine (LANTUS) 100 UNIT/ML injection Inject 0.34 mLs (34 Units total) into the skin daily. 05/21/22   [name], NP  losartan (COZAAR) 25 MG tablet Take 1 tablet (25 mg total) by mouth daily.  04/22/22   Renee Rival, FNP  metFORMIN (GLUCOPHAGE-XR) 500 MG 24 hr tablet Take 2 tablets (1,000 mg total) by mouth 2 (two) times daily with a meal. 03/25/22   [name], NP  methocarbamol (ROBAXIN) 500 MG tablet Take 1 tablet (500 mg total) by mouth every 8 (eight) hours as needed for muscle spasms. 05/22/22   Meredith Pel, MD  metoprolol succinate (TOPROL-XL) 50 MG 24 hr tablet Take 1 tablet (50 mg total) by mouth daily. Take with or immediately following a meal. 05/08/22   [name], NP  omeprazole (PRILOSEC) 20 MG capsule Take 1 capsule (20 mg total) by mouth daily. 02/05/22   [name], NP  ondansetron (ZOFRAN) 4 MG tablet Take 1 tablet (4 mg total) by mouth every 8 (eight) hours as needed for nausea or vomiting. 04/03/22   [name], Dewaine Conger, FNP  rosuvastatin (CRESTOR) 40 MG tablet Take 1 tablet (40 mg total) by mouth daily. 05/21/22   [name], NP      Allergies    Patient has no known allergies.    Review of Systems   Review of Systems  All other systems reviewed and are negative.   Physical Exam Updated Vital Signs BP (!) 160/76 (BP Location: Right Arm)   Pulse 81   Temp 98.3 F (36.8 C) (Oral)   Resp 18   Wt 91 kg   SpO2 99%   BMI 38.86 kg/m  Physical Exam Vitals and nursing note reviewed.  Constitutional:      General: She is not in acute distress.    Appearance: She is well-developed.  HENT:     Head: Atraumatic.  Eyes:     Conjunctiva/sclera: Conjunctivae normal.  Cardiovascular:     Rate and Rhythm: Normal rate and regular rhythm.     Pulses: Normal pulses.     Heart sounds: Normal heart sounds.  Pulmonary:     Effort: Pulmonary effort is normal.  Abdominal:     Palpations: Abdomen is soft.  Musculoskeletal:        General: Tenderness (Left shoulder: Tenderness to lateral deltoid and along the insertion of the bicipital groove.  Decreased shoulder range of motion secondary to pain but no swelling erythema or edema  appreciated) present.     Cervical back: Normal range of motion and neck supple.  Skin:    Findings: No rash.  Neurological:     Mental Status: She is alert.  Psychiatric:        Mood and Affect: Mood normal.     ED Results / Procedures / Treatments   Labs (all labs ordered are listed, but only abnormal results are displayed) Labs Reviewed - No data to display  EKG None  Radiology DG Shoulder Left  Result Date: 07/06/2022 CLINICAL DATA:  Shoulder pain for 2 days worsened when trying to lift above head. EXAM: LEFT SHOULDER - 2+ VIEW COMPARISON:  Shoulder radiographs 05/02/2017 FINDINGS: There is no acute fracture or dislocation. Glenohumeral and acromioclavicular alignment is normal. There is mild degenerative change at the [facility] joint, similar to 2019. The glenohumeral joint space appears preserved. The soft tissues are unremarkable. IMPRESSION: Mild degenerative change of the [facility] joint, similar to 2019. No acute finding. Electronically Signed   By: Valetta Mole M.D.   On: 07/06/2022 11:29    Procedures Procedures    Medications Ordered in ED Medications  ibuprofen (ADVIL) tablet 800 mg (has no administration in time range)    ED Course/ Medical Decision Making/ A&P                             Medical Decision Making Amount and/or Complexity of Data Reviewed Radiology: ordered.  Risk Prescription drug management.   BP (!) 160/76 (BP Location: Right Arm)   Pulse 81   Temp 98.3 F (36.8 C) (Oral)   Resp 18   Wt 91 kg   SpO2 99%   BMI 38.86 kg/m   55:70 PM 62 year old female significant history of diabetes, hypertension, obesity, osteoarthritis of the shoulder who presents with complaints of shoulder pain.  Patient has had recurrent pain to her shoulders but within the past 1 to 2 weeks she has worsening shoulder pain.  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  She endorsed worsening pain for the past few days having difficulty sleeping because of it.  Pain mildly improved with over-the-counter medication.  She has similar pain like this in the past involving her left shoulder then it migrated to her right shoulder, and now back to her left shoulder.  She denies any fever or chills no neck pain no chest pain trouble breathing exertional chest pain nausea or diaphoresis.  She tried follow-up with orthopedist Dr. Marlou Sa but was unable to get an appointment thus prompting this ER visit.  She denies any significant cardiac history.  On exam, patient is sitting in bed rubbing her left shoulder.  She does have tenderness along her lateral deltoid and at the left bicipital groove without any overlying skin changes no erythema, edema, or warmth.  Shoulder with decreased but intact range of motion and no deformity.  Left elbow left wrist nontender with radial pulse palpable.  X-ray of left shoulder obtained independently viewed interpreted by me and overall reassuring.  She does have some mild degenerative change at the [facility] joint but no acute finding.  At this time, I felt that her pain is likely either a rotator cuff injury, or muscle strain from prolonged hyperextension of her left shoulder while working.  I have low suspicion for infectious etiology.  Low suspicion for cardiac causes.  Consider obtaining labs such as CBC, BMP, troponin but felt that it is low yield.  I plan to provide patient with a sling for support, anti-inflammatory occasion and muscle relaxant as needed, and to have patient follow-up with orthopedist for outpatient management of her condition.  I will also provide work note.  Ibuprofen and sling provided for improvement of symptoms.        Final Clinical Impression(s) / ED Diagnoses Final diagnoses:  Strain of left shoulder, initial encounter    Rx / DC  Orders ED Discharge Orders  Ordered    cyclobenzaprine (FLEXERIL) 10 MG tablet  2 times daily PRN        07/06/22 1352    ibuprofen (ADVIL) 600 MG tablet  Every 6 hours PRN        07/06/22 1352              [name], PA-C 07/06/22 1353    Cristie Hem, MD 07/07/22 1404

## 2022-07-07 ENCOUNTER — Telehealth: Payer: Self-pay

## 2022-07-07 NOTE — Transitions of Care (Post Inpatient/ED Visit) (Cosign Needed)
   07/07/2022  Name: Wanda Weaver MRN: [id number] DOB: 25-Jan-1961  Today's TOC FU Call Status: Today's TOC FU Call Status:: Unsuccessul Call (1st Attempt) Unsuccessful Call (1st Attempt) Date: 07/07/22  Attempted to reach the patient regarding the most recent Inpatient/ED visit.  Follow Up Plan: Additional outreach attempts will be made to reach the patient to complete the Transitions of Care (Post Inpatient/ED visit) call.   [place] RMA

## 2022-07-08 ENCOUNTER — Telehealth: Payer: Self-pay

## 2022-07-08 NOTE — Transitions of Care (Post Inpatient/ED Visit) (Signed)
   07/08/2022  Name: Wanda Weaver MRN: [id number] DOB: [birth year]  Today's TOC FU Call Status: Today's TOC FU Call Status:: Unsuccessful Call (2nd Attempt) Unsuccessful Call (2nd Attempt) Date: 07/08/22  Attempted to reach the patient regarding the most recent Inpatient/ED visit.  Follow Up Plan: Additional outreach attempts will be made to reach the patient to complete the Transitions of Care (Post Inpatient/ED visit) call.   [place] RMA

## 2022-07-09 ENCOUNTER — Telehealth: Payer: Self-pay

## 2022-07-09 NOTE — Transitions of Care (Post Inpatient/ED Visit) (Signed)
   07/09/2022  Name: Wanda Weaver MRN: [id number] DOB: 07-06-1960  Today's TOC FU Call Status: Today's TOC FU Call Status:: Unsuccessful Call (3rd Attempt) Unsuccessful Call (3rd Attempt) Date: 07/09/22  Attempted to reach the patient regarding the most recent Inpatient/ED visit.  Follow Up Plan: No further outreach attempts will be made at this time. We have been unable to contact the patient.  [place] RMA

## 2022-07-10 ENCOUNTER — Other Ambulatory Visit: Payer: Self-pay | Admitting: Pharmacist

## 2022-07-10 NOTE — Progress Notes (Signed)
Care Coordination Call  Received notification from [employer] that patient's order for FreeStyle Elenor Legato was denied by her commercial [employer], but that there is an option for Peer to Peer.   Contacted provided Peer to Peer number - [phone]. They refuse to complete a peer to peer with a pharmacist, peer to peer can only be done with a MD/DO/PA/NP. Will discuss with patient's PCP next week.   Catie Hedwig Morton, PharmD, [place], [facility] [phone]

## 2022-07-13 ENCOUNTER — Other Ambulatory Visit: Payer: Self-pay

## 2022-07-15 ENCOUNTER — Other Ambulatory Visit: Payer: Self-pay

## 2022-07-16 ENCOUNTER — Encounter: Payer: Self-pay | Admitting: Pharmacist

## 2022-07-20 ENCOUNTER — Other Ambulatory Visit: Payer: Self-pay

## 2022-07-20 ENCOUNTER — Other Ambulatory Visit: Payer: Self-pay | Admitting: Nurse Practitioner

## 2022-07-20 DIAGNOSIS — I10 Essential (primary) hypertension: Secondary | ICD-10-CM

## 2022-07-21 ENCOUNTER — Other Ambulatory Visit: Payer: Self-pay

## 2022-07-21 MED ORDER — LOSARTAN POTASSIUM 25 MG PO TABS
25.0000 mg | ORAL_TABLET | Freq: Every day | ORAL | 0 refills | Status: DC
  Filled 2022-07-21 – 2022-08-28 (×2): qty 90, 90d supply, fill #0

## 2022-07-23 ENCOUNTER — Other Ambulatory Visit: Payer: Self-pay | Admitting: Nurse Practitioner

## 2022-07-23 ENCOUNTER — Other Ambulatory Visit: Payer: Medicaid Other | Admitting: Pharmacist

## 2022-07-24 ENCOUNTER — Other Ambulatory Visit: Payer: Self-pay

## 2022-07-24 ENCOUNTER — Ambulatory Visit (INDEPENDENT_AMBULATORY_CARE_PROVIDER_SITE_OTHER): Payer: Medicaid Other | Admitting: Nurse Practitioner

## 2022-07-24 ENCOUNTER — Encounter: Payer: Self-pay | Admitting: Nurse Practitioner

## 2022-07-24 VITALS — BP 129/58 | HR 77 | Temp 98.2°F | Wt 200.0 lb

## 2022-07-24 DIAGNOSIS — R531 Weakness: Secondary | ICD-10-CM | POA: Diagnosis not present

## 2022-07-24 DIAGNOSIS — R52 Pain, unspecified: Secondary | ICD-10-CM | POA: Diagnosis not present

## 2022-07-24 DIAGNOSIS — M2550 Pain in unspecified joint: Secondary | ICD-10-CM | POA: Diagnosis not present

## 2022-07-24 DIAGNOSIS — M7502 Adhesive capsulitis of left shoulder: Secondary | ICD-10-CM | POA: Diagnosis not present

## 2022-07-24 DIAGNOSIS — R432 Parageusia: Secondary | ICD-10-CM | POA: Diagnosis not present

## 2022-07-24 MED ORDER — KETOROLAC TROMETHAMINE 30 MG/ML IJ SOLN
30.0000 mg | Freq: Once | INTRAMUSCULAR | Status: AC
Start: 2022-07-24 — End: 2022-07-29

## 2022-07-24 MED ORDER — KETOROLAC TROMETHAMINE 30 MG/ML IJ SOLN
30.0000 mg | Freq: Once | INTRAMUSCULAR | Status: AC
Start: 2022-07-24 — End: 2022-07-24
  Administered 2022-07-24: 30 mg via INTRAMUSCULAR

## 2022-07-24 MED ORDER — ALBUTEROL SULFATE HFA 108 (90 BASE) MCG/ACT IN AERS
2.0000 | INHALATION_SPRAY | Freq: Four times a day (QID) | RESPIRATORY_TRACT | 2 refills | Status: DC | PRN
  Filled 2022-07-24: qty 18, 25d supply, fill #0
  Filled 2022-11-18: qty 18, 25d supply, fill #1
  Filled 2023-03-09: qty 18, 25d supply, fill #2

## 2022-07-24 NOTE — Patient Instructions (Addendum)
1. Adhesive capsulitis of left shoulder  - Ambulatory referral to Orthopedics - ketorolac (TORADOL) 30 MG/ML injection 30 mg   2. Multiple joint pain  - ANA - Rheumatoid factor - Sedimentation Rate - C-reactive protein - Ambulatory referral to Neurology   3. Loss of taste  - Ambulatory referral to Neurology   4. Generalized weakness  - Ambulatory referral to Neurology   5. Generalized pain  - Ambulatory referral to Neurology    Follow up:  Follow up as scheduled

## 2022-07-24 NOTE — Telephone Encounter (Signed)
Please advise KH 

## 2022-07-24 NOTE — Progress Notes (Signed)
  ID: Wanda Weaver, female    DOB: [birth year], 62 y.o.   MRN: [id number]  Chief Complaint  Patient presents with   Hospitalization Follow-up    Some improvement    Referring provider: [name], NP   HPI  Wanda Weaver is a 62 y.o. female who presents for follow-up of Type 2 diabetes mellitus. Has a past medical history of Bilateral lower extremity edema (07/2019), Bilateral lower extremity pain (07/2019), Diabetes mellitus without complication (HCC), Hemoglobin A1C greater than 9%, indicating poor diabetic control, Hyperlipidemia, Hypertension, and Vitamin D deficiency.    Patient presents today for a ED follow-up.  She was seen in the ED on 07/06/2022 for left shoulder strain.  She states that she is having a lot of pain in her shoulders due to the work that she does with [name]. Will write her a letter today for light duty.  Will give Toradol shot today.  Will place a new referral to Ortho for follow-up.  She has seen Ortho for her right shoulder.  It is noted that patient does have pain to multiple joints, generalized weakness and fatigue, and also complains of dizziness and loss of taste.  We will refer her to neurology.  Patient is concerned that she may have MS.  We will also do a lab workup to rule out lupus and rheumatoid arthritis.  Patient also states that her hours at work have been cut back and she does not have enough money for groceries.  We will give her some food from the food pantry today we will get her set up with social worker for assistance. Denies f/c/s, n/v/d, hemoptysis, PND, leg swelling Denies chest pain or edema     No Known Allergies  Immunization History  Administered Date(s) Administered   Pneumococcal Polysaccharide-23 08/06/2017    Past Medical History:  Diagnosis Date   Bilateral lower extremity edema 07/2019   Bilateral lower extremity pain 07/2019   Diabetes mellitus without complication    Hemoglobin A1C greater than 9%, indicating  poor diabetic control    Hyperlipidemia    Hypertension    Vitamin D deficiency     Tobacco History: Social History   Tobacco Use  Smoking Status Never  Smokeless Tobacco Never   Counseling given: Not Answered   Outpatient Encounter Medications as of 07/24/2022  Medication Sig   Accu-Chek Softclix Lancets lancets Use as instructed   acetaminophen (TYLENOL) 500 MG tablet Take 500 mg by mouth every 6 (six) hours as needed.   blood glucose meter kit and supplies KIT Use up to four times daily as directed.   cyclobenzaprine (FLEXERIL) 10 MG tablet Take 1 tablet (10 mg total) by mouth 2 (two) times daily as needed for muscle spasms.   hydrochlorothiazide (HYDRODIURIL) 25 MG tablet Take 1 tablet (25 mg total) by mouth daily.   hydrOXYzine (VISTARIL) 25 MG capsule Take 1 capsule (25 mg total) by mouth every 8 (eight) hours as needed.   ibuprofen (ADVIL) 600 MG tablet Take 1 tablet (600 mg total) by mouth every 6 (six) hours as needed.   insulin glargine (LANTUS) 100 UNIT/ML injection Inject 0.34 mLs (34 Units total) into the skin daily.   losartan (COZAAR) 25 MG tablet Take 1 tablet (25 mg total) by mouth daily.   metFORMIN (GLUCOPHAGE-XR) 500 MG 24 hr tablet Take 2 tablets (1,000 mg total) by mouth 2 (two) times daily with a meal.   methocarbamol (ROBAXIN) 500 MG tablet Take 1 tablet (500 mg  total) by mouth every 8 (eight) hours as needed for muscle spasms.   metoprolol succinate (TOPROL-XL) 50 MG 24 hr tablet Take 1 tablet (50 mg total) by mouth daily. Take with or immediately following a meal.   omeprazole (PRILOSEC) 20 MG capsule Take 1 capsule (20 mg total) by mouth daily.   ondansetron (ZOFRAN) 4 MG tablet Take 1 tablet (4 mg total) by mouth every 8 (eight) hours as needed for nausea or vomiting.   rosuvastatin (CRESTOR) 40 MG tablet Take 1 tablet (40 mg total) by mouth daily.   [DISCONTINUED] albuterol (VENTOLIN HFA) 108 (90 Base) MCG/ACT inhaler Inhale 2 puffs into the lungs every 6  (six) hours as needed.   Continuous Blood Gluc Receiver (FREESTYLE LIBRE 2 READER) DEVI Use to check glucose continuously (Patient not taking: Reported on 07/24/2022)   Continuous Blood Gluc Sensor (FREESTYLE LIBRE 2 SENSOR) MISC Apply a new sensor every 14 days. Use to check glucose continuously (Patient not taking: Reported on 07/24/2022)   diazepam (VALIUM) 5 MG tablet Take 1 tablet (5 mg total) by mouth every 12 (twelve) hours as needed for anxiety. (Patient not taking: Reported on 07/24/2022)   docusate sodium (COLACE) 100 MG capsule Take 1 capsule (100 mg total) by mouth 2 (two) times daily. (Patient not taking: Reported on 07/24/2022)   Elastic Bandages & Supports (KNEE BRACE ADJUSTABLE HINGED) MISC 1 application by Does not apply route daily. (Patient not taking: Reported on 07/24/2022)   empagliflozin (JARDIANCE) 10 MG TABS tablet Take 1 tablet (10 mg total) by mouth daily before breakfast. (Patient not taking: Reported on 07/24/2022)   glucose blood (ACCU-CHEK GUIDE) test strip Use as instructed (Patient not taking: Reported on 07/24/2022)   hydrocortisone (ANUSOL-HC) 25 MG suppository Place 1 suppository (25 mg total) rectally 2 (two) times daily. (Patient not taking: Reported on 07/24/2022)   Facility-Administered Encounter Medications as of 07/24/2022  Medication   ketorolac (TORADOL) 30 MG/ML injection 30 mg   ketorolac (TORADOL) 30 MG/ML injection 30 mg     Review of Systems  Review of Systems  Constitutional: Negative.   HENT: Negative.    Cardiovascular: Negative.   Gastrointestinal: Negative.   Musculoskeletal:        Left shoulder pain with decreased ROM  Allergic/Immunologic: Negative.   Neurological: Negative.   Psychiatric/Behavioral: Negative.         Physical Exam  BP (!) 129/58   Pulse 77   Temp 98.2 F (36.8 C)   Wt 200 lb (90.7 kg)   SpO2 100%   BMI 38.74 kg/m   Wt Readings from Last 5 Encounters:  07/24/22 200 lb (90.7 kg)  07/06/22 200 lb 9.9 oz (91  kg)  05/08/22 202 lb 6.4 oz (91.8 kg)  04/22/22 201 lb 6.4 oz (91.4 kg)  02/05/22 203 lb 6.4 oz (92.3 kg)     Physical Exam Vitals and nursing note reviewed.  Constitutional:      General: She is not in acute distress.    Appearance: She is well-developed.  Cardiovascular:     Rate and Rhythm: Normal rate and regular rhythm.  Pulmonary:     Effort: Pulmonary effort is normal.     Breath sounds: Normal breath sounds.  Musculoskeletal:     Left shoulder: Tenderness present. Decreased range of motion.  Neurological:     Mental Status: She is alert and oriented to person, place, and time.      Lab Results:  CBC    Component Value Date/Time  WBC 4.3 05/08/2022 1021   WBC 6.0 11/14/2021 1041   RBC 4.01 05/08/2022 1021   RBC 4.00 11/14/2021 1041   HGB 10.6 (L) 05/08/2022 1021   HCT 34.0 05/08/2022 1021   PLT 253 05/08/2022 1021   MCV 85 05/08/2022 1021   MCH 26.4 (L) 05/08/2022 1021   MCH 27.5 11/14/2021 1041   MCHC 31.2 (L) 05/08/2022 1021   MCHC 32.3 11/14/2021 1041   RDW 13.1 05/08/2022 1021   LYMPHSABS 1.7 07/17/2020 1108   EOSABS 0.6 (H) 07/17/2020 1108   BASOSABS 0.1 07/17/2020 1108    BMET    Component Value Date/Time   NA 140 05/08/2022 1021   K 4.4 05/08/2022 1021   CL 103 05/08/2022 1021   CO2 24 05/08/2022 1021   GLUCOSE 107 (H) 05/08/2022 1021   GLUCOSE 112 (H) 11/14/2021 1041   BUN 13 05/08/2022 1021   CREATININE 0.96 05/08/2022 1021   CALCIUM 9.7 05/08/2022 1021   GFRNONAA >60 11/14/2021 1041   GFRAA 98 06/30/2019 1544    BNP No results found for: "BNP"  ProBNP No results found for: "PROBNP"  Imaging: DG Shoulder Left  Result Date: 07/06/2022 CLINICAL DATA:  Shoulder pain for 2 days worsened when trying to lift above head. EXAM: LEFT SHOULDER - 2+ VIEW COMPARISON:  Shoulder radiographs 05/02/2017 FINDINGS: There is no acute fracture or dislocation. Glenohumeral and acromioclavicular alignment is normal. There is mild degenerative change  at the [facility] joint, similar to 2019. The glenohumeral joint space appears preserved. The soft tissues are unremarkable. IMPRESSION: Mild degenerative change of the [facility] joint, similar to 2019. No acute finding. Electronically Signed   By: Lesia Hausen M.D.   On: 07/06/2022 11:29     Assessment & Plan:   Adhesive capsulitis of left shoulder - Ambulatory referral to Orthopedics - ketorolac (TORADOL) 30 MG/ML injection 30 mg   2. Multiple joint pain  - ANA - Rheumatoid factor - Sedimentation Rate - C-reactive protein - Ambulatory referral to Neurology   3. Loss of taste  - Ambulatory referral to Neurology   4. Generalized weakness  - Ambulatory referral to Neurology   5. Generalized pain  - Ambulatory referral to Neurology    Follow up:  Follow up as scheduled     [name], NP 07/24/2022

## 2022-07-24 NOTE — Assessment & Plan Note (Signed)
-   Ambulatory referral to Orthopedics - ketorolac (TORADOL) 30 MG/ML injection 30 mg   2. Multiple joint pain  - ANA - Rheumatoid factor - Sedimentation Rate - C-reactive protein - Ambulatory referral to Neurology   3. Loss of taste  - Ambulatory referral to Neurology   4. Generalized weakness  - Ambulatory referral to Neurology   5. Generalized pain  - Ambulatory referral to Neurology    Follow up:  Follow up as scheduled

## 2022-07-25 LAB — SEDIMENTATION RATE: Sed Rate: 26 mm/hr (ref 0–40)

## 2022-07-25 LAB — ANA: Anti Nuclear Antibody (ANA): NEGATIVE

## 2022-07-25 LAB — C-REACTIVE PROTEIN: CRP: 2 mg/L (ref 0–10)

## 2022-07-25 LAB — RHEUMATOID FACTOR: Rheumatoid fact SerPl-aCnc: <10 IU/mL (ref <14.0)

## 2022-07-27 ENCOUNTER — Other Ambulatory Visit: Payer: Self-pay

## 2022-07-28 NOTE — Telephone Encounter (Signed)
Pt wants you to call her after 12 pm

## 2022-07-28 NOTE — Telephone Encounter (Signed)
Done KH 

## 2022-07-29 ENCOUNTER — Ambulatory Visit (INDEPENDENT_AMBULATORY_CARE_PROVIDER_SITE_OTHER): Payer: Medicaid Other | Admitting: Clinical

## 2022-07-29 ENCOUNTER — Other Ambulatory Visit: Payer: Self-pay

## 2022-07-29 DIAGNOSIS — Z599 Problem related to housing and economic circumstances, unspecified: Secondary | ICD-10-CM

## 2022-07-29 NOTE — Progress Notes (Signed)
Integrated Behavioral Health Progress Note  07/30/2022 Name: Wanda Weaver MRN: [id number] DOB: 26-Aug-1960 Wanda Weaver is a 62 y.o. year old female who sees [name], NP for primary care. LCSW was initially consulted to assess the patient's needs and assit with community resources.  Interpreter: No.   Interpreter Name & Language: none  Assessment: Patient experiencing financial difficulties due to low income.  Ongoing Intervention: CSW and patient spoke on the phone. Assessed patient's needs. Patient works at [employer], but due to physical abilities and challenging interpersonal dynamics with her manager, she often does not get enough hours at work. Her partner works, but even with both of their incomes, they have not been able to afford food and utilities after paying rent. Their electric bill is past due, and patient reported they were facing a possible disconnection. Patient said they have applied for assistance with [employer], social services, and have checked with several churches for help and have not been able to find assistance.   Called [employer] together with patient. Confirmed that their balance is over $1000 and about $730 is due by 08/03/22. [employer] rep did indicate that they have not been issues a disconnect notice yet. They had a payment plan arranged, but that was cancelled by the utility company due to non-payment.   Patient is receiving $168 in food stamps. Discussed referral to [facility] for food delivery and also provided information on other food pantries in the area. Patient's partner is coming to pick up some food from the [facility] food pantry this Friday.  Patient is starting a new job at [employer] soon, she has just been waiting on them to schedule her orientation. Patient also called later in the afternoon and reported she had spoken to her daughter and her daughter is helping with the electric bill. Daughter is paying the $730 that  is due by 4/22 and patient and partner will try to pay the remaining $300 or so on the balance.   SDOH (Social Determinants of Health) assessments performed: Yes  SDOH Screenings   Food Insecurity: Food Insecurity Present (07/30/2022)  Transportation Needs: Unmet Transportation Needs (07/30/2022)  Depression (PHQ2-9): Low Risk  (04/22/2022)  Financial Resource Strain: High Risk (07/30/2022)  Tobacco Use: Low Risk  (07/24/2022)    Abigail Butts, LCSW [facility] [phone]

## 2022-07-30 ENCOUNTER — Other Ambulatory Visit: Payer: Self-pay

## 2022-07-31 ENCOUNTER — Other Ambulatory Visit: Payer: Self-pay

## 2022-08-03 ENCOUNTER — Other Ambulatory Visit: Payer: Medicaid Other | Admitting: Pharmacist

## 2022-08-03 ENCOUNTER — Other Ambulatory Visit: Payer: Self-pay

## 2022-08-03 ENCOUNTER — Other Ambulatory Visit (INDEPENDENT_AMBULATORY_CARE_PROVIDER_SITE_OTHER): Payer: Medicaid Other | Admitting: Pharmacist

## 2022-08-03 DIAGNOSIS — E118 Type 2 diabetes mellitus with unspecified complications: Secondary | ICD-10-CM

## 2022-08-03 DIAGNOSIS — I10 Essential (primary) hypertension: Secondary | ICD-10-CM

## 2022-08-03 MED ORDER — METFORMIN HCL ER 500 MG PO TB24
1000.0000 mg | ORAL_TABLET | Freq: Two times a day (BID) | ORAL | 1 refills | Status: DC
  Filled 2022-08-03: qty 120, 30d supply, fill #0
  Filled 2022-11-18: qty 120, 30d supply, fill #1

## 2022-08-03 MED ORDER — "INSULIN SYRINGE 31G X 5/16"" 0.5 ML MISC"
2 refills | Status: DC
  Filled 2022-08-03: qty 100, 90d supply, fill #0
  Filled 2022-11-03: qty 100, 90d supply, fill #1
  Filled 2023-02-12: qty 100, 90d supply, fill #2

## 2022-08-03 MED ORDER — INSULIN GLARGINE 100 UNIT/ML ~~LOC~~ SOLN
34.0000 [IU] | Freq: Every day | SUBCUTANEOUS | 11 refills | Status: DC
Start: 2022-08-03 — End: 2023-07-04
  Filled 2022-08-03 – 2022-08-13 (×6): qty 10, 29d supply, fill #0
  Filled 2022-09-10: qty 10, 29d supply, fill #1
  Filled 2022-10-09: qty 10, 29d supply, fill #2
  Filled 2022-11-03: qty 10, 29d supply, fill #3
  Filled 2022-12-01: qty 10, 29d supply, fill #4
  Filled 2022-12-27: qty 10, 29d supply, fill #5
  Filled 2023-01-21: qty 10, 29d supply, fill #6
  Filled 2023-02-12 – 2023-02-15 (×3): qty 10, 28d supply, fill #7
  Filled 2023-03-09 – 2023-03-15 (×2): qty 10, 28d supply, fill #8
  Filled 2023-04-07 – 2023-04-12 (×3): qty 10, 28d supply, fill #9
  Filled 2023-05-06 – 2023-05-10 (×2): qty 10, 28d supply, fill #10
  Filled 2023-05-31 – 2023-06-07 (×2): qty 10, 28d supply, fill #11

## 2022-08-03 MED ORDER — METOPROLOL SUCCINATE ER 50 MG PO TB24
50.0000 mg | ORAL_TABLET | Freq: Every day | ORAL | 3 refills | Status: DC
Start: 2022-08-03 — End: 2023-08-04
  Filled 2022-08-03: qty 90, 90d supply, fill #0
  Filled 2022-11-03: qty 90, 90d supply, fill #1
  Filled 2023-01-30: qty 90, 90d supply, fill #2
  Filled 2023-04-29: qty 90, 90d supply, fill #3

## 2022-08-03 MED ORDER — HYDROCHLOROTHIAZIDE 25 MG PO TABS
25.0000 mg | ORAL_TABLET | Freq: Every day | ORAL | 1 refills | Status: DC
Start: 2022-08-03 — End: 2023-02-09
  Filled 2022-08-03 – 2022-08-28 (×3): qty 90, 90d supply, fill #0
  Filled 2022-11-03 – 2022-11-06 (×2): qty 90, 90d supply, fill #1

## 2022-08-03 MED ORDER — ROSUVASTATIN CALCIUM 40 MG PO TABS
40.0000 mg | ORAL_TABLET | Freq: Every day | ORAL | 1 refills | Status: DC
  Filled 2022-08-03 – 2022-08-07 (×2): qty 90, 90d supply, fill #0
  Filled 2022-12-27: qty 90, 90d supply, fill #1

## 2022-08-03 NOTE — Progress Notes (Signed)
08/03/2022 Name: Wanda Weaver MRN: [id number] DOB: 20-Sep-1960  Chief Complaint  Patient presents with   Diabetes    Wanda Weaver is a 62 y.o. year old female who presented for a telephone visit.   They were referred to the pharmacist by their PCP for assistance in managing diabetes, hypertension, and hyperlipidemia.   Patient is participating in a Managed Medicaid Plan:  Yes  Subjective:  Care Team: Primary Care Provider: [name], NP ; Next Scheduled Visit: 08/07/22  Medication Access/Adherence  Current Pharmacy:  [facility] [address], [address] [place] [address] Phone: [phone] Fax: [phone]   Patient reports affordability concerns with their medications: No  Patient reports access/transportation concerns to their pharmacy: No  Patient reports adherence concerns with their medications:  No     Diabetes:  Current medications: Metformin 1000 mg twice daily, Lantus 34 units daily  Medications tried in the past: Jardiance - consistent burning/itching while urinating; Ozempic - severe nausea/vomiting  Current glucose readings: reports fasting was 126 this morning; reports the lowest reading she has seen has been 80, but she did feel poorly. Treated with 1/2 a poptart  Patient denies hypoglycemic s/sx including dizziness, shakiness, sweating- though has a history of hypoglycemic unawareness.   Current meal patterns: was able to pick up food from clinic pantry last week  Commercial plan denied coverage of Libre CGM without a peer to peer.   Hypertension:  Current medications: losartan 25 mg daily, metoprolol succinate 50 mg daily, HCTZ 25 mg daily:      07/24/2022    1:22 PM 07/06/2022    2:14 PM 07/06/2022   11:08 AM  Vitals with BMI  Weight 200 lbs  200 lbs 10 oz  Systolic 129 157   Diastolic 58 100   Pulse 77 73      Hyperlipidemia/ASCVD Risk Reduction  Current lipid lowering  medications: rosuvastatin 40 mg daily    Objective:  Lab Results  Component Value Date   HGBA1C 7.9 (A) 05/08/2022   HGBA1C 7.9 05/08/2022   HGBA1C 7.9 (A) 05/08/2022   HGBA1C 7.9 (A) 05/08/2022    Lab Results  Component Value Date   CREATININE 0.96 05/08/2022   BUN 13 05/08/2022   NA 140 05/08/2022   K 4.4 05/08/2022   CL 103 05/08/2022   CO2 24 05/08/2022    Lab Results  Component Value Date   CHOL 221 (H) 05/08/2022   HDL 47 05/08/2022   LDLCALC 150 (H) 05/08/2022   TRIG 131 05/08/2022   CHOLHDL 4.7 (H) 05/08/2022    Medications Reviewed Today     Reviewed by Alden Hipp, RPH-CPP (Pharmacist) on 08/03/22 at 1403  Med List Status: <None>   Medication Order Taking? Sig Documenting Provider Last Dose Status Informant  Accu-Chek Softclix Lancets lancets [id number]  Use as instructed [name], NP  Active   acetaminophen (TYLENOL) 500 MG tablet [id number]  Take 500 mg by mouth every 6 (six) hours as needed. [provider]  Active            Med Note Sherilyn Cooter, [facility]   Wed Apr 22, 2022  8:54 AM) Prn   albuterol (VENTOLIN HFA) 108 (90 Base) MCG/ACT inhaler [id number]  Inhale 2 puffs into the lungs every 6 (six) hours as needed. [name], NP  Active   blood glucose meter kit and supplies KIT [id number]  Use up to four times daily as directed. Tanda Rockers,  Gildardo Pounds, NP  Active   Continuous Blood Gluc Receiver (FREESTYLE LIBRE 2 READER) DEVI [id number] No Use to check glucose continuously  Patient not taking: Reported on 07/24/2022   [name], NP Not Taking Active   Continuous Blood Gluc Sensor (FREESTYLE LIBRE 2 SENSOR) MISC [id number] No Apply a new sensor every 14 days. Use to check glucose continuously  Patient not taking: Reported on 07/24/2022   [name], NP Not Taking Active   cyclobenzaprine (FLEXERIL) 10 MG tablet [id number] Yes Take 1 tablet (10 mg total) by mouth 2 (two) times daily as needed for muscle spasms. Fayrene Helper,  PA-C Taking Active   diazepam (VALIUM) 5 MG tablet [id number] No Take 1 tablet (5 mg total) by mouth every 12 (twelve) hours as needed for anxiety.  Patient not taking: Reported on 07/24/2022   Cammy Copa, MD Not Taking Active   docusate sodium (COLACE) 100 MG capsule [id number]  Take 1 capsule (100 mg total) by mouth 2 (two) times daily.  Patient not taking: Reported on 07/24/2022   [name], NP  Active   Elastic Bandages & Supports (KNEE BRACE ADJUSTABLE HINGED) MISC [id number]  1 application by Does not apply route daily.  Patient not taking: Reported on 07/24/2022   Barbette Merino, NP  Active   glucose blood (ACCU-CHEK GUIDE) test strip [id number]  Use as instructed  Patient not taking: Reported on 07/24/2022   [name], NP  Active   hydrochlorothiazide (HYDRODIURIL) 25 MG tablet [id number] Yes Take 1 tablet (25 mg total) by mouth daily. [name], NP Taking Active   hydrocortisone (ANUSOL-HC) 25 MG suppository [id number]  Place 1 suppository (25 mg total) rectally 2 (two) times daily.  Patient not taking: Reported on 07/24/2022   [name], NP  Active   hydrOXYzine (VISTARIL) 25 MG capsule [id number] Yes Take 1 capsule (25 mg total) by mouth every 8 (eight) hours as needed. [name], NP Taking Active   ibuprofen (ADVIL) 600 MG tablet [id number]  Take 1 tablet (600 mg total) by mouth every 6 (six) hours as needed. Fayrene Helper, PA-C  Active            Med Note Sherilyn Cooter, [facility]   Fri Jul 24, 2022  1:17 PM) Prn   insulin glargine (LANTUS) 100 UNIT/ML injection [id number] Yes Inject 0.34 mLs (34 Units total) into the skin daily. [name], NP Taking Active   losartan (COZAAR) 25 MG tablet [id number] Yes Take 1 tablet (25 mg total) by mouth daily. [name], NP Taking Active   metFORMIN (GLUCOPHAGE-XR) 500 MG 24 hr tablet [id number] Yes Take 2 tablets (1,000 mg total) by mouth 2 (two) times daily with a meal. [name], NP Taking Active    methocarbamol (ROBAXIN) 500 MG tablet [id number]  Take 1 tablet (500 mg total) by mouth every 8 (eight) hours as needed for muscle spasms. Cammy Copa, MD  Active   metoprolol succinate (TOPROL-XL) 50 MG 24 hr tablet [id number] Yes Take 1 tablet (50 mg total) by mouth daily. Take with or immediately following a meal. [name], NP Taking Active   omeprazole (PRILOSEC) 20 MG capsule [id number]  Take 1 capsule (20 mg total) by mouth daily. [name], NP  Active   ondansetron (ZOFRAN) 4 MG tablet [id number]  Take 1 tablet (4 mg total) by mouth every 8 (eight) hours as needed for nausea or vomiting. Donell Beers, FNP  Active            Med Note [name]   Wed Apr 22, 2022  8:57 AM) prn  rosuvastatin (CRESTOR) 40 MG tablet [id number] Yes Take 1 tablet (40 mg total) by mouth daily. [name], NP Taking Active               Assessment/Plan:   Diabetes: - Currently uncontrolled - Reviewed long term cardiovascular and renal outcomes of uncontrolled blood sugar - Reviewed goal A1c, goal fasting, and goal 2 hour post prandial glucose - Reviewed dietary modifications including: focus on proteins, fruits and vegetables.  - Recommend to continue current regimen. A1c at upcoming appointment.  - Recommend to check glucose at least twice daily; will continue to work on CGM access.  - Patient requested refills; collaborated with PCP on this.   Hypertension: - Currently controlled per last office reading on 07/24/22  - Reviewed long term cardiovascular and renal outcomes of uncontrolled blood pressure - Recommend to continue current regimen at this time. Patient requested refills; collaborated with PCP on this.    Hyperlipidemia/ASCVD Risk Reduction: - Currently uncontrolled, but likely related to medication access. Anticipate improvement - Recommend to continue current regimen at this time. Check lipids with upcoming appointment.  - Patient requested refills;  collaborated with PCP on this.  Follow Up Plan: phone call in ~6 weeks  Catie Eppie Gibson, PharmD, BCACP, CPP [facility] [phone]

## 2022-08-03 NOTE — Patient Instructions (Signed)
Wanda Weaver,   It was great talking to you today!

## 2022-08-03 NOTE — Progress Notes (Signed)
Error

## 2022-08-04 ENCOUNTER — Other Ambulatory Visit: Payer: Self-pay

## 2022-08-05 ENCOUNTER — Other Ambulatory Visit: Payer: Self-pay

## 2022-08-06 ENCOUNTER — Telehealth: Payer: Self-pay | Admitting: Clinical
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
## 2022-08-07 ENCOUNTER — Ambulatory Visit: Payer: Medicaid Other | Admitting: Nurse Practitioner

## 2022-08-07 ENCOUNTER — Other Ambulatory Visit (INDEPENDENT_AMBULATORY_CARE_PROVIDER_SITE_OTHER): Payer: Medicaid Other

## 2022-08-07 ENCOUNTER — Ambulatory Visit: Payer: Medicaid Other | Admitting: Surgical

## 2022-08-07 ENCOUNTER — Other Ambulatory Visit: Payer: Self-pay

## 2022-08-07 ENCOUNTER — Other Ambulatory Visit (HOSPITAL_COMMUNITY): Payer: Self-pay

## 2022-08-07 DIAGNOSIS — M542 Cervicalgia: Secondary | ICD-10-CM

## 2022-08-09 ENCOUNTER — Encounter: Payer: Self-pay | Admitting: Orthopedic Surgery

## 2022-08-09 NOTE — Progress Notes (Signed)
Office Visit Note   Patient: Wanda Weaver           Date of Birth: 1960-10-18           MRN: [id number] Visit Date: 08/07/2022 Requested by: [name], NP [phone] [address] [place],  [place] [address] PCP: [name], NP  Subjective: Chief Complaint  Patient presents with   Left Shoulder - Pain    HPI: Wanda Weaver is a 62 y.o. female who presents to the office reporting left shoulder pain.  Patient has history of left shoulder pain that has been ongoing for 1 to 2 months.  She denies any history of injury to the shoulder.  Localizes pain to the posterior as well as anterior aspect of the shoulder with radiation to the elbow primarily.  She also does have some occasional radicular pain that extends down to her hand with medial border scapular pain and neck pain that is primarily worse in the morning.  She denies any numbness or tingling.  She does feel that her left arm is weak in general and she cannot lay on her left arm at night.  Primarily notices that she has pain when she abduction her shoulder.  No history of prior surgery to her neck or shoulder.  Taking Flexeril and ibuprofen with minimal relief.  Does have history of diabetes and her blood sugar usually runs about 150 in the morning.  She is right-hand dominant.  She was evaluated for right shoulder pain by Dr. August Saucer several months ago but she states that her right shoulder pain is overall improved and she does not wish to work this shoulder up any further.  She is going to work at [employer] after leaving a job at [employer].  She also notices some blurry vision at times.  No new headaches.  She has a history of neurologic problems in her family with greater than 5 people on her father side having been diagnosed with multiple sclerosis..                ROS: All systems reviewed are negative as they relate to the chief complaint within the history of present illness.  Patient denies fevers or chills.  Assessment & Plan: Visit  Diagnoses:  1. Cervicalgia     Plan: Patient is a 62 year old female who presents for evaluation of left shoulder pain.  She has definite loss of passive motion of the left shoulder relative to the right that along with her history of diabetes and her negative shoulder radiographs likely point towards adhesive capsulitis as the main driver of pain in her shoulder.  We discussed options available to patient and she would like to avoid injection for now.  Will proceed with home exercise program of the shoulder to work on range of motion exercises.  However, adhesive capsulitis does not explain why she is having radicular pain down the entire length of the extremity at times as well as the associated medial border scapular pain and neck pain that she has.  She is having blurry vision at times as well which, along with her family history of neurologic disorder, is suspicious for multiple sclerosis.  Plan to order MRI cervical spine to evaluate radiculopathy as well as MRI of the brain to evaluate for demyelinating disease.  Follow-up after imaging studies to review results.  Follow-Up Instructions: No follow-ups on file.   Orders:  Orders Placed This Encounter  Procedures   XR Cervical Spine 2  or 3 views   MR Cervical Spine w/o contrast   MR BRAIN WO CONTRAST   No orders of the defined types were placed in this encounter.     Procedures: No procedures performed   Clinical Data: No additional findings.  Objective: Vital Signs: There were no vitals taken for this visit.  Physical Exam:  Constitutional: Patient appears well-developed HEENT:  Head: Normocephalic Eyes:EOM are normal Neck: Normal range of motion Cardiovascular: Normal rate Pulmonary/chest: Effort normal Neurologic: Patient is alert Skin: Skin is warm Psychiatric: Patient has normal mood and affect  Ortho Exam: Ortho exam demonstrates left shoulder with 30 degrees X rotation, 65 degrees abduction, 120 degrees forward  elevation passively and actively.  This compared with the right shoulder with 60 degrees X rotation, 100 degrees abduction, 165 degrees forward elevation passively and actively.  She has tenderness over the bicipital groove.  Positive O'Brien sign.  Positive Neer sign.  Negative Hawkins sign.  Intact EPL, FPL, finger abduction, finger adduction, pronation/supination, bicep, tricep, deltoid.  She does have weakness of grip strength, pronation/supination, finger abduction, bicep flexion of the left arm that is rated 4/5 compared with the right arm which is rated 5/5.  She has 5 -/5 infraspinatus strength with 5/5 supraspinatus and subscapularis strength.  There is no coarseness noted with passive motion of the shoulder.  She has mild to moderate tenderness over the acromioclavicular joint.  Mild tenderness throughout the axial cervical spine.  Negative Spurling sign.  Negative Lhermitte sign.  Negative Hoffmann sign.  Specialty Comments:  No specialty comments available.  Imaging: No results found.   PMFS History: Patient Active Problem List   Diagnosis Date Noted   Adhesive capsulitis of left shoulder 07/24/2022   Constipation 05/25/2022   Osteoarthritis of right shoulder 04/22/2022   Bilateral lower extremity pain 08/20/2019   Bilateral lower extremity edema 08/20/2019   Hemoglobin A1C greater than 9%, indicating poor diabetic control 02/21/2019   Weight loss 02/21/2019   Muscle strain of wrist, left, initial encounter 09/22/2018   Hyperglycemia 08/15/2018   Essential hypertension 04/25/2018   Class 1 obesity due to excess calories with serious comorbidity in adult 04/25/2018   Right foot pain 04/25/2018   Anxiety 04/25/2018   Type 2 diabetes mellitus with complication, with long-term current use of insulin (HCC) 08/06/2017   Past Medical History:  Diagnosis Date   Bilateral lower extremity edema 07/2019   Bilateral lower extremity pain 07/2019   Diabetes mellitus without complication  (HCC)    Hemoglobin A1C greater than 9%, indicating poor diabetic control    Hyperlipidemia    Hypertension    Vitamin D deficiency     Family History  Problem Relation Age of Onset   Diabetes Mother    Dementia Mother    Diabetes Father    Hypertension Father     Past Surgical History:  Procedure Laterality Date   APPENDECTOMY     CARPAL TUNNEL RELEASE Bilateral    CHOLECYSTECTOMY     WRIST SURGERY Bilateral    Social History   Occupational History   Not on file  Tobacco Use   Smoking status: Never   Smokeless tobacco: Never  Vaping Use   Vaping Use: Never used  Substance and Sexual Activity   Alcohol use: No   Drug use: No   Sexual activity: Yes    Partners: Male

## 2022-08-10 ENCOUNTER — Other Ambulatory Visit: Payer: Self-pay

## 2022-08-10 ENCOUNTER — Telehealth: Payer: Self-pay | Admitting: Clinical

## 2022-08-10 ENCOUNTER — Telehealth: Payer: Self-pay
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
## 2022-08-10 NOTE — Telephone Encounter (Signed)
Wanda Weaver wants Brain MRI order changed to with and without contrast please.

## 2022-08-10 NOTE — Telephone Encounter (Signed)
Done order changed

## 2022-08-13 ENCOUNTER — Other Ambulatory Visit: Payer: Self-pay

## 2022-08-13 ENCOUNTER — Telehealth: Payer: Self-pay | Admitting: Orthopedic Surgery

## 2022-08-13 NOTE — Telephone Encounter (Signed)
Patient called in because she has question concerning her neck Xrays from previous visit, would like a nurse to call

## 2022-08-14 ENCOUNTER — Ambulatory Visit: Payer: Medicaid Other | Admitting: Nurse Practitioner

## 2022-08-14 NOTE — Telephone Encounter (Signed)
I called patient.  Discussed x-ray findings.  Will see what her MRI scans show.

## 2022-08-17 ENCOUNTER — Other Ambulatory Visit: Payer: Self-pay

## 2022-08-18 ENCOUNTER — Telehealth: Payer: Self-pay | Admitting: Clinical
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
## 2022-08-23 ENCOUNTER — Other Ambulatory Visit: Payer: Medicaid Other

## 2022-08-24 ENCOUNTER — Other Ambulatory Visit: Payer: Self-pay

## 2022-08-28 ENCOUNTER — Other Ambulatory Visit: Payer: Self-pay

## 2022-09-11 ENCOUNTER — Ambulatory Visit (INDEPENDENT_AMBULATORY_CARE_PROVIDER_SITE_OTHER): Payer: Medicaid Other | Admitting: Nurse Practitioner

## 2022-09-11 ENCOUNTER — Other Ambulatory Visit: Payer: Self-pay

## 2022-09-11 ENCOUNTER — Encounter: Payer: Self-pay | Admitting: Nurse Practitioner

## 2022-09-11 VITALS — HR 88 | Wt 197.0 lb

## 2022-09-11 DIAGNOSIS — F419 Anxiety disorder, unspecified: Secondary | ICD-10-CM

## 2022-09-11 DIAGNOSIS — R55 Syncope and collapse: Secondary | ICD-10-CM

## 2022-09-11 DIAGNOSIS — R519 Headache, unspecified: Secondary | ICD-10-CM | POA: Diagnosis not present

## 2022-09-11 DIAGNOSIS — E118 Type 2 diabetes mellitus with unspecified complications: Secondary | ICD-10-CM | POA: Diagnosis not present

## 2022-09-11 DIAGNOSIS — Z794 Long term (current) use of insulin: Secondary | ICD-10-CM

## 2022-09-11 MED ORDER — FLUOXETINE HCL 10 MG PO CAPS
10.0000 mg | ORAL_CAPSULE | Freq: Every day | ORAL | 2 refills | Status: DC
Start: 2022-09-11 — End: 2022-12-04
  Filled 2022-09-11: qty 30, 30d supply, fill #0

## 2022-09-11 NOTE — Progress Notes (Unsigned)
@Patient  ID: Wanda Weaver, female    DOB: [birth year], 62 y.o.   MRN: [id number]  Chief Complaint  Patient presents with   Loss of Consciousness    Last three days     Referring provider: [name], NP   HPI  Wanda Weaver is a 62 y.o. female who presents for follow-up of Type 2 diabetes mellitus. Has a past medical history of Bilateral lower extremity edema (07/2019), Bilateral lower extremity pain (07/2019), Diabetes mellitus without complication (HCC), Hemoglobin A1C greater than 9%, indicating poor diabetic control, Hyperlipidemia, Hypertension, and Vitamin D deficiency.    Patient presents today for an acute visit.  She states that she has had 3-4 episodes of presyncope over the past few days.  She did not go to the emergency room or call EMS during any of these episodes.  She did check her blood sugar and it was stable.  Blood pressures have been stable.  Patient states that she did have associated headache with these presyncopal episodes.  Patient does skip meals at times.  We discussed that she does need to eat either 3 meals a day or 6 snacks throughout the day to keep blood sugars stable.  She does need to eat a heart healthy diet.  We will refer her to neurology and cardiology for further evaluation.  Patient has been having a lot of stress lately due to changing jobs and not having enough money for living expenses and groceries.  We did give her food from the food bank today.  We will start her on low-dose Prozac for anxiety and depression.  Denies f/c/s, n/v/d, hemoptysis, PND, leg swelling Denies chest pain or edema         No Known Allergies  Immunization History  Administered Date(s) Administered   Pneumococcal Polysaccharide-23 08/06/2017    Past Medical History:  Diagnosis Date   Bilateral lower extremity edema 07/2019   Bilateral lower extremity pain 07/2019   Diabetes mellitus without complication (HCC)    Hemoglobin A1C greater than 9%, indicating poor  diabetic control    Hyperlipidemia    Hypertension    Vitamin D deficiency     Tobacco History: Social History   Tobacco Use  Smoking Status Never  Smokeless Tobacco Never   Counseling given: Not Answered   Outpatient Encounter Medications as of 09/11/2022  Medication Sig   Accu-Chek Softclix Lancets lancets Use as instructed   acetaminophen (TYLENOL) 500 MG tablet Take 500 mg by mouth every 6 (six) hours as needed.   albuterol (VENTOLIN HFA) 108 (90 Base) MCG/ACT inhaler Inhale 2 puffs into the lungs every 6 (six) hours as needed.   blood glucose meter kit and supplies KIT Use up to four times daily as directed.   cyclobenzaprine (FLEXERIL) 10 MG tablet Take 1 tablet (10 mg total) by mouth 2 (two) times daily as needed for muscle spasms.   Elastic Bandages & Supports (KNEE BRACE ADJUSTABLE HINGED) MISC 1 application by Does not apply route daily.   FLUoxetine (PROZAC) 10 MG capsule Take 1 capsule (10 mg total) by mouth daily.   hydrochlorothiazide (HYDRODIURIL) 25 MG tablet Take 1 tablet (25 mg total) by mouth daily.   hydrOXYzine (VISTARIL) 25 MG capsule Take 1 capsule (25 mg total) by mouth every 8 (eight) hours as needed.   insulin glargine (LANTUS) 100 UNIT/ML injection Inject 0.34 mLs (34 Units total) into the skin daily.   Insulin Syringe-Needle U-100 (INSULIN SYRINGE .5CC/31GX5/16") 31G X 5/16" 0.5 ML MISC Use  to inject insulin daily   losartan (COZAAR) 25 MG tablet Take 1 tablet (25 mg total) by mouth daily.   metFORMIN (GLUCOPHAGE-XR) 500 MG 24 hr tablet Take 2 tablets (1,000 mg total) by mouth 2 (two) times daily with a meal.   methocarbamol (ROBAXIN) 500 MG tablet Take 1 tablet (500 mg total) by mouth every 8 (eight) hours as needed for muscle spasms.   metoprolol succinate (TOPROL-XL) 50 MG 24 hr tablet Take 1 tablet (50 mg total) by mouth daily. Take with or immediately following a meal.   omeprazole (PRILOSEC) 20 MG capsule Take 1 capsule (20 mg total) by mouth daily.    ondansetron (ZOFRAN) 4 MG tablet Take 1 tablet (4 mg total) by mouth every 8 (eight) hours as needed for nausea or vomiting.   rosuvastatin (CRESTOR) 40 MG tablet Take 1 tablet (40 mg total) by mouth daily.   Continuous Blood Gluc Receiver (FREESTYLE LIBRE 2 READER) DEVI Use to check glucose continuously (Patient not taking: Reported on 07/24/2022)   Continuous Blood Gluc Sensor (FREESTYLE LIBRE 2 SENSOR) MISC Apply a new sensor every 14 days. Use to check glucose continuously (Patient not taking: Reported on 07/24/2022)   diazepam (VALIUM) 5 MG tablet Take 1 tablet (5 mg total) by mouth every 12 (twelve) hours as needed for anxiety. (Patient not taking: Reported on 07/24/2022)   docusate sodium (COLACE) 100 MG capsule Take 1 capsule (100 mg total) by mouth 2 (two) times daily. (Patient not taking: Reported on 07/24/2022)   glucose blood (ACCU-CHEK GUIDE) test strip Use as instructed (Patient not taking: Reported on 07/24/2022)   hydrocortisone (ANUSOL-HC) 25 MG suppository Place 1 suppository (25 mg total) rectally 2 (two) times daily. (Patient not taking: Reported on 09/11/2022)   ibuprofen (ADVIL) 600 MG tablet Take 1 tablet (600 mg total) by mouth every 6 (six) hours as needed. (Patient not taking: Reported on 09/11/2022)   No facility-administered encounter medications on file as of 09/11/2022.     Review of Systems  Review of Systems     Physical Exam  Pulse 88   Wt 197 lb (89.4 kg)   SpO2 99%   BMI 38.16 kg/m   Wt Readings from Last 5 Encounters:  09/11/22 197 lb (89.4 kg)  07/24/22 200 lb (90.7 kg)  07/06/22 200 lb 9.9 oz (91 kg)  05/08/22 202 lb 6.4 oz (91.8 kg)  04/22/22 201 lb 6.4 oz (91.4 kg)     Physical Exam   Lab Results:  CBC    Component Value Date/Time   WBC 4.3 05/08/2022 1021   WBC 6.0 11/14/2021 1041   RBC 4.01 05/08/2022 1021   RBC 4.00 11/14/2021 1041   HGB 10.6 (L) 05/08/2022 1021   HCT 34.0 05/08/2022 1021   PLT 253 05/08/2022 1021   MCV 85  05/08/2022 1021   MCH 26.4 (L) 05/08/2022 1021   MCH 27.5 11/14/2021 1041   MCHC 31.2 (L) 05/08/2022 1021   MCHC 32.3 11/14/2021 1041   RDW 13.1 05/08/2022 1021   LYMPHSABS 1.7 07/17/2020 1108   EOSABS 0.6 (H) 07/17/2020 1108   BASOSABS 0.1 07/17/2020 1108    BMET    Component Value Date/Time   NA 140 05/08/2022 1021   K 4.4 05/08/2022 1021   CL 103 05/08/2022 1021   CO2 24 05/08/2022 1021   GLUCOSE 107 (H) 05/08/2022 1021   GLUCOSE 112 (H) 11/14/2021 1041   BUN 13 05/08/2022 1021   CREATININE 0.96 05/08/2022 1021   CALCIUM 9.7  05/08/2022 1021   GFRNONAA >60 11/14/2021 1041   GFRAA 98 06/30/2019 1544    No results found.   Assessment & Plan:   Near syncope - CBC - Comprehensive metabolic panel - Ambulatory referral to Cardiology - Ambulatory referral to Neurology  Stay well hydrated  Get plenty of rest  Heart healthy diet   2. Type 2 diabetes mellitus with complication, with long-term current use of insulin (HCC)  - Hemoglobin A1c   3. Nonintractable headache, unspecified chronicity pattern, unspecified headache type  - Ambulatory referral to Neurology   Follow up:  Go to the ED if symptoms persist over the weekend  Follow up in 1 month     [name], NP 09/14/2022

## 2022-09-11 NOTE — Patient Instructions (Addendum)
1. Near syncope  - CBC - Comprehensive metabolic panel - Ambulatory referral to Cardiology - Ambulatory referral to Neurology  Stay well hydrated  Get plenty of rest  Heart healthy diet   2. Type 2 diabetes mellitus with complication, with long-term current use of insulin (HCC)  - Hemoglobin A1c   3. Nonintractable headache, unspecified chronicity pattern, unspecified headache type  - Ambulatory referral to Neurology   Follow up:  Go to the ED if symptoms persist over the weekend  Follow up in 1 month      Syncope, Adult  Syncope is when you pass out or faint for a short time. It is caused by a sudden decrease in blood flow to the brain. This can happen for many reasons. It can sometimes happen when seeing blood, getting a shot (injection), or having pain or strong emotions. Most causes of fainting are not dangerous, but in some cases it can be a sign of a serious medical problem. If you faint, get help right away. Call your local emergency services (911 in the [place] Follow these instructions at home: Watch for any changes in your symptoms. Take these actions to stay safe and help with your symptoms: Knowing when you may be about to faint Signs that you may be about to faint include: Feeling dizzy or light-headed. It may feel like the room is spinning. Feeling weak. Feeling like you may vomit (nauseous). Seeing spots or seeing all white or all black. Having cold, clammy skin. Feeling warm and sweaty. Hearing ringing in the ears. If you start to feel like you might faint, sit or lie down right away. If sitting, lower your head down between your legs. If lying down, raise (elevate) your feet above the level of your heart. Breathe deeply and steadily. Wait until all of the symptoms are gone. Have someone stay with you until you feel better. Medicines Take over-the-counter and prescription medicines only as told by your doctor. If you are taking blood pressure or  heart medicine, sit up and stand up slowly. Spend a few minutes getting ready to sit and then stand. This can help you feel less dizzy. Lifestyle Do not drive, use machinery, or play sports until your doctor says it is okay. Do not drink alcohol. Do not smoke or use any products that contain nicotine or tobacco. If you need help quitting, ask your doctor. Avoid hot tubs and saunas. General instructions Talk with your doctor about your symptoms. You may need to have testing to help find the cause. Drink enough fluid to keep your pee (urine) pale yellow. Avoid standing for a long time. If you must stand for a long time, do movements such as: Moving your legs. Crossing your legs. Flexing and stretching your leg muscles. Squatting. Keep all follow-up visits. Contact a doctor if: You have episodes of near fainting. Get help right away if: You pass out or faint. You hit your head or are injured after fainting. You have any of these symptoms: Fast or uneven heartbeats (palpitations). Pain in your chest, belly, or back. Shortness of breath. You have jerky movements that you cannot control (seizure). You have a very bad headache. You are confused. You have problems with how you see (vision). You are very weak. You have trouble walking. You are bleeding from your mouth or your butt (rectum). You have black or tarry poop (stool). These symptoms may be an emergency. Get help right away. Call your local emergency services (911 in the [place]  Do not wait to see if the symptoms will go away. Do not drive yourself to the hospital. Summary Syncope is when you pass out or faint for a short time. It is caused by a sudden decrease in blood flow to the brain. Signs that you may be about to faint include feeling dizzy or light-headed, feeling like you may vomit, seeing all white or all black, or having cold, clammy skin. If you start to feel like you might faint, sit or lie down right away. Lower your  head if sitting, or raise (elevate) your feet if lying down. Breathe deeply and steadily. Wait until all of the symptoms are gone. This information is not intended to replace advice given to you by your health care provider. Make sure you discuss any questions you have with your health care provider. Document Revised: 08/08/2020 Document Reviewed: 08/08/2020 Elsevier Patient Education  2024 [employer].

## 2022-09-14 ENCOUNTER — Encounter: Payer: Self-pay | Admitting: Nurse Practitioner

## 2022-09-14 ENCOUNTER — Other Ambulatory Visit: Payer: Self-pay

## 2022-09-14 DIAGNOSIS — R55 Syncope and collapse: Secondary | ICD-10-CM | POA: Insufficient documentation

## 2022-09-14 NOTE — Assessment & Plan Note (Signed)
-   CBC - Comprehensive metabolic panel - Ambulatory referral to Cardiology - Ambulatory referral to Neurology  Stay well hydrated  Get plenty of rest  Heart healthy diet   2. Type 2 diabetes mellitus with complication, with long-term current use of insulin (HCC)  - Hemoglobin A1c   3. Nonintractable headache, unspecified chronicity pattern, unspecified headache type  - Ambulatory referral to Neurology   Follow up:  Go to the ED if symptoms persist over the weekend  Follow up in 1 month

## 2022-09-15 LAB — COMPREHENSIVE METABOLIC PANEL
ALT: 19 IU/L (ref 0–32)
AST: 16 IU/L (ref 0–40)
Albumin/Globulin Ratio: 1.3 (ref 1.2–2.2)
Albumin: 3.9 g/dL (ref 3.9–4.9)
Alkaline Phosphatase: 60 IU/L (ref 44–121)
BUN/Creatinine Ratio: 11 — ABNORMAL LOW (ref 12–28)
BUN: 13 mg/dL (ref 8–27)
Bilirubin Total: <0.2 mg/dL (ref 0.0–1.2)
CO2: 22 mmol/L (ref 20–29)
Calcium: 9 mg/dL (ref 8.7–10.3)
Chloride: 108 mmol/L — ABNORMAL HIGH (ref 96–106)
Creatinine, Ser: 1.16 mg/dL — ABNORMAL HIGH (ref 0.57–1.00)
Globulin, Total: 3 g/dL (ref 1.5–4.5)
Glucose: 92 mg/dL (ref 70–99)
Potassium: 4.1 mmol/L (ref 3.5–5.2)
Sodium: 142 mmol/L (ref 134–144)
Total Protein: 6.9 g/dL (ref 6.0–8.5)
eGFR: 54 mL/min/{1.73_m2} — ABNORMAL LOW (ref >59)

## 2022-09-15 LAB — HEMOGLOBIN A1C
Est. average glucose Bld gHb Est-mCnc: 128 mg/dL
Hgb A1c MFr Bld: 6.1 % — ABNORMAL HIGH (ref 4.8–5.6)

## 2022-09-15 LAB — CBC
Hematocrit: 35.2 % (ref 34.0–46.6)
Hemoglobin: 11.5 g/dL (ref 11.1–15.9)
MCH: 29.2 pg (ref 26.6–33.0)
MCHC: 32.7 g/dL (ref 31.5–35.7)
MCV: 89 fL (ref 79–97)
Platelets: 276 10*3/uL (ref 150–450)
RBC: 3.94 x10E6/uL (ref 3.77–5.28)
RDW: 15.3 % (ref 11.7–15.4)
WBC: 7.4 10*3/uL (ref 3.4–10.8)

## 2022-10-02 ENCOUNTER — Telehealth: Payer: Self-pay | Admitting: Clinical

## 2022-10-02 NOTE — Telephone Encounter (Signed)
Integrated Behavioral Health Progress Note  10/02/2022 Name: Wanda Weaver MRN: [id number] DOB: 1960/12/31 Wanda Weaver is a 62 y.o. year old female who sees [name], NP for primary care. LCSW was initially consulted to assess the patient's needs and assit with community resources.  Interpreter: No.   Interpreter Name & Language: none  Assessment: Patient experiencing financial difficulties due to low income.  Ongoing Intervention: Patient called and inquired about rental assistance. She and her partner are not able to pay their full rent this month, though they can make about two thirds of it. Patient has been getting far fewer hours at work than she was told she would get when starting this job. Applied with [employer] for assistance with the remaining rent that they cannot cover.  Abigail Butts, LCSW Patient [facility] [phone]

## 2022-10-04 ENCOUNTER — Encounter (HOSPITAL_COMMUNITY): Payer: Self-pay | Admitting: Emergency Medicine

## 2022-10-04 ENCOUNTER — Emergency Department (HOSPITAL_COMMUNITY): Payer: Medicaid Other

## 2022-10-04 ENCOUNTER — Emergency Department (HOSPITAL_COMMUNITY)
Admission: EM | Admit: 2022-10-04 | Discharge: 2022-10-04 | Disposition: A | Discharge status: Home / Self Care | Payer: Medicaid Other | Attending: Emergency Medicine | Admitting: Emergency Medicine

## 2022-10-04 DIAGNOSIS — W2209XA Striking against other stationary object, initial encounter: Secondary | ICD-10-CM | POA: Diagnosis not present

## 2022-10-04 DIAGNOSIS — S99921A Unspecified injury of right foot, initial encounter: Secondary | ICD-10-CM | POA: Diagnosis present

## 2022-10-04 DIAGNOSIS — S92535A Nondisplaced fracture of distal phalanx of left lesser toe(s), initial encounter for closed fracture: Secondary | ICD-10-CM | POA: Diagnosis not present

## 2022-10-04 MED ORDER — CELECOXIB 200 MG PO CAPS: 200.0000 mg | ORAL_CAPSULE | Freq: Two times a day (BID) | ORAL | 0 refills | Status: DC

## 2022-10-04 MED ORDER — OXYCODONE HCL 5 MG PO TABS
5.0000 mg | ORAL_TABLET | Freq: Once | ORAL | Status: AC
  Administered 2022-10-04: 5 mg via ORAL
  Filled 2022-10-04: qty 1

## 2022-10-04 NOTE — ED Triage Notes (Signed)
Pt reports tripping last night while getting in the shower. Left foot went into trash can. Bruising noted to left toes and pt reports pain to top and bottom of foot. Pt took muscle relaxer around 8 this morning.

## 2022-10-04 NOTE — ED Provider Notes (Signed)
[place] EMERGENCY DEPARTMENT AT [facility] Provider Note   CSN: [id number] Arrival date & time: 10/04/22  1723     History Chief Complaint  Patient presents with   Foot Injury    HPI Wanda Weaver is a 62 y.o. female presenting for chief complaint of left foot pain.  States that she kicked a trash can in the bathroom last night immediate deformity and pain in the left foot..   Patient's recorded medical, surgical, social, medication list and allergies were reviewed in the Snapshot window as part of the initial history.   Review of Systems   Review of Systems  Constitutional:  Negative for chills and fever.  HENT:  Negative for ear pain and sore throat.   Eyes:  Negative for pain and visual disturbance.  Respiratory:  Negative for cough and shortness of breath.   Cardiovascular:  Negative for chest pain and palpitations.  Gastrointestinal:  Negative for abdominal pain and vomiting.  Genitourinary:  Negative for dysuria and hematuria.  Musculoskeletal:  Negative for arthralgias and back pain.  Skin:  Negative for color change and rash.  Neurological:  Negative for seizures and syncope.  All other systems reviewed and are negative.   Physical Exam Updated Vital Signs BP (!) 170/79 (BP Location: Right Arm)   Pulse 74   Temp 98 F (36.7 C) (Oral)   Resp 19   Ht 5' (1.524 m)   Wt 89 kg   SpO2 100%   BMI 38.32 kg/m  Physical Exam Vitals and nursing note reviewed.  Constitutional:      General: She is not in acute distress.    Appearance: She is well-developed.  HENT:     Head: Normocephalic and atraumatic.  Eyes:     Conjunctiva/sclera: Conjunctivae normal.  Cardiovascular:     Rate and Rhythm: Normal rate and regular rhythm.     Heart sounds: No murmur heard. Pulmonary:     Effort: Pulmonary effort is normal. No respiratory distress.     Breath sounds: Normal breath sounds.  Abdominal:     General: There is no distension.     Palpations: Abdomen is  soft.     Tenderness: There is no abdominal tenderness. There is no right CVA tenderness or left CVA tenderness.  Musculoskeletal:        General: Swelling and deformity (Obvious bruising deformity of the fourth digit of the left foot.) present. No tenderness. Normal range of motion.     Cervical back: Neck supple.  Skin:    General: Skin is warm and dry.  Neurological:     General: No focal deficit present.     Mental Status: She is alert and oriented to person, place, and time. Mental status is at baseline.     Cranial Nerves: No cranial nerve deficit.      ED Course/ Medical Decision Making/ A&P    Procedures Procedures   Medications Ordered in ED Medications  oxyCODONE (Oxy IR/ROXICODONE) immediate release tablet 5 mg (5 mg Oral Given 10/04/22 1742)    Medical Decision Making:   62 year old female with minor trauma to the left foot.  Appears most consistent with a distal phalangeal fracture.  X-ray to evaluate for dislocation or underlying metacarpal fracture given severity of pain. X-ray performed demonstrates no acute pathology.  Either subclinical fracture or simple soft tissue injury. Patient's pain treated with oxycodone p.o. will transition patient to celecoxib Tylenol and recommend she follow-up with her podiatrist for long-term management.. Clinical Impression:  1. Closed nondisplaced fracture of distal phalanx of lesser toe of left foot, initial encounter      Discharge   Final Clinical Impression(s) / ED Diagnoses Final diagnoses:  Closed nondisplaced fracture of distal phalanx of lesser toe of left foot, initial encounter    Rx / DC Orders ED Discharge Orders          Ordered    celecoxib (CELEBREX) 200 MG capsule  2 times daily        10/04/22 1855              Glyn Ade, MD 10/04/22 2152

## 2022-10-06 ENCOUNTER — Telehealth: Payer: Self-pay

## 2022-10-06 NOTE — Transitions of Care (Post Inpatient/ED Visit) (Cosign Needed)
10/06/2022  Name: Wanda Weaver MRN: [id number] DOB: March 29, 1961  Today's TOC FU Call Status: Today's TOC FU Call Status:: Successful TOC FU Call Competed TOC FU Call Complete Date: 10/06/22  Transition Care Management Follow-up Telephone Call Date of Discharge: 10/04/22 Discharge Facility: [facility]) Type of Discharge: Emergency Department Reason for ED Visit: Orthopedic Conditions Orthopedic/Injury Diagnosis:  (broke toes on left foot) How have you been since you were released from the hospital?: Better Any questions or concerns?: No  Items Reviewed: Did you receive and understand the discharge instructions provided?: Yes Medications obtained,verified, and reconciled?: Yes (Medications Reviewed) Any new allergies since your discharge?: No Dietary orders reviewed?: NA Do you have support at home?: Yes People in Home: spouse  Medications Reviewed Today: Medications Reviewed Today     Reviewed by [name], RMA (Registered Medical Assistant) on 10/06/22 at 1523  Med List Status: <None>   Medication Order Taking? Sig Documenting Provider Last Dose Status Informant  Accu-Chek Softclix Lancets lancets [id number] Yes Use as instructed [name], NP Taking Active   acetaminophen (TYLENOL) 500 MG tablet [id number] Yes Take 500 mg by mouth every 6 (six) hours as needed. [provider] Taking Active            Med Note [name]   Wed Apr 22, 2022  8:54 AM) Prn   albuterol (VENTOLIN HFA) 108 (90 Base) MCG/ACT inhaler [id number] Yes Inhale 2 puffs into the lungs every 6 (six) hours as needed. [name], NP Taking Active   blood glucose meter kit and supplies KIT [id number] Yes Use up to four times daily as directed. [name], NP Taking Active   celecoxib (CELEBREX) 200 MG capsule [id number]  Take 1 capsule (200 mg total) by mouth 2 (two) times daily. Glyn Ade, MD  Active   Continuous Blood Gluc Receiver (FREESTYLE LIBRE 2 READER) DEVI  [id number] No Use to check glucose continuously  Patient not taking: Reported on 07/24/2022   [name], NP Not Taking Active   Continuous Blood Gluc Sensor (FREESTYLE LIBRE 2 SENSOR) MISC [id number] No Apply a new sensor every 14 days. Use to check glucose continuously  Patient not taking: Reported on 07/24/2022   [name], NP Not Taking Active   cyclobenzaprine (FLEXERIL) 10 MG tablet [id number] Yes Take 1 tablet (10 mg total) by mouth 2 (two) times daily as needed for muscle spasms. Fayrene Helper, PA-C Taking Active   diazepam (VALIUM) 5 MG tablet [id number] No Take 1 tablet (5 mg total) by mouth every 12 (twelve) hours as needed for anxiety.  Patient not taking: Reported on 07/24/2022   Cammy Copa, MD Not Taking Active   docusate sodium (COLACE) 100 MG capsule [id number] No Take 1 capsule (100 mg total) by mouth 2 (two) times daily.  Patient not taking: Reported on 07/24/2022   [name], NP Not Taking Active   Elastic Bandages & Supports (KNEE BRACE ADJUSTABLE HINGED) MISC [id number] Yes 1 application by Does not apply route daily. Barbette Merino, NP Taking Active   FLUoxetine (PROZAC) 10 MG capsule [id number]  Take 1 capsule (10 mg total) by mouth daily. [name], NP  Active   glucose blood (ACCU-CHEK GUIDE) test strip [id number] No Use as instructed  Patient not taking: Reported on 07/24/2022   [name], NP Not Taking Active   hydrochlorothiazide (HYDRODIURIL) 25 MG tablet [id number] Yes Take 1 tablet (25 mg total) by mouth daily. [name],  NP Taking Active   hydrocortisone (ANUSOL-HC) 25 MG suppository [id number] No Place 1 suppository (25 mg total) rectally 2 (two) times daily.  Patient not taking: Reported on 09/11/2022   [name], NP Not Taking Active   hydrOXYzine (VISTARIL) 25 MG capsule [id number] No Take 1 capsule (25 mg total) by mouth every 8 (eight) hours as needed.  Patient not taking: Reported on 10/06/2022   [name], NP  Not Taking Active   ibuprofen (ADVIL) 600 MG tablet [id number] No Take 1 tablet (600 mg total) by mouth every 6 (six) hours as needed.  Patient not taking: Reported on 09/11/2022   Fayrene Helper, PA-C Not Taking Active            Med Note Sherilyn Cooter, [facility]   Fri Jul 24, 2022  1:17 PM) Prn   insulin glargine (LANTUS) 100 UNIT/ML injection [id number] Yes Inject 0.34 mLs (34 Units total) into the skin daily. [name], NP Taking Active   Insulin Syringe-Needle U-100 (INSULIN SYRINGE .5CC/31GX5/16") 31G X 5/16" 0.5 ML MISC [id number] Yes Use to inject insulin daily [name], NP Taking Active   losartan (COZAAR) 25 MG tablet [id number] Yes Take 1 tablet (25 mg total) by mouth daily. [name], NP Taking Active   metFORMIN (GLUCOPHAGE-XR) 500 MG 24 hr tablet [id number] Yes Take 2 tablets (1,000 mg total) by mouth 2 (two) times daily with a meal. [name], NP Taking Active   methocarbamol (ROBAXIN) 500 MG tablet [id number] Yes Take 1 tablet (500 mg total) by mouth every 8 (eight) hours as needed for muscle spasms. Cammy Copa, MD Taking Active   metoprolol succinate (TOPROL-XL) 50 MG 24 hr tablet [id number] Yes Take 1 tablet (50 mg total) by mouth daily. Take with or immediately following a meal. [name], NP Taking Active   omeprazole (PRILOSEC) 20 MG capsule [id number] Yes Take 1 capsule (20 mg total) by mouth daily. [name], NP Taking Active   ondansetron [facility]) 4 MG tablet [id number] Yes Take 1 tablet (4 mg total) by mouth every 8 (eight) hours as needed for nausea or vomiting. Donell Beers, FNP Taking Active            Med Note [name]   Wed Apr 22, 2022  8:57 AM) prn  rosuvastatin (CRESTOR) 40 MG tablet [id number] Yes Take 1 tablet (40 mg total) by mouth daily. [name], NP Taking Active             Home Care and Equipment/Supplies: Were Home Health Services Ordered?: NA Any new equipment or medical supplies ordered?:  NA  Functional Questionnaire: Do you need assistance with bathing/showering or dressing?: No Do you need assistance with meal preparation?: No Do you need assistance with eating?: No Do you have difficulty maintaining continence: No Do you need assistance with getting out of bed/getting out of a chair/moving?: No Do you have difficulty managing or taking your medications?: No  Follow up appointments reviewed: PCP Follow-up appointment confirmed?: NA Specialist Hospital Follow-up appointment confirmed?: Yes (has ortho doctor) Follow-Up Specialty Provider:: pt will call to schedule wih her ortho Do you need transportation to your follow-up appointment?: No Do you understand care options if your condition(s) worsen?: Yes-patient verbalized understanding    SIGNATURE [name] RMA

## 2022-10-07 ENCOUNTER — Telehealth: Payer: Self-pay | Admitting: Surgical

## 2022-10-07 NOTE — Telephone Encounter (Signed)
I called patient.  We discussed her symptoms that she is still having.  She is still having radicular left arm pain that extends from her neck down to her left hand and wrist.  This pain is associated with burning quality and associated with neck range of motion.  When she looks to her left and rotates her neck, it reproduces her left arm radicular symptoms reliably.  She has had no relief of symptoms with pretty much the same symptoms since her last office visit 2 months ago on 4/26.  She has weakness with trying to lift things with this arm as well.  She also has medial border scapular pain which is fairly consistent with cervical spine pathology.  Unclear if there is mostly cervical spine pathology versus neurologic pathology with her family history of MS as the primary cause of all the symptoms or could be combination of both.  Will plan to further evaluate with the planned MRI of the brain on Friday and she needs MRI cervical spine on Friday as well.

## 2022-10-09 ENCOUNTER — Inpatient Hospital Stay: Admission: RE | Admit: 2022-10-09 | Payer: Medicaid Other | Source: Ambulatory Visit

## 2022-10-09 ENCOUNTER — Other Ambulatory Visit: Payer: Self-pay

## 2022-10-13 ENCOUNTER — Encounter: Payer: Self-pay | Admitting: Clinical

## 2022-10-13 NOTE — Progress Notes (Unsigned)
[facility] Progress Note  10/13/2022 Name: Wanda Weaver MRN: [id number] DOB: 11/27/1960 Wanda Weaver is a 62 y.o. year old female who sees [name], NP for primary care. LCSW was initially consulted to assess the patient's needs and assit with community resources.  Interpreter: No.   Interpreter Name & Language: none  Assessment: Patient experiencing financial difficulties due to low income.  Ongoing Intervention: Patient approved for rental assistance from the [facility]. CSW delivered check to patient's apartment leasing office.  Abigail Butts, LCSW [facility] [phone]

## 2022-10-16 ENCOUNTER — Encounter: Payer: Self-pay | Admitting: Nurse Practitioner

## 2022-10-16 ENCOUNTER — Ambulatory Visit (INDEPENDENT_AMBULATORY_CARE_PROVIDER_SITE_OTHER): Payer: Medicaid Other | Admitting: Nurse Practitioner

## 2022-10-16 VITALS — BP 145/63 | HR 84 | Ht 62.0 in | Wt 201.0 lb

## 2022-10-16 DIAGNOSIS — R55 Syncope and collapse: Secondary | ICD-10-CM | POA: Diagnosis not present

## 2022-10-16 NOTE — Patient Instructions (Signed)
1. Near syncope  - CBC - Comprehensive metabolic panel   Follow up:  Follow up in 3 months

## 2022-10-16 NOTE — Progress Notes (Unsigned)
@Patient  ID: Wanda Weaver, female    DOB: Dec 11, 1960, 62 y.o.   MRN: [id number]  Chief Complaint  Patient presents with   Follow-up    Referring provider: [name], NP   HPI  Wanda Weaver is a 62 y.o. female who presents for follow-up of Type 2 diabetes mellitus. Has a past medical history of Bilateral lower extremity edema (07/2019), Bilateral lower extremity pain (07/2019), Diabetes mellitus without complication (HCC), Hemoglobin A1C greater than 9%, indicating poor diabetic control, Hyperlipidemia, Hypertension, and Vitamin D deficiency.    Patient presents today for follow-up visit.  She was seen in May for syncopal episodes.  She states that she has still had a few of these episodes since her last visit here.  We have referred her to cardiology and neurology.  She does have those appointments scheduled.  She does have a new job at [employer].  She states that is physically demanding and she does not know if she will be able to keep up the work there.  We will send a note for restrictions. Denies f/c/s, n/v/d, hemoptysis, PND, leg swelling Denies chest pain or edema       No Known Allergies  Immunization History  Administered Date(s) Administered   Pneumococcal Polysaccharide-23 08/06/2017    Past Medical History:  Diagnosis Date   Bilateral lower extremity edema 07/2019   Bilateral lower extremity pain 07/2019   Diabetes mellitus without complication (HCC)    Hemoglobin A1C greater than 9%, indicating poor diabetic control    Hyperlipidemia    Hypertension    Vitamin D deficiency     Tobacco History: Social History   Tobacco Use  Smoking Status Never  Smokeless Tobacco Never   Counseling given: Not Answered   Outpatient Encounter Medications as of 10/16/2022  Medication Sig   Accu-Chek Softclix Lancets lancets Use as instructed   acetaminophen (TYLENOL) 500 MG tablet Take 500 mg by mouth every 6 (six) hours as needed.   albuterol (VENTOLIN HFA) 108 (90  Base) MCG/ACT inhaler Inhale 2 puffs into the lungs every 6 (six) hours as needed.   blood glucose meter kit and supplies KIT Use up to four times daily as directed.   celecoxib (CELEBREX) 200 MG capsule Take 1 capsule (200 mg total) by mouth 2 (two) times daily.   Continuous Blood Gluc Receiver (FREESTYLE LIBRE 2 READER) DEVI Use to check glucose continuously   Continuous Blood Gluc Sensor (FREESTYLE LIBRE 2 SENSOR) MISC Apply a new sensor every 14 days. Use to check glucose continuously   cyclobenzaprine (FLEXERIL) 10 MG tablet Take 1 tablet (10 mg total) by mouth 2 (two) times daily as needed for muscle spasms.   diazepam (VALIUM) 5 MG tablet Take 1 tablet (5 mg total) by mouth every 12 (twelve) hours as needed for anxiety.   docusate sodium (COLACE) 100 MG capsule Take 1 capsule (100 mg total) by mouth 2 (two) times daily.   Elastic Bandages & Supports (KNEE BRACE ADJUSTABLE HINGED) MISC 1 application by Does not apply route daily.   FLUoxetine (PROZAC) 10 MG capsule Take 1 capsule (10 mg total) by mouth daily.   glucose blood (ACCU-CHEK GUIDE) test strip Use as instructed   hydrochlorothiazide (HYDRODIURIL) 25 MG tablet Take 1 tablet (25 mg total) by mouth daily.   hydrocortisone (ANUSOL-HC) 25 MG suppository Place 1 suppository (25 mg total) rectally 2 (two) times daily.   hydrOXYzine (VISTARIL) 25 MG capsule Take 1 capsule (25 mg total) by mouth every 8 (  eight) hours as needed.   ibuprofen (ADVIL) 600 MG tablet Take 1 tablet (600 mg total) by mouth every 6 (six) hours as needed.   insulin glargine (LANTUS) 100 UNIT/ML injection Inject 0.34 mLs (34 Units total) into the skin daily.   Insulin Syringe-Needle U-100 (INSULIN SYRINGE .5CC/31GX5/16") 31G X 5/16" 0.5 ML MISC Use to inject insulin daily   losartan (COZAAR) 25 MG tablet Take 1 tablet (25 mg total) by mouth daily.   metFORMIN (GLUCOPHAGE-XR) 500 MG 24 hr tablet Take 2 tablets (1,000 mg total) by mouth 2 (two) times daily with a meal.    methocarbamol (ROBAXIN) 500 MG tablet Take 1 tablet (500 mg total) by mouth every 8 (eight) hours as needed for muscle spasms.   metoprolol succinate (TOPROL-XL) 50 MG 24 hr tablet Take 1 tablet (50 mg total) by mouth daily. Take with or immediately following a meal.   omeprazole (PRILOSEC) 20 MG capsule Take 1 capsule (20 mg total) by mouth daily.   ondansetron (ZOFRAN) 4 MG tablet Take 1 tablet (4 mg total) by mouth every 8 (eight) hours as needed for nausea or vomiting.   rosuvastatin (CRESTOR) 40 MG tablet Take 1 tablet (40 mg total) by mouth daily.   No facility-administered encounter medications on file as of 10/16/2022.     Review of Systems  Review of Systems  Constitutional: Negative.   HENT: Negative.    Cardiovascular: Negative.   Gastrointestinal: Negative.   Allergic/Immunologic: Negative.   Neurological: Negative.   Psychiatric/Behavioral: Negative.         Physical Exam  BP (!) 145/63   Pulse 84   Ht 5\' 2"  (1.575 m)   Wt 201 lb (91.2 kg)   SpO2 98%   BMI 36.76 kg/m   Wt Readings from Last 5 Encounters:  10/16/22 201 lb (91.2 kg)  10/04/22 196 lb 3.4 oz (89 kg)  09/11/22 197 lb (89.4 kg)  07/24/22 200 lb (90.7 kg)  07/06/22 200 lb 9.9 oz (91 kg)     Physical Exam Vitals and nursing note reviewed.  Constitutional:      General: She is not in acute distress.    Appearance: She is well-developed.  Cardiovascular:     Rate and Rhythm: Normal rate and regular rhythm.  Pulmonary:     Effort: Pulmonary effort is normal.     Breath sounds: Normal breath sounds.  Neurological:     Mental Status: She is alert and oriented to person, place, and time.      Lab Results:  CBC    Component Value Date/Time   WBC 5.1 10/16/2022 1544   WBC 6.0 11/14/2021 1041   RBC 4.06 10/16/2022 1544   RBC 4.00 11/14/2021 1041   HGB 11.0 (L) 10/16/2022 1544   HCT 33.6 (L) 10/16/2022 1544   PLT 221 10/16/2022 1544   MCV 83 10/16/2022 1544   MCH 27.1 10/16/2022  1544   MCH 27.5 11/14/2021 1041   MCHC 32.7 10/16/2022 1544   MCHC 32.3 11/14/2021 1041   RDW 13.3 10/16/2022 1544   LYMPHSABS 1.7 07/17/2020 1108   EOSABS 0.6 (H) 07/17/2020 1108   BASOSABS 0.1 07/17/2020 1108    BMET    Component Value Date/Time   NA 135 10/16/2022 1544   K 4.2 10/16/2022 1544   CL 98 10/16/2022 1544   CO2 24 10/16/2022 1544   GLUCOSE 146 (H) 10/16/2022 1544   GLUCOSE 112 (H) 11/14/2021 1041   BUN 21 10/16/2022 1544   CREATININE 1.29 (  H) 10/16/2022 1544   CALCIUM 9.5 10/16/2022 1544   GFRNONAA >60 11/14/2021 1041   GFRAA 98 06/30/2019 1544    BNP No results found for: "BNP"  ProBNP No results found for: "PROBNP"  Imaging: DG Foot Complete Left  Result Date: 10/04/2022 CLINICAL DATA:  Left foot pain after injury 1 day ago EXAM: LEFT FOOT - COMPLETE 3+ VIEW COMPARISON:  11/26/2015 FINDINGS: There is no evidence of fracture or dislocation. There is no evidence of arthropathy or other focal bone abnormality. Mild generalized soft tissue swelling of the forefoot. Prominent atherosclerotic vascular calcifications. IMPRESSION: Negative. Electronically Signed   By: Duanne Guess D.O.   On: 10/04/2022 18:26     Assessment & Plan:   Near syncope - CBC - Comprehensive metabolic panel   Follow up:  Follow up in 3 months     [name], NP 10/21/2022

## 2022-10-17 LAB — COMPREHENSIVE METABOLIC PANEL
ALT: 14 IU/L (ref 0–32)
AST: 17 IU/L (ref 0–40)
Albumin: 4 g/dL (ref 3.9–4.9)
Alkaline Phosphatase: 81 IU/L (ref 44–121)
BUN/Creatinine Ratio: 16 (ref 12–28)
BUN: 21 mg/dL (ref 8–27)
Bilirubin Total: 0.5 mg/dL (ref 0.0–1.2)
CO2: 24 mmol/L (ref 20–29)
Calcium: 9.5 mg/dL (ref 8.7–10.3)
Chloride: 98 mmol/L (ref 96–106)
Creatinine, Ser: 1.29 mg/dL — ABNORMAL HIGH (ref 0.57–1.00)
Globulin, Total: 2.5 g/dL (ref 1.5–4.5)
Glucose: 146 mg/dL — ABNORMAL HIGH (ref 70–99)
Potassium: 4.2 mmol/L (ref 3.5–5.2)
Sodium: 135 mmol/L (ref 134–144)
Total Protein: 6.5 g/dL (ref 6.0–8.5)
eGFR: 47 mL/min/{1.73_m2} — ABNORMAL LOW (ref >59)

## 2022-10-17 LAB — CBC
Hematocrit: 33.6 % — ABNORMAL LOW (ref 34.0–46.6)
Hemoglobin: 11 g/dL — ABNORMAL LOW (ref 11.1–15.9)
MCH: 27.1 pg (ref 26.6–33.0)
MCHC: 32.7 g/dL (ref 31.5–35.7)
MCV: 83 fL (ref 79–97)
Platelets: 221 10*3/uL (ref 150–450)
RBC: 4.06 x10E6/uL (ref 3.77–5.28)
RDW: 13.3 % (ref 11.7–15.4)
WBC: 5.1 10*3/uL (ref 3.4–10.8)

## 2022-10-21 NOTE — Assessment & Plan Note (Signed)
-   CBC - Comprehensive metabolic panel   Follow up:  Follow up in 3 months 

## 2022-10-22 ENCOUNTER — Ambulatory Visit: Payer: Medicaid Other | Admitting: Neurology

## 2022-10-22 ENCOUNTER — Encounter: Payer: Self-pay | Admitting: Neurology

## 2022-10-22 VITALS — BP 162/85 | HR 80 | Ht 62.0 in | Wt 200.0 lb

## 2022-10-22 DIAGNOSIS — R269 Unspecified abnormalities of gait and mobility: Secondary | ICD-10-CM

## 2022-10-22 DIAGNOSIS — R202 Paresthesia of skin: Secondary | ICD-10-CM

## 2022-10-22 NOTE — Progress Notes (Signed)
Chief Complaint  Patient presents with   New Patient (Initial Visit)    Rm 15. Accompanied by boyfriend. NP internal referral for Generalized weakness/pain, loss of taste and dizziness-wanting to r/o MS, she reports vision changes (black dots, double vision). 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 She c/o muscle stiffness, difficulty standing. Requesting open MRI.      ASSESSMENT AND PLAN  Wanda Weaver is a 62 y.o. female   Left shoulder pain, weakness,  Most related to her left shoulder pathology,  Under the care of orthopedic surgeon, Bilateral lower extremity stiffness, paresthesia,  Evidence of length-dependent sensory changes,  Proceed with EMG nerve conduction study for peripheral neuropathy  DIAGNOSTIC DATA (LABS, IMAGING, TESTING) - I reviewed patient records, labs, notes, testing and imaging myself where available.   MEDICAL HISTORY:  Wanda Weaver, is a 62 year old female, accompanied by her husband seen in request by her primary care nurse practitioner Angus Seller for evaluation of left shoulder pain, weakness, right leg paresthesia gait abnormality, initial evaluation October 22, 2022    I reviewed and summarized the referring note. PMHX. HTN HLD DM insulin dependent Depression Bilateral Carpal tunnel release surgery. Left frozen shoulder,   She used to work at [employer], reported great decline of her health since she suffered COVID, most recent one reported was in spring 2023,  Since then, she began to notice bilateral lower extremity stiffness, right worse than left, especially after prolonged standing, she felt right leg short leg numbness," hard like a break", in addition, she began to notice worsening left shoulder issues, is under the care of orthopedic surgeon, was diagnosed with frozen shoulder, limited range of motion,  She is no longer able to continue her job at the [employer],  Personally reviewed x-ray August 07, 2022, multilevel  degenerative changes most noticeable at C6-7, MRI of the brain and cervical spine was ordered by Dr. August Saucer, was not able to carry through due to claustrophobia, will reschedule in open MRI   PHYSICAL EXAM:   Vitals:   10/22/22 1539  BP: (!) 162/85  Pulse: 80  Weight: 200 lb (90.7 kg)  Height: 5\' 2"  (1.575 m)   Body mass index is 36.58 kg/m.  PHYSICAL EXAMNIATION:  Gen: NAD, conversant, well nourised, well groomed                     Cardiovascular: Regular rate rhythm, no peripheral edema, warm, nontender. Eyes: Conjunctivae clear without exudates or hemorrhage Neck: Supple, no carotid bruits. Pulmonary: Clear to auscultation bilaterally   NEUROLOGICAL EXAM:  MENTAL STATUS: Speech/cognition: Awake, alert, oriented to history taking and casual conversation CRANIAL NERVES: CN II: Visual fields are full to confrontation. Pupils are round equal and briskly reactive to light. CN III, IV, VI: extraocular movement are normal. No ptosis. CN V: Facial sensation is intact to light touch CN VII: Face is symmetric with normal eye closure  CN VIII: Hearing is normal to causal conversation. CN IX, X: Phonation is normal. CN XI: Head turning and shoulder shrug are intact  MOTOR: Limited range of motion of left shoulder, variable effort,  there was no significant weakness at right upper extremity and bilateral lower extremity  REFLEXES: Reflexes are 1 and symmetric at the biceps, triceps, knees, and ankles. Plantar responses are flexor.  SENSORY: Length-dependent decreased light touch, pinprick to mid shin level  COORDINATION: There is no trunk or limb dysmetria noted.  GAIT/STANCE: Need push-up to get up from seated  position, cautious,  REVIEW OF SYSTEMS:  Full 14 system review of systems performed and notable only for as above All other review of systems were negative.   ALLERGIES: Allergies  Allergen Reactions   Ibuprofen Palpitations    HOME MEDICATIONS: Current  Outpatient Medications  Medication Sig Dispense Refill   Accu-Chek Softclix Lancets lancets Use as instructed 100 each 12   acetaminophen (TYLENOL) 500 MG tablet Take 500 mg by mouth every 6 (six) hours as needed.     albuterol (VENTOLIN HFA) 108 (90 Base) MCG/ACT inhaler Inhale 2 puffs into the lungs every 6 (six) hours as needed. 18 g 2   blood glucose meter kit and supplies KIT Use up to four times daily as directed. 1 each 0   celecoxib (CELEBREX) 200 MG capsule Take 1 capsule (200 mg total) by mouth 2 (two) times daily. 20 capsule 0   Continuous Blood Gluc Receiver (FREESTYLE LIBRE 2 READER) DEVI Use to check glucose continuously 1 each 0   Continuous Blood Gluc Sensor (FREESTYLE LIBRE 2 SENSOR) MISC Apply a new sensor every 14 days. Use to check glucose continuously 6 each 3   cyclobenzaprine (FLEXERIL) 10 MG tablet Take 1 tablet (10 mg total) by mouth 2 (two) times daily as needed for muscle spasms. 20 tablet 0   diazepam (VALIUM) 5 MG tablet Take 1 tablet (5 mg total) by mouth every 12 (twelve) hours as needed for anxiety. 6 tablet 0   docusate sodium (COLACE) 100 MG capsule Take 1 capsule (100 mg total) by mouth 2 (two) times daily. 10 capsule 0   Elastic Bandages & Supports (KNEE BRACE ADJUSTABLE HINGED) MISC 1 application by Does not apply route daily. 1 each 0   FLUoxetine (PROZAC) 10 MG capsule Take 1 capsule (10 mg total) by mouth daily. 30 capsule 2   glucose blood (ACCU-CHEK GUIDE) test strip Use as instructed 100 each 12   hydrochlorothiazide (HYDRODIURIL) 25 MG tablet Take 1 tablet (25 mg total) by mouth daily. 90 tablet 1   hydrOXYzine (VISTARIL) 25 MG capsule Take 1 capsule (25 mg total) by mouth every 8 (eight) hours as needed. 30 capsule 0   insulin glargine (LANTUS) 100 UNIT/ML injection Inject 0.34 mLs (34 Units total) into the skin daily. 10 mL 11   Insulin Syringe-Needle U-100 (INSULIN SYRINGE .5CC/31GX5/16") 31G X 5/16" 0.5 ML MISC Use to inject insulin daily 100 each 2    losartan (COZAAR) 25 MG tablet Take 1 tablet (25 mg total) by mouth daily. 90 tablet 0   metFORMIN (GLUCOPHAGE-XR) 500 MG 24 hr tablet Take 2 tablets (1,000 mg total) by mouth 2 (two) times daily with a meal. 360 tablet 1   methocarbamol (ROBAXIN) 500 MG tablet Take 1 tablet (500 mg total) by mouth every 8 (eight) hours as needed for muscle spasms. 30 tablet 0   metoprolol succinate (TOPROL-XL) 50 MG 24 hr tablet Take 1 tablet (50 mg total) by mouth daily. Take with or immediately following a meal. 90 tablet 3   omeprazole (PRILOSEC) 20 MG capsule Take 1 capsule (20 mg total) by mouth daily. 30 capsule 3   ondansetron (ZOFRAN) 4 MG tablet Take 1 tablet (4 mg total) by mouth every 8 (eight) hours as needed for nausea or vomiting. 20 tablet 0   rosuvastatin (CRESTOR) 40 MG tablet Take 1 tablet (40 mg total) by mouth daily. 90 tablet 1   No current facility-administered medications for this visit.    PAST MEDICAL HISTORY: Past Medical History:  Diagnosis Date   Bilateral lower extremity edema 07/2019   Bilateral lower extremity pain 07/2019   Diabetes mellitus without complication (HCC)    Hemoglobin A1C greater than 9%, indicating poor diabetic control    Hyperlipidemia    Hypertension    Vitamin D deficiency     PAST SURGICAL HISTORY: Past Surgical History:  Procedure Laterality Date   APPENDECTOMY     CARPAL TUNNEL RELEASE Bilateral    CHOLECYSTECTOMY     WRIST SURGERY Bilateral     FAMILY HISTORY: Family History  Problem Relation Age of Onset   Diabetes Mother    Dementia Mother    Diabetes Father    Hypertension Father     SOCIAL HISTORY: Social History   Socioeconomic History   Marital status: Single    Spouse name: Not on file   Number of children: Not on file   Years of education: Not on file   Highest education level: Not on file  Occupational History   Not on file  Tobacco Use   Smoking status: Never   Smokeless tobacco: Never  Vaping Use   Vaping  status: Never Used  Substance and Sexual Activity   Alcohol use: No   Drug use: No   Sexual activity: Yes    Partners: Male  Other Topics Concern   Not on file  Social History Narrative   Not on file   Social Determinants of Health   Financial Resource Strain: High Risk (07/30/2022)   Overall Financial Resource Strain (CARDIA)    Difficulty of Paying Living Expenses: Very hard  Food Insecurity: Food Insecurity Present (07/30/2022)   Hunger Vital Sign    Worried About Running Out of Food in the Last Year: Often true    Ran Out of Food in the Last Year: Often true  Transportation Needs: Unmet Transportation Needs (07/30/2022)   PRAPARE - Administrator, Civil Service (Medical): Yes    Lack of Transportation (Non-Medical): No  Physical Activity: Not on file  Stress: Not on file  Social Connections: Not on file  Intimate Partner Violence: Not on file      Levert Feinstein, M.D. Ph.D.  [facility] [address] [place], [place] [address] Ph: [phone] Fax: [phone]  CC:  [name], NP [phone] [address] [place],  [place] [address]  [name], NP

## 2022-10-27 ENCOUNTER — Telehealth: Payer: Self-pay | Admitting: Clinical

## 2022-10-27 NOTE — Telephone Encounter (Signed)
Integrated Behavioral Health Progress Note  10/27/2022 Name: Wanda Weaver MRN: [id number] DOB: [birth year] [name] is a 62 y.o. year old female who sees [name], NP for primary care. LCSW was initially consulted to assess the patient's needs and assit with community resources.   Interpreter: No.   Interpreter Name & Language: none  Assessment: Patient experiencing financial difficulties due to low income and health concerns.  Ongoing Intervention: Patient called CSW and reported that she and her partner are in need of rental assistance again this month. They can cover part of the rent with her partner's income. She has not been able to maintain working due to health concerns. She has a pending disability application. CSW coordinating with Kathreen Cornfield to request assistance again for rent this month.  SDOH (Social Determinants of Health) assessments performed:   SDOH Screenings   Food Insecurity: Food Insecurity Present (07/30/2022)  Housing: Medium Risk (10/27/2022)  Transportation Needs: Unmet Transportation Needs (07/30/2022)  Depression (PHQ2-9): Low Risk  (04/22/2022)  Financial Resource Strain: High Risk (07/30/2022)  Tobacco Use: Low Risk  (10/22/2022)    Abigail Butts, LCSW [facility] [phone]

## 2022-10-28 ENCOUNTER — Telehealth: Payer: Self-pay | Admitting: Orthopedic Surgery

## 2022-10-28 ENCOUNTER — Ambulatory Visit: Payer: Medicaid Other | Admitting: Surgical

## 2022-10-28 NOTE — Telephone Encounter (Signed)
Pt came in today for appt fro MRI review but she needs to have a open MRI just Valium does not work please advise

## 2022-10-30 ENCOUNTER — Telehealth: Payer: Self-pay | Admitting: Clinical

## 2022-10-30 NOTE — Telephone Encounter (Signed)
Integrated Behavioral Health Progress Note  10/30/2022 Name: Wanda Weaver MRN: [id number] DOB: 07/22/1960 Wanda Weaver is a 62 y.o. year old female who sees [name], NP for primary care. LCSW was initially consulted to assess the patient's needs and assit with community resources.   Interpreter: No.   Interpreter Name & Language: none  Assessment: Patient experiencing financial difficulties due to low income and health concerns.  Ongoing Intervention: Patient was approved for rental assistance through the [facility]. CSW called patient and advised her of the approval. CSW to coordinate delivery of check to patient's landlord.   Abigail Butts, LCSW [facility] [phone]

## 2022-11-03 ENCOUNTER — Other Ambulatory Visit: Payer: Self-pay

## 2022-11-04 ENCOUNTER — Other Ambulatory Visit: Payer: Self-pay

## 2022-11-05 ENCOUNTER — Telehealth: Payer: Self-pay | Admitting: Clinical

## 2022-11-05 NOTE — Telephone Encounter (Signed)
Integrated Behavioral Health Progress Note  11/05/2022 Name: Wanda Weaver MRN: [id number] DOB: April 12, 1961 Wanda Weaver is a 62 y.o. year old female who sees [name], NP for primary care. LCSW was initially consulted to assess the patient's needs and assit with community resources.   Interpreter: No.   Interpreter Name & Language: none  Assessment: Patient experiencing financial difficulties due to low income and health concerns.  Ongoing Intervention: Delivered rent assistance to patient's apartment property management today (rent assistance from [facility]). Called patient and advised that the payment was made. Patient and her partner are paying the remaining balance on the rent.   Abigail Butts, LCSW [facility] [phone]

## 2022-11-06 ENCOUNTER — Other Ambulatory Visit: Payer: Self-pay

## 2022-11-06 NOTE — Telephone Encounter (Signed)
Referral has been faxed/emailed to [facility] at [facility]. She will contact pt to scehdule appt

## 2022-11-12 ENCOUNTER — Other Ambulatory Visit: Payer: Self-pay

## 2022-11-13 ENCOUNTER — Ambulatory Visit: Payer: Medicaid Other | Admitting: Cardiology

## 2022-11-16 ENCOUNTER — Telehealth: Payer: Self-pay | Admitting: Clinical

## 2022-11-16 NOTE — Telephone Encounter (Signed)
Integrated Behavioral Health Progress Note  11/16/2022 Name: Allexis Gelhaus MRN: [id number] DOB: [birth year] Wanda Weaver is a 62 y.o. year old female who sees [name], NP for primary care. LCSW was initially consulted to assess the patient's needs and assit with community resources.   Interpreter: No.   Interpreter Name & Language: none  Assessment: Patient experiencing financial difficulties due to low income and health concerns.  Ongoing Intervention: Patient called and requested assistance with her rent again for this month. CSW sent request to [facility]. Due to receiving assistance a couple times already, patient will meet her limit with assistance is approved this time. Unfortunately she and her partner have not been able to obtain assistance from other community agencies.   Abigail Butts, LCSW [facility] [phone]

## 2022-11-18 ENCOUNTER — Other Ambulatory Visit: Payer: Self-pay

## 2022-11-20 ENCOUNTER — Other Ambulatory Visit: Payer: Self-pay | Admitting: Nurse Practitioner

## 2022-11-20 ENCOUNTER — Other Ambulatory Visit: Payer: Self-pay

## 2022-11-20 ENCOUNTER — Other Ambulatory Visit (HOSPITAL_COMMUNITY): Payer: Self-pay

## 2022-11-20 DIAGNOSIS — E118 Type 2 diabetes mellitus with unspecified complications: Secondary | ICD-10-CM

## 2022-11-21 ENCOUNTER — Other Ambulatory Visit (HOSPITAL_COMMUNITY): Payer: Self-pay

## 2022-11-23 ENCOUNTER — Other Ambulatory Visit: Payer: Self-pay

## 2022-11-23 ENCOUNTER — Other Ambulatory Visit (HOSPITAL_COMMUNITY): Payer: Self-pay

## 2022-11-23 MED ORDER — METFORMIN HCL 500 MG PO TABS
1000.0000 mg | ORAL_TABLET | Freq: Two times a day (BID) | ORAL | 11 refills | Status: DC
Start: 2022-11-23 — End: 2023-02-09
  Filled 2022-11-23 (×2): qty 120, 30d supply, fill #0
  Filled 2022-12-27: qty 120, 30d supply, fill #1

## 2022-11-24 ENCOUNTER — Other Ambulatory Visit: Payer: Self-pay

## 2022-11-27 ENCOUNTER — Telehealth: Payer: Self-pay | Admitting: Clinical

## 2022-11-27 NOTE — Telephone Encounter (Signed)
Integrated Behavioral Health Progress Note  11/27/2022 Name: Nurah Boeger MRN: [id number] DOB: [birth year] Wanda Weaver is a 62 y.o. year old female who sees [name], NP for primary care. LCSW was initially consulted to assess the patient's needs and assit with community resources.   Interpreter: No.   Interpreter Name & Language: none  Assessment: Patient experiencing financial difficulties due to low income and health concerns.  Ongoing Intervention: Patient was approved for partial rental assistance again this month from the [employer]. Delivered payment to patient's property management yesterday and patient will be responsible for remainder of balance. Called patient today and left voice mail advising that the payment had been made.  Abigail Butts, LCSW [facility] [phone]

## 2022-11-30 ENCOUNTER — Ambulatory Visit: Payer: Medicaid Other | Admitting: Surgical

## 2022-11-30 DIAGNOSIS — M792 Neuralgia and neuritis, unspecified: Secondary | ICD-10-CM

## 2022-11-30 DIAGNOSIS — M7502 Adhesive capsulitis of left shoulder: Secondary | ICD-10-CM | POA: Diagnosis not present

## 2022-11-30 DIAGNOSIS — R590 Localized enlarged lymph nodes: Secondary | ICD-10-CM

## 2022-12-01 ENCOUNTER — Other Ambulatory Visit: Payer: Self-pay

## 2022-12-01 ENCOUNTER — Telehealth: Payer: Self-pay | Admitting: Neurology

## 2022-12-01 DIAGNOSIS — M25512 Pain in left shoulder: Secondary | ICD-10-CM

## 2022-12-01 DIAGNOSIS — M542 Cervicalgia: Secondary | ICD-10-CM

## 2022-12-01 NOTE — Telephone Encounter (Signed)
Pt said have MRI of brain disc that ordered by Orthopedic physical, Dr. Gregor Hams. He asking that Dr. Terrace Arabia read the MRI because he does truth his self to read it correctly. Would like to drop off the disc when come to Nerve Conduction appt. Would like a call back.

## 2022-12-01 NOTE — Telephone Encounter (Signed)
Patient agreeable to bring to September appointment to review with Dr. Terrace Arabia

## 2022-12-01 NOTE — Telephone Encounter (Signed)
I can review MRI of the brain film together with patient, she does not need to drop the disc at separate visit, please let her brain MRI disc and report upcoming visit in September

## 2022-12-03 ENCOUNTER — Other Ambulatory Visit: Payer: Self-pay | Admitting: Nurse Practitioner

## 2022-12-03 DIAGNOSIS — R9389 Abnormal findings on diagnostic imaging of other specified body structures: Secondary | ICD-10-CM

## 2022-12-03 DIAGNOSIS — R590 Localized enlarged lymph nodes: Secondary | ICD-10-CM

## 2022-12-03 NOTE — Progress Notes (Signed)
[facility] Telephone:([phone]) [phone]   [phone]  INITIAL CONSULTATION:  Patient Care Team: [name], NP as PCP - General (Pulmonary Disease) Alden Hipp, RPH-CPP (Pharmacist)  CHIEF COMPLAINTS/PURPOSE OF CONSULTATION:  "enlarged left supraclavicular chain lymph nodes "  HISTORY OF PRESENTING ILLNESS:  Wanda Weaver 62 y.o. female with medical history significant for type 2 diabetes, hypertension, osteoarthritis, anxiety, bilateral lower extremity edema  On review of the previous records patient is being evaluated by orthopedics for left shoulder pain and weakness. MRI of cervical spine was ordered by orthopedist Dr. August Saucer and performed on 12/02/22.  Result significant for mild supple mildly enlarged left supraclavicular chain lymph nodes with index lymph node measuring 1.6 x 1.2 cm. Reports includes "This is nonspecific but suspicious for metastatic disease." Patient is also being evaluated by neurologist for bilateral lower extremity weakness with plan for EMG nerve conduction study for peripheral neuropathy.   On exam today patient is accompanied by her daughter. Patient reports having left shoulder pain since 2019. She is currently being worked up for frozen shoulder  at [facility].  She does endorse pain on the left side of her neck that radiates to her elbow that has been present x several months.  She describes it as a pulling sensation and that it is mild.  She noticed that the left side of her neck looks swollen yesterday, unsure how long that has been ongoing.  Patient denies any weight loss, night sweats, fever or visibly swollen lymph nodes.  Patient is also concerned about her hemoglobin.  It has been downtrending over the last year.  She admits to history of iron deficiency anemia secondary to menorrhagia.  She for started taking iron supplements in 1985 and has been on them intermittently since then.  She has been  taking them inconsistently over the last year.  She denies any abnormal bleeding or easy bruising.  Denies any fatigue, chest pain or shortness of breath.  Further discussion reveals patient is a former smoker, quit in 1995 after 10 years of occasional smoking, unable to quantify and describes it as rare.  She does not drink alcohol.  Her last Pap smear was in 2022 and she had a colonoscopy for for the pandemic.  She recalls both of these were normal.  She has never had a mammogram although plans to have 1 after her shoulder pain resolves.  Oncologic family history includes father having skin cancer, paternal grandmother having ovarian cancer and 2 paternal uncles with stomach and brain cancer.  MEDICAL HISTORY:  Past Medical History:  Diagnosis Date   Bilateral lower extremity edema 07/2019   Bilateral lower extremity pain 07/2019   Diabetes mellitus without complication (HCC)    Hemoglobin A1C greater than 9%, indicating poor diabetic control    Hyperlipidemia    Hypertension    Vitamin D deficiency     SURGICAL HISTORY: Past Surgical History:  Procedure Laterality Date   APPENDECTOMY     CARPAL TUNNEL RELEASE Bilateral    CHOLECYSTECTOMY     WRIST SURGERY Bilateral     SOCIAL HISTORY: Social History   Socioeconomic History   Marital status: Single    Spouse name: Not on file   Number of children: Not on file   Years of education: Not on file   Highest education level: Not on file  Occupational History   Not on file  Tobacco Use   Smoking status: Never   Smokeless tobacco:  Never  Vaping Use   Vaping status: Never Used  Substance and Sexual Activity   Alcohol use: No   Drug use: No   Sexual activity: Yes    Partners: Male  Other Topics Concern   Not on file  Social History Narrative   Not on file   Social Determinants of Health   Financial Resource Strain: High Risk (07/30/2022)   Overall Financial Resource Strain (CARDIA)    Difficulty of Paying Living  Expenses: Very hard  Food Insecurity: Food Insecurity Present (07/30/2022)   Hunger Vital Sign    Worried About Running Out of Food in the Last Year: Often true    Ran Out of Food in the Last Year: Often true  Transportation Needs: Unmet Transportation Needs (07/30/2022)   PRAPARE - Administrator, Civil Service (Medical): Yes    Lack of Transportation (Non-Medical): No  Physical Activity: Not on file  Stress: Not on file  Social Connections: Unknown (11/06/2022)   Received from [facility]   Social Network    Social Network: Not on file  Intimate Partner Violence: Unknown (11/06/2022)   Received from [facility]   HITS    Physically Hurt: Not on file    Insult or Talk Down To: Not on file    Threaten Physical Harm: Not on file    Scream or Curse: Not on file    FAMILY HISTORY: Family History  Problem Relation Age of Onset   Diabetes Mother    Dementia Mother    Diabetes Father    Hypertension Father     ALLERGIES:  is allergic to ibuprofen.  MEDICATIONS:  Current Outpatient Medications  Medication Sig Dispense Refill   Accu-Chek Softclix Lancets lancets Use as instructed 100 each 12   acetaminophen (TYLENOL) 500 MG tablet Take 500 mg by mouth every 6 (six) hours as needed.     albuterol (VENTOLIN HFA) 108 (90 Base) MCG/ACT inhaler Inhale 2 puffs into the lungs every 6 (six) hours as needed. 18 g 2   blood glucose meter kit and supplies KIT Use up to four times daily as directed. 1 each 0   celecoxib (CELEBREX) 200 MG capsule Take 1 capsule (200 mg total) by mouth 2 (two) times daily. 20 capsule 0   Continuous Blood Gluc Receiver (FREESTYLE LIBRE 2 READER) DEVI Use to check glucose continuously 1 each 0   Continuous Blood Gluc Sensor (FREESTYLE LIBRE 2 SENSOR) MISC Apply a new sensor every 14 days. Use to check glucose continuously 6 each 3   cyclobenzaprine (FLEXERIL) 10 MG tablet Take 1 tablet (10 mg total) by mouth 2 (two) times daily as needed for muscle  spasms. 20 tablet 0   diazepam (VALIUM) 5 MG tablet Take 1 tablet (5 mg total) by mouth every 12 (twelve) hours as needed for anxiety. 6 tablet 0   docusate sodium (COLACE) 100 MG capsule Take 1 capsule (100 mg total) by mouth 2 (two) times daily. 10 capsule 0   Elastic Bandages & Supports (KNEE BRACE ADJUSTABLE HINGED) MISC 1 application by Does not apply route daily. 1 each 0   glucose blood (ACCU-CHEK GUIDE) test strip Use as instructed 100 each 12   hydrochlorothiazide (HYDRODIURIL) 25 MG tablet Take 1 tablet (25 mg total) by mouth daily. 90 tablet 1   hydrOXYzine (VISTARIL) 25 MG capsule Take 1 capsule (25 mg total) by mouth every 8 (eight) hours as needed. 30 capsule 0   insulin glargine (LANTUS) 100 UNIT/ML injection  Inject 0.34 mLs (34 Units total) into the skin daily. 10 mL 11   Insulin Syringe-Needle U-100 (INSULIN SYRINGE .5CC/31GX5/16") 31G X 5/16" 0.5 ML MISC Use to inject insulin daily 100 each 2   losartan (COZAAR) 25 MG tablet Take 1 tablet (25 mg total) by mouth daily. 90 tablet 0   metFORMIN (GLUCOPHAGE) 500 MG tablet Take 2 tablets (1,000 mg total) by mouth 2 (two) times daily with a meal. 120 tablet 11   methocarbamol (ROBAXIN) 500 MG tablet Take 1 tablet (500 mg total) by mouth every 8 (eight) hours as needed for muscle spasms. 30 tablet 0   metoprolol succinate (TOPROL-XL) 50 MG 24 hr tablet Take 1 tablet (50 mg total) by mouth daily. Take with or immediately following a meal. 90 tablet 3   omeprazole (PRILOSEC) 20 MG capsule Take 1 capsule (20 mg total) by mouth daily. 30 capsule 3   ondansetron (ZOFRAN) 4 MG tablet Take 1 tablet (4 mg total) by mouth every 8 (eight) hours as needed for nausea or vomiting. 20 tablet 0   rosuvastatin (CRESTOR) 40 MG tablet Take 1 tablet (40 mg total) by mouth daily. 90 tablet 1   No current facility-administered medications for this visit.    REVIEW OF SYSTEMS:   All other systems are reviewed and are negative for acute change except as  noted in the HPI.  PHYSICAL EXAMINATION: ECOG PERFORMANCE STATUS: 1 - Symptomatic but completely ambulatory  Vitals:   12/04/22 1113  BP: (!) 176/80  Pulse: 75  Resp: 14  Temp: 98.1 F (36.7 C)  SpO2: 100%   Filed Weights   12/04/22 1113  Weight: 205 lb (93 kg)    Physical Exam Vitals reviewed.  Constitutional:      Appearance: She is not ill-appearing or toxic-appearing.  HENT:     Head: Normocephalic.     Comments: Airway patent    Right Ear: External ear normal.     Left Ear: External ear normal.     Nose: Nose normal.     Mouth/Throat:     Mouth: Mucous membranes are moist.  Eyes:     General: No scleral icterus.    Conjunctiva/sclera: Conjunctivae normal.  Neck:     Comments: Left side of neck with diffuse mild edema. No tenderness. No overlying skin changes Cardiovascular:     Rate and Rhythm: Normal rate and regular rhythm.     Pulses: Normal pulses.     Heart sounds: Normal heart sounds.  Pulmonary:     Effort: Pulmonary effort is normal.  Abdominal:     General: There is no distension.  Musculoskeletal:     Cervical back: Normal range of motion. No tenderness.     Right lower leg: No edema.     Left lower leg: No edema.  Lymphadenopathy:     Head:     Right side of head: No submental, submandibular, preauricular, posterior auricular or occipital adenopathy.     Left side of head: No submental, submandibular, preauricular, posterior auricular or occipital adenopathy.     Cervical: No cervical adenopathy.     Upper Body:     Right upper body: No axillary adenopathy.     Left upper body: No axillary adenopathy.     Lower Body: No right inguinal adenopathy.  Skin:    General: Skin is warm.     Capillary Refill: Capillary refill takes less than 2 seconds.     Findings: No bruising.  Neurological:  Mental Status: She is alert.       LABORATORY DATA:  I have reviewed the data as listed    Latest Ref Rng & Units 12/04/2022   12:24 PM 10/16/2022     3:44 PM 09/14/2022    2:11 PM  CBC  WBC 4.0 - 10.5 K/uL 4.7  5.1  7.4   Hemoglobin 12.0 - 15.0 g/dL 09.8  11.9  14.7   Hematocrit 36.0 - 46.0 % 32.5  33.6  35.2   Platelets 150 - 400 K/uL 197  221  276        Latest Ref Rng & Units 12/04/2022   12:24 PM 10/16/2022    3:44 PM 09/14/2022    2:11 PM  CMP  Glucose 70 - 99 mg/dL 829  562  92   BUN 8 - 23 mg/dL 24  21  13    Creatinine 0.44 - 1.00 mg/dL 1.30  8.65  7.84   Sodium 135 - 145 mmol/L 139  135  142   Potassium 3.5 - 5.1 mmol/L 4.4  4.2  4.1   Chloride 98 - 111 mmol/L 104  98  108   CO2 22 - 32 mmol/L 28  24  22    Calcium 8.9 - 10.3 mg/dL 9.9  9.5  9.0   Total Protein 6.5 - 8.1 g/dL 7.1  6.5  6.9   Total Bilirubin 0.3 - 1.2 mg/dL 0.6  0.5  <6.9   Alkaline Phos 38 - 126 U/L 70  81  60   AST 15 - 41 U/L 18  17  16    ALT 0 - 44 U/L 17  14  19       RADIOGRAPHIC STUDIES: I have personally reviewed the radiological images as listed and agreed with the findings in the report. No results found.  ASSESSMENT & PLAN Wanda Weaver is a 62 y.o. female [facility] consultation regarding enlarged left supraclavicular chain lymph nodes seen on MRI of cervical spine. We have reviewed etiologies including malignancy, infectious process, inflammatory process. Patient will proceed with laboratory workup today.   #Lymphadenopathy -Exam with mild left neck edema, no palpable LAD. -Labs collected today include CBC, CMP, ESR, CRP, LDH, flow cytometry, Hep B & C serologies, HIV serology. - Patient does not have previous CT chest or MRI of cervical spine to determine chronicity. Will order CT CAP to evaluate for additional lymphadenopathy. Will consider biopsy pending CT results.  #Anemia -Chart review shows range of 10.6-11.0 x 1 year. Has history of iron deficiency anemia per HPI. No active bleeding. -Will check B12, folate, and iron studies.  #Age related screenings -Patient prefers to continue to follow with  PCP for these.  -Patient will RTC when work up is complete.  Patient expressed understanding of the recommended workup and is agreeable to move forward.   All questions were answered. The patient knows to call the clinic with any problems, questions or concerns.  Shared visit with Dr. Leonides Schanz.  Orders Placed This Encounter  Procedures   MR OUTSIDE FILMS SPINE    MRI cervical spine from [facility] on 11/27/22    Standing Status:   Future    Number of Occurrences:   1    Standing Expiration Date:   12/04/2023    Order Specific Question:   Where was this CD imported?    Answer:   [facility]   CT CHEST ABDOMEN PELVIS W CONTRAST    Standing Status:  Future    Standing Expiration Date:   12/04/2023    Order Specific Question:   If indicated for the ordered procedure, I authorize the administration of contrast media per Radiology protocol    Answer:   Yes    Order Specific Question:   Does the patient have a contrast media/X-ray dye allergy?    Answer:   No    Order Specific Question:   Preferred imaging location?    Answer:   [facility]    Order Specific Question:   If indicated for the ordered procedure, I authorize the administration of oral contrast media per Radiology protocol    Answer:   Yes   CBC with Differential (Cancer Center Only)    Standing Status:   Future    Number of Occurrences:   1    Standing Expiration Date:   12/04/2023   CMP (Cancer Center only)    Standing Status:   Future    Number of Occurrences:   1    Standing Expiration Date:   12/04/2023   Lactate dehydrogenase (LDH)    Standing Status:   Future    Number of Occurrences:   1    Standing Expiration Date:   12/04/2023   Flow Cytometry, Peripheral Blood (Oncology)    Standing Status:   Future    Number of Occurrences:   1    Standing Expiration Date:   12/04/2023   Sedimentation rate    Standing Status:   Future    Number of Occurrences:   1    Standing Expiration Date:   12/04/2023    C-reactive protein    Standing Status:   Future    Number of Occurrences:   1    Standing Expiration Date:   12/04/2023   HIV antibody (with reflex)    Standing Status:   Future    Number of Occurrences:   1    Standing Expiration Date:   12/04/2023   Hepatitis B core antibody, total    Standing Status:   Future    Number of Occurrences:   1    Standing Expiration Date:   12/04/2023   Hepatitis B surface antigen    Standing Status:   Future    Number of Occurrences:   1    Standing Expiration Date:   12/04/2023   Hepatitis B surface antibody    Standing Status:   Future    Number of Occurrences:   1    Standing Expiration Date:   12/04/2023   Hepatitis C antibody    Standing Status:   Future    Number of Occurrences:   1    Standing Expiration Date:   12/04/2023   Vitamin B12    Standing Status:   Future    Number of Occurrences:   1    Standing Expiration Date:   12/04/2023   Folate, Serum    Standing Status:   Future    Number of Occurrences:   1    Standing Expiration Date:   12/04/2023   Iron and Iron Binding Capacity ([facility] only)    Standing Status:   Future    Number of Occurrences:   1    Standing Expiration Date:   12/04/2023   Ferritin    Standing Status:   Future    Number of Occurrences:   1    Standing Expiration Date:   12/04/2023    I have spent a total  of 60 minutes minutes of face-to-face and non-face-to-face time, preparing to see the patient, obtaining and/or reviewing separately obtained history, performing a medically appropriate examination, counseling and educating the patient, ordering medications/tests/procedures, referring and communicating with other health care professionals, documenting clinical information in the electronic health record, independently interpreting results and communicating results to the patient, and care coordination.   Namon Cirri PA-C Department of Hematology/Oncology [facility] at [facility] Phone: [phone]  I have read the above note and personally examined the patient. I agree with the assessment and plan as noted above.  Briefly Wanda Weaver is a 62 year old female who presents for evaluation of lymphadenopathy.  She underwent a MRI of cervical spine of her cervical spine on 12/02/2022 which revealed concern for local lymphadenopathy.  The patient reports that she does have left shoulder pain but is not having any issues with weight loss, fevers, chills, sweats, or other B symptoms.  She is not having any other infectious symptoms.  Overall she reports she is at her baseline level of health.  At this time would recommend we perform an lymphadenopathy workup to include flow cytometry, CBC, CMP, LDH, as well as inflammatory markers.  I also think it be worthwhile to get a full CT chest abdomen pelvis to assess for the extent of her lymphadenopathy.  The patient voiced understanding of our plan moving forward.   Ulysees Barns, MD Department of Hematology/Oncology [facility] at [facility] Phone: [phone] Pager: [phone] Email: [email or web address]

## 2022-12-04 ENCOUNTER — Encounter: Payer: Self-pay | Admitting: Surgical

## 2022-12-04 ENCOUNTER — Inpatient Hospital Stay: Payer: Medicaid Other | Admitting: Physician Assistant

## 2022-12-04 ENCOUNTER — Inpatient Hospital Stay: Payer: Medicaid Other

## 2022-12-04 ENCOUNTER — Encounter: Payer: Self-pay | Admitting: Physician Assistant

## 2022-12-04 ENCOUNTER — Other Ambulatory Visit: Payer: Self-pay

## 2022-12-04 ENCOUNTER — Ambulatory Visit
Admission: RE | Admit: 2022-12-04 | Discharge: 2022-12-04 | Disposition: A | Discharge status: Home / Self Care | Payer: Self-pay | Source: Ambulatory Visit | Attending: Physician Assistant | Admitting: Physician Assistant

## 2022-12-04 VITALS — BP 176/80 | HR 75 | Temp 98.1°F | Resp 14 | Wt 205.0 lb

## 2022-12-04 DIAGNOSIS — Z808 Family history of malignant neoplasm of other organs or systems: Secondary | ICD-10-CM | POA: Diagnosis not present

## 2022-12-04 DIAGNOSIS — M7502 Adhesive capsulitis of left shoulder: Secondary | ICD-10-CM | POA: Diagnosis not present

## 2022-12-04 DIAGNOSIS — D649 Anemia, unspecified: Secondary | ICD-10-CM

## 2022-12-04 DIAGNOSIS — Z8041 Family history of malignant neoplasm of ovary: Secondary | ICD-10-CM | POA: Insufficient documentation

## 2022-12-04 DIAGNOSIS — Z800 Family history of malignant neoplasm of digestive organs: Secondary | ICD-10-CM | POA: Insufficient documentation

## 2022-12-04 DIAGNOSIS — R599 Enlarged lymph nodes, unspecified: Secondary | ICD-10-CM

## 2022-12-04 DIAGNOSIS — Z87891 Personal history of nicotine dependence: Secondary | ICD-10-CM | POA: Diagnosis not present

## 2022-12-04 DIAGNOSIS — R591 Generalized enlarged lymph nodes: Secondary | ICD-10-CM | POA: Diagnosis present

## 2022-12-04 LAB — CBC WITH DIFFERENTIAL (CANCER CENTER ONLY)
Abs Immature Granulocytes: 0.03 10*3/uL (ref 0.00–0.07)
Basophils Absolute: 0.1 10*3/uL (ref 0.0–0.1)
Basophils Relative: 1 %
Eosinophils Absolute: 0.4 10*3/uL (ref 0.0–0.5)
Eosinophils Relative: 9 %
HCT: 32.5 % — ABNORMAL LOW (ref 36.0–46.0)
Hemoglobin: 11.3 g/dL — ABNORMAL LOW (ref 12.0–15.0)
Immature Granulocytes: 1 %
Lymphocytes Relative: 21 %
Lymphs Abs: 1 10*3/uL (ref 0.7–4.0)
MCH: 28.7 pg (ref 26.0–34.0)
MCHC: 34.8 g/dL (ref 30.0–36.0)
MCV: 82.5 fL (ref 80.0–100.0)
Monocytes Absolute: 0.4 10*3/uL (ref 0.1–1.0)
Monocytes Relative: 9 %
Neutro Abs: 2.8 10*3/uL (ref 1.7–7.7)
Neutrophils Relative %: 59 %
Platelet Count: 197 10*3/uL (ref 150–400)
RBC: 3.94 MIL/uL (ref 3.87–5.11)
RDW: 13.5 % (ref 11.5–15.5)
WBC Count: 4.7 10*3/uL (ref 4.0–10.5)
nRBC: 0 % (ref 0.0–0.2)

## 2022-12-04 LAB — IRON AND IRON BINDING CAPACITY (CC-WL,HP ONLY)
Iron: 65 ug/dL (ref 28–170)
Saturation Ratios: 20 % (ref 10.4–31.8)
TIBC: 319 ug/dL (ref 250–450)
UIBC: 254 ug/dL (ref 148–442)

## 2022-12-04 LAB — CMP (CANCER CENTER ONLY)
ALT: 17 U/L (ref 0–44)
AST: 18 U/L (ref 15–41)
Albumin: 4 g/dL (ref 3.5–5.0)
Alkaline Phosphatase: 70 U/L (ref 38–126)
Anion gap: 7 (ref 5–15)
BUN: 24 mg/dL — ABNORMAL HIGH (ref 8–23)
CO2: 28 mmol/L (ref 22–32)
Calcium: 9.9 mg/dL (ref 8.9–10.3)
Chloride: 104 mmol/L (ref 98–111)
Creatinine: 1.16 mg/dL — ABNORMAL HIGH (ref 0.44–1.00)
GFR, Estimated: 54 mL/min — ABNORMAL LOW (ref >60)
Glucose, Bld: 122 mg/dL — ABNORMAL HIGH (ref 70–99)
Potassium: 4.4 mmol/L (ref 3.5–5.1)
Sodium: 139 mmol/L (ref 135–145)
Total Bilirubin: 0.6 mg/dL (ref 0.3–1.2)
Total Protein: 7.1 g/dL (ref 6.5–8.1)

## 2022-12-04 LAB — HEPATITIS B SURFACE ANTIBODY,QUALITATIVE: Hep B S Ab: NONREACTIVE

## 2022-12-04 LAB — HEPATITIS B CORE ANTIBODY, TOTAL: Hep B Core Total Ab: NONREACTIVE

## 2022-12-04 LAB — HEPATITIS C ANTIBODY: HCV Ab: NONREACTIVE

## 2022-12-04 LAB — VITAMIN B12: Vitamin B-12: 284 pg/mL (ref 180–914)

## 2022-12-04 LAB — SEDIMENTATION RATE: Sed Rate: 65 mm/hr — ABNORMAL HIGH (ref 0–22)

## 2022-12-04 LAB — HIV ANTIBODY (ROUTINE TESTING W REFLEX): HIV Screen 4th Generation wRfx: NONREACTIVE

## 2022-12-04 LAB — LACTATE DEHYDROGENASE: LDH: 115 U/L (ref 98–192)

## 2022-12-04 LAB — FOLATE: Folate: 6.1 ng/mL (ref >5.9)

## 2022-12-04 LAB — C-REACTIVE PROTEIN: CRP: 0.6 mg/dL (ref <1.0)

## 2022-12-04 LAB — FERRITIN: Ferritin: 89 ng/mL (ref 11–307)

## 2022-12-04 LAB — HEPATITIS B SURFACE ANTIGEN: Hepatitis B Surface Ag: NONREACTIVE

## 2022-12-04 NOTE — Progress Notes (Signed)
Follow-up Office Visit Note   Patient: Wanda Weaver           Date of Birth: 1960-09-16           MRN: [id number] Visit Date: 11/30/2022 Requested by: [name], NP [phone] [address] [place],  [place] [address] PCP: [name], NP  Subjective: Chief Complaint  Patient presents with   Other     Review MRI cervical spine    HPI: Wanda Weaver is a 62 y.o. female who returns to the office for follow-up visit.    Plan at last visit was: Patient is a 62 year old female who presents for evaluation of left shoulder pain.  She has definite loss of passive motion of the left shoulder relative to the right that along with her history of diabetes and her negative shoulder radiographs likely point towards adhesive capsulitis as the main driver of pain in her shoulder.  We discussed options available to patient and she would like to avoid injection for now.  Will proceed with home exercise program of the shoulder to work on range of motion exercises.   However, adhesive capsulitis does not explain why she is having radicular pain down the entire length of the extremity at times as well as the associated medial border scapular pain and neck pain that she has.  She is having blurry vision at times as well which, along with her family history of neurologic disorder, is suspicious for multiple sclerosis.  Plan to order MRI cervical spine to evaluate radiculopathy as well as MRI of the brain to evaluate for demyelinating disease.  Follow-up after imaging studies to review results.  Since then, patient notes she has continued symptoms.  She also has symptoms such as double vision and blurry vision.  She has seen Dr. Terrace Arabia from neurology and returns to see her on 9/27.  She continues to describe radicular pain down the left arm with numbness and tingling and pain that reaches the index/middle/ring fingers with dorsal and palmar numbness in these fingers.  She also has continued stiffness in the left  shoulder with increased pain with pretty much any shoulder range of motion.  She just recently had MRI of the cervical spine and MRI of the brain last Friday which has not been read by radiologist yet but she does have the disc available for viewing of the images.              ROS: All systems reviewed are negative as they relate to the chief complaint within the history of present illness.  Patient denies fevers or chills.  Assessment & Plan: Visit Diagnoses:  1. Adhesive capsulitis of left shoulder   2. Lymphadenopathy of head and neck region   3. Radicular pain in left arm     Plan: Wanda Weaver is a 62 y.o. female who returns to the office for follow-up visit.  Plan from last visit was noted above in HPI.  They now return with continued symptoms that are similar to last visit.  No improvement since her last visit back in April.  She has continued stiffness of the left shoulder consistent with adhesive capsulitis.  She also has radicular pain consistent with the degenerative changes noted on cervical spine MRI that was reviewed with her today.  Additionally, after she left the clinic on Monday, office received urgent fax with the official radiology report demonstrating left-sided supraclavicular lymphadenopathy concerning for malignancy.  I reached out to the patient on  Wednesday evening to report her about these findings which may be responsible for her left-sided arm swelling that is increased compared with the right arm.  Strongly encourage patient to reach out to her PCP for further workup with possible referral to oncology.  She agreed with this plan.  I also sent Angus Seller, NP a message detailing what we discussed between me and Lien.  In regards to the shoulder and neck pathology, we initially planned on open MRI arthrogram of the left shoulder to evaluate adhesive capsulitis and referral to Dr. Christell Constant for left sided radiculopathy from the cervical spine.  She would like to proceed with  manipulation under anesthesia but we will hold off on any of this until she has further workup by oncology per discussion with Dr. August Saucer.  Follow-Up Instructions: No follow-ups on file.   Orders:  No orders of the defined types were placed in this encounter.  No orders of the defined types were placed in this encounter.     Procedures: No procedures performed   Clinical Data: No additional findings.  Objective: Vital Signs: There were no vitals taken for this visit.  Physical Exam:  Constitutional: Patient appears well-developed HEENT:  Head: Normocephalic Eyes:EOM are normal Neck: Normal range of motion Cardiovascular: Normal rate Pulmonary/chest: Effort normal Neurologic: Patient is alert Skin: Skin is warm Psychiatric: Patient has normal mood and affect  Ortho Exam: Ortho exam demonstrates left shoulder with 20 degrees X rotation, 60 degrees abduction, 95 degrees forward elevation.  This compared with the right shoulder with 70 degrees X rotation, 100 agrees abduction, 170 degrees forward elevation.  Patient has increased swelling throughout the majority of the left arm compared with the right arm.  There is no lymphadenopathy that is palpable throughout the head and neck region.  No gross weakness to EPL, FPL, finger abduction, pronation/supination, bicep, tricep, deltoid relative side-to-side.  No cellulitis or skin changes noted throughout the shoulder girdle region.  Specialty Comments:  No specialty comments available.  Imaging: No results found.   PMFS History: Patient Active Problem List   Diagnosis Date Noted   Paresthesia 10/22/2022   Gait abnormality 10/22/2022   Near syncope 09/14/2022   Adhesive capsulitis of left shoulder 07/24/2022   Constipation 05/25/2022   Osteoarthritis of right shoulder 04/22/2022   Bilateral lower extremity pain 08/20/2019   Bilateral lower extremity edema 08/20/2019   Hemoglobin A1C greater than 9%, indicating poor diabetic  control 02/21/2019   Weight loss 02/21/2019   Muscle strain of wrist, left, initial encounter 09/22/2018   Hyperglycemia 08/15/2018   Essential hypertension 04/25/2018   Class 1 obesity due to excess calories with serious comorbidity in adult 04/25/2018   Right foot pain 04/25/2018   Anxiety 04/25/2018   Type 2 diabetes mellitus with complication, with long-term current use of insulin (HCC) 08/06/2017   Past Medical History:  Diagnosis Date   Bilateral lower extremity edema 07/2019   Bilateral lower extremity pain 07/2019   Diabetes mellitus without complication (HCC)    Hemoglobin A1C greater than 9%, indicating poor diabetic control    Hyperlipidemia    Hypertension    Vitamin D deficiency     Family History  Problem Relation Age of Onset   Diabetes Mother    Dementia Mother    Diabetes Father    Hypertension Father     Past Surgical History:  Procedure Laterality Date   APPENDECTOMY     CARPAL TUNNEL RELEASE Bilateral    CHOLECYSTECTOMY  WRIST SURGERY Bilateral    Social History   Occupational History   Not on file  Tobacco Use   Smoking status: Never   Smokeless tobacco: Never  Vaping Use   Vaping status: Never Used  Substance and Sexual Activity   Alcohol use: No   Drug use: No   Sexual activity: Yes    Partners: Male

## 2022-12-04 NOTE — Patient Instructions (Signed)
Diagnostic Clinic Office Visit Discharge Information and Instructions  Thank you for choosing [facility] for your healthcare needs.  Below is a summary of today's discussion, along with our contact information and an outline of what to expect next.  Reason for Visit:  enlarged lymph nodes  Proposed Diagnostic Care Plan: Labs collected today including iron studies CT scan of chest, abdomen, pelvis ordered. After we let you know the scan is approved, please call [phone] to schedule. Image can be performed at any of the [facility] locations  What to Expect: - Generally, when lab tests are ordered the results can take up to 1 week for results to be available.  At that point, we will contact you to discuss your results with you.  Unless there is a critical result, we will typically wait for all of your lab results to be available before contacting you. - If a biopsy is part of your Care Plan, those results can take on average 7-10 days to result.  Once results are available, we will contact you to discuss your pathology results and any next steps. - If you have additional imaging ordered, such as a CT Scan, MRI, Ultrasound, Bone Scan, or PET scan, your imaging will need to be authorized then scheduled with the earliest available appointment.  You may be asked to travel to another hospital within [facility] who has a sooner availability, please consider doing so if asked. - If you use MyChart, your results will be available to you in the MyChart portal.  Your provider will be in touch with you as soon as all of your results are available to be discussed.  Your Diagnostic Clinic Provider:  Namon Cirri PA-C and Dr. Leonides Schanz   If you or your caregiver have number blocking on your cell phones, please ensure the cancer center's numbers are not blocked.  If you are not a registered MyChart user, please consider enrolling in MyChart to receive your test results and visit notes.  You can also  access your discharge instructions electronically.  MyChart also gives you an electronic means to communicate with your Care Team instead of needing to call in to the cancer center.  We appreciate you trusting [place] with your healthcare and look forward to partnering with you as we work to uncover what your potential diagnosis may be.  Please do not hesitate to reach out at any point with questions or concerns.

## 2022-12-07 ENCOUNTER — Encounter (HOSPITAL_COMMUNITY): Payer: Self-pay

## 2022-12-07 ENCOUNTER — Ambulatory Visit (HOSPITAL_COMMUNITY)
Admission: RE | Admit: 2022-12-07 | Discharge: 2022-12-07 | Disposition: A | Discharge status: Home / Self Care | Payer: Medicaid Other | Source: Ambulatory Visit | Attending: Physician Assistant | Admitting: Physician Assistant

## 2022-12-07 DIAGNOSIS — R599 Enlarged lymph nodes, unspecified: Secondary | ICD-10-CM | POA: Insufficient documentation

## 2022-12-07 LAB — SURGICAL PATHOLOGY

## 2022-12-07 MED ORDER — IOHEXOL 300 MG/ML  SOLN
100.0000 mL | Freq: Once | INTRAMUSCULAR | Status: AC | PRN
  Administered 2022-12-07: 100 mL via INTRAVENOUS

## 2022-12-07 MED ORDER — SODIUM CHLORIDE (PF) 0.9 % IJ SOLN
INTRAMUSCULAR | Status: AC
  Filled 2022-12-07: qty 50

## 2022-12-08 ENCOUNTER — Other Ambulatory Visit: Payer: Self-pay | Admitting: Orthopedic Surgery

## 2022-12-08 ENCOUNTER — Other Ambulatory Visit: Payer: Self-pay

## 2022-12-08 ENCOUNTER — Telehealth: Payer: Self-pay | Admitting: Clinical

## 2022-12-08 DIAGNOSIS — M25512 Pain in left shoulder: Secondary | ICD-10-CM

## 2022-12-08 LAB — FLOW CYTOMETRY

## 2022-12-08 NOTE — Telephone Encounter (Addendum)
Integrated Behavioral Health Progress Note  12/08/2022 Name: Wanda Weaver MRN: [id number] DOB: 03-15-1961 Wanda Weaver is a 62 y.o. year old female who sees [name], NP for primary care. LCSW was initially consulted to assess the patient's needs and assit with community resources.   Interpreter: No.   Interpreter Name & Language: none  Assessment: Patient experiencing financial difficulties due to low income and health concerns.  Ongoing Intervention: Patient called CSW while CSW out of office 8/22-8/23/24 and indicated in her voice mail that she had an appointment with the cancer center Monday. CSW called patient back today to check in. Patient did go to her cancer center appointment yesterday, awaiting test results. Provided brief supporitve counseling including emotional validation and reflective listneing. Patient is concerned about this referral over to the cancer center and has been dealing with health concerns for some time that she has not yet gotten answers on. She also continues to await disability determination.  Patient reported she did not get a food delivery from the [employer] program this month. CSW sent message to the program to confirm patient will continue to receive deliveries.  Abigail Butts, LCSW Patient Care Center [facility] [phone]

## 2022-12-09 ENCOUNTER — Ambulatory Visit: Payer: Medicaid Other | Admitting: Surgical

## 2022-12-10 ENCOUNTER — Ambulatory Visit: Payer: Self-pay | Admitting: Nurse Practitioner

## 2022-12-11 ENCOUNTER — Encounter: Payer: Self-pay | Admitting: Physician Assistant

## 2022-12-11 ENCOUNTER — Telehealth: Payer: Self-pay | Admitting: Physician Assistant

## 2022-12-11 DIAGNOSIS — R599 Enlarged lymph nodes, unspecified: Secondary | ICD-10-CM

## 2022-12-11 NOTE — Telephone Encounter (Signed)
I notified Wanda Weaver by phone regarding CT CAP results. Findings are concerning for lymphoma, pulmonary sarcoidosis or disease of unknown primary. Order placed for lymph node biopsy and discussed with patient this is to help determine diagnosis. She prefers for the biopsy to be performed at [facility] if possible. All of patient's questions were answered and she expressed understanding of the plan provided.

## 2022-12-18 NOTE — Progress Notes (Signed)
Oley Balm, MD  Leodis Rains D PROCEDURE / BIOPSY REVIEW Date: 12/18/22  Requested Biopsy site: L supraclan LAN Reason for request: r/o lymphoma/mets Imaging review: Best seen on CT 12/07/22  Decision: Approved Imaging modality to perform: Ultrasound Schedule with: Patient preference (Local vs Mod Sed) Schedule for: Any VIR  Additional comments: @VIR : in saline  Please contact me with questions, concerns, or if issue pertaining to this request arise.  Dayne Oley Balm, MD [facility]

## 2022-12-27 ENCOUNTER — Other Ambulatory Visit: Payer: Self-pay | Admitting: Nurse Practitioner

## 2022-12-27 ENCOUNTER — Other Ambulatory Visit: Payer: Self-pay | Admitting: Orthopedic Surgery

## 2022-12-27 DIAGNOSIS — I10 Essential (primary) hypertension: Secondary | ICD-10-CM

## 2022-12-28 ENCOUNTER — Other Ambulatory Visit: Payer: Self-pay

## 2022-12-28 DIAGNOSIS — I10 Essential (primary) hypertension: Secondary | ICD-10-CM

## 2022-12-28 MED ORDER — LOSARTAN POTASSIUM 25 MG PO TABS
25.0000 mg | ORAL_TABLET | Freq: Every day | ORAL | 0 refills | Status: DC
  Filled 2022-12-28 – 2023-01-30 (×2): qty 90, 90d supply, fill #0

## 2022-12-28 MED ORDER — DIAZEPAM 5 MG PO TABS
5.0000 mg | ORAL_TABLET | Freq: Two times a day (BID) | ORAL | 0 refills | Status: DC | PRN
  Filled 2022-12-28 – 2023-01-21 (×2): qty 6, 3d supply, fill #0

## 2022-12-29 ENCOUNTER — Other Ambulatory Visit: Payer: Self-pay

## 2022-12-30 ENCOUNTER — Ambulatory Visit: Payer: Medicaid Other | Admitting: Orthopedic Surgery

## 2022-12-31 ENCOUNTER — Other Ambulatory Visit: Payer: Self-pay

## 2023-01-01 ENCOUNTER — Telehealth: Payer: Self-pay | Admitting: Clinical

## 2023-01-01 NOTE — Telephone Encounter (Addendum)
Integrated Behavioral Health Progress Note  01/01/2023 Name: Wanda Weaver MRN: [id number] DOB: 1960-06-20 Wanda Weaver is a 62 y.o. year old female who sees [name], NP for primary care. LCSW was initially consulted to assess the patient's needs and assit with community resources.   Interpreter: No.   Interpreter Name & Language: none  Assessment: Patient experiencing financial difficulties due to low income and health concerns.  Ongoing Intervention: Patient called and inquired about financial assistance resources, as she and her partner are still in need of assistance with their rent. Advised that they can try the [employer]) but they will need to wait until the first business day of the month, and go on that day as they run out of funds quickly.   Patient also reported that she is being followed by oncology and there is concern she may have lymphoma. She is getting a biopsy next week. Advised patient to also request to meet with cancer center social workers, as they may have additional financial assistance resources.   Patient still has not received her food delivery from [employer]. CSW called the program and they advised that one of their delivery drivers has been ill; patient is now scheduled to receive a delivery this coming Tuesday though.  Wanda Butts, LCSW Patient Care Center [facility] [phone]

## 2023-01-05 ENCOUNTER — Other Ambulatory Visit: Payer: Self-pay | Admitting: Radiology

## 2023-01-05 DIAGNOSIS — R599 Enlarged lymph nodes, unspecified: Secondary | ICD-10-CM

## 2023-01-06 ENCOUNTER — Ambulatory Visit (HOSPITAL_COMMUNITY): Payer: Medicaid Other

## 2023-01-06 ENCOUNTER — Other Ambulatory Visit: Payer: Self-pay

## 2023-01-06 NOTE — Progress Notes (Signed)
Wanda Weaver 1960-12-20 [id number]  Patient attempted to be outreached by Thomasene Ripple, PharmD Candidate to discuss hypertension. Left voicemail for patient to return our call at their convenience at [phone].  Thomasene Ripple, Student-PharmD

## 2023-01-08 ENCOUNTER — Encounter: Payer: Medicaid Other | Admitting: Neurology

## 2023-01-11 ENCOUNTER — Other Ambulatory Visit: Payer: Self-pay

## 2023-01-15 ENCOUNTER — Ambulatory Visit: Payer: Medicaid Other | Admitting: Nurse Practitioner

## 2023-01-15 ENCOUNTER — Encounter: Payer: Self-pay | Admitting: Nurse Practitioner

## 2023-01-15 VITALS — BP 151/65 | HR 77 | Resp 16 | Ht 62.0 in | Wt 206.4 lb

## 2023-01-15 DIAGNOSIS — E118 Type 2 diabetes mellitus with unspecified complications: Secondary | ICD-10-CM | POA: Diagnosis not present

## 2023-01-15 DIAGNOSIS — Z794 Long term (current) use of insulin: Secondary | ICD-10-CM

## 2023-01-15 DIAGNOSIS — R7309 Other abnormal glucose: Secondary | ICD-10-CM

## 2023-01-15 LAB — POCT GLYCOSYLATED HEMOGLOBIN (HGB A1C): Hemoglobin A1C: 8.3 % — AB (ref 4.0–5.6)

## 2023-01-15 NOTE — Progress Notes (Signed)
Subjective   Patient ID: Wanda Weaver, female    DOB: 06-02-1960, 62 y.o.   MRN: [id number]  Chief Complaint  Patient presents with   Medical Management of Chronic Issues    Referring provider: [name], NP  Wanda Weaver is a 62 y.o. female with Past Medical History: 07/2019: Bilateral lower extremity edema 07/2019: Bilateral lower extremity pain No date: Diabetes mellitus without complication (HCC) No date: Hemoglobin A1C greater than 9%, indicating poor diabetic  control No date: Hyperlipidemia No date: Hypertension No date: Vitamin D deficiency   HPI  Patient presents today for follow-up visit.  Overall she has been stable since her last visit here.  She did follow-up with Ortho and had imaging which did incidentally show enlarged lymph nodes.  She has been referred to oncology and does have a biopsy scheduled for this upcoming Tuesday.  A1c in office today was 8.3.  Will place referral to pharmacy for medication management.  Denies f/c/s, n/v/d, hemoptysis, PND, leg swelling. Denies chest pain or edema.     Allergies  Allergen Reactions   Ibuprofen Palpitations    Immunization History  Administered Date(s) Administered   Pneumococcal Polysaccharide-23 08/06/2017    Tobacco History: Social History   Tobacco Use  Smoking Status Never  Smokeless Tobacco Never   Counseling given: Not Answered   Outpatient Encounter Medications as of 01/15/2023  Medication Sig   Accu-Chek Softclix Lancets lancets Use as instructed   albuterol (VENTOLIN HFA) 108 (90 Base) MCG/ACT inhaler Inhale 2 puffs into the lungs every 6 (six) hours as needed.   blood glucose meter kit and supplies KIT Use up to four times daily as directed.   cyclobenzaprine (FLEXERIL) 10 MG tablet Take 1 tablet (10 mg total) by mouth 2 (two) times daily as needed for muscle spasms.   docusate sodium (COLACE) 100 MG capsule Take 1 capsule (100 mg total) by mouth 2 (two) times daily.   Elastic Bandages &  Supports (KNEE BRACE ADJUSTABLE HINGED) MISC 1 application by Does not apply route daily.   glucose blood (ACCU-CHEK GUIDE) test strip Use as instructed   insulin glargine (LANTUS) 100 UNIT/ML injection Inject 0.34 mLs (34 Units total) into the skin daily.   Insulin Syringe-Needle U-100 (INSULIN SYRINGE .5CC/31GX5/16") 31G X 5/16" 0.5 ML MISC Use to inject insulin daily (Patient not taking: Reported on 02/09/2023)   metoprolol succinate (TOPROL-XL) 50 MG 24 hr tablet Take 1 tablet (50 mg total) by mouth daily. Take with or immediately following a meal.   omeprazole (PRILOSEC) 20 MG capsule Take 1 capsule (20 mg total) by mouth daily.   ondansetron (ZOFRAN) 4 MG tablet Take 1 tablet (4 mg total) by mouth every 8 (eight) hours as needed for nausea or vomiting.   [DISCONTINUED] acetaminophen (TYLENOL) 500 MG tablet Take 500 mg by mouth every 6 (six) hours as needed. (Patient not taking: Reported on 02/09/2023)   [DISCONTINUED] celecoxib (CELEBREX) 200 MG capsule Take 1 capsule (200 mg total) by mouth 2 (two) times daily.   [DISCONTINUED] diazepam (VALIUM) 5 MG tablet Take 1 tablet (5 mg total) by mouth every 12 (twelve) hours as needed for anxiety.   [DISCONTINUED] hydrochlorothiazide (HYDRODIURIL) 25 MG tablet Take 1 tablet (25 mg total) by mouth daily.   [DISCONTINUED] hydrOXYzine (VISTARIL) 25 MG capsule Take 1 capsule (25 mg total) by mouth every 8 (eight) hours as needed. (Patient not taking: Reported on 02/09/2023)   [DISCONTINUED] losartan (COZAAR) 25 MG tablet Take 1 tablet (25 mg  total) by mouth daily.   [DISCONTINUED] metFORMIN (GLUCOPHAGE) 500 MG tablet Take 2 tablets (1,000 mg total) by mouth 2 (two) times daily with a meal.   [DISCONTINUED] methocarbamol (ROBAXIN) 500 MG tablet Take 1 tablet (500 mg total) by mouth every 8 (eight) hours as needed for muscle spasms. (Patient not taking: Reported on 02/09/2023)   [DISCONTINUED] rosuvastatin (CRESTOR) 40 MG tablet Take 1 tablet (40 mg total) by  mouth daily.   [DISCONTINUED] Continuous Blood Gluc Receiver (FREESTYLE LIBRE 2 READER) DEVI Use to check glucose continuously (Patient not taking: Reported on 01/15/2023)   [DISCONTINUED] Continuous Blood Gluc Sensor (FREESTYLE LIBRE 2 SENSOR) MISC Apply a new sensor every 14 days. Use to check glucose continuously (Patient not taking: Reported on 01/15/2023)   No facility-administered encounter medications on file as of 01/15/2023.    Review of Systems  Review of Systems  Constitutional: Negative.   HENT: Negative.    Cardiovascular: Negative.   Gastrointestinal: Negative.   Allergic/Immunologic: Negative.   Neurological: Negative.   Psychiatric/Behavioral: Negative.       Objective:   BP (!) 151/65   Pulse 77   Resp 16   Ht 5\' 2"  (1.575 m)   Wt 206 lb 6.4 oz (93.6 kg)   SpO2 100%   BMI 37.75 kg/m   Wt Readings from Last 5 Encounters:  01/19/23 206 lb (93.4 kg)  01/15/23 206 lb 6.4 oz (93.6 kg)  12/04/22 205 lb (93 kg)  10/22/22 200 lb (90.7 kg)  10/16/22 201 lb (91.2 kg)     Physical Exam Vitals and nursing note reviewed.  Constitutional:      General: She is not in acute distress.    Appearance: She is well-developed.  Cardiovascular:     Rate and Rhythm: Normal rate and regular rhythm.  Pulmonary:     Effort: Pulmonary effort is normal.     Breath sounds: Normal breath sounds.  Neurological:     Mental Status: She is alert and oriented to person, place, and time.       Assessment & Plan:   Type 2 diabetes mellitus with complication, with long-term current use of insulin (HCC) -     POCT glycosylated hemoglobin (Hb A1C) -     Microalbumin / creatinine urine ratio  Hemoglobin A1C greater than 9%, indicating poor diabetic control -     AMB Referral to Pharmacy Medication Management     Return in about 3 months (around 04/17/2023).   [name], NP 02/25/2023

## 2023-01-15 NOTE — Patient Instructions (Signed)
1. Type 2 diabetes mellitus with complication, with long-term current use of insulin (HCC)  - POCT glycosylated hemoglobin (Hb A1C) - Microalbumin / creatinine urine ratio   2. Hemoglobin A1C greater than 9%, indicating poor diabetic control  - AMB Referral to Pharmacy Medication Management    Follow up:  Follow up in 3 months

## 2023-01-18 ENCOUNTER — Telehealth: Payer: Self-pay

## 2023-01-18 ENCOUNTER — Other Ambulatory Visit: Payer: Self-pay | Admitting: Student

## 2023-01-18 NOTE — Progress Notes (Signed)
Care Guide Note  01/18/2023 Name: Wanda Weaver MRN: [id number] DOB: 02/04/1961  Referred by: [name], NP Reason for referral : Care Coordination (Outreach to schedule With Pharm d )   Wanda Weaver is a 62 y.o. year old female who is a primary care patient of [name], NP. Wanda Weaver was referred to the pharmacist for assistance related to DM.    Successful contact was made with the patient to discuss pharmacy services including being ready for the pharmacist to call at least 5 minutes before the scheduled appointment time, to have medication bottles and any blood sugar or blood pressure readings ready for review. The patient agreed to meet with the pharmacist via with the pharmacist via telephone visit on (date/time).  02/09/2023  Wanda Weaver, RMA Care Guide [facility]  [place], [place] [address] Direct Dial: [phone] [email or web address]

## 2023-01-18 NOTE — Progress Notes (Signed)
Care Guide Note  01/18/2023 Name: Wanda Weaver MRN: [id number] DOB: [birth year]  Referred by: [name], NP Reason for referral : Care Coordination (Outreach to schedule with Pharm d )   Wanda Weaver is a 62 y.o. year old female who is a primary care patient of [name], NP. Wanda Weaver was referred to the pharmacist for assistance related to DM.    Successful contact was made with the patient to discuss pharmacy services including being ready for the pharmacist to call at least 5 minutes before the scheduled appointment time, to have medication bottles and any blood sugar or blood pressure readings ready for review. The patient agreed to meet with the pharmacist via with the pharmacist via telephone visit on (date/time).  02/09/2023  Penne Lash, RMA Care Guide [facility]  [place], [place] [address] Direct Dial: [phone] [email or web address]

## 2023-01-19 ENCOUNTER — Encounter (HOSPITAL_COMMUNITY): Payer: Self-pay

## 2023-01-19 ENCOUNTER — Ambulatory Visit (HOSPITAL_COMMUNITY)
Admission: RE | Admit: 2023-01-19 | Discharge: 2023-01-19 | Disposition: A | Discharge status: Home / Self Care | Payer: Medicaid Other | Source: Ambulatory Visit | Attending: Physician Assistant | Admitting: Physician Assistant

## 2023-01-19 ENCOUNTER — Other Ambulatory Visit: Payer: Self-pay

## 2023-01-19 DIAGNOSIS — R896 Abnormal cytological findings in specimens from other organs, systems and tissues: Secondary | ICD-10-CM | POA: Diagnosis not present

## 2023-01-19 DIAGNOSIS — R599 Enlarged lymph nodes, unspecified: Secondary | ICD-10-CM | POA: Diagnosis present

## 2023-01-19 LAB — GLUCOSE, CAPILLARY: Glucose-Capillary: 196 mg/dL — ABNORMAL HIGH (ref 70–99)

## 2023-01-19 LAB — CBC WITH DIFFERENTIAL/PLATELET
Abs Immature Granulocytes: 0.02 10*3/uL (ref 0.00–0.07)
Basophils Absolute: 0.1 10*3/uL (ref 0.0–0.1)
Basophils Relative: 1 %
Eosinophils Absolute: 0.4 10*3/uL (ref 0.0–0.5)
Eosinophils Relative: 8 %
HCT: 34.4 % — ABNORMAL LOW (ref 36.0–46.0)
Hemoglobin: 11.2 g/dL — ABNORMAL LOW (ref 12.0–15.0)
Immature Granulocytes: 0 %
Lymphocytes Relative: 18 %
Lymphs Abs: 0.9 10*3/uL (ref 0.7–4.0)
MCH: 27.3 pg (ref 26.0–34.0)
MCHC: 32.6 g/dL (ref 30.0–36.0)
MCV: 83.7 fL (ref 80.0–100.0)
Monocytes Absolute: 0.5 10*3/uL (ref 0.1–1.0)
Monocytes Relative: 9 %
Neutro Abs: 3.1 10*3/uL (ref 1.7–7.7)
Neutrophils Relative %: 64 %
Platelets: 201 10*3/uL (ref 150–400)
RBC: 4.11 MIL/uL (ref 3.87–5.11)
RDW: 12.9 % (ref 11.5–15.5)
WBC: 4.9 10*3/uL (ref 4.0–10.5)
nRBC: 0 % (ref 0.0–0.2)

## 2023-01-19 LAB — BASIC METABOLIC PANEL
Anion gap: 13 (ref 5–15)
BUN: 22 mg/dL (ref 8–23)
CO2: 25 mmol/L (ref 22–32)
Calcium: 9.9 mg/dL (ref 8.9–10.3)
Chloride: 98 mmol/L (ref 98–111)
Creatinine, Ser: 1.29 mg/dL — ABNORMAL HIGH (ref 0.44–1.00)
GFR, Estimated: 47 mL/min — ABNORMAL LOW (ref >60)
Glucose, Bld: 204 mg/dL — ABNORMAL HIGH (ref 70–99)
Potassium: 4.1 mmol/L (ref 3.5–5.1)
Sodium: 136 mmol/L (ref 135–145)

## 2023-01-19 LAB — PROTIME-INR
INR: 1 (ref 0.8–1.2)
Prothrombin Time: 13.5 s (ref 11.4–15.2)

## 2023-01-19 MED ORDER — LIDOCAINE HCL (PF) 1 % IJ SOLN
2.0000 mL | Freq: Once | INTRAMUSCULAR | Status: AC
  Administered 2023-01-19: 2 mL via INTRADERMAL

## 2023-01-19 MED ORDER — MIDAZOLAM HCL 2 MG/2ML IJ SOLN
INTRAMUSCULAR | Status: AC | PRN
Start: 2023-01-19 — End: 2023-01-19
  Administered 2023-01-19: 1 mg via INTRAVENOUS

## 2023-01-19 MED ORDER — FENTANYL CITRATE (PF) 100 MCG/2ML IJ SOLN
INTRAMUSCULAR | Status: AC | PRN
Start: 2023-01-19 — End: 2023-01-19
  Administered 2023-01-19: 50 ug via INTRAVENOUS

## 2023-01-19 MED ORDER — SODIUM CHLORIDE 0.9 % IV SOLN: INTRAVENOUS | Status: DC

## 2023-01-19 MED ORDER — FENTANYL CITRATE (PF) 100 MCG/2ML IJ SOLN
INTRAMUSCULAR | Status: AC
  Filled 2023-01-19: qty 2

## 2023-01-19 MED ORDER — MIDAZOLAM HCL 2 MG/2ML IJ SOLN
INTRAMUSCULAR | Status: AC
  Filled 2023-01-19: qty 2

## 2023-01-19 NOTE — Procedures (Signed)
Interventional Radiology Procedure Note  Procedure: US guided left supraclavicular LN biopsy  Indication: Lymphadenopathy  Findings: Please refer to procedural dictation for full description.  Complications: None  EBL: < 10 mL  Acquanetta Belling, MD [phone]

## 2023-01-19 NOTE — H&P (Signed)
Chief Complaint: Patient was seen in consultation today for left supraclavicular lymph node biopsy at the request of Walisiewicz,Kaitlyn E  Referring Physician(s): Shanon Ace  Supervising Physician: Mir, Mauri Reading  Patient Status: [facility] - Out-pt  History of Present Illness: Wanda Weaver is a 62 y.o. female   FULL Code status per pt DM; HTN; B LE edema; anxiety Frozen left shoulder MRI ordered and performed per Ortho Abnormal LAN noted on MRI  CT 8/26:  IMPRESSION: 1. Numerous enlarged lower left cervical and supraclavicular nodes, mediastinal and hilar lymph nodes, as well as gastrohepatic ligament, porta hepatis, portacaval nodes in the upper abdomen. 2. Numerous small bilateral pulmonary nodules measuring up to 0.5 cm. 3. Constellation of findings is highly concerning for lymphoma or metastatic disease of unknown primary, however could also reflect nodal and pulmonary sarcoidosis. Consider tissue sampling. PET-CT is likely of limited value to distinguish between malignancy and sarcoidosis. 4. Large umbilical hernia containing multiple loops of nonobstructed distal small bowel. 5. 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 Attention on follow-up. 6. Coronary artery disease.   Scheduled now for [address] biopsy   Past Medical History:  Diagnosis Date   Bilateral lower extremity edema 07/2019   Bilateral lower extremity pain 07/2019   Diabetes mellitus without complication (HCC)    Hemoglobin A1C greater than 9%, indicating poor diabetic control    Hyperlipidemia    Hypertension    Vitamin D deficiency     Past Surgical History:  Procedure Laterality Date   APPENDECTOMY     CARPAL TUNNEL RELEASE Bilateral    CHOLECYSTECTOMY     WRIST SURGERY Bilateral     Allergies: Ibuprofen  Medications: Prior to Admission medications   Medication Sig Start Date End Date Taking? Authorizing  Provider  acetaminophen (TYLENOL) 500 MG tablet Take 500 mg by mouth every 6 (six) hours as needed.   Yes [provider]  albuterol (VENTOLIN HFA) 108 (90 Base) MCG/ACT inhaler Inhale 2 puffs into the lungs every 6 (six) hours as needed. 07/24/22  Yes [name], NP  celecoxib (CELEBREX) 200 MG capsule Take 1 capsule (200 mg total) by mouth 2 (two) times daily. 10/04/22  Yes Glyn Ade, MD  cyclobenzaprine (FLEXERIL) 10 MG tablet Take 1 tablet (10 mg total) by mouth 2 (two) times daily as needed for muscle spasms. 07/06/22  Yes Fayrene Helper, PA-C  diazepam (VALIUM) 5 MG tablet Take 1 tablet (5 mg total) by mouth every 12 (twelve) hours as needed for anxiety. 12/28/22  Yes Magnant, Charles L, PA-C  hydrochlorothiazide (HYDRODIURIL) 25 MG tablet Take 1 tablet (25 mg total) by mouth daily. 08/03/22  Yes [name], NP  insulin glargine (LANTUS) 100 UNIT/ML injection Inject 0.34 mLs (34 Units total) into the skin daily. 08/03/22  Yes [name], NP  losartan (COZAAR) 25 MG tablet Take 1 tablet (25 mg total) by mouth daily. 12/28/22  Yes [name], NP  metFORMIN (GLUCOPHAGE) 500 MG tablet Take 2 tablets (1,000 mg total) by mouth 2 (two) times daily with a meal. 11/23/22  Yes [name], NP  methocarbamol (ROBAXIN) 500 MG tablet Take 1 tablet (500 mg total) by mouth every 8 (eight) hours as needed for muscle spasms. 05/22/22  Yes Cammy Copa, MD  metoprolol succinate (TOPROL-XL) 50 MG 24 hr tablet Take 1 tablet (50 mg total) by mouth daily. Take with or immediately following a meal.  08/03/22  Yes [name], NP  omeprazole (PRILOSEC) 20 MG capsule Take 1 capsule (20 mg total) by mouth daily. 02/05/22  Yes [name], NP  ondansetron (ZOFRAN) 4 MG tablet Take 1 tablet (4 mg total) by mouth every 8 (eight) hours as needed for nausea or vomiting. 04/03/22  Yes [name], Baird Kay, FNP  rosuvastatin (CRESTOR) 40 MG tablet Take 1 tablet (40 mg total) by  mouth daily. 08/03/22  Yes [name], NP  Accu-Chek Softclix Lancets lancets Use as instructed 03/25/22   [name], NP  blood glucose meter kit and supplies KIT Use up to four times daily as directed. 03/25/22   [name], NP  Continuous Blood Gluc Receiver (FREESTYLE LIBRE 2 READER) DEVI Use to check glucose continuously Patient not taking: Reported on 01/15/2023 06/19/22   [name], NP  Continuous Blood Gluc Sensor (FREESTYLE LIBRE 2 SENSOR) MISC Apply a new sensor every 14 days. Use to check glucose continuously Patient not taking: Reported on 01/15/2023 06/19/22   [name], NP  docusate sodium (COLACE) 100 MG capsule Take 1 capsule (100 mg total) by mouth 2 (two) times daily. 05/22/22   [name], NP  Elastic Bandages & Supports (KNEE BRACE ADJUSTABLE HINGED) MISC 1 application by Does not apply route daily. 05/09/21   Barbette Merino, NP  glucose blood (ACCU-CHEK GUIDE) test strip Use as instructed 03/25/22   [name], NP  hydrOXYzine (VISTARIL) 25 MG capsule Take 1 capsule (25 mg total) by mouth every 8 (eight) hours as needed. 05/22/22   [name], NP  Insulin Syringe-Needle U-100 (INSULIN SYRINGE .5CC/31GX5/16") 31G X 5/16" 0.5 ML MISC Use to inject insulin daily 08/03/22   [name], NP     Family History  Problem Relation Age of Onset   Diabetes Mother    Dementia Mother    Diabetes Father    Hypertension Father     Social History   Socioeconomic History   Marital status: Single    Spouse name: Not on file   Number of children: Not on file   Years of education: Not on file   Highest education level: Not on file  Occupational History   Not on file  Tobacco Use   Smoking status: Never   Smokeless tobacco: Never  Vaping Use   Vaping status: Never Used  Substance and Sexual Activity   Alcohol use: No   Drug use: No   Sexual activity: Yes    Partners: Male  Other Topics Concern   Not on file  Social History  Narrative   Not on file   Social Determinants of Health   Financial Resource Strain: High Risk (07/30/2022)   Overall Financial Resource Strain (CARDIA)    Difficulty of Paying Living Expenses: Very hard  Food Insecurity: Food Insecurity Present (07/30/2022)   Hunger Vital Sign    Worried About Running Out of Food in the Last Year: Often true    Ran Out of Food in the Last Year: Often true  Transportation Needs: Unmet Transportation Needs (07/30/2022)   PRAPARE - Administrator, Civil Service (Medical): Yes    Lack of Transportation (Non-Medical): No  Physical Activity: Not on file  Stress: Not on file  Social Connections: Unknown (11/06/2022)   Received from [facility]   Social Network    Social Network: Not on file    Review of Systems: A 12 point ROS discussed  and pertinent positives are indicated in the HPI above.  All other systems are negative.  Review of Systems  Constitutional:  Negative for activity change, fatigue and fever.  HENT:  Negative for sore throat.   Respiratory:  Negative for cough and shortness of breath.   Cardiovascular:  Positive for leg swelling.  Gastrointestinal:  Negative for abdominal pain and nausea.  Musculoskeletal:  Negative for neck pain.  Neurological:  Positive for weakness.  Psychiatric/Behavioral:  Negative for behavioral problems and confusion.     Vital Signs: BP (!) 149/69   Pulse 77   Temp 97.7 F (36.5 C) (Temporal)   Resp 18   Ht 5\' 2"  (1.575 m)   Wt 206 lb (93.4 kg)   SpO2 99%   BMI 37.68 kg/m     Physical Exam Vitals reviewed.  HENT:     Mouth/Throat:     Mouth: Mucous membranes are moist.  Cardiovascular:     Rate and Rhythm: Normal rate and regular rhythm.     Heart sounds: Normal heart sounds.  Pulmonary:     Effort: Pulmonary effort is normal.     Breath sounds: Normal breath sounds. No wheezing.  Abdominal:     Palpations: Abdomen is soft.     Tenderness: There is no abdominal tenderness.   Musculoskeletal:        General: Normal range of motion.  Skin:    General: Skin is warm.  Neurological:     Mental Status: She is alert and oriented to person, place, and time.  Psychiatric:        Behavior: Behavior normal.     Imaging: No results found.  Labs:  CBC: Recent Labs    05/08/22 1021 09/14/22 1411 10/16/22 1544 12/04/22 1224  WBC 4.3 7.4 5.1 4.7  HGB 10.6* 11.5 11.0* 11.3*  HCT 34.0 35.2 33.6* 32.5*  PLT 253 276 221 197    COAGS: No results for input(s): "INR", "APTT" in the last 8760 hours.  BMP: Recent Labs    05/08/22 1021 09/14/22 1411 10/16/22 1544 12/04/22 1224  NA 140 142 135 139  K 4.4 4.1 4.2 4.4  CL 103 108* 98 104  CO2 24 22 24 28   GLUCOSE 107* 92 146* 122*  BUN 13 13 21  24*  CALCIUM 9.7 9.0 9.5 9.9  CREATININE 0.96 1.16* 1.29* 1.16*  GFRNONAA  --   --   --  54*    LIVER FUNCTION TESTS: Recent Labs    05/08/22 1021 09/14/22 1411 10/16/22 1544 12/04/22 1224  BILITOT 0.4 <0.2 0.5 0.6  AST 16 16 17 18   ALT 14 19 14 17   ALKPHOS 94 60 81 70  PROT 6.1 6.9 6.5 7.1  ALBUMIN 3.9 3.9 4.0 4.0    TUMOR MARKERS: No results for input(s): "AFPTM", "CEA", "CA199", "CHROMGRNA" in the last 8760 hours.  Assessment and Plan:  Scheduled for left supraclavicular lymph node biopsy Worrisome for lymphoma Risks and benefits of left supraclavicular lymph node biopsy was discussed with the patient and/or patient's family including, but not limited to bleeding, infection, damage to adjacent structures or low yield requiring additional tests.  All of the questions were answered and there is agreement to proceed.  Consent signed and in chart.  Thank you for this interesting consult.  I greatly enjoyed meeting Wanda Weaver and look forward to participating in their care.  A copy of this report was sent to the requesting provider on this date.  Electronically Signed: Robet Leu,  PA-C 01/19/2023, 7:07 AM   I spent a total of  30 Minutes    in face to face in clinical consultation, greater than 50% of which was counseling/coordinating care for [address]

## 2023-01-19 NOTE — Progress Notes (Signed)
Patient was given discharge instructions. She verbalized understanding. 

## 2023-01-21 ENCOUNTER — Other Ambulatory Visit: Payer: Self-pay

## 2023-01-22 ENCOUNTER — Encounter: Payer: Self-pay | Admitting: Physician Assistant

## 2023-01-22 ENCOUNTER — Telehealth: Payer: Self-pay | Admitting: Clinical

## 2023-01-22 ENCOUNTER — Telehealth: Payer: Self-pay | Admitting: Physician Assistant

## 2023-01-22 ENCOUNTER — Other Ambulatory Visit: Payer: Medicaid Other

## 2023-01-22 ENCOUNTER — Other Ambulatory Visit: Payer: Self-pay

## 2023-01-22 DIAGNOSIS — R599 Enlarged lymph nodes, unspecified: Secondary | ICD-10-CM

## 2023-01-22 DIAGNOSIS — D869 Sarcoidosis, unspecified: Secondary | ICD-10-CM

## 2023-01-22 LAB — SURGICAL PATHOLOGY

## 2023-01-22 NOTE — Telephone Encounter (Signed)
I notified Wanda Weaver by phone regarding surgical pathology results from lymph node biopsy. 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 Previous CT chest abdomen pelvis from 12/07/22 also showed numerous small bilateral pulmonary nodules.Based on her results referral was sent to Dr. Everardo All at [facility]. All of patient's questions were answered and she expressed understanding of the plan provided.

## 2023-01-22 NOTE — Telephone Encounter (Signed)
Integrated Behavioral Health Progress Note  01/22/2023 Name: Wanda Weaver MRN: [id number] DOB: 1961/02/22 Wanda Weaver is a 62 y.o. year old female who sees [name], NP for primary care. LCSW was initially consulted to assess the patient's needs and assit with community resources.   Interpreter: No.   Interpreter Name & Language: none  Assessment: Patient experiencing financial difficulties due to low income and health concerns.  Ongoing Intervention: Called patient to check in, as she had biopsy done recently for concern for lymphoma. Patient reports she's doing ok, she's relieved to have a diagnosis. 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 Advised patient to call Social Security and advise them of a change in her medical diagnosis, as they will need to consider this information in her pending case. It does not appear that DDS has requested her records from [facility] yet though.   Patient also reported an issue in getting her medications filled. Pharmacy stated there seems to have been a change in her insurance and she was charged about $40, where she hasn't typically been charged for her meds. CSW to consult with pharmacist, Catie Clearance Coots.   Abigail Butts, LCSW [facility] [phone]

## 2023-01-25 ENCOUNTER — Other Ambulatory Visit: Payer: Self-pay

## 2023-01-26 ENCOUNTER — Other Ambulatory Visit: Payer: Self-pay

## 2023-01-28 ENCOUNTER — Telehealth: Payer: Self-pay | Admitting: Clinical

## 2023-01-28 NOTE — Telephone Encounter (Signed)
Integrated Behavioral Health Progress Note  01/28/2023 Name: Wanda Weaver MRN: [id number] DOB: 02-Apr-1961 Wanda Weaver is a 62 y.o. year old female who sees [name], NP for primary care. LCSW was initially consulted to assess the patient's needs and assit with community resources.   Interpreter: No.   Interpreter Name & Language: none  Assessment: Patient experiencing financial difficulties due to low income and health concerns.  Ongoing Intervention: Called patient and updated that the issue with her insurance at the pharmacy should be resolved. Patient did indicate that she had picked up her diabetes test strips there yesterday.  Abigail Butts, LCSW [facility] [phone]

## 2023-01-30 ENCOUNTER — Other Ambulatory Visit: Payer: Self-pay

## 2023-02-01 ENCOUNTER — Other Ambulatory Visit: Payer: Self-pay

## 2023-02-05 ENCOUNTER — Ambulatory Visit: Payer: Medicaid Other | Admitting: Orthopedic Surgery

## 2023-02-09 ENCOUNTER — Other Ambulatory Visit (INDEPENDENT_AMBULATORY_CARE_PROVIDER_SITE_OTHER): Payer: Medicaid Other | Admitting: Pharmacist

## 2023-02-09 ENCOUNTER — Other Ambulatory Visit: Payer: Self-pay

## 2023-02-09 DIAGNOSIS — I10 Essential (primary) hypertension: Secondary | ICD-10-CM

## 2023-02-09 DIAGNOSIS — Z794 Long term (current) use of insulin: Secondary | ICD-10-CM

## 2023-02-09 MED ORDER — HYDROCHLOROTHIAZIDE 25 MG PO TABS
25.0000 mg | ORAL_TABLET | Freq: Every day | ORAL | 1 refills | Status: DC
  Filled 2023-02-09: qty 90, 90d supply, fill #0
  Filled 2023-05-20: qty 90, 90d supply, fill #1

## 2023-02-09 MED ORDER — METFORMIN HCL 500 MG PO TABS
1000.0000 mg | ORAL_TABLET | Freq: Two times a day (BID) | ORAL | 3 refills | Status: DC
  Filled 2023-02-09: qty 360, 90d supply, fill #0

## 2023-02-09 MED ORDER — ROSUVASTATIN CALCIUM 40 MG PO TABS
40.0000 mg | ORAL_TABLET | Freq: Every day | ORAL | 1 refills | Status: DC
  Filled 2023-02-09: qty 90, 90d supply, fill #0
  Filled 2023-05-20: qty 90, 90d supply, fill #1

## 2023-02-09 MED ORDER — LOSARTAN POTASSIUM 25 MG PO TABS
25.0000 mg | ORAL_TABLET | Freq: Every day | ORAL | 1 refills | Status: DC
  Filled 2023-02-09 – 2023-04-29 (×2): qty 90, 90d supply, fill #0
  Filled 2023-07-23: qty 90, 90d supply, fill #1

## 2023-02-09 NOTE — Progress Notes (Signed)
02/09/2023 Name: Wanda Weaver MRN: [id number] DOB: [birth year]  Chief Complaint  Patient presents with   Medication Management   Diabetes   Hypertension    Wanda Weaver is a 62 y.o. year old female who presented for a telephone visit.   They were referred to the pharmacist by their PCP for assistance in managing diabetes and hypertension.    Subjective:  Care Team: Primary Care Provider: [name], NP ; Next Scheduled Visit: 04/18/22  Medication Access/Adherence  Current Pharmacy:  [facility] [address], [address] [place] [address] Phone: [phone] Fax: [phone]   Patient reports affordability concerns with their medications: No  Patient reports access/transportation concerns to their pharmacy: No  Patient reports adherence concerns with their medications:  No    Patient wonders if her insurance covers Ensure and if she can access a cane/mobility DME through Medicaid. Will connect with Medicaid Case Management team.   Diabetes:  Current medications: Lantus 30 units daily, metformin 1000 mg twice daily Medications tried in the past: Jardiance - consistent burning/itching while urinating; Ozempic - severe nausea/vomiting   Patient denies hypoglycemic s/sx including dizziness, shakiness, sweating.    Hypertension:  Current medications: losartan 25 mg daily, hydrochlorothiazide 25 mg daily   Patient has an upper arm BP machine but notes it is old   Hyperlipidemia/ASCVD Risk Reduction  Current lipid lowering medications: rosuvastatin 40 mg daily- reports she is having to split to swallow.    Objective:  Lab Results  Component Value Date   HGBA1C 8.3 (A) 01/15/2023    Lab Results  Component Value Date   CREATININE 1.29 (H) 01/19/2023   BUN 22 01/19/2023   NA 136 01/19/2023   K 4.1 01/19/2023   CL 98 01/19/2023   CO2 25 01/19/2023    Lab Results  Component Value Date   CHOL 221 (H)  05/08/2022   HDL 47 05/08/2022   LDLCALC 150 (H) 05/08/2022   TRIG 131 05/08/2022   CHOLHDL 4.7 (H) 05/08/2022    Medications Reviewed Today     Reviewed by Alden Hipp, RPH-CPP (Pharmacist) on 02/09/23 at 1407  Med List Status: <None>   Medication Order Taking? Sig Documenting Provider Last Dose Status Informant  Accu-Chek Softclix Lancets lancets [id number] Yes Use as instructed [name], NP Taking Active   albuterol (VENTOLIN HFA) 108 (90 Base) MCG/ACT inhaler [id number] Yes Inhale 2 puffs into the lungs every 6 (six) hours as needed. [name], NP Taking Active   blood glucose meter kit and supplies KIT [id number] Yes Use up to four times daily as directed. [name], NP Taking Active   cyclobenzaprine (FLEXERIL) 10 MG tablet [id number] Yes Take 1 tablet (10 mg total) by mouth 2 (two) times daily as needed for muscle spasms. Fayrene Helper, PA-C Taking Active   docusate sodium (COLACE) 100 MG capsule [id number]  Take 1 capsule (100 mg total) by mouth 2 (two) times daily. [name], NP  Active   Elastic Bandages & Supports (KNEE BRACE ADJUSTABLE HINGED) MISC [id number]  1 application by Does not apply route daily. Barbette Merino, NP  Active   glucose blood (ACCU-CHEK GUIDE) test strip [id number]  Use as instructed [name], NP  Active   hydrochlorothiazide (HYDRODIURIL) 25 MG tablet [id number] Yes Take 1 tablet (25 mg total) by mouth daily. [name], NP Taking Active   insulin glargine (LANTUS) 100 UNIT/ML injection [id number] Yes Inject  0.34 mLs (34 Units total) into the skin daily. [name], NP Taking Active   Insulin Syringe-Needle U-100 (INSULIN SYRINGE .5CC/31GX5/16") 31G X 5/16" 0.5 ML MISC [id number] No Use to inject insulin daily  Patient not taking: Reported on 02/09/2023   [name], NP Not Taking Active   losartan (COZAAR) 25 MG tablet [id number] Yes Take 1 tablet (25 mg total) by mouth daily. [name], NP Taking  Active   metFORMIN (GLUCOPHAGE) 500 MG tablet [id number] Yes Take 2 tablets (1,000 mg total) by mouth 2 (two) times daily with a meal. [name], NP Taking Active   metoprolol succinate (TOPROL-XL) 50 MG 24 hr tablet [id number] Yes Take 1 tablet (50 mg total) by mouth daily. Take with or immediately following a meal. [name], NP Taking Active   omeprazole (PRILOSEC) 20 MG capsule [id number] Yes Take 1 capsule (20 mg total) by mouth daily. [name], NP Taking Active   ondansetron (ZOFRAN) 4 MG tablet [id number]  Take 1 tablet (4 mg total) by mouth every 8 (eight) hours as needed for nausea or vomiting. Donell Beers, FNP  Active            Med Note [name]   Wed Apr 22, 2022  8:57 AM) prn  rosuvastatin (CRESTOR) 40 MG tablet [id number] Yes Take 1 tablet (40 mg total) by mouth daily. [name], NP Taking Active               Assessment/Plan:   Diabetes: - Currently uncontrolled. Now that she only has Medicaid, will be able to access coverage for CGM. PA completed for [facility] 3 plus today. Follow up in office scheduled for [facility] 3 education.  - Reviewed long term cardiovascular and renal outcomes of uncontrolled blood sugar - Reviewed goal A1c, goal fasting, and goal 2 hour post prandial glucose - Recommend to continue current regimen at this time.  - Recommend to check glucose using CGM.   Hypertension: - Currently uncontrolled at recent office visits - Reviewed long term cardiovascular and renal outcomes of uncontrolled blood pressure - Reviewed appropriate blood pressure monitoring technique and reviewed goal blood pressure. Recommended to check home blood pressure and heart rate periodically, document, and provide at future visits. Will order home cuff through [facility] Delivered to be billed under Medicaid - Recommend to continue current regimen for now until home readings obtained.   Hyperlipidemia/ASCVD Risk Reduction: - Currently  uncontrolled but lipids have not been checked since medication access established.  - Discussed that it is ok to split/crush rosuvastatin.  - Recommend to continue current regimen. Will check lipids with next in person appointment.     Follow Up Plan: in person in 2 weeks  Catie TClearance Coots, PharmD, BCACP, CPP Clinical Pharmacist [facility] [phone]

## 2023-02-09 NOTE — Patient Instructions (Addendum)
Wanda Weaver,   It was great talking to you today!  I'll ask our Medicaid Care Management team to outreach you to help support you. They can help answer the questions about Ensure, mobility aids, and financial difficulties.   Bring the [facility] 3 plus with you to the office when you see me in 2 weeks.   You can continue to split or crush metformin and rosuvastatin.   Please reach out with any questions!  Catie Eppie Gibson, PharmD, BCACP, CPP Clinical Pharmacist [facility] [phone]

## 2023-02-12 ENCOUNTER — Other Ambulatory Visit: Payer: Self-pay

## 2023-02-12 ENCOUNTER — Other Ambulatory Visit: Payer: Self-pay | Admitting: Obstetrics and Gynecology

## 2023-02-12 NOTE — Patient Instructions (Signed)
Hi Wanda Weaver you for speaking with me this afternoon, have a great rest of the day and weekend!  Wanda Weaver was given information about Medicaid Managed Care team care coordination services as a part of their [facility] Community Plan Medicaid benefit. Wanda Weaver verbally consented to engagement with the [facility] Managed Care team.   If you are experiencing a medical emergency, please call 911 or report to your local emergency department or urgent care.   If you have a non-emergency medical problem during routine business hours, please contact your provider's office and ask to speak with a nurse.   For questions related to your [facility], please call: [phone] or visit the homepage here: [email or web address]  If you would like to schedule transportation through your [facility], please call the following number at least 2 days in advance of your appointment: [phone]   Rides for urgent appointments can also be made after hours by calling Member Services.  Call the Behavioral Health Crisis Line at [phone], at any time, 24 hours a day, 7 days a week. If you are in danger or need immediate medical attention call 911.  If you would like help to quit smoking, call 1-800-QUIT-NOW (253-159-1242) OR Espaol: 1-855-Djelo-Ya (4-034-742-5956) o para ms informacin haga clic aqu or Text READY to 387-564 to register via text  Wanda Weaver - following are the goals we discussed in your visit today:   Goals Addressed             This Visit's Progress    Establish Plan of Care for Chronic Disease Management Needs       Timeframe:  Long-Range Goal Priority:  High Start Date:     02/12/23                        Expected End Date:     ongoing                  Follow Up Date 03/16/23    - schedule appointment for vaccines needed due to my age or health - schedule recommended  health tests (blood work, mammogram, colonoscopy, pap test) - schedule and keep appointment for annual check-up    Why is this important?   Screening tests can find diseases early when they are easier to treat.  Your doctor or nurse will talk with you about which tests are important for you.  Getting shots for common diseases like the flu and shingles will help prevent them.     Patient verbalizes understanding of instructions and care plan provided today and agrees to view in MyChart. Active MyChart status and patient understanding of how to access instructions and care plan via MyChart confirmed with patient.     The Managed Medicaid care management team will reach out to the patient again over the next 30 business  days.  The  Patient  has been provided with contact information for the Managed Medicaid care management team and has been advised to call with any health related questions or concerns.   Kathi Der RN, BSN [place]  [facility] Care Management Coordinator - Managed Medicaid High Risk [phone]   Following is a copy of your plan of care:  Care Plan : RN Care Manager Plan of Care  Updates made by Danie Chandler, RN since 02/12/2023 12:00 AM  Completed 02/12/2023   Care Plan : Chronic Disease Management and Care  Coordination Needs  Updates made by Danie Chandler, RN since 02/12/2023 12:00 AM  Completed 02/12/2023   Care Plan : General Plan of Care (Adult)  Updates made by Danie Chandler, RN since 02/12/2023 12:00 AM     Problem: Health Promotion or Disease Self-Management (General Plan of Care)      Long-Range Goal: Chronic Disease Management and Care Coordination Needs   Start Date: 02/12/2023  Expected End Date: 05/15/2023  Priority: High  Note:   Current Barriers:  Knowledge Deficits related to plan of care for management of HTN, DM, osteoarthritis, obesity, anxiety, constipation, sarcoidosis Care Coordination needs related to HTN, DM,  osteoarthritis, obesity, anxiety, constipation, sarcoidosis Chronic Disease Management support and education needs related to HTN, DM, osteoarthritis, obesity, anxiety, constipation, sardoiosis Financial Constraints  Difficulty obtaining medications  RNCM Clinical Goal(s):  verbalize understanding of plan for management of HTN, DM, osteoarthritis, obesity, anxiety, constipation, sarcoidosis as evidenced by patient report verbalize basic understanding of  HTN, DM, osteoarthritis, obesity, anxiety, constipation, sarcoidosis disease process and self health management plan as evidenced by patient report. take all medications exactly as prescribed and will call provider for medication related questions as evidenced by patient report demonstrate understanding of rationale for each prescribed medication as evidenced by patient report attend all scheduled medical appointments as evidenced by patient report. demonstrate ongoing  adherence to prescribed treatment plan for HTN, DM, osteoarthritis, obesity, anxiety, constipation, sarcoidosis   as evidenced by patient report. continue to work with RN Care Manager to address care management and care coordination needs related to HTN, DM, osteoarthritis, obesity, anxiety, constipation, sarcoidosis as evidenced by adherence to CM Team Scheduled appointments work with pharmacist to address medication needs related to  HTN, DM, osteoarthritis, obesity, anxiety, constipation, sarcoidosis  as evidenced by review or EMR and patient or pharmacist report work with Child psychotherapist to address resources needed related to the management of financial constraints  related to the management of HTN, DM, osteoarthritis, obesity, anxiety, constipation, sarcoidosis as evidenced by review of EMR and patient or Child psychotherapist report through collaboration with Medical illustrator, provider, and care team.   Interventions: Evaluation of current treatment plan related to  self management and  patient's adherence to plan as established by provider   Diabetes Interventions:  (Status:  New goal.) Long Term Goal Assessed patient's understanding of A1c goal: <7% Reviewed medications with patient and discussed importance of medication adherence Counseled on importance of regular laboratory monitoring as prescribed Discussed plans with patient for ongoing care management follow up and provided patient with direct contact information for care management team Reviewed scheduled/upcoming provider appointments Advised patient, providing education and rationale, to check cbg as directed  and record, calling provider  for findings outside established parameters Referral made to pharmacy team for assistance with medications  Referral made to social work team for assistance with resources  Review of patient status, including review of consultants reports, relevant laboratory and other test results, and medications completed Assessed social determinant of health barriers Lab Results  Component Value Date   HGBA1C 8.3 (A) 01/15/2023   Hypertension Interventions:  (Status:  New goal.) Long Term Goal Last practice recorded BP readings:  BP Readings from Last 3 Encounters:  01/19/23 (!) 147/69  01/15/23 (!) 151/65  02/12/23                132/63   Most recent eGFR/CrCl:  Lab Results  Component Value Date   EGFR 47 (L) 10/16/2022  No components found for: "CRCL"  Evaluation of current treatment plan related to hypertension self management and patient's adherence to plan as established by provider Reviewed medications with patient and discussed importance of compliance Discussed plans with patient for ongoing care management follow up and provided patient with direct contact information for care management team Advised patient, providing education and rationale, to monitor blood pressure daily and record, calling PCP for findings outside established parameters Reviewed scheduled/upcoming  provider appointments including:  Assessed social determinant of health barriers  SDOH Barriers (Status:  New goal.) Long Term Goal Patient interviewed and SDOH assessment performed        SDOH Interventions    Flowsheet Row Patient Outreach Telephone from 02/12/2023 in [place] [facility]UL[facility]ALTH DEPARTMENT Office Visit from 07/29/2022 in [facility]  SDOH Interventions    Food Insecurity Interventions -- Other (Comment)  Utilities Interventions Other (Comment)  [patient has set up a payment plan] --  Financial Strain Interventions -- Other (Comment)  Health Literacy Interventions Intervention Not Indicated --      Patient interviewed and appropriate assessments performed Discussed plans with patient for ongoing care management follow up and provided patient with direct contact information for care management team Advised patient to f/u with provider regarding cane.  Patient Goals/Self-Care Activities: Financial trader of TOC call rescheduling needs Take all medications as prescribed Attend all scheduled provider appointments Call pharmacy for medication refills 3-7 days in advance of running out of medications Perform all self care activities independently  Perform IADL's (shopping, preparing meals, housekeeping, managing finances) independently Call provider office for new concerns or questions  Work with the social worker to address care coordination needs and will continue to work with the clinical team to address health care and disease management related needs  Follow Up Plan:  The patient has been provided with contact information for the care management team and has been advised to call with any health related questions or concerns.  The care management team will reach out to the patient again over the next 30 business  days.

## 2023-02-12 NOTE — Patient Outreach (Signed)
Medicaid Managed Care   Nurse Care Manager Note  02/12/2023 Name:  Wanda Weaver MRN:  [id number] DOB:  Dec 06, 1960  Wanda Weaver is an 62 y.o. year old female who is a primary patient of [name], NP.  The [facility] Managed Care Coordination team was consulted for assistance with:    Chronic healthcare management needs,  HTN, DM, osteoarthritis, obesity, anxiety, constipation, sarcoidosis, SDOH  Wanda Weaver was given information about Medicaid Managed Care Coordination team services today. Wanda Weaver Patient agreed to services and verbal consent obtained.  Engaged with patient by telephone for follow up visit in response to provider referral for case management and/or care coordination services.   Assessments/Interventions:  Review of past medical history, allergies, medications, health status, including review of consultants reports, laboratory and other test data, was performed as part of comprehensive evaluation and provision of chronic care management services.  SDOH (Social Determinants of Health) assessments and interventions performed: SDOH Interventions    Flowsheet Row Patient Outreach Telephone from 02/12/2023 in [place] POPULATION HEALTH DEPARTMENT Office Visit from 07/29/2022 in [facility]  SDOH Interventions    Food Insecurity Interventions -- Other (Comment)  Utilities Interventions Other (Comment)  [patient has set up a payment plan] --  Financial Strain Interventions -- Other (Comment)  Health Literacy Interventions Intervention Not Indicated --     Care Plan Allergies  Allergen Reactions   Ibuprofen Palpitations   Medications Reviewed Today     Reviewed by Danie Chandler, RN (Registered Nurse) on 02/12/23 at 1333  Med List Status: <None>   Medication Order Taking? Sig Documenting Provider Last Dose Status Informant  Accu-Chek Softclix Lancets lancets [id number] No Use as instructed [name], NP Taking Active   albuterol (VENTOLIN HFA) 108  (90 Base) MCG/ACT inhaler [id number] No Inhale 2 puffs into the lungs every 6 (six) hours as needed. [name], NP Taking Active   blood glucose meter kit and supplies KIT [id number] No Use up to four times daily as directed. [name], NP Taking Active   cyclobenzaprine (FLEXERIL) 10 MG tablet [id number] No Take 1 tablet (10 mg total) by mouth 2 (two) times daily as needed for muscle spasms. Fayrene Helper, PA-C Taking Active   docusate sodium (COLACE) 100 MG capsule [id number] No Take 1 capsule (100 mg total) by mouth 2 (two) times daily. [name], NP More than a month Active   Elastic Bandages & Supports (KNEE BRACE ADJUSTABLE HINGED) MISC [id number] No 1 application by Does not apply route daily. Barbette Merino, NP Taking Active   glucose blood (ACCU-CHEK GUIDE) test strip [id number] No Use as instructed [name], NP Taking Active   hydrochlorothiazide (HYDRODIURIL) 25 MG tablet [id number]  Take 1 tablet (25 mg total) by mouth daily. [name], NP  Active   insulin glargine (LANTUS) 100 UNIT/ML injection [id number] No Inject 0.34 mLs (34 Units total) into the skin daily. [name], NP Taking Active   Insulin Syringe-Needle U-100 (INSULIN SYRINGE .5CC/31GX5/16") 31G X 5/16" 0.5 ML MISC [id number] No Use to inject insulin daily  Patient not taking: Reported on 02/09/2023   [name], NP Not Taking Active   losartan (COZAAR) 25 MG tablet [id number]  Take 1 tablet (25 mg total) by mouth daily. [name], NP  Active   metFORMIN (GLUCOPHAGE) 500 MG tablet [id number]  Take 2 tablets (1,000 mg total) by mouth 2 (two) times daily with a meal. Angus Seller  S, NP  Active   metoprolol succinate (TOPROL-XL) 50 MG 24 hr tablet [id number] No Take 1 tablet (50 mg total) by mouth daily. Take with or immediately following a meal. [name], NP Taking Active   omeprazole (PRILOSEC) 20 MG capsule [id number] No Take 1 capsule (20 mg total) by mouth daily. [name], NP Taking Active   ondansetron (ZOFRAN) 4 MG tablet [id number] No Take 1 tablet (4 mg total) by mouth every 8 (eight) hours as needed for nausea or vomiting. Donell Beers, FNP Past Week Active            Med Note [name]   Wed Apr 22, 2022  8:57 AM) prn  rosuvastatin (CRESTOR) 40 MG tablet [id number]  Take 1 tablet (40 mg total) by mouth daily. [name], NP  Active            Patient Active Problem List   Diagnosis Date Noted   Paresthesia 10/22/2022   Gait abnormality 10/22/2022   Near syncope 09/14/2022   Adhesive capsulitis of left shoulder 07/24/2022   Constipation 05/25/2022   Osteoarthritis of right shoulder 04/22/2022   Bilateral lower extremity pain 08/20/2019   Bilateral lower extremity edema 08/20/2019   Hemoglobin A1C greater than 9%, indicating poor diabetic control 02/21/2019   Weight loss 02/21/2019   Muscle strain of wrist, left, initial encounter 09/22/2018   Hyperglycemia 08/15/2018   Essential hypertension 04/25/2018   Class 1 obesity due to excess calories with serious comorbidity in adult 04/25/2018   Right foot pain 04/25/2018   Anxiety 04/25/2018   Type 2 diabetes mellitus with complication, with long-term current use of insulin (HCC) 08/06/2017   Conditions to be addressed/monitored per PCP order:Chronic healthcare management needs, HTN, DM, anxiety, sarcoidosis, osteoarthritis, obesity, constipation  Care Plan : RN Care Manager Plan of Care  Updates made by Danie Chandler, RN since 02/12/2023 12:00 AM  Completed 02/12/2023   Care Plan : Chronic Disease Management and Care Coordination Needs  Updates made by Danie Chandler, RN since 02/12/2023 12:00 AM  Completed 02/12/2023   Care Plan : General Plan of Care (Adult)  Updates made by Danie Chandler, RN since 02/12/2023 12:00 AM     Problem: Health Promotion or Disease Self-Management (General Plan of Care)      Long-Range Goal: Chronic Disease Management and Care  Coordination Needs   Start Date: 02/12/2023  Expected End Date: 05/15/2023  Priority: High  Note:   Current Barriers:  Knowledge Deficits related to plan of care for management of HTN, DM, osteoarthritis, obesity, anxiety, constipation, sarcoidosis Care Coordination needs related to HTN, DM, osteoarthritis, obesity, anxiety, constipation, sarcoidosis Chronic Disease Management support and education needs related to HTN, DM, osteoarthritis, obesity, anxiety, constipation, sardoiosis Financial Constraints  Difficulty obtaining medications  RNCM Clinical Goal(s):  verbalize understanding of plan for management of HTN, DM, osteoarthritis, obesity, anxiety, constipation, sarcoidosis as evidenced by patient report verbalize basic understanding of  HTN, DM, osteoarthritis, obesity, anxiety, constipation, sarcoidosis disease process and self health management plan as evidenced by patient report. take all medications exactly as prescribed and will call provider for medication related questions as evidenced by patient report demonstrate understanding of rationale for each prescribed medication as evidenced by patient report attend all scheduled medical appointments as evidenced by patient report. demonstrate ongoing  adherence to prescribed treatment plan for HTN, DM, osteoarthritis, obesity, anxiety, constipation, sarcoidosis   as evidenced by patient report. continue to work with [employer]  Care Manager to address care management and care coordination needs related to HTN, DM, osteoarthritis, obesity, anxiety, constipation, sarcoidosis as evidenced by adherence to CM Team Scheduled appointments work with pharmacist to address medication needs related to  HTN, DM, osteoarthritis, obesity, anxiety, constipation, sarcoidosis  as evidenced by review or EMR and patient or pharmacist report work with Child psychotherapist to address resources needed related to the management of financial constraints  related to the management  of HTN, DM, osteoarthritis, obesity, anxiety, constipation, sarcoidosis as evidenced by review of EMR and patient or Child psychotherapist report through collaboration with Medical illustrator, provider, and care team.   Interventions: Evaluation of current treatment plan related to  self management and patient's adherence to plan as established by provider  Diabetes Interventions:  (Status:  New goal.) Long Term Goal Assessed patient's understanding of A1c goal: <7% Reviewed medications with patient and discussed importance of medication adherence Counseled on importance of regular laboratory monitoring as prescribed Discussed plans with patient for ongoing care management follow up and provided patient with direct contact information for care management team Reviewed scheduled/upcoming provider appointments Advised patient, providing education and rationale, to check cbg as directed  and record, calling provider  for findings outside established parameters Referral made to pharmacy team for assistance with medications  Referral made to social work team for assistance with resources  Review of patient status, including review of consultants reports, relevant laboratory and other test results, and medications completed Assessed social determinant of health barriers Lab Results  Component Value Date   HGBA1C 8.3 (A) 01/15/2023   Hypertension Interventions:  (Status:  New goal.) Long Term Goal Last practice recorded BP readings:  BP Readings from Last 3 Encounters:  01/19/23 (!) 147/69  01/15/23 (!) 151/65  02/12/23                132/63   Most recent eGFR/CrCl:  Lab Results  Component Value Date   EGFR 47 (L) 10/16/2022    No components found for: "CRCL"  Evaluation of current treatment plan related to hypertension self management and patient's adherence to plan as established by provider Reviewed medications with patient and discussed importance of compliance Discussed plans with patient for  ongoing care management follow up and provided patient with direct contact information for care management team Advised patient, providing education and rationale, to monitor blood pressure daily and record, calling PCP for findings outside established parameters Reviewed scheduled/upcoming provider appointments including:  Assessed social determinant of health barriers  SDOH Barriers (Status:  New goal.) Long Term Goal Patient interviewed and SDOH assessment performed        SDOH Interventions    Flowsheet Row Patient Outreach Telephone from 02/12/2023 in [place] POPULATION HEALTH DEPARTMENT Office Visit from 07/29/2022 in [facility]  SDOH Interventions    Food Insecurity Interventions -- Other (Comment)  Utilities Interventions Other (Comment)  [patient has set up a payment plan] --  Financial Strain Interventions -- Other (Comment)  Health Literacy Interventions Intervention Not Indicated --      Patient interviewed and appropriate assessments performed Discussed plans with patient for ongoing care management follow up and provided patient with direct contact information for care management team Advised patient to f/u with provider regarding cane.  Patient Goals/Self-Care Activities: Financial trader of TOC call rescheduling needs Take all medications as prescribed Attend all scheduled provider appointments Call pharmacy for medication refills 3-7 days in advance of running out of medications Perform  all self care activities independently  Perform IADL's (shopping, preparing meals, housekeeping, managing finances) independently Call provider office for new concerns or questions  Work with the social worker to address care coordination needs and will continue to work with the clinical team to address health care and disease management related needs  Follow Up Plan:  The patient has been provided with contact information for the care management team and  has been advised to call with any health related questions or concerns.  The care management team will reach out to the patient again over the next 30 business  days.    Follow Up:  Patient agrees to Care Plan and Follow-up.  Plan: The Managed Medicaid care management team will reach out to the patient again over the next 30 buisness days. and The  Patient has been provided with contact information for the Managed Medicaid care management team and has been advised to call with any health related questions or concerns.  Date/time of next scheduled RN care management/care coordination outreach: 03/16/23 at 230

## 2023-02-15 ENCOUNTER — Other Ambulatory Visit: Payer: Self-pay

## 2023-02-16 ENCOUNTER — Other Ambulatory Visit: Payer: Self-pay

## 2023-02-16 NOTE — Patient Outreach (Signed)
  Medicaid Managed Care Social Work Note  02/16/2023 Name:  Wanda Weaver MRN:  [id number] DOB:  05/25/1960  Wanda Weaver is an 62 y.o. year old female who is a primary patient of [name], NP.  The Medicaid Managed Care Coordination team was consulted for assistance with:  Community Resources   [name] was given information about Medicaid Managed Care Coordination team services today. [name] Patient agreed to services and verbal consent obtained.  Engaged with patient  for by telephone forinitial visit in response to referral for case management and/or care coordination services.   Assessments/Interventions:  Review of past medical history, allergies, medications, health status, including review of consultants reports, laboratory and other test data, was performed as part of comprehensive evaluation and provision of chronic care management services.  SDOH: (Social Determinant of Health) assessments and interventions performed: SDOH Interventions    Flowsheet Row Patient Outreach Telephone from 02/12/2023 in [facility] POPULATION HEALTH DEPARTMENT Office Visit from 07/29/2022 in [facility]  SDOH Interventions    Food Insecurity Interventions -- Other (Comment)  Utilities Interventions Other (Comment)  [patient has set up a payment plan] --  Financial Strain Interventions -- Other (Comment)  Health Literacy Interventions Intervention Not Indicated --     BSW completed a telephone outreach with patient, she states she has not worked since June and applied for disability in July. Her SO is currently working 2 jobs to pay the bills. Patient states she does receive foodstamps of 291 monthly. She does not have an eviction notice or cut off notice for utilities. BSW and patient agreed for resources to be mailed for rent and utilities.  Advanced Directives Status:  Not addressed in this encounter.  Care Plan                  Allergies  Allergen Reactions   Ibuprofen Palpitations    Medications Reviewed Today   Medications were not reviewed in this encounter     Patient Active Problem List   Diagnosis Date Noted   Paresthesia 10/22/2022   Gait abnormality 10/22/2022   Near syncope 09/14/2022   Adhesive capsulitis of left shoulder 07/24/2022   Constipation 05/25/2022   Osteoarthritis of right shoulder 04/22/2022   Bilateral lower extremity pain 08/20/2019   Bilateral lower extremity edema 08/20/2019   Hemoglobin A1C greater than 9%, indicating poor diabetic control 02/21/2019   Weight loss 02/21/2019   Muscle strain of wrist, left, initial encounter 09/22/2018   Hyperglycemia 08/15/2018   Essential hypertension 04/25/2018   Class 1 obesity due to excess calories with serious comorbidity in adult 04/25/2018   Right foot pain 04/25/2018   Anxiety 04/25/2018   Type 2 diabetes mellitus with complication, with long-term current use of insulin (HCC) 08/06/2017    Conditions to be addressed/monitored per PCP order:   community resources  There are no care plans that you recently modified to display for this patient.   Follow up:  Patient agrees to Care Plan and Follow-up.  Plan: The Managed Medicaid care management team will reach out to the patient again over the next 30 days.  Date/time of next scheduled Social Work care management/care coordination outreach:  03/18/23  Gus Puma, Kenard Gower, [facility]  Managed [facility] Social Worker [phone]

## 2023-02-16 NOTE — Patient Instructions (Signed)
Visit Information  [name] was given information about Medicaid Managed Care team care coordination services as a part of their [facility] Community Plan Medicaid benefit. Wanda Weaver verbally consented. to engagement with the [facility] Managed Care team.   If you are experiencing a medical emergency, please call 911 or report to your local emergency department or urgent care.   If you have a non-emergency medical problem during routine business hours, please contact your provider's office and ask to speak with a nurse.   For questions related to your [facility], please call: [phone] or visit the homepage here: [email or web address]  If you would like to schedule transportation through your [facility], please call the following number at least 2 days in advance of your appointment: [phone]   Rides for urgent appointments can also be made after hours by calling Member Services.  Call the Behavioral Health Crisis Line at [phone], at any time, 24 hours a day, 7 days a week. If you are in danger or need immediate medical attention call 911.  If you would like help to quit smoking, call 1-800-QUIT-NOW (8046468851) OR Espaol: 1-855-Djelo-Ya (3-664-403-4742) o para ms informacin haga clic aqu or Text READY to 595-638 to register via text  [name] - following are the goals we discussed in your visit today:   Goals Addressed   None      Social Worker will follow up in 30 days.   Wanda Weaver, Wanda Weaver, MHA [facility]  Managed Medicaid Social Worker [phone]   Following is a copy of your plan of care:  There are no care plans that you recently modified to display for this patient.

## 2023-02-19 ENCOUNTER — Other Ambulatory Visit: Payer: Self-pay

## 2023-02-23 ENCOUNTER — Telehealth: Payer: Self-pay | Admitting: Clinical

## 2023-02-23 ENCOUNTER — Other Ambulatory Visit: Payer: Self-pay

## 2023-02-23 NOTE — Telephone Encounter (Signed)
Integrated Behavioral Health Progress Note  02/23/2023 Name: Wanda Weaver MRN: [id number] DOB: [birth year] [name] is a 62 y.o. year old female who sees [name], NP for primary care. LCSW was initially consulted to assess the patient's needs and assit with community resources.   Interpreter: No.   Interpreter Name & Language: none  Assessment: Patient experiencing financial difficulties due to low income and health concerns.  Ongoing Intervention: Called patient and advised her that CSW is leaving the practice at the end of the month. Noted that patient was referred to Managed Medicaid social work services and they have reached out to patient. Patient reports no needs currently. She is just continuing to navigate her ongoing health issues.   Abigail Butts, LCSW Patient Care Center [facility] [phone]

## 2023-03-04 ENCOUNTER — Ambulatory Visit: Payer: Self-pay

## 2023-03-04 ENCOUNTER — Other Ambulatory Visit: Payer: Self-pay

## 2023-03-09 ENCOUNTER — Other Ambulatory Visit: Payer: Self-pay

## 2023-03-10 ENCOUNTER — Other Ambulatory Visit: Payer: Self-pay

## 2023-03-15 ENCOUNTER — Other Ambulatory Visit: Payer: Self-pay

## 2023-03-16 ENCOUNTER — Other Ambulatory Visit: Payer: Self-pay | Admitting: Obstetrics and Gynecology

## 2023-03-16 NOTE — Patient Instructions (Signed)
Hi Ms. Super, hope you are doing okay,  as a part of your Medicaid benefit, you are eligible for care management and care coordination services at no cost or copay. I was unable to reach you by phone today but would be happy to help you with your health related needs. Please feel free to call me at [phone].  A member of the Managed Medicaid care management team will reach out to you again over the next 30 business  days.   Kathi Der RN, BSN [place]  [employer], production - Managed Medicaid High Risk [phone]

## 2023-03-16 NOTE — Patient Outreach (Signed)
  Medicaid Managed Care   Unsuccessful Attempt Note   03/16/2023 Name: Wanda Weaver MRN: [id number] DOB: March 18, 1961  Referred by: [name], NP Reason for referral : High Risk Managed Medicaid (Unsuccessful telephone outreach)  An unsuccessful telephone outreach was attempted today. The patient was referred to the case management team for assistance with care management and care coordination.    Follow Up Plan: The Managed Medicaid care management team will reach out to the patient again over the next 30 business  days. and The  Patient has been provided with contact information for the Managed Medicaid care management team and has been advised to call with any health related questions or concerns.   Kathi Der RN, BSN [place]  [employer] - Managed Medicaid High Risk [phone]

## 2023-03-18 ENCOUNTER — Other Ambulatory Visit: Payer: Self-pay

## 2023-03-18 ENCOUNTER — Other Ambulatory Visit: Payer: Self-pay | Admitting: Nurse Practitioner

## 2023-03-18 ENCOUNTER — Telehealth: Payer: Self-pay | Admitting: Nurse Practitioner

## 2023-03-18 MED ORDER — GLUCOSE BLOOD VI STRP
ORAL_STRIP | 12 refills | Status: DC
  Filled 2023-03-18: qty 100, 25d supply, fill #0
  Filled 2023-04-11: qty 100, 25d supply, fill #1
  Filled 2023-05-06: qty 100, 25d supply, fill #2
  Filled 2023-07-23: qty 100, 25d supply, fill #3
  Filled 2023-08-17: qty 100, 25d supply, fill #4
  Filled 2023-10-05: qty 100, 25d supply, fill #5
  Filled 2023-11-03: qty 100, 25d supply, fill #6
  Filled 2023-12-08: qty 100, 25d supply, fill #7
  Filled 2023-12-30: qty 100, 25d supply, fill #8
  Filled 2024-02-13: qty 100, 25d supply, fill #9

## 2023-03-18 NOTE — Patient Instructions (Signed)
  Medicaid Managed Care   Unsuccessful Outreach Note  03/18/2023 Name: Wanda Weaver MRN: [id number] DOB: [birth year]  Referred by: [name], NP Reason for referral : High Risk Managed Medicaid (MM social work unsuccessful telephone outreach )   An unsuccessful telephone outreach was attempted today. The patient was referred to the case management team for assistance with care management and care coordination.   Follow Up Plan: A HIPAA compliant phone message was left for the patient providing contact information and requesting a return call.   Abelino Derrick, MHA [facility]  Managed [facility] Social Worker [phone]

## 2023-03-18 NOTE — Patient Outreach (Signed)
  Medicaid Managed Care   Unsuccessful Outreach Note  03/18/2023 Name: Nali Farella MRN: [id number] DOB: [birth year]  Referred by: [name], NP Reason for referral : High Risk Managed Medicaid (MM social work unsuccessful telephone outreach )   An unsuccessful telephone outreach was attempted today. The patient was referred to the case management team for assistance with care management and care coordination.   Follow Up Plan: A HIPAA compliant phone message was left for the patient providing contact information and requesting a return call.   Abelino Derrick, MHA [facility]  Managed [facility] Social Worker [phone]

## 2023-03-18 NOTE — Telephone Encounter (Signed)
Copied from CRM [id number]. Topic: Clinical - Medication Question >> Mar 18, 2023  4:44 PM Joanette Gula wrote: Reason for CRM: Pt. Needs test strips refilled. Please call [phone]  to reach her if needed.

## 2023-03-19 ENCOUNTER — Other Ambulatory Visit: Payer: Self-pay

## 2023-03-22 ENCOUNTER — Other Ambulatory Visit: Payer: Self-pay

## 2023-03-23 ENCOUNTER — Ambulatory Visit: Payer: Self-pay | Admitting: Obstetrics and Gynecology

## 2023-03-25 ENCOUNTER — Other Ambulatory Visit: Payer: Self-pay

## 2023-04-01 ENCOUNTER — Other Ambulatory Visit: Payer: Self-pay | Admitting: Obstetrics and Gynecology

## 2023-04-01 NOTE — Patient Instructions (Signed)
Hi Wanda Weaver, thanks for speaking with me-have a nice afternoon!  Wanda Weaver was given information about Medicaid Managed Care team care coordination services as a part of their [facility] Community Plan Medicaid benefit. Wanda Weaver verbally consented to engagement with the [facility] Managed Care team.   If you are experiencing a medical emergency, please call 911 or report to your local emergency department or urgent care.   If you have a non-emergency medical problem during routine business hours, please contact your provider's office and ask to speak with a nurse.   For questions related to your [facility], please call: [phone] or visit the homepage here: [email or web address]  If you would like to schedule transportation through your [facility], please call the following number at least 2 days in advance of your appointment: [phone]   Rides for urgent appointments can also be made after hours by calling Member Services.  Call the Behavioral Health Crisis Line at [phone], at any time, 24 hours a day, 7 days a week. If you are in danger or need immediate medical attention call 911.  If you would like help to quit smoking, call 1-800-QUIT-NOW (3053227081) OR Espaol: 1-855-Djelo-Ya (7-425-956-3875) o para ms informacin haga clic aqu or Text READY to 643-329 to register via text  Wanda Weaver - following are the goals we discussed in your visit today:   Goals Addressed             This Visit's Progress    Establish Plan of Care for Chronic Disease Management Needs       Timeframe:  Long-Range Goal Priority:  High Start Date:     02/12/23                        Expected End Date:     ongoing                  Follow Up Date 04/30/23   - schedule appointment for vaccines needed due to my age or health - schedule recommended health tests (blood work, mammogram,  colonoscopy, pap test) - schedule and keep appointment for annual check-up    Why is this important?   Screening tests can find diseases early when they are easier to treat.  Your doctor or nurse will talk with you about which tests are important for you.  Getting shots for common diseases like the flu and shingles will help prevent them.   04/01/23:  patient has upcoming appts with PCP and PULM   Patient verbalizes understanding of instructions and care plan provided today and agrees to view in MyChart. Active MyChart status and patient understanding of how to access instructions and care plan via MyChart confirmed with patient.     The Managed Medicaid care management team will reach out to the patient again over the next 30 business  days.  The  Patient  has been provided with contact information for the Managed Medicaid care management team and has been advised to call with any health related questions or concerns.   Kathi Der RN, BSN [place]  [facility] Care Management Coordinator - Managed Medicaid High Risk [phone]   Following is a copy of your plan of care:  Care Plan : General Plan of Care (Adult)  Updates made by Danie Chandler, RN since 04/01/2023 12:00 AM     Problem: Health Promotion or Disease Self-Management (General Plan of  Care)      Long-Range Goal: Chronic Disease Management and Care Coordination Needs   Start Date: 02/12/2023  Expected End Date: 05/15/2023  Priority: High  Note:   Current Barriers:  Knowledge Deficits related to plan of care for management of HTN, DM, osteoarthritis, obesity, anxiety, constipation, sarcoidosis Care Coordination needs related to HTN, DM, osteoarthritis, obesity, anxiety, constipation, sarcoidosis Chronic Disease Management support and education needs related to HTN, DM, osteoarthritis, obesity, anxiety, constipation, sardoiosis Financial Constraints  Difficulty obtaining medications 04/01/23:  patient  states she received rental and utility resources-BSW f/u rescheduled as last appt missed.  Declines LCSW today for anxiety.  Blood sugar average 190.  Unsure about following up with [facility].  RNCM Clinical Goal(s):  verbalize understanding of plan for management of HTN, DM, osteoarthritis, obesity, anxiety, constipation, sarcoidosis as evidenced by patient report verbalize basic understanding of  HTN, DM, osteoarthritis, obesity, anxiety, constipation, sarcoidosis disease process and self health management plan as evidenced by patient report. take all medications exactly as prescribed and will call provider for medication related questions as evidenced by patient report demonstrate understanding of rationale for each prescribed medication as evidenced by patient report attend all scheduled medical appointments as evidenced by patient report. demonstrate ongoing  adherence to prescribed treatment plan for HTN, DM, osteoarthritis, obesity, anxiety, constipation, sarcoidosis   as evidenced by patient report. continue to work with RN Care Manager to address care management and care coordination needs related to HTN, DM, osteoarthritis, obesity, anxiety, constipation, sarcoidosis as evidenced by adherence to CM Team Scheduled appointments work with pharmacist to address medication needs related to  HTN, DM, osteoarthritis, obesity, anxiety, constipation, sarcoidosis  as evidenced by review or EMR and patient or pharmacist report work with Child psychotherapist to address resources needed related to the management of financial constraints  related to the management of HTN, DM, osteoarthritis, obesity, anxiety, constipation, sarcoidosis as evidenced by review of EMR and patient or Child psychotherapist report through collaboration with Medical illustrator, provider, and care team.   Interventions: Evaluation of current treatment plan related to  self management and patient's adherence to plan as established by  provider 04/01/23:  BSW appt rescheduled.   Diabetes Interventions:  (Status:  New goal.) Long Term Goal Assessed patient's understanding of A1c goal: <7% Reviewed medications with patient and discussed importance of medication adherence Counseled on importance of regular laboratory monitoring as prescribed Discussed plans with patient for ongoing care management follow up and provided patient with direct contact information for care management team Reviewed scheduled/upcoming provider appointments Advised patient, providing education and rationale, to check cbg as directed  and record, calling provider  for findings outside established parameters Referral made to pharmacy team for assistance with medications  Referral made to social work team for assistance with resources  Review of patient status, including review of consultants reports, relevant laboratory and other test results, and medications completed Assessed social determinant of health barriers Lab Results  Component Value Date   HGBA1C 8.3 (A) 01/15/2023   Hypertension Interventions:  (Status:  New goal.) Long Term Goal Last practice recorded BP readings:  BP Readings from Last 3 Encounters:  01/19/23 (!) 147/69  01/15/23 (!) 151/65  02/12/23                132/63   Most recent eGFR/CrCl:  Lab Results  Component Value Date   EGFR 47 (L) 10/16/2022    No components found for: "CRCL"  Evaluation of current treatment plan related  to hypertension self management and patient's adherence to plan as established by provider Reviewed medications with patient and discussed importance of compliance Discussed plans with patient for ongoing care management follow up and provided patient with direct contact information for care management team Advised patient, providing education and rationale, to monitor blood pressure daily and record, calling PCP for findings outside established parameters Reviewed scheduled/upcoming provider  appointments including:  Assessed social determinant of health barriers  SDOH Barriers (Status:  New goal.) Long Term Goal Patient interviewed and SDOH assessment performed        SDOH Interventions    Flowsheet Row Patient Outreach Telephone from 02/12/2023 in [place] POPULATION HEALTH DEPARTMENT Office Visit from 07/29/2022 in [facility]  SDOH Interventions    Food Insecurity Interventions -- Other (Comment)  Utilities Interventions Other (Comment)  [patient has set up a payment plan] --  Financial Strain Interventions -- Other (Comment)  Health Literacy Interventions Intervention Not Indicated --      Patient interviewed and appropriate assessments performed Discussed plans with patient for ongoing care management follow up and provided patient with direct contact information for care management team Advised patient to f/u with provider regarding cane. 04/01/23:  Patient has received resources from BSW-f/u scheduled.  Patient Goals/Self-Care Activities: Financial trader of TOC call rescheduling needs Take all medications as prescribed Attend all scheduled provider appointments Call pharmacy for medication refills 3-7 days in advance of running out of medications Perform all self care activities independently  Perform IADL's (shopping, preparing meals, housekeeping, managing finances) independently Call provider office for new concerns or questions  Work with the social worker to address care coordination needs and will continue to work with the clinical team to address health care and disease management related needs 04/01/23:  patient to f/u with legal aid if wishes.  Follow Up Plan:  The patient has been provided with contact information for the care management team and has been advised to call with any health related questions or concerns.  The care management team will reach out to the patient again over the next 30 business  days.

## 2023-04-01 NOTE — Patient Outreach (Signed)
Medicaid Managed Care   Nurse Care Manager Note  04/01/2023 Name:  Wanda Weaver MRN:  [id number] DOB:  December 14, 1960  Wanda Weaver is an 62 y.o. year old female who is a primary patient of [name], NP.  The [facility] Managed Care Coordination team was consulted for assistance with:    Chronic healthcare management needs, HTN, DM, SDOH, anxiety, sarcoidosis, osteoarthritis, obesity, constipation.  Wanda Weaver was given information about Medicaid Managed Care Coordination team services today. Wanda Weaver Patient agreed to services and verbal consent obtained.  Engaged with patient by telephone for follow up visit in response to provider referral for case management and/or care coordination services.   Patient is participating in a Managed Medicaid Plan:  Yes  Assessments/Interventions:  Review of past medical history, allergies, medications, health status, including review of consultants reports, laboratory and other test data, was performed as part of comprehensive evaluation and provision of chronic care management services.  SDOH (Social Drivers of Health) assessments and interventions performed: SDOH Interventions    Flowsheet Row Patient Outreach Telephone from 04/01/2023 in [place] POPULATION HEALTH DEPARTMENT Patient Outreach Telephone from 02/12/2023 in [place] POPULATION HEALTH DEPARTMENT Office Visit from 07/29/2022 in [facility]  SDOH Interventions     Food Insecurity Interventions -- -- Other (Comment)  Utilities Interventions -- Other (Comment)  [patient has set up a payment plan] --  Alcohol Usage Interventions Intervention Not Indicated (Score <7) -- --  Financial Strain Interventions -- -- Other (Comment)  Physical Activity Interventions Intervention Not Indicated, Other (Comments)  [physically not able to engage in this type of exercise] -- --  Health Literacy Interventions -- Intervention Not Indicated --      Care Plan Allergies  Allergen Reactions   Ibuprofen Palpitations    Medications Reviewed Today     Reviewed by Danie Chandler, RN (Registered Nurse) on 04/01/23 at 1147  Med List Status: <None>   Medication Order Taking? Sig Documenting Provider Last Dose Status Informant  Accu-Chek Softclix Lancets lancets [id number] No Use as instructed [name], NP Taking Active   albuterol (VENTOLIN HFA) 108 (90 Base) MCG/ACT inhaler [id number] No Inhale 2 puffs into the lungs every 6 (six) hours as needed. [name], NP Taking Active   blood glucose meter kit and supplies KIT [id number] No Use up to four times daily as directed. [name], NP Taking Active   cyclobenzaprine (FLEXERIL) 10 MG tablet [id number] No Take 1 tablet (10 mg total) by mouth 2 (two) times daily as needed for muscle spasms. Fayrene Helper, PA-C Taking Active   docusate sodium (COLACE) 100 MG capsule [id number] No Take 1 capsule (100 mg total) by mouth 2 (two) times daily. [name], NP More than a month Active   Elastic Bandages & Supports (KNEE BRACE ADJUSTABLE HINGED) MISC [id number] No 1 application by Does not apply route daily. Barbette Merino, NP Taking Active   glucose blood test strip [id number]  Use as instructed [name], NP  Active   hydrochlorothiazide (HYDRODIURIL) 25 MG tablet [id number]  Take 1 tablet (25 mg total) by mouth daily. [name], NP  Active   insulin glargine (LANTUS) 100 UNIT/ML injection [id number] No Inject 0.34 mLs (34 Units total) into the skin daily. [name], NP Taking Active   Insulin Syringe-Needle U-100 (INSULIN SYRINGE .5CC/31GX5/16") 31G X 5/16" 0.5 ML MISC [id number] No Use to inject insulin  daily  Patient not taking: Reported on 02/09/2023   [name], NP Not Taking Active   losartan (COZAAR) 25 MG tablet [id number]  Take 1 tablet (25 mg total) by mouth daily. [name], NP  Active   metFORMIN (GLUCOPHAGE) 500 MG tablet [id number]  Take 2  tablets (1,000 mg total) by mouth 2 (two) times daily with a meal. [name], NP  Active   metoprolol succinate (TOPROL-XL) 50 MG 24 hr tablet [id number] No Take 1 tablet (50 mg total) by mouth daily. Take with or immediately following a meal. [name], NP Taking Active   omeprazole (PRILOSEC) 20 MG capsule [id number] No Take 1 capsule (20 mg total) by mouth daily. [name], NP Taking Active   ondansetron (ZOFRAN) 4 MG tablet [id number] No Take 1 tablet (4 mg total) by mouth every 8 (eight) hours as needed for nausea or vomiting. Donell Beers, FNP Past Week Active            Med Note [name]   Wed Apr 22, 2022  8:57 AM) prn  rosuvastatin (CRESTOR) 40 MG tablet [id number]  Take 1 tablet (40 mg total) by mouth daily. [name], NP  Active            Patient Active Problem List   Diagnosis Date Noted   Paresthesia 10/22/2022   Gait abnormality 10/22/2022   Near syncope 09/14/2022   Adhesive capsulitis of left shoulder 07/24/2022   Constipation 05/25/2022   Osteoarthritis of right shoulder 04/22/2022   Bilateral lower extremity pain 08/20/2019   Bilateral lower extremity edema 08/20/2019   Hemoglobin A1C greater than 9%, indicating poor diabetic control 02/21/2019   Weight loss 02/21/2019   Muscle strain of wrist, left, initial encounter 09/22/2018   Hyperglycemia 08/15/2018   Essential hypertension 04/25/2018   Class 1 obesity due to excess calories with serious comorbidity in adult 04/25/2018   Right foot pain 04/25/2018   Anxiety 04/25/2018   Type 2 diabetes mellitus with complication, with long-term current use of insulin (HCC) 08/06/2017   Conditions to be addressed/monitored per PCP order:  Chronic healthcare management needs, HTN, DM, SDOH, anxiety, sarcoidosis, osteoarthritis, obesity, constipation.  Care Plan : General Plan of Care (Adult)  Updates made by Danie Chandler, RN since 04/01/2023 12:00 AM     Problem: Health Promotion  or Disease Self-Management (General Plan of Care)      Long-Range Goal: Chronic Disease Management and Care Coordination Needs   Start Date: 02/12/2023  Expected End Date: 05/15/2023  Priority: High  Note:   Current Barriers:  Knowledge Deficits related to plan of care for management of HTN, DM, osteoarthritis, obesity, anxiety, constipation, sarcoidosis Care Coordination needs related to HTN, DM, osteoarthritis, obesity, anxiety, constipation, sarcoidosis Chronic Disease Management support and education needs related to HTN, DM, osteoarthritis, obesity, anxiety, constipation, sardoiosis Financial Constraints  Difficulty obtaining medications 04/01/23:  patient states she received rental and utility resources-BSW f/u rescheduled as last appt missed.  Declines LCSW today for anxiety.  Blood sugar average 190.  Unsure about following up with [facility] Aid.  RNCM Clinical Goal(s):  verbalize understanding of plan for management of HTN, DM, osteoarthritis, obesity, anxiety, constipation, sarcoidosis as evidenced by patient report verbalize basic understanding of  HTN, DM, osteoarthritis, obesity, anxiety, constipation, sarcoidosis disease process and self health management plan as evidenced by patient report. take all medications exactly as prescribed and will call provider for medication related questions as evidenced by patient  report demonstrate understanding of rationale for each prescribed medication as evidenced by patient report attend all scheduled medical appointments as evidenced by patient report. demonstrate ongoing  adherence to prescribed treatment plan for HTN, DM, osteoarthritis, obesity, anxiety, constipation, sarcoidosis   as evidenced by patient report. continue to work with RN Care Manager to address care management and care coordination needs related to HTN, DM, osteoarthritis, obesity, anxiety, constipation, sarcoidosis as evidenced by adherence to CM Team Scheduled  appointments work with pharmacist to address medication needs related to  HTN, DM, osteoarthritis, obesity, anxiety, constipation, sarcoidosis  as evidenced by review or EMR and patient or pharmacist report work with Child psychotherapist to address resources needed related to the management of financial constraints  related to the management of HTN, DM, osteoarthritis, obesity, anxiety, constipation, sarcoidosis as evidenced by review of EMR and patient or Child psychotherapist report through collaboration with Medical illustrator, provider, and care team.   Interventions: Evaluation of current treatment plan related to  self management and patient's adherence to plan as established by provider 04/01/23:  BSW appt rescheduled.   Diabetes Interventions:  (Status:  New goal.) Long Term Goal Assessed patient's understanding of A1c goal: <7% Reviewed medications with patient and discussed importance of medication adherence Counseled on importance of regular laboratory monitoring as prescribed Discussed plans with patient for ongoing care management follow up and provided patient with direct contact information for care management team Reviewed scheduled/upcoming provider appointments Advised patient, providing education and rationale, to check cbg as directed  and record, calling provider  for findings outside established parameters Referral made to pharmacy team for assistance with medications  Referral made to social work team for assistance with resources  Review of patient status, including review of consultants reports, relevant laboratory and other test results, and medications completed Assessed social determinant of health barriers Lab Results  Component Value Date   HGBA1C 8.3 (A) 01/15/2023   Hypertension Interventions:  (Status:  New goal.) Long Term Goal Last practice recorded BP readings:  BP Readings from Last 3 Encounters:  01/19/23 (!) 147/69  01/15/23 (!) 151/65  02/12/23                132/63    Most recent eGFR/CrCl:  Lab Results  Component Value Date   EGFR 47 (L) 10/16/2022    No components found for: "CRCL"  Evaluation of current treatment plan related to hypertension self management and patient's adherence to plan as established by provider Reviewed medications with patient and discussed importance of compliance Discussed plans with patient for ongoing care management follow up and provided patient with direct contact information for care management team Advised patient, providing education and rationale, to monitor blood pressure daily and record, calling PCP for findings outside established parameters Reviewed scheduled/upcoming provider appointments including:  Assessed social determinant of health barriers  SDOH Barriers (Status:  New goal.) Long Term Goal Patient interviewed and SDOH assessment performed        SDOH Interventions    Flowsheet Row Patient Outreach Telephone from 02/12/2023 in [place] POPULATION HEALTH DEPARTMENT Office Visit from 07/29/2022 in [facility]  SDOH Interventions    Food Insecurity Interventions -- Other (Comment)  Utilities Interventions Other (Comment)  [patient has set up a payment plan] --  Financial Strain Interventions -- Other (Comment)  Health Literacy Interventions Intervention Not Indicated --      Patient interviewed and appropriate assessments performed Discussed plans with patient for ongoing care management follow  up and provided patient with direct contact information for care management team Advised patient to f/u with provider regarding cane. 04/01/23:  Patient has received resources from BSW-f/u scheduled.  Patient Goals/Self-Care Activities: Financial trader of TOC call rescheduling needs Take all medications as prescribed Attend all scheduled provider appointments Call pharmacy for medication refills 3-7 days in advance of running out of medications Perform all self care activities  independently  Perform IADL's (shopping, preparing meals, housekeeping, managing finances) independently Call provider office for new concerns or questions  Work with the social worker to address care coordination needs and will continue to work with the clinical team to address health care and disease management related needs 04/01/23:  patient to f/u with legal aid if wishes.  Follow Up Plan:  The patient has been provided with contact information for the care management team and has been advised to call with any health related questions or concerns.  The care management team will reach out to the patient again over the next 30 business  days.    Follow Up:  Patient agrees to Care Plan and Follow-up.  Plan: The Managed Medicaid care management team will reach out to the patient again over the next 30 business  days. and The  Patient has been provided with contact information for the Managed Medicaid care management team and has been advised to call with any health related questions or concerns.  Date/time of next scheduled RN care management/care coordination outreach:  04/30/23 at 1030.

## 2023-04-06 ENCOUNTER — Ambulatory Visit: Payer: Medicaid Other

## 2023-04-08 ENCOUNTER — Other Ambulatory Visit: Payer: Self-pay

## 2023-04-11 ENCOUNTER — Other Ambulatory Visit: Payer: Self-pay

## 2023-04-12 ENCOUNTER — Other Ambulatory Visit: Payer: Self-pay

## 2023-04-13 ENCOUNTER — Other Ambulatory Visit: Payer: Self-pay

## 2023-04-13 NOTE — Patient Instructions (Signed)
  Medicaid Managed Care   Unsuccessful Outreach Note  04/13/2023 Name: Wanda Weaver MRN: [id number] DOB: 04-23-1960  Referred by: Oley Bascom RAMAN, NP Reason for referral : High Risk Managed Medicaid (MM social work unsuccessful telephone outreach )   An unsuccessful telephone outreach was attempted today. The patient was referred to the case management team for assistance with care management and care coordination.   Follow Up Plan: A HIPAA compliant phone message was left for the patient providing contact information and requesting a return call.   Thersia Delene ROBINS, MHA [facility]  Managed [facility] Social Worker [phone]

## 2023-04-13 NOTE — Patient Outreach (Signed)
  Medicaid Managed Care   Unsuccessful Outreach Note  04/13/2023 Name: Arthelia Callicott MRN: [id number] DOB: [birth year]  Referred by: Oley Bascom RAMAN, NP Reason for referral : High Risk Managed Medicaid (MM social work unsuccessful telephone outreach )   A second unsuccessful telephone outreach was attempted today. The patient was referred to the case management team for assistance with care management and care coordination.   Follow Up Plan: A HIPAA compliant phone message was left for the patient providing contact information and requesting a return call.   Thersia Delene ROBINS, MHA [facility]  Managed [facility] Social Worker [phone]

## 2023-04-14 ENCOUNTER — Other Ambulatory Visit: Payer: Self-pay | Admitting: Nurse Practitioner

## 2023-04-15 ENCOUNTER — Institutional Professional Consult (permissible substitution) (HOSPITAL_BASED_OUTPATIENT_CLINIC_OR_DEPARTMENT_OTHER): Payer: Medicaid Other | Admitting: Pulmonary Disease

## 2023-04-15 ENCOUNTER — Other Ambulatory Visit: Payer: Self-pay

## 2023-04-15 MED ORDER — ALBUTEROL SULFATE HFA 108 (90 BASE) MCG/ACT IN AERS
2.0000 | INHALATION_SPRAY | Freq: Four times a day (QID) | RESPIRATORY_TRACT | 2 refills | Status: AC | PRN
  Filled 2023-04-15: qty 18, 25d supply, fill #0
  Filled 2023-08-23: qty 18, 25d supply, fill #1

## 2023-04-16 ENCOUNTER — Encounter (HOSPITAL_BASED_OUTPATIENT_CLINIC_OR_DEPARTMENT_OTHER): Payer: Self-pay | Admitting: Pulmonary Disease

## 2023-04-16 ENCOUNTER — Ambulatory Visit (HOSPITAL_BASED_OUTPATIENT_CLINIC_OR_DEPARTMENT_OTHER): Payer: Medicaid Other | Admitting: Pulmonary Disease

## 2023-04-16 VITALS — BP 114/72 | HR 80 | Resp 16 | Ht 62.0 in | Wt 207.0 lb

## 2023-04-16 DIAGNOSIS — D869 Sarcoidosis, unspecified: Secondary | ICD-10-CM | POA: Diagnosis not present

## 2023-04-16 DIAGNOSIS — G629 Polyneuropathy, unspecified: Secondary | ICD-10-CM

## 2023-04-16 NOTE — Patient Instructions (Addendum)
 Pulmonary sarcoidosis with diffuse lymphadenopathy --Dx in 01/2023  --ORDER PET myocardial sarcoid  History of immunosuppression High risk medication management --None --Hold on prednisone  due to uncontrolled DM2. Will await further testing and determine  Sarcoid Monitoring --Annual PFTs.  ORDER --Annual ophthalmology exam.  04/2023  DM2 with peripheral neuropathy --Encouraged to discuss with PCP for aggressive glucose control. Last A1c --Will refer to [facility] to evaluate for possible small nerve neuropathy related to sarcoid

## 2023-04-16 NOTE — Progress Notes (Signed)
 Subjective:   PATIENT ID: Wanda Weaver GENDER: female DOB: Oct 11, 1960, MRN: [id number]  Chief Complaint  Patient presents with   Consult    Sarcoidosis    Reason for Visit: New consult for sarcoidosis  Wanda Weaver is a 63 year old female never smoker with sarcoidosis, HTN, DM2, HLD who presents for evaluation for sarcoidosis.  She had abnormal CT CAP with numerous nodules including chest lymphadenopathy. She underwent biopsy left supraclavicular on 01/19/23 with results lymph node with abundant granulomatous inflammation. No evidence of lymphoma identified.  She reports shortness of breath with activity for over a years that has gradually worsened. Sometimes cough and wheezing. She uses albuterol  up to 3 times a week usually with exertion. Denies chest pain. She has limited activity due to nerve pain that initially began two years and a year ago her left leg started having nerve issues. Unchanged weight in the 200 lbs range. DM2 has not been controlled with last A1c 8.3. She has cloudy vision and planning to see her eye doctor.  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  Past Medical History:  Diagnosis Date   Bilateral lower extremity edema 07/2019   Bilateral lower extremity pain 07/2019   Diabetes mellitus without complication (HCC)    Hemoglobin A1C greater than 9%, indicating poor diabetic control    Hyperlipidemia    Hypertension    Vitamin D  deficiency      Family History  Problem Relation Age of Onset   Diabetes Mother    Dementia Mother    Diabetes Father    Hypertension Father      Social History   Occupational History   Not on file  Tobacco Use   Smoking status: Never   Smokeless tobacco: Never  Vaping Use   Vaping status: Never Used  Substance and Sexual  Activity   Alcohol use: No   Drug use: No   Sexual activity: Yes    Partners: Male    Allergies  Allergen Reactions   Ibuprofen  Palpitations     Outpatient Medications Prior to Visit  Medication Sig Dispense Refill   Accu-Chek Softclix Lancets lancets Use as instructed 100 each 12   albuterol  (VENTOLIN  HFA) 108 (90 Base) MCG/ACT inhaler Inhale 2 puffs into the lungs every 6 (six) hours as needed. 18 g 2   blood glucose meter kit and supplies KIT Use up to four times daily as directed. 1 each 0   cyclobenzaprine  (FLEXERIL ) 10 MG tablet Take 1 tablet (10 mg total) by mouth 2 (two) times daily as needed for muscle spasms. 20 tablet 0   docusate sodium  (COLACE) 100 MG capsule Take 1 capsule (100 mg total) by mouth 2 (two) times daily. 10 capsule 0   Elastic Bandages & Supports (KNEE BRACE ADJUSTABLE HINGED) MISC 1 application by Does not apply route daily. 1 each 0   glucose blood test strip Use as instructed 100 each 12   hydrochlorothiazide  (HYDRODIURIL ) 25 MG tablet Take 1 tablet (25 mg total) by mouth daily. 90 tablet 1   insulin  glargine (LANTUS ) 100 UNIT/ML injection Inject 0.34 mLs (34 Units total) into the skin daily. 10 mL 11   losartan  (COZAAR ) 25 MG tablet Take 1 tablet (25 mg total) by mouth daily. 90 tablet 1   metFORMIN  (GLUCOPHAGE ) 500 MG tablet Take 2 tablets (1,000 mg total)  by mouth 2 (two) times daily with a meal. 360 tablet 3   metoprolol  succinate (TOPROL -XL) 50 MG 24 hr tablet Take 1 tablet (50 mg total) by mouth daily. Take with or immediately following a meal. 90 tablet 3   omeprazole  (PRILOSEC) 20 MG capsule Take 1 capsule (20 mg total) by mouth daily. 30 capsule 3   ondansetron  (ZOFRAN ) 4 MG tablet Take 1 tablet (4 mg total) by mouth every 8 (eight) hours as needed for nausea or vomiting. 20 tablet 0   rosuvastatin  (CRESTOR ) 40 MG tablet Take 1 tablet (40 mg total) by mouth daily. 90 tablet 1   Insulin  Syringe-Needle U-100 (INSULIN  SYRINGE .5CC/31GX5/16) 31G X  5/16 0.5 ML MISC Use to inject insulin  daily (Patient not taking: Reported on 02/09/2023) 100 each 2   No facility-administered medications prior to visit.    Review of Systems  Constitutional:  Negative for chills, diaphoresis, fever, malaise/fatigue and weight loss.  HENT:  Negative for congestion.   Respiratory:  Positive for cough, shortness of breath and wheezing. Negative for hemoptysis and sputum production.   Cardiovascular:  Negative for chest pain, palpitations and leg swelling.  Neurological:  Positive for sensory change.     Objective:   Vitals:   04/16/23 0932  BP: 114/72  Pulse: 80  Resp: 16  SpO2: 99%  Weight: 207 lb (93.9 kg)  Height: 5' 2 (1.575 m)   SpO2: 99 %  Physical Exam: General: Well-appearing, no acute distress HENT: [place], AT Eyes: EOMI, no scleral icterus Respiratory: Clear to auscultation bilaterally.  No crackles, wheezing or rales Cardiovascular: RRR, -M/R/G, no JVD Extremities:-Edema,-tenderness Neuro: AAO x4, CNII-XII grossly intact Psych: Normal mood, normal affect  Data Reviewed:  Imaging: CT Chest abdomen pelvis 12/07/22 - Numerous enlarged lower left cervical and supraclavicular nodules measuring up to 1.7 x 1.4 cm. Numerous enlarged mediastinal and bilateral hilar lymph nodes, largest right paratracheal node 2.4 x 1.9 cm. Numerous small bilateral pulmonary nodules including 0.5 cm LUL and0.4 cm nodule RUL. Includes gastrohepatic ligament, porta hepatis, portacaval nodules in upper abdomen  PFT: None on file  Labs:    Latest Ref Rng & Units 01/19/2023    6:58 AM 12/04/2022   12:24 PM 10/16/2022    3:44 PM  CBC  WBC 4.0 - 10.5 K/uL 4.9  4.7  5.1   Hemoglobin 12.0 - 15.0 g/dL 88.7  88.6  88.9   Hematocrit 36.0 - 46.0 % 34.4  32.5  33.6   Platelets 150 - 400 K/uL 201  197  221       Latest Ref Rng & Units 01/19/2023    6:58 AM 12/04/2022   12:24 PM 10/16/2022    3:44 PM  CMP  Glucose 70 - 99 mg/dL 795  877  853   BUN 8 - 23 mg/dL  22  24  21    Creatinine 0.44 - 1.00 mg/dL 8.70  8.83  8.70   Sodium 135 - 145 mmol/L 136  139  135   Potassium 3.5 - 5.1 mmol/L 4.1  4.4  4.2   Chloride 98 - 111 mmol/L 98  104  98   CO2 22 - 32 mmol/L 25  28  24    Calcium  8.9 - 10.3 mg/dL 9.9  9.9  9.5   Total Protein 6.5 - 8.1 g/dL  7.1  6.5   Total Bilirubin 0.3 - 1.2 mg/dL  0.6  0.5   Alkaline Phos 38 - 126 U/L  70  81  AST 15 - 41 U/L  18  17   ALT 0 - 44 U/L  17  14       Assessment & Plan:   Discussion: 63 year old female never smoker with sarcoidosis, HTN, DM2, HLD who presents for evaluation for sarcoidosis. Reviewed chart for history, imaging and tests  We discussed the clinical course of sarcoid and management including serial PFTs, labs, eye exam, and EKG and chest imaging if indicated. If symptoms suggest sarcoid flare in the future, we would manage with steroids +/- biologics.  Pulmonary sarcoidosis with diffuse lymphadenopathy --Dx in 01/2023  --ORDER PET myocardial sarcoid  History of immunosuppression High risk medication management --No prior history --Hold on prednisone  due to uncontrolled DM2. Will await further testing and determine  Sarcoid Monitoring --Recent chest imaging reviewed.  --Annual PFTs.  ORDER --Annual ophthalmology exam.  04/2023 --Routine labs as needed: CBC with diff, CMET, 1, 25 and 25 hydroxy vitamin D , urinary calcium   DM2 with peripheral neuropathy --Encouraged to discuss with PCP for aggressive glucose control. Last A1c --Will refer to [facility] to evaluate for possible small nerve neuropathy related to sarcoid   Health Maintenance Immunization History  Administered Date(s) Administered   Pneumococcal Polysaccharide-23 08/06/2017   CT Lung Screen - not qualified. Never smoker  Orders Placed This Encounter  Procedures   NM PET CT MYOCARDIAL SARCOIDOSIS    Standing Status:   Future    Expiration Date:   04/15/2024    If indicated for the ordered procedure, I authorize  the administration of a radiopharmaceutical per Radiology protocol:   Yes    Preferred Imaging Location:   [facility]   Ambulatory referral to Neurology    Referral Priority:   Routine    Referral Type:   Consultation    Referral Reason:   Specialty Services Required    Requested Specialty:   Neurology    Number of Visits Requested:   1   Pulmonary function test    Standing Status:   Future    Expiration Date:   04/15/2024    Where should this test be performed?:   [facility]    Full PFT: includes the following: basic spirometry, spirometry pre & post bronchodilator, diffusion capacity (DLCO), lung volumes:   Full PFT  No orders of the defined types were placed in this encounter.   Return in about 6 weeks (around 05/28/2023).  I have spent a total time of 60-minutes on the day of the appointment reviewing prior documentation, coordinating care and discussing medical diagnosis and plan with the patient/family. Imaging, labs and tests included in this note have been reviewed and interpreted independently by me.  Lismary Kiehn Slater Staff, MD [facility] 04/16/2023 9:51 AM

## 2023-04-19 ENCOUNTER — Ambulatory Visit: Payer: Self-pay | Admitting: Nurse Practitioner

## 2023-04-20 ENCOUNTER — Other Ambulatory Visit: Payer: Self-pay

## 2023-04-20 ENCOUNTER — Ambulatory Visit: Payer: Self-pay | Admitting: Nurse Practitioner

## 2023-04-29 ENCOUNTER — Other Ambulatory Visit: Payer: Self-pay

## 2023-04-29 ENCOUNTER — Ambulatory Visit (HOSPITAL_COMMUNITY): Payer: Medicaid Other

## 2023-04-30 ENCOUNTER — Other Ambulatory Visit: Payer: Self-pay

## 2023-04-30 ENCOUNTER — Other Ambulatory Visit: Payer: Self-pay | Admitting: Obstetrics and Gynecology

## 2023-04-30 NOTE — Patient Instructions (Signed)
Visit Information  [name] was given information about Medicaid Managed Care team care coordination services as a part of their [facility] Community Plan Medicaid benefit. [name] verbally consented to engagement with the [facility] Managed Care team.   If you are experiencing a medical emergency, please call 911 or report to your local emergency department or urgent care.   If you have a non-emergency medical problem during routine business hours, please contact your provider's office and ask to speak with a nurse.   For questions related to your [facility], please call: [phone] or visit the homepage here: [email or web address]  If you would like to schedule transportation through your [facility], please call the following number at least 2 days in advance of your appointment: [phone]   Rides for urgent appointments can also be made after hours by calling Member Services.  Call the Behavioral Health Crisis Line at [phone], at any time, 24 hours a day, 7 days a week. If you are in danger or need immediate medical attention call 911.  If you would like help to quit smoking, call 1-800-QUIT-NOW (670-748-3140) OR Espaol: 1-855-Djelo-Ya (4-259-563-8756) o para ms informacin haga clic aqu or Text READY to 433-295 to register via text  [name] - following are the goals we discussed in your visit today:   Goals Addressed             This Visit's Progress    Establish Plan of Care for Chronic Disease Management Needs       Timeframe:  Long-Range Goal Priority:  High Start Date:     02/12/23                        Expected End Date:     ongoing                  Follow Up Date 05/31/23   - schedule appointment for vaccines needed due to my age or health - schedule recommended health tests (blood work, mammogram, colonoscopy, pap test) - schedule and keep  appointment for annual check-up    Why is this important?   Screening tests can find diseases early when they are easier to treat.  Your doctor or nurse will talk with you about which tests are important for you.  Getting shots for common diseases like the flu and shingles will help prevent them.   04/30/23:  Patient seen and evaluated by PULM 1/3.  Has PET 2/20.  PCP 1/22.   Patient verbalizes understanding of instructions and care plan provided today and agrees to view in MyChart. Active MyChart status and patient understanding of how to access instructions and care plan via MyChart confirmed with patient.     The Managed Medicaid care management team will reach out to the patient again over the next 30 business  days.  The  Patient has been provided with contact information for the Managed Medicaid care management team and has been advised to call with any health related questions or concerns.   Kathi Der RN, BSN, [employer] [facility] Health RN Care Manager Direct Dial [phone]/KZS [phone] Website: [email or web address]   Following is a copy of your plan of care:  Care Plan : General Plan of Care (Adult)  Updates made by Danie Chandler, RN since 04/30/2023 12:00 AM     Problem: Health Promotion or Disease Self-Management (General Plan of Care)  Long-Range Goal: Chronic Disease Management and Care Coordination Needs   Start Date: 02/12/2023  Expected End Date: 07/29/2023  Priority: High  Note:   Current Barriers:  Knowledge Deficits related to plan of care for management of HTN, DM, osteoarthritis, obesity, anxiety, constipation, sarcoidosis Care Coordination needs related to HTN, DM, osteoarthritis, obesity, anxiety, constipation, sarcoidosis Chronic Disease Management support and education needs related to HTN, DM, osteoarthritis, obesity, anxiety, constipation, sardoiosis Financial Constraints  Difficulty obtaining medications 04/30/23:  Patient seen  and evaluated by PULM for sarcoidosis, f/u appts scheduled.  BP and BG stable. BSW appt rescheduled-needs eye resources-needs eye exam.  To have knee xray 1/27.   RNCM Clinical Goal(s):  verbalize understanding of plan for management of HTN, DM, osteoarthritis, obesity, anxiety, constipation, sarcoidosis as evidenced by patient report verbalize basic understanding of  HTN, DM, osteoarthritis, obesity, anxiety, constipation, sarcoidosis disease process and self health management plan as evidenced by patient report. take all medications exactly as prescribed and will call provider for medication related questions as evidenced by patient report demonstrate understanding of rationale for each prescribed medication as evidenced by patient report attend all scheduled medical appointments as evidenced by patient report. demonstrate ongoing  adherence to prescribed treatment plan for HTN, DM, osteoarthritis, obesity, anxiety, constipation, sarcoidosis   as evidenced by patient report. continue to work with RN Care Manager to address care management and care coordination needs related to HTN, DM, osteoarthritis, obesity, anxiety, constipation, sarcoidosis as evidenced by adherence to CM Team Scheduled appointments work with pharmacist to address medication needs related to  HTN, DM, osteoarthritis, obesity, anxiety, constipation, sarcoidosis  as evidenced by review or EMR and patient or pharmacist report work with Child psychotherapist to address resources needed related to the management of financial constraints  related to the management of HTN, DM, osteoarthritis, obesity, anxiety, constipation, sarcoidosis as evidenced by review of EMR and patient or Child psychotherapist report through collaboration with Medical illustrator, provider, and care team.   Interventions: Evaluation of current treatment plan related to  self management and patient's adherence to plan as established by provider 04/30/23: :  BSW appt  rescheduled.   Diabetes Interventions:  (Status:  New goal.) Long Term Goal Assessed patient's understanding of A1c goal: <7% Reviewed medications with patient and discussed importance of medication adherence Counseled on importance of regular laboratory monitoring as prescribed Discussed plans with patient for ongoing care management follow up and provided patient with direct contact information for care management team Reviewed scheduled/upcoming provider appointments Advised patient, providing education and rationale, to check cbg as directed  and record, calling provider  for findings outside established parameters Referral made to pharmacy team for assistance with medications  Referral made to social work team for assistance with resources  Review of patient status, including review of consultants reports, relevant laboratory and other test results, and medications completed Assessed social determinant of health barriers Lab Results  Component Value Date   HGBA1C 8.3 (A) 01/15/2023   Hypertension Interventions:  (Status:  New goal.) Long Term Goal Last practice recorded BP readings:  BP Readings from Last 3 Encounters:  01/19/23 (!) 147/69  04/16/23           114/72  02/12/23                132/63   Most recent eGFR/CrCl:  Lab Results  Component Value Date   EGFR 47 (L) 10/16/2022    No components found for: "CRCL"  Evaluation of current treatment plan  related to hypertension self management and patient's adherence to plan as established by provider Reviewed medications with patient and discussed importance of compliance Discussed plans with patient for ongoing care management follow up and provided patient with direct contact information for care management team Advised patient, providing education and rationale, to monitor blood pressure daily and record, calling PCP for findings outside established parameters Reviewed scheduled/upcoming provider appointments including:   Assessed social determinant of health barriers  SDOH Barriers (Status:  New goal.) Long Term Goal Patient interviewed and SDOH assessment performed        SDOH Interventions    Flowsheet Row Patient Outreach Telephone from 02/12/2023 in [place] POPULATION HEALTH DEPARTMENT Office Visit from 07/29/2022 in [facility]  SDOH Interventions    Food Insecurity Interventions -- Other (Comment)  Utilities Interventions Other (Comment)  [patient has set up a payment plan] --  Financial Strain Interventions -- Other (Comment)  Health Literacy Interventions Intervention Not Indicated --      Patient interviewed and appropriate assessments performed Discussed plans with patient for ongoing care management follow up and provided patient with direct contact information for care management team Advised patient to f/u with provider regarding cane. 04/01/23:  Patient has received resources from BSW-f/u scheduled. 04/30/23:  Patient needs eye resources-BSW appt rescheduled.   Patient Goals/Self-Care Activities: Financial trader of TOC call rescheduling needs Take all medications as prescribed Attend all scheduled provider appointments Call pharmacy for medication refills 3-7 days in advance of running out of medications Perform all self care activities independently  Perform IADL's (shopping, preparing meals, housekeeping, managing finances) independently Call provider office for new concerns or questions  Work with the social worker to address care coordination needs and will continue to work with the clinical team to address health care and disease management related needs 04/01/23:  patient to f/u with legal aid if wishes.  Follow Up Plan:  The patient has been provided with contact information for the care management team and has been advised to call with any health related questions or concerns.  The care management team will reach out to the patient again over the  next 30 business  days.

## 2023-04-30 NOTE — Patient Outreach (Signed)
Medicaid Managed Care   Nurse Care Manager Note  04/30/2023 Name:  Wanda Weaver MRN:  [id number] DOB:  05/12/1960  Wanda Weaver is an 63 y.o. year old female who is a primary patient of [name], NP.  The [facility] Managed Care Coordination team was consulted for assistance with:    Chronic healthcare management needs, HTN, DM, sarcoidosis, osteoarthritis, obesity, peripheral neuropathy, h/o constipation  [name] was given information about Medicaid Managed Care Coordination team services today. Wanda Weaver Patient agreed to services and verbal consent obtained.  Engaged with patient by telephone for follow up visit in response to provider referral for case management and/or care coordination services.   Patient is participating in a Managed Medicaid Plan:  Yes  Assessments/Interventions:  Review of past medical history, allergies, medications, health status, including review of consultants reports, laboratory and other test data, was performed as part of comprehensive evaluation and provision of chronic care management services.  SDOH (Social Drivers of Health) assessments and interventions performed: SDOH Interventions    Flowsheet Row Patient Outreach Telephone from 04/30/2023 in [place] POPULATION HEALTH DEPARTMENT Patient Outreach Telephone from 04/01/2023 in [place] POPULATION HEALTH DEPARTMENT Patient Outreach Telephone from 02/12/2023 in [place] POPULATION HEALTH DEPARTMENT Office Visit from 07/29/2022 in [facility]  SDOH Interventions      Food Insecurity Interventions -- -- -- Other (Comment)  Utilities Interventions -- -- Other (Comment)  [patient has set up a payment plan] --  Alcohol Usage Interventions -- Intervention Not Indicated (Score <7) -- --  Financial Strain Interventions -- -- -- Other (Comment)  Physical Activity Interventions -- Intervention Not Indicated, Other (Comments)  [physically not able to  engage in this type of exercise] -- --  Stress Interventions Intervention Not Indicated -- -- --  Health Literacy Interventions -- -- Intervention Not Indicated --     Care Plan Allergies  Allergen Reactions   Ibuprofen Palpitations    Medications Reviewed Today     Reviewed by Danie Chandler, RN (Registered Nurse) on 04/30/23 at 1037  Med List Status: <None>   Medication Order Taking? Sig Documenting Provider Last Dose Status Informant  Accu-Chek Softclix Lancets lancets [id number] No Use as instructed [name], NP Taking Active   albuterol (VENTOLIN HFA) 108 (90 Base) MCG/ACT inhaler [id number] No Inhale 2 puffs into the lungs every 6 (six) hours as needed. [name], NP Taking Active   blood glucose meter kit and supplies KIT [id number] No Use up to four times daily as directed. [name], NP Taking Active   cyclobenzaprine (FLEXERIL) 10 MG tablet [id number] No Take 1 tablet (10 mg total) by mouth 2 (two) times daily as needed for muscle spasms. Wanda Helper, PA-C Taking Active   docusate sodium (COLACE) 100 MG capsule [id number] No Take 1 capsule (100 mg total) by mouth 2 (two) times daily. [name], NP Taking Active   Elastic Bandages & Supports (KNEE BRACE ADJUSTABLE HINGED) MISC [id number] No 1 application by Does not apply route daily. Wanda Merino, NP Taking Active   glucose blood test strip [id number] No Use as instructed [name], NP Taking Active   hydrochlorothiazide (HYDRODIURIL) 25 MG tablet [id number] No Take 1 tablet (25 mg total) by mouth daily. [name], NP Taking Active   insulin glargine (LANTUS) 100 UNIT/ML injection [id number] No Inject 0.34 mLs (34 Units total) into the skin daily. Wanda Weaver,  Wanda Pounds, NP Taking Active   Insulin Syringe-Needle U-100 (INSULIN SYRINGE .5CC/31GX5/16") 31G X 5/16" 0.5 ML MISC [id number] No Use to inject insulin daily  Patient not taking: Reported on 02/09/2023   [name], NP Not Taking Active    losartan (COZAAR) 25 MG tablet [id number] No Take 1 tablet (25 mg total) by mouth daily. [name], NP Taking Active   metFORMIN (GLUCOPHAGE) 500 MG tablet [id number] No Take 2 tablets (1,000 mg total) by mouth 2 (two) times daily with a meal. [name], NP Taking Active   metoprolol succinate (TOPROL-XL) 50 MG 24 hr tablet [id number] No Take 1 tablet (50 mg total) by mouth daily. Take with or immediately following a meal. [name], NP Taking Active   omeprazole (PRILOSEC) 20 MG capsule [id number] No Take 1 capsule (20 mg total) by mouth daily. [name], NP Taking Active   ondansetron (ZOFRAN) 4 MG tablet [id number] No Take 1 tablet (4 mg total) by mouth every 8 (eight) hours as needed for nausea or vomiting. Donell Beers, FNP Taking Active            Med Note Wanda Weaver   Wed Apr 22, 2022  8:57 AM) prn  rosuvastatin (CRESTOR) 40 MG tablet [id number] No Take 1 tablet (40 mg total) by mouth daily. [name], NP Taking Active            Patient Active Problem List   Diagnosis Date Noted   Paresthesia 10/22/2022   Gait abnormality 10/22/2022   Near syncope 09/14/2022   Adhesive capsulitis of left shoulder 07/24/2022   Constipation 05/25/2022   Osteoarthritis of right shoulder 04/22/2022   Bilateral lower extremity pain 08/20/2019   Bilateral lower extremity edema 08/20/2019   Hemoglobin A1C greater than 9%, indicating poor diabetic control 02/21/2019   Weight loss 02/21/2019   Muscle strain of wrist, left, initial encounter 09/22/2018   Hyperglycemia 08/15/2018   Essential hypertension 04/25/2018   Class 1 obesity due to excess calories with serious comorbidity in adult 04/25/2018   Right foot pain 04/25/2018   Anxiety 04/25/2018   Type 2 diabetes mellitus with complication, with long-term current use of insulin (HCC) 08/06/2017   Conditions to be addressed/monitored per PCP order:  Chronic healthcare management needs, HTN, DM, sarcoidosis,  osteoarthritis, obesity, peripheral neuropathy, h/o constipation  Care Plan : General Plan of Care (Adult)  Updates made by Danie Chandler, RN since 04/30/2023 12:00 AM     Problem: Health Promotion or Disease Self-Management (General Plan of Care)      Long-Range Goal: Chronic Disease Management and Care Coordination Needs   Start Date: 02/12/2023  Expected End Date: 07/29/2023  Priority: High  Note:   Current Barriers:  Knowledge Deficits related to plan of care for management of HTN, DM, osteoarthritis, obesity, anxiety, constipation, sarcoidosis Care Coordination needs related to HTN, DM, osteoarthritis, obesity, anxiety, constipation, sarcoidosis Chronic Disease Management support and education needs related to HTN, DM, osteoarthritis, obesity, anxiety, constipation, sardoiosis Financial Constraints  Difficulty obtaining medications 04/30/23:  Patient seen and evaluated by PULM for sarcoidosis, f/u appts scheduled.  BP and BG stable. BSW appt rescheduled-needs eye resources-needs eye exam.  To have knee xray 1/27.   RNCM Clinical Goal(s):  verbalize understanding of plan for management of HTN, DM, osteoarthritis, obesity, anxiety, constipation, sarcoidosis as evidenced by patient report verbalize basic understanding of  HTN, DM, osteoarthritis, obesity, anxiety, constipation, sarcoidosis disease process and self health management plan as  evidenced by patient report. take all medications exactly as prescribed and will call provider for medication related questions as evidenced by patient report demonstrate understanding of rationale for each prescribed medication as evidenced by patient report attend all scheduled medical appointments as evidenced by patient report. demonstrate ongoing  adherence to prescribed treatment plan for HTN, DM, osteoarthritis, obesity, anxiety, constipation, sarcoidosis   as evidenced by patient report. continue to work with RN Care Manager to address care  management and care coordination needs related to HTN, DM, osteoarthritis, obesity, anxiety, constipation, sarcoidosis as evidenced by adherence to CM Team Scheduled appointments work with pharmacist to address medication needs related to  HTN, DM, osteoarthritis, obesity, anxiety, constipation, sarcoidosis  as evidenced by review or EMR and patient or pharmacist report work with Child psychotherapist to address resources needed related to the management of financial constraints  related to the management of HTN, DM, osteoarthritis, obesity, anxiety, constipation, sarcoidosis as evidenced by review of EMR and patient or Child psychotherapist report through collaboration with Medical illustrator, provider, and care team.   Interventions: Evaluation of current treatment plan related to  self management and patient's adherence to plan as established by provider 04/30/23: :  BSW appt rescheduled.  Diabetes Interventions:  (Status:  New goal.) Long Term Goal Assessed patient's understanding of A1c goal: <7% Reviewed medications with patient and discussed importance of medication adherence Counseled on importance of regular laboratory monitoring as prescribed Discussed plans with patient for ongoing care management follow up and provided patient with direct contact information for care management team Reviewed scheduled/upcoming provider appointments Advised patient, providing education and rationale, to check cbg as directed  and record, calling provider  for findings outside established parameters Referral made to pharmacy team for assistance with medications  Referral made to social work team for assistance with resources  Review of patient status, including review of consultants reports, relevant laboratory and other test results, and medications completed Assessed social determinant of health barriers Lab Results  Component Value Date   HGBA1C 8.3 (A) 01/15/2023   Hypertension Interventions:  (Status:  New goal.)  Long Term Goal Last practice recorded BP readings:  BP Readings from Last 3 Encounters:  01/19/23 (!) 147/69  04/16/23           114/72  02/12/23                132/63   Most recent eGFR/CrCl:  Lab Results  Component Value Date   EGFR 47 (L) 10/16/2022    No components found for: "CRCL"  Evaluation of current treatment plan related to hypertension self management and patient's adherence to plan as established by provider Reviewed medications with patient and discussed importance of compliance Discussed plans with patient for ongoing care management follow up and provided patient with direct contact information for care management team Advised patient, providing education and rationale, to monitor blood pressure daily and record, calling PCP for findings outside established parameters Reviewed scheduled/upcoming provider appointments including:  Assessed social determinant of health barriers  SDOH Barriers (Status:  New goal.) Long Term Goal Patient interviewed and SDOH assessment performed        SDOH Interventions    Flowsheet Row Patient Outreach Telephone from 02/12/2023 in [place] POPULATION HEALTH DEPARTMENT Office Visit from 07/29/2022 in [facility]  SDOH Interventions    Food Insecurity Interventions -- Other (Comment)  Utilities Interventions Other (Comment)  [patient has set up a payment plan] --  Financial Strain Interventions --  Other (Comment)  Health Literacy Interventions Intervention Not Indicated --      Patient interviewed and appropriate assessments performed Discussed plans with patient for ongoing care management follow up and provided patient with direct contact information for care management team Advised patient to f/u with provider regarding cane. 04/01/23:  Patient has received resources from BSW-f/u scheduled. 04/30/23:  Patient needs eye resources-BSW appt rescheduled.   Patient Goals/Self-Care Activities: Engineer, building services of TOC call rescheduling needs Take all medications as prescribed Attend all scheduled provider appointments Call pharmacy for medication refills 3-7 days in advance of running out of medications Perform all self care activities independently  Perform IADL's (shopping, preparing meals, housekeeping, managing finances) independently Call provider office for new concerns or questions  Work with the social worker to address care coordination needs and will continue to work with the clinical team to address health care and disease management related needs 04/01/23:  patient to f/u with legal aid if wishes.  Follow Up Plan:  The patient has been provided with contact information for the care management team and has been advised to call with any health related questions or concerns.  The care management team will reach out to the patient again over the next 30 business  days.    Follow Up:  Patient agrees to Care Plan and Follow-up.  Plan: The Managed Medicaid care management team will reach out to the patient again over the next 30 business  days. and The  Patient has been provided with contact information for the Managed Medicaid care management team and has been advised to call with any health related questions or concerns.  Date/time of next scheduled RN care management/care coordination outreach:  05/31/23 at 230.

## 2023-05-03 ENCOUNTER — Other Ambulatory Visit: Payer: Self-pay

## 2023-05-03 NOTE — Patient Outreach (Signed)
Medicaid Managed Care Social Work Note  05/03/2023 Name:  Wanda Weaver MRN:  [id number] DOB:  08/03/1960  Wanda Weaver is an 63 y.o. year old female who is a primary patient of Wanda Andrew, NP.  The [facility] Managed Care Coordination team was consulted for assistance with:   eye resources  and utility resources  [name] was given information about Medicaid Managed Care Coordination team services today. [name] Patient agreed to services and verbal consent obtained.  Engaged with patient  for by telephone forfollow up visit in response to referral for case management and/or care coordination services.   Patient is participating in a Managed Medicaid Plan:  Yes  Assessments/Interventions:  Review of past medical history, allergies, medications, health status, including review of consultants reports, laboratory and other test data, was performed as part of comprehensive evaluation and provision of chronic care management services.  SDOH: (Social Drivers of Health) assessments and interventions performed: SDOH Interventions    Flowsheet Row Patient Outreach Telephone from 04/30/2023 in [place] POPULATION HEALTH DEPARTMENT Patient Outreach Telephone from 04/01/2023 in [place] POPULATION HEALTH DEPARTMENT Patient Outreach Telephone from 02/12/2023 in [place] POPULATION HEALTH DEPARTMENT Office Visit from 07/29/2022 in [facility]  SDOH Interventions      Food Insecurity Interventions -- -- -- Other (Comment)  Utilities Interventions -- -- Other (Comment)  [patient has set up a payment plan] --  Alcohol Usage Interventions -- Intervention Not Indicated (Score <7) -- --  Financial Strain Interventions -- -- -- Other (Comment)  Physical Activity Interventions -- Intervention Not Indicated, Other (Comments)  [physically not able to engage in this type of exercise] -- --  Stress Interventions Intervention Not Indicated -- -- --   Health Literacy Interventions -- -- Intervention Not Indicated --     BSW completed a telephone outreach with patient, she states she went to the eye doctor and they told her she could only get an exam every 2 years. BSW and patient discussed trying a different eye doctor. BSW will mail a list of Medicaid eye doctors to patient. She also states she is in need of utility resources, BSW will mail resources.  Advanced Directives Status:  Not addressed in this encounter.  Care Plan                 Allergies  Allergen Reactions   Ibuprofen Palpitations    Medications Reviewed Today   Medications were not reviewed in this encounter     Patient Active Problem List   Diagnosis Date Noted   Paresthesia 10/22/2022   Gait abnormality 10/22/2022   Near syncope 09/14/2022   Adhesive capsulitis of left shoulder 07/24/2022   Constipation 05/25/2022   Osteoarthritis of right shoulder 04/22/2022   Bilateral lower extremity pain 08/20/2019   Bilateral lower extremity edema 08/20/2019   Hemoglobin A1C greater than 9%, indicating poor diabetic control 02/21/2019   Weight loss 02/21/2019   Muscle strain of wrist, left, initial encounter 09/22/2018   Hyperglycemia 08/15/2018   Essential hypertension 04/25/2018   Class 1 obesity due to excess calories with serious comorbidity in adult 04/25/2018   Right foot pain 04/25/2018   Anxiety 04/25/2018   Type 2 diabetes mellitus with complication, with long-term current use of insulin (HCC) 08/06/2017    Conditions to be addressed/monitored per PCP order:   eye and utility resources  There are no care plans that you recently modified to display  for this patient.   Follow up:  Patient agrees to Care Plan and Follow-up.  Plan: The Managed Medicaid care management team will reach out to the patient again over the next 30 days.  Date/time of next scheduled Social Work care management/care coordination outreach:  06/03/23  Gus Puma, Kenard Gower,  [facility]  Managed [facility] Social Worker [phone]

## 2023-05-03 NOTE — Patient Instructions (Signed)
 Visit Information  Wanda Weaver was given information about Medicaid Managed Care team care coordination services as a part of their [facility] Community Plan Medicaid benefit. [name] verbally consented. to engagement with the [facility] Managed Care team.   If you are experiencing a medical emergency, please call 911 or report to your local emergency department or urgent care.   If you have a non-emergency medical problem during routine business hours, please contact your provider's office and ask to speak with a nurse.   For questions related to your [facility], please call: [phone] or visit the homepage here: [email or web address]  If you would like to schedule transportation through your [facility], please call the following number at least 2 days in advance of your appointment: [phone]   Rides for urgent appointments can also be made after hours by calling Member Services.  Call the Behavioral Health Crisis Line at [phone], at any time, 24 hours a day, 7 days a week. If you are in danger or need immediate medical attention call 911.  If you would like help to quit smoking, call 1-800-QUIT-NOW (8046468851) OR Espaol: 1-855-Djelo-Ya (3-664-403-4742) o para ms informacin haga clic aqu or Text READY to 595-638 to register via text  [name] - following are the goals we discussed in your visit today:   Goals Addressed   None      Social Worker will follow up in 30 days.   Gus Puma, Kenard Gower, MHA [facility]  Managed Medicaid Social Worker [phone]   Following is a copy of your plan of care:  There are no care plans that you recently modified to display for this patient.

## 2023-05-05 ENCOUNTER — Ambulatory Visit: Payer: Self-pay | Admitting: Nurse Practitioner

## 2023-05-06 ENCOUNTER — Other Ambulatory Visit: Payer: Self-pay

## 2023-05-10 ENCOUNTER — Other Ambulatory Visit: Payer: Self-pay

## 2023-05-20 ENCOUNTER — Ambulatory Visit: Payer: Self-pay | Admitting: Nurse Practitioner

## 2023-05-21 ENCOUNTER — Other Ambulatory Visit: Payer: Self-pay

## 2023-05-24 ENCOUNTER — Encounter: Payer: Self-pay | Admitting: Nurse Practitioner

## 2023-05-24 ENCOUNTER — Other Ambulatory Visit: Payer: Self-pay

## 2023-05-24 ENCOUNTER — Ambulatory Visit (INDEPENDENT_AMBULATORY_CARE_PROVIDER_SITE_OTHER): Payer: Medicaid Other | Admitting: Nurse Practitioner

## 2023-05-24 VITALS — BP 164/67 | HR 70 | Temp 97.9°F | Wt 211.4 lb

## 2023-05-24 DIAGNOSIS — Z794 Long term (current) use of insulin: Secondary | ICD-10-CM

## 2023-05-24 DIAGNOSIS — E118 Type 2 diabetes mellitus with unspecified complications: Secondary | ICD-10-CM | POA: Diagnosis not present

## 2023-05-24 LAB — POCT GLYCOSYLATED HEMOGLOBIN (HGB A1C): Hemoglobin A1C: 9.4 % — AB (ref 4.0–5.6)

## 2023-05-24 NOTE — Progress Notes (Signed)
 Subjective   Patient ID: Wanda Weaver, female    DOB: [birth year], 63 y.o.   MRN: [id number]  Chief Complaint  Patient presents with   Medical Management of Chronic Issues    Referring provider: Jerrlyn Morel, NP  Wanda Weaver is a 63 y.o. female with Past Medical History: 07/2019: Bilateral lower extremity edema 07/2019: Bilateral lower extremity pain No date: Diabetes mellitus without complication (HCC) No date: Hemoglobin A1C greater than 9%, indicating poor diabetic  control No date: Hyperlipidemia No date: Hypertension No date: Vitamin D  deficiency  HPI  Patient presents today for a follow-up visit.  She was recently diagnosed with numerous nodules including chest lymphadenopathy.  She has followed with pulmonary.  She was diagnosed with pulmonary sarcoidosis with diffuse lymphadenopathy.  Pulmonary decided to defer prednisone  at this time due to for diabetic control.  It was recommended by pulmonary that she have very strict diabetic control.  We will place a referral for endocrinology for further evaluation and treatment. A1C in office today is 9.4.  Blood pressure elevated in office today. Denies f/c/s, n/v/d, hemoptysis, PND, leg swelling. Denies chest pain or edema.     Allergies  Allergen Reactions   Ibuprofen  Palpitations    Immunization History  Administered Date(s) Administered   Pneumococcal Polysaccharide-23 08/06/2017    Tobacco History: Social History   Tobacco Use  Smoking Status Never  Smokeless Tobacco Never   Counseling given: Not Answered   Outpatient Encounter Medications as of 05/24/2023  Medication Sig   Accu-Chek Softclix Lancets lancets Use as instructed   albuterol  (VENTOLIN  HFA) 108 (90 Base) MCG/ACT inhaler Inhale 2 puffs into the lungs every 6 (six) hours as needed.   blood glucose meter kit and supplies KIT Use up to four times daily as directed.   Elastic Bandages & Supports (KNEE BRACE ADJUSTABLE HINGED) MISC 1 application by Does  not apply route daily.   glucose blood test strip Use as instructed   hydrochlorothiazide  (HYDRODIURIL ) 25 MG tablet Take 1 tablet (25 mg total) by mouth daily.   insulin  glargine (LANTUS ) 100 UNIT/ML injection Inject 0.34 mLs (34 Units total) into the skin daily.   losartan  (COZAAR ) 25 MG tablet Take 1 tablet (25 mg total) by mouth daily.   metFORMIN  (GLUCOPHAGE ) 500 MG tablet Take 2 tablets (1,000 mg total) by mouth 2 (two) times daily with a meal.   metoprolol  succinate (TOPROL -XL) 50 MG 24 hr tablet Take 1 tablet (50 mg total) by mouth daily. Take with or immediately following a meal.   ondansetron  (ZOFRAN ) 4 MG tablet Take 1 tablet (4 mg total) by mouth every 8 (eight) hours as needed for nausea or vomiting.   rosuvastatin  (CRESTOR ) 40 MG tablet Take 1 tablet (40 mg total) by mouth daily.   cyclobenzaprine  (FLEXERIL ) 10 MG tablet Take 1 tablet (10 mg total) by mouth 2 (two) times daily as needed for muscle spasms. (Patient not taking: Reported on 05/24/2023)   docusate sodium  (COLACE) 100 MG capsule Take 1 capsule (100 mg total) by mouth 2 (two) times daily. (Patient not taking: Reported on 05/24/2023)   Insulin  Syringe-Needle U-100 (INSULIN  SYRINGE .5CC/31GX5/16") 31G X 5/16" 0.5 ML MISC Use to inject insulin  daily (Patient not taking: Reported on 02/09/2023)   omeprazole  (PRILOSEC) 20 MG capsule Take 1 capsule (20 mg total) by mouth daily. (Patient not taking: Reported on 05/24/2023)   No facility-administered encounter medications on file as of 05/24/2023.    Review of Systems  Review of Systems  Constitutional: Negative.   HENT: Negative.    Cardiovascular: Negative.   Gastrointestinal: Negative.   Allergic/Immunologic: Negative.   Neurological: Negative.   Psychiatric/Behavioral: Negative.       Objective:   BP (!) 164/67   Pulse 70   Temp 97.9 F (36.6 C)   Wt 211 lb 6.4 oz (95.9 kg)   SpO2 100%   BMI 38.67 kg/m   Wt Readings from Last 5 Encounters:  05/24/23 211 lb  6.4 oz (95.9 kg)  04/16/23 207 lb (93.9 kg)  01/19/23 206 lb (93.4 kg)  01/15/23 206 lb 6.4 oz (93.6 kg)  12/04/22 205 lb (93 kg)     Physical Exam Vitals and nursing note reviewed.  Constitutional:      General: She is not in acute distress.    Appearance: She is well-developed.  Cardiovascular:     Rate and Rhythm: Normal rate and regular rhythm.  Pulmonary:     Effort: Pulmonary effort is normal.     Breath sounds: Normal breath sounds.  Neurological:     Mental Status: She is alert and oriented to person, place, and time.       Assessment & Plan:   Type 2 diabetes mellitus with complication, with long-term current use of insulin  (HCC) -     POCT glycosylated hemoglobin (Hb A1C) -     Ambulatory referral to Endocrinology -     AMB Referral VBCI Care Management     Return in about 3 months (around 08/21/2023).     Jerrlyn Morel, NP 05/24/2023

## 2023-05-24 NOTE — Patient Instructions (Addendum)
 1. Type 2 diabetes mellitus with complication, with long-term current use of insulin  (HCC) (Primary)  - POCT glycosylated hemoglobin (Hb A1C) - Ambulatory referral to Endocrinology   Follow up:  Follow up in 3 months

## 2023-05-26 ENCOUNTER — Encounter (HOSPITAL_COMMUNITY): Payer: Self-pay

## 2023-05-31 ENCOUNTER — Other Ambulatory Visit: Payer: Self-pay

## 2023-05-31 ENCOUNTER — Other Ambulatory Visit: Payer: Self-pay | Admitting: Nurse Practitioner

## 2023-05-31 ENCOUNTER — Other Ambulatory Visit: Payer: Self-pay | Admitting: Obstetrics and Gynecology

## 2023-05-31 MED ORDER — INSULIN SYRINGE 31G X 5/16" 0.5 ML MISC
2 refills | Status: DC
  Filled 2023-05-31: qty 100, 30d supply, fill #0
  Filled 2023-09-27 – 2023-10-14 (×6): qty 100, 30d supply, fill #1
  Filled 2024-01-22: qty 100, 30d supply, fill #2

## 2023-05-31 NOTE — Patient Instructions (Signed)
 Visit Information  [name] was given information about Medicaid Managed Care team care coordination services as a part of their [facility] Community Plan Medicaid benefit. [name] verbally consented to engagement with the [facility] Managed Care team.   If you are experiencing a medical emergency, please call 911 or report to your local emergency department or urgent care.   If you have a non-emergency medical problem during routine business hours, please contact your provider's office and ask to speak with a nurse.   For questions related to your [facility], please call: [phone] or visit the homepage here: [email or web address]  If you would like to schedule transportation through your [facility], please call the following number at least 2 days in advance of your appointment: [phone]   Rides for urgent appointments can also be made after hours by calling Member Services.  Call the Behavioral Health Crisis Line at [phone], at any time, 24 hours a day, 7 days a week. If you are in danger or need immediate medical attention call 911.  If you would like help to quit smoking, call 1-800-QUIT-NOW (8057025377) OR Espaol: 1-855-Djelo-Ya (7-253-664-4034) o para ms informacin haga clic aqu or Text READY to 742-595 to register via text  [name] - following are the goals we discussed in your visit today:   Goals Addressed             This Visit's Progress    Establish Plan of Care for Chronic Disease Management Needs       Timeframe:  Long-Range Goal Priority:  High Start Date:     02/12/23                        Expected End Date:     ongoing                  Follow Up Date 06/28/23   - schedule appointment for vaccines needed due to my age or health - schedule recommended health tests (blood work, mammogram, colonoscopy, pap test) - schedule and keep  appointment for annual check-up    Why is this important?   Screening tests can find diseases early when they are easier to treat.  Your doctor or nurse will talk with you about which tests are important for you.  Getting shots for common diseases like the flu and shingles will help prevent them.   05/31/23: PET 2/26 and PULM 3/18   Patient verbalizes understanding of instructions and care plan provided today and agrees to view in MyChart. Active MyChart status and patient understanding of how to access instructions and care plan via MyChart confirmed with patient.     The Managed Medicaid care management team will reach out to the patient again over the next 30 business  days.  The  Patient  has been provided with contact information for the Managed Medicaid care management team and has been advised to call with any health related questions or concerns.   Kathi Der RN, BSN, [employer] [facility] Health RN Care Manager Direct Dial [phone]/RJJ [phone] Website: [email or web address]   Following is a copy of your plan of care:  Care Plan : General Plan of Care (Adult)  Updates made by Danie Chandler, RN since 05/31/2023 12:00 AM     Problem: Health Promotion or Disease Self-Management (General Plan of Care)      Long-Range Goal: Chronic Disease Management and Care  Coordination Needs   Start Date: 02/12/2023  Expected End Date: 07/29/2023  Priority: High  Note:   Current Barriers:  Knowledge Deficits related to plan of care for management of HTN, DM, osteoarthritis, obesity, anxiety, constipation, sarcoidosis Care Coordination needs related to HTN, DM, osteoarthritis, obesity, anxiety, constipation, sarcoidosis Chronic Disease Management support and education needs related to HTN, DM, osteoarthritis, obesity, anxiety, constipation, sardoiosis Financial Constraints  Difficulty obtaining medications 05/31/23: PET scan 2/26.  Using walker.  Awaiting resources.  No  constipation.  Has not heard from ENDO referral yet.  Knee xray completed.  RNCM Clinical Goal(s):  verbalize understanding of plan for management of HTN, DM, osteoarthritis, obesity, anxiety, constipation, sarcoidosis as evidenced by patient report verbalize basic understanding of  HTN, DM, osteoarthritis, obesity, anxiety, constipation, sarcoidosis disease process and self health management plan as evidenced by patient report. take all medications exactly as prescribed and will call provider for medication related questions as evidenced by patient report demonstrate understanding of rationale for each prescribed medication as evidenced by patient report attend all scheduled medical appointments as evidenced by patient report. demonstrate ongoing  adherence to prescribed treatment plan for HTN, DM, osteoarthritis, obesity, anxiety, constipation, sarcoidosis   as evidenced by patient report. continue to work with RN Care Manager to address care management and care coordination needs related to HTN, DM, osteoarthritis, obesity, anxiety, constipation, sarcoidosis as evidenced by adherence to CM Team Scheduled appointments work with pharmacist to address medication needs related to  HTN, DM, osteoarthritis, obesity, anxiety, constipation, sarcoidosis  as evidenced by review or EMR and patient or pharmacist report work with Child psychotherapist to address resources needed related to the management of financial constraints  related to the management of HTN, DM, osteoarthritis, obesity, anxiety, constipation, sarcoidosis as evidenced by review of EMR and patient or Child psychotherapist report through collaboration with Medical illustrator, provider, and care team.   Interventions: Evaluation of current treatment plan related to  self management and patient's adherence to plan as established by provider 04/30/23: :  BSW appt rescheduled.   Diabetes Interventions:  (Status:  New goal.) Long Term Goal Assessed patient's  understanding of A1c goal: <7% Reviewed medications with patient and discussed importance of medication adherence Counseled on importance of regular laboratory monitoring as prescribed Discussed plans with patient for ongoing care management follow up and provided patient with direct contact information for care management team Reviewed scheduled/upcoming provider appointments Advised patient, providing education and rationale, to check cbg as directed  and record, calling provider  for findings outside established parameters Referral made to pharmacy team for assistance with medications  Referral made to social work team for assistance with resources  Review of patient status, including review of consultants reports, relevant laboratory and other test results, and medications completed Assessed social determinant of health barriers Lab Results  Component Value Date   HGBA1C 9.4 05/24/23   Hypertension Interventions:  (Status:  New goal.) Long Term Goal Last practice recorded BP readings:  BP Readings from Last 3 Encounters:  05/31/23      134/70  04/16/23           114/72  02/12/23                132/63   Most recent eGFR/CrCl:  Lab Results  Component Value Date   EGFR 47 (L) 10/16/2022    No components found for: "CRCL"  Evaluation of current treatment plan related to hypertension self management and patient's adherence to plan as  established by provider Reviewed medications with patient and discussed importance of compliance Discussed plans with patient for ongoing care management follow up and provided patient with direct contact information for care management team Advised patient, providing education and rationale, to monitor blood pressure daily and record, calling PCP for findings outside established parameters Reviewed scheduled/upcoming provider appointments including:  Assessed social determinant of health barriers  SDOH Barriers (Status:  New goal.) Long Term  Goal Patient interviewed and SDOH assessment performed        SDOH Interventions    Flowsheet Row Patient Outreach Telephone from 02/12/2023 in [place] POPULATION HEALTH DEPARTMENT Office Visit from 07/29/2022 in [facility]  SDOH Interventions    Food Insecurity Interventions -- Other (Comment)  Utilities Interventions Other (Comment)  [patient has set up a payment plan] --  Financial Strain Interventions -- Other (Comment)  Health Literacy Interventions Intervention Not Indicated --      Patient interviewed and appropriate assessments performed Discussed plans with patient for ongoing care management follow up and provided patient with direct contact information for care management team Advised patient to f/u with provider regarding cane-completed 04/01/23:  Patient has received resources from BSW-f/u scheduled. 04/30/23:  Patient needs eye resources-BSW appt rescheduled.  05/31/23: Patient to f/u on ENDO referral  Patient Goals/Self-Care Activities: Notify RN Care Manager of [facility] call rescheduling needs Take all medications as prescribed Attend all scheduled provider appointments Call pharmacy for medication refills 3-7 days in advance of running out of medications Perform all self care activities independently  Perform IADL's (shopping, preparing meals, housekeeping, managing finances) independently Call provider office for new concerns or questions  Work with the social worker to address care coordination needs and will continue to work with the clinical team to address health care and disease management related needs 04/01/23:  patient to f/u with legal aid if wishes.  Follow Up Plan:  The patient has been provided with contact information for the care management team and has been advised to call with any health related questions or concerns.  The care management team will reach out to the patient again over the next 30 business  days.

## 2023-05-31 NOTE — Patient Outreach (Signed)
 Medicaid Managed Care   Nurse Care Manager Note  05/31/2023 Name:  Lajean Boese MRN:  [id number] DOB:  04-08-1961  Wanda Weaver is an 63 y.o. year old female who is a primary patient of [name], NP.  [facility] Managed Care Coordination team was consulted for assistance with:    Chronic healthcare management needs, HTN, DM, sarcoidosis, osteoarthritis, obesity,  h/o constipation, peripheral neuropathy  Ms. Cocco was given information about Medicaid Managed Care Coordination team services today. [name] Patient agreed to services and verbal consent obtained.  Engaged with patient by telephone for follow up visit in response to provider referral for case management and/or care coordination services.   Patient is participating in a Managed Medicaid Plan:  Yes  Assessments/Interventions:  Review of past medical history, allergies, medications, health status, including review of consultants reports, laboratory and other test data, was performed as part of comprehensive evaluation and provision of chronic care management services.  SDOH (Social Drivers of Health) assessments and interventions performed: SDOH Interventions    Flowsheet Row Patient Outreach Telephone from 05/31/2023 in [place] POPULATION HEALTH DEPARTMENT Office Visit from 05/24/2023 in [facility] - A Dept Of Eligha Bridegroom [facility] Patient Outreach Telephone from 04/30/2023 in [place] POPULATION HEALTH DEPARTMENT Patient Outreach Telephone from 04/01/2023 in [place] POPULATION HEALTH DEPARTMENT Patient Outreach Telephone from 02/12/2023 in [place] POPULATION HEALTH DEPARTMENT Office Visit from 07/29/2022 in [facility] - A Dept Of [facility]  SDOH Interventions        Food Insecurity Interventions -- -- -- -- -- Other (Comment)  Utilities Interventions -- -- -- -- Other (Comment)  [patient has set up a payment plan] --  Alcohol Usage Interventions -- -- --  Intervention Not Indicated (Score <7) -- --  Depression Interventions/Treatment  -- PHQ2-9 Score <4 Follow-up Not Indicated -- -- -- --  Financial Strain Interventions Other (Comment)  [sent utility resources] -- -- -- -- Other (Comment)  Physical Activity Interventions -- -- -- Intervention Not Indicated, Other (Comments)  [physically not able to engage in this type of exercise] -- --  Stress Interventions -- -- Intervention Not Indicated -- -- --  Health Literacy Interventions -- -- -- -- Intervention Not Indicated --     Care Plan Allergies  Allergen Reactions   Ibuprofen Palpitations    Medications Reviewed Today     Reviewed by Danie Chandler, RN (Registered Nurse) on 05/31/23 at 1454  Med List Status: <None>   Medication Order Taking? Sig Documenting Provider Last Dose Status Informant  Accu-Chek Softclix Lancets lancets [id number] No Use as instructed [name], NP Taking Active   albuterol (VENTOLIN HFA) 108 (90 Base) MCG/ACT inhaler [id number] No Inhale 2 puffs into the lungs every 6 (six) hours as needed. [name], NP Taking Active   blood glucose meter kit and supplies KIT [id number] No Use up to four times daily as directed. [name], NP Taking Active   cyclobenzaprine (FLEXERIL) 10 MG tablet [id number] No Take 1 tablet (10 mg total) by mouth 2 (two) times daily as needed for muscle spasms.  Patient not taking: Reported on 05/24/2023   Fayrene Helper, PA-C Not Taking Active   docusate sodium (COLACE) 100 MG capsule [id number] No Take 1 capsule (100 mg total) by mouth 2 (two) times daily.  Patient not taking: Reported on 05/24/2023   [name], NP Not Taking Active  Elastic Bandages & Supports (KNEE BRACE ADJUSTABLE HINGED) MISC [id number] No 1 application by Does not apply route daily. Barbette Merino, NP Taking Active   glucose blood test strip [id number] No Use as instructed [name], NP Taking Active   hydrochlorothiazide (HYDRODIURIL) 25 MG  tablet [id number] No Take 1 tablet (25 mg total) by mouth daily. [name], NP Taking Active   insulin glargine (LANTUS) 100 UNIT/ML injection [id number] No Inject 0.34 mLs (34 Units total) into the skin daily. [name], NP Taking Active   Insulin Syringe-Needle U-100 (INSULIN SYRINGE .5CC/31GX5/16") 31G X 5/16" 0.5 ML MISC [id number]  Use to inject insulin daily [name], NP  Active   losartan (COZAAR) 25 MG tablet [id number] No Take 1 tablet (25 mg total) by mouth daily. [name], NP Taking Active   metFORMIN (GLUCOPHAGE) 500 MG tablet [id number] No Take 2 tablets (1,000 mg total) by mouth 2 (two) times daily with a meal. [name], NP Taking Active   metoprolol succinate (TOPROL-XL) 50 MG 24 hr tablet [id number] No Take 1 tablet (50 mg total) by mouth daily. Take with or immediately following a meal. [name], NP Taking Active   omeprazole (PRILOSEC) 20 MG capsule [id number] No Take 1 capsule (20 mg total) by mouth daily.  Patient not taking: Reported on 05/24/2023   [name], NP Not Taking Active   ondansetron (ZOFRAN) 4 MG tablet [id number] No Take 1 tablet (4 mg total) by mouth every 8 (eight) hours as needed for nausea or vomiting. Donell Beers, FNP Taking Active            Med Note [name]   Wed Apr 22, 2022  8:57 AM) prn  rosuvastatin (CRESTOR) 40 MG tablet [id number] No Take 1 tablet (40 mg total) by mouth daily. [name], NP Taking Active            Patient Active Problem List   Diagnosis Date Noted   Paresthesia 10/22/2022   Gait abnormality 10/22/2022   Near syncope 09/14/2022   Adhesive capsulitis of left shoulder 07/24/2022   Constipation 05/25/2022   Osteoarthritis of right shoulder 04/22/2022   Bilateral lower extremity pain 08/20/2019   Bilateral lower extremity edema 08/20/2019   Hemoglobin A1C greater than 9%, indicating poor diabetic control 02/21/2019   Weight loss 02/21/2019   Muscle strain of  wrist, left, initial encounter 09/22/2018   Hyperglycemia 08/15/2018   Essential hypertension 04/25/2018   Class 1 obesity due to excess calories with serious comorbidity in adult 04/25/2018   Right foot pain 04/25/2018   Anxiety 04/25/2018   Type 2 diabetes mellitus with complication, with long-term current use of insulin (HCC) 08/06/2017   Conditions to be addressed/monitored per PCP order:  Chronic healthcare management needs, HTN, DM, sarcoidosis, osteoarthritis, obesity,   Care Plan : General Plan of Care (Adult)  Updates made by Danie Chandler, RN since 05/31/2023 12:00 AM     Problem: Health Promotion or Disease Self-Management (General Plan of Care)      Long-Range Goal: Chronic Disease Management and Care Coordination Needs   Start Date: 02/12/2023  Expected End Date: 07/29/2023  Priority: High  Note:   Current Barriers:  Knowledge Deficits related to plan of care for management of HTN, DM, osteoarthritis, obesity, anxiety, constipation, sarcoidosis Care Coordination needs related to HTN, DM, osteoarthritis, obesity, anxiety, constipation, sarcoidosis Chronic Disease Management support and education needs related to HTN, DM, osteoarthritis, obesity, anxiety,  constipation, sardoiosis Financial Constraints  Difficulty obtaining medications 05/31/23: PET scan 2/26.  Using walker.  Awaiting resources.  No constipation.  Has not heard from ENDO referral yet.  Knee xray completed.  RNCM Clinical Goal(s):  verbalize understanding of plan for management of HTN, DM, osteoarthritis, obesity, anxiety, constipation, sarcoidosis as evidenced by patient report verbalize basic understanding of  HTN, DM, osteoarthritis, obesity, anxiety, constipation, sarcoidosis disease process and self health management plan as evidenced by patient report. take all medications exactly as prescribed and will call provider for medication related questions as evidenced by patient report demonstrate  understanding of rationale for each prescribed medication as evidenced by patient report attend all scheduled medical appointments as evidenced by patient report. demonstrate ongoing  adherence to prescribed treatment plan for HTN, DM, osteoarthritis, obesity, anxiety, constipation, sarcoidosis   as evidenced by patient report. continue to work with RN Care Manager to address care management and care coordination needs related to HTN, DM, osteoarthritis, obesity, anxiety, constipation, sarcoidosis as evidenced by adherence to CM Team Scheduled appointments work with pharmacist to address medication needs related to  HTN, DM, osteoarthritis, obesity, anxiety, constipation, sarcoidosis  as evidenced by review or EMR and patient or pharmacist report work with Child psychotherapist to address resources needed related to the management of financial constraints  related to the management of HTN, DM, osteoarthritis, obesity, anxiety, constipation, sarcoidosis as evidenced by review of EMR and patient or Child psychotherapist report through collaboration with Medical illustrator, provider, and care team.   Interventions: Evaluation of current treatment plan related to  self management and patient's adherence to plan as established by provider 04/30/23: :  BSW appt rescheduled.   Diabetes Interventions:  (Status:  New goal.) Long Term Goal Assessed patient's understanding of A1c goal: <7% Reviewed medications with patient and discussed importance of medication adherence Counseled on importance of regular laboratory monitoring as prescribed Discussed plans with patient for ongoing care management follow up and provided patient with direct contact information for care management team Reviewed scheduled/upcoming provider appointments Advised patient, providing education and rationale, to check cbg as directed  and record, calling provider  for findings outside established parameters Referral made to pharmacy team for assistance  with medications  Referral made to social work team for assistance with resources  Review of patient status, including review of consultants reports, relevant laboratory and other test results, and medications completed Assessed social determinant of health barriers Lab Results  Component Value Date   HGBA1C 9.4 05/24/23   Hypertension Interventions:  (Status:  New goal.) Long Term Goal Last practice recorded BP readings:  BP Readings from Last 3 Encounters:  05/31/23      134/70  04/16/23           114/72  02/12/23                132/63   Most recent eGFR/CrCl:  Lab Results  Component Value Date   EGFR 47 (L) 10/16/2022    No components found for: "CRCL"  Evaluation of current treatment plan related to hypertension self management and patient's adherence to plan as established by provider Reviewed medications with patient and discussed importance of compliance Discussed plans with patient for ongoing care management follow up and provided patient with direct contact information for care management team Advised patient, providing education and rationale, to monitor blood pressure daily and record, calling PCP for findings outside established parameters Reviewed scheduled/upcoming provider appointments including:  Assessed social determinant of health barriers  SDOH Barriers (Status:  New goal.) Long Term Goal Patient interviewed and SDOH assessment performed        SDOH Interventions    Flowsheet Row Patient Outreach Telephone from 02/12/2023 in [place] POPULATION HEALTH DEPARTMENT Office Visit from 07/29/2022 in [facility]  SDOH Interventions    Food Insecurity Interventions -- Other (Comment)  Utilities Interventions Other (Comment)  [patient has set up a payment plan] --  Financial Strain Interventions -- Other (Comment)  Health Literacy Interventions Intervention Not Indicated --      Patient interviewed and appropriate assessments  performed Discussed plans with patient for ongoing care management follow up and provided patient with direct contact information for care management team Advised patient to f/u with provider regarding cane-completed 04/01/23:  Patient has received resources from BSW-f/u scheduled. 04/30/23:  Patient needs eye resources-BSW appt rescheduled.  05/31/23: Patient to f/u on ENDO referral  Patient Goals/Self-Care Activities: Notify RN Care Manager of [facility] call rescheduling needs Take all medications as prescribed Attend all scheduled provider appointments Call pharmacy for medication refills 3-7 days in advance of running out of medications Perform all self care activities independently  Perform IADL's (shopping, preparing meals, housekeeping, managing finances) independently Call provider office for new concerns or questions  Work with the social worker to address care coordination needs and will continue to work with the clinical team to address health care and disease management related needs 04/01/23:  patient to f/u with legal aid if wishes.  Follow Up Plan:  The patient has been provided with contact information for the care management team and has been advised to call with any health related questions or concerns.  The care management team will reach out to the patient again over the next 30 business  days.     Follow Up:  Patient agrees to Care Plan and Follow-up.  Plan: The Managed Medicaid care management team will reach out to the patient again over the next 30 business  days. and The  Patient has been provided with contact information for the Managed Medicaid care management team and has been advised to call with any health related questions or concerns.  Date/time of next scheduled RN care management/care coordination outreach: 06/28/23 at 1230

## 2023-06-01 ENCOUNTER — Other Ambulatory Visit: Payer: Self-pay

## 2023-06-03 ENCOUNTER — Ambulatory Visit (HOSPITAL_BASED_OUTPATIENT_CLINIC_OR_DEPARTMENT_OTHER): Payer: Medicaid Other | Admitting: Pulmonary Disease

## 2023-06-03 ENCOUNTER — Other Ambulatory Visit: Payer: Self-pay

## 2023-06-03 NOTE — Patient Outreach (Signed)
  Medicaid Managed Care   Unsuccessful Outreach Note  06/03/2023 Name: Wanda Weaver MRN: [id number] DOB: 1961-03-13  Referred by: [name], NP Reason for referral : High Risk Managed Medicaid (MM Social work unsuccessful telephone outreach)   An unsuccessful telephone outreach was attempted today. The patient was referred to the case management team for assistance with care management and care coordination.   Follow Up Plan: A HIPAA compliant phone message was left for the patient providing contact information and requesting a return call.   Abelino Derrick, MHA [facility]  Managed [facility] Social Worker [phone]

## 2023-06-03 NOTE — Patient Instructions (Signed)
  Medicaid Managed Care   Unsuccessful Outreach Note  06/03/2023 Name: Wanda Weaver MRN: [id number] DOB: 1961-03-13  Referred by: [name], NP Reason for referral : High Risk Managed Medicaid (MM Social work unsuccessful telephone outreach)   An unsuccessful telephone outreach was attempted today. The patient was referred to the case management team for assistance with care management and care coordination.   Follow Up Plan: A HIPAA compliant phone message was left for the patient providing contact information and requesting a return call.   Abelino Derrick, MHA [facility]  Managed [facility] Social Worker [phone]

## 2023-06-07 ENCOUNTER — Other Ambulatory Visit: Payer: Self-pay

## 2023-06-07 ENCOUNTER — Telehealth (HOSPITAL_COMMUNITY): Payer: Self-pay | Admitting: *Deleted

## 2023-06-07 NOTE — Telephone Encounter (Signed)
Reaching out to patient to offer assistance regarding upcoming cardiac imaging study; pt verbalizes understanding of appt date/time, parking situation and where to check in, pre-test NPO status and verified current allergies; name and call back number provided for further questions should they arise  Merle Prescott RN Navigator Cardiac Imaging [facility] [phone] office [phone] cell  Patient verbalized understanding of diet prep.   

## 2023-06-07 NOTE — Telephone Encounter (Signed)
 Attempted to call patient regarding upcoming cardiac PET appointment. Left message on voicemail with name and callback number  Larey Brick RN Navigator Cardiac Imaging [facility] [phone] Office [phone] Cell

## 2023-06-08 ENCOUNTER — Other Ambulatory Visit: Payer: Self-pay

## 2023-06-09 ENCOUNTER — Other Ambulatory Visit (HOSPITAL_COMMUNITY): Payer: Medicaid Other

## 2023-06-09 ENCOUNTER — Encounter (HOSPITAL_COMMUNITY): Admission: RE | Admit: 2023-06-09 | Payer: Medicaid Other | Source: Ambulatory Visit

## 2023-06-10 ENCOUNTER — Other Ambulatory Visit: Payer: Self-pay

## 2023-06-10 DIAGNOSIS — Z79899 Other long term (current) drug therapy: Secondary | ICD-10-CM

## 2023-06-10 NOTE — Progress Notes (Signed)
 06/10/2023 Name: Wanda Weaver MRN: [id number] DOB: [birth year]  Chief Complaint  Patient presents with   Diabetes    Wanda Weaver is a 63 y.o. year old female who presented for a telephone visit.   They were referred to the pharmacist by their PCP for assistance in managing diabetes.    Subjective:  Care Team: Primary Care Provider: [name], NP ; Next Scheduled Visit: 08/23/2023   Medication Access/Adherence  Current Pharmacy:  [facility] [address], [address] [place] [address] Phone: [phone] Fax: [phone]   Patient reports affordability concerns with their medications: Yes  $4 copay for medications  Patient reports access/transportation concerns to their pharmacy: No  Patient reports adherence concerns with their medications:  Yes  When insulin is refilled too soon and sometimes there is a gap of 2-3 days of no medications the patient is taking. She relies on boyfriend to pick up medication. Insulin is coming back as refilled sooner than it is prescribed and this is a frequent issues. She reports only receiving one box and reports that she is administering the appropriate dose. However, patient was previously connected to a case worker for financial constraints.    Diabetes:  Current medications: Lantus 34 units daily and metformin 1000 mg twice daily  Medications tried in the past: Humalog, Jardiance,Glipizide, Levemir, Basaglar, Ozempic  Current glucose readings: She was unable to provide a log of readings and mentioned that she was still interested in CGM, but it was denied last year. Per chart review and patient, she has Medicaid.  Patient feels current glucometer is inaccurate as it has different readings when test within a short separate time frame Today-- fasting 142;  2- hr post prandial: 196 mg/dL     Objective:  Lab Results  Component Value Date   HGBA1C 9.4 (A) 05/24/2023    Lab  Results  Component Value Date   CREATININE 1.29 (H) 01/19/2023   BUN 22 01/19/2023   NA 136 01/19/2023   K 4.1 01/19/2023   CL 98 01/19/2023   CO2 25 01/19/2023    Lab Results  Component Value Date   CHOL 221 (H) 05/08/2022   HDL 47 05/08/2022   LDLCALC 150 (H) 05/08/2022   TRIG 131 05/08/2022   CHOLHDL 4.7 (H) 05/08/2022    Medications Reviewed Today     Reviewed by Katha Cabal, RPH (Pharmacist) on 06/10/23 at 1123  Med List Status: <None>   Medication Order Taking? Sig Documenting Provider Last Dose Status Informant  Accu-Chek Softclix Lancets lancets [id number] Yes Use as instructed [name], NP Taking Active            Med Note Para March, [facility] R   Thu Jun 10, 2023 11:15 AM) Supply  albuterol (VENTOLIN HFA) 108 (90 Base) MCG/ACT inhaler [id number] Yes Inhale 2 puffs into the lungs every 6 (six) hours as needed. [name], NP Taking Active   blood glucose meter kit and supplies KIT [id number] Yes Use up to four times daily as directed. [name], NP Taking Active            Med Note Para March, [facility] R   Thu Jun 10, 2023 11:15 AM) Supply  cyclobenzaprine (FLEXERIL) 10 MG tablet [id number] No Take 1 tablet (10 mg total) by mouth 2 (two) times daily as needed for muscle spasms.  Patient not taking: Reported on 06/10/2023   Fayrene Helper, PA-C Not Taking Active  docusate sodium (COLACE) 100 MG capsule [id number] No Take 1 capsule (100 mg total) by mouth 2 (two) times daily.  Patient not taking: Reported on 06/10/2023   [name], NP Not Taking Active   Elastic Bandages & Supports (KNEE BRACE ADJUSTABLE HINGED) MISC [id number] Yes 1 application by Does not apply route daily. Barbette Merino, NP Taking Active            Med Note Cephus Shelling R   Thu Jun 10, 2023 11:19 AM) Supply  glucose blood test strip [id number] Yes Use as instructed [name], NP Taking Active            Med Note Para March, [facility] R   Thu Jun 10, 2023 11:19 AM) supply   hydrochlorothiazide (HYDRODIURIL) 25 MG tablet [id number] Yes Take 1 tablet (25 mg total) by mouth daily. [name], NP Taking Active   insulin glargine (LANTUS) 100 UNIT/ML injection [id number] Yes Inject 0.34 mLs (34 Units total) into the skin daily. [name], NP Taking Active   Insulin Syringe-Needle U-100 (INSULIN SYRINGE .5CC/31GX5/16") 31G X 5/16" 0.5 ML MISC [id number] Yes Use to inject insulin daily [name], NP Taking Active            Med Note Para March, [facility] R   Thu Jun 10, 2023 11:21 AM) Supply  losartan (COZAAR) 25 MG tablet [id number] Yes Take 1 tablet (25 mg total) by mouth daily. [name], NP Taking Active   metFORMIN (GLUCOPHAGE) 500 MG tablet [id number] Yes Take 2 tablets (1,000 mg total) by mouth 2 (two) times daily with a meal. [name], NP Taking Active   metoprolol succinate (TOPROL-XL) 50 MG 24 hr tablet [id number] Yes Take 1 tablet (50 mg total) by mouth daily. Take with or immediately following a meal. [name], NP Taking Active   omeprazole (PRILOSEC) 20 MG capsule [id number] No Take 1 capsule (20 mg total) by mouth daily.  Patient not taking: Reported on 06/10/2023   [name], NP Not Taking Active   ondansetron [facility]) 4 MG tablet [id number] Yes Take 1 tablet (4 mg total) by mouth every 8 (eight) hours as needed for nausea or vomiting. Donell Beers, FNP Taking Active            Med Note Para March, [facility] R   Thu Jun 10, 2023 11:20 AM) Taking PRN  rosuvastatin (CRESTOR) 40 MG tablet [id number] Yes Take 1 tablet (40 mg total) by mouth daily. [name], NP Taking Active               Assessment/Plan:  Unfortunately, we unable to discuss diet, exercise, and testing frequency during the 45 minute call as something came up. Patient stated that I could call her back later in the day, but she did not answer the phone after two separate outreaches.   Diabetes: - Currently uncontrolled. Ms. Baehr reports that  Lantus pens hurt too much which is why she prefers the vial (which is supposed to last for 28 days). However, per Dr. Tiajuana Amass the prescription was being filled for 29 days supply for most of the first half of last year. Will need to verify administration technique with the patient. Additionally, she feels that her current glucometer is inaccurate and states that she has brought it into the clinic to show PCP who noted the inaccuracy as well.  -Will follow up on trying to get patient a new glucometer.     Medication Management:  Patient reports that she cannot swallow pills. She was taking gummy MVI, but was told to d/c due to risk of increasing blood glucose. Patient also reported that she crushed metformin. - Verified patient can crush metformin as it is not time-released  - Splitting rosuvastatin in 4 pieces with a pill cutter-- discussed possibly splitting in half to avoid losing medicine but not one-fourths. Alternatively, she is open to taking two 20 mg rosuvastatin mg tablets. Will first try splitting in half to see if patient is able to swallow medication. It is unclear if patient has dysphagia.    Follow Up Plan:1-2 weeks    Time spent: 45 minutes  Thank you for allowing pharmacy to be a part of this patient's care. Cephus Shelling, PharmD Clinical Pharmacist Cell: [phone]

## 2023-06-14 NOTE — Patient Instructions (Addendum)
 Hi Ms. Lesure,   Please consider splitting rosuvastatin in half before we send a prescription for taking two rosuvastatin 20 mg tablets once daily. Will try to see if your insurance will cover a CGM.   Thank you for allowing pharmacy to be a part of this patient's care. Cephus Shelling, PharmD Clinical Pharmacist Cell: [phone]

## 2023-06-25 ENCOUNTER — Telehealth: Payer: Self-pay

## 2023-06-25 NOTE — Progress Notes (Signed)
 Attempted to contact patient for medication management/review. Left HIPAA compliant message for patient to return my call at their convenience.   Second attempt for patient outreach; previously called patient back the same day to continue the encounter and have not been able to reach. Will follow up with patient in 7-14 business days. Next PCP appointment 08/23/2023. Will continue to outreach patient to possible start her on a CGM with her new insurance.   Thank you for allowing pharmacy to be a part of this patient's care.  Cephus Shelling, PharmD Clinical Pharmacist Cell: [phone]

## 2023-06-28 ENCOUNTER — Other Ambulatory Visit: Payer: Self-pay | Admitting: Obstetrics and Gynecology

## 2023-06-28 NOTE — Patient Instructions (Signed)
 Visit Information  [name] was given information about Medicaid Managed Care team care coordination services as a part of their [facility] Community Plan Medicaid benefit. Wanda Weaver verbally consented to engagement with the [facility] Managed Care team.   If you are experiencing a medical emergency, please call 911 or report to your local emergency department or urgent care.   If you have a non-emergency medical problem during routine business hours, please contact your provider's office and ask to speak with a nurse.   For questions related to your [facility], please call: [phone] or visit the homepage here: [email or web address]  If you would like to schedule transportation through your [facility], please call the following number at least 2 days in advance of your appointment: [phone]   Rides for urgent appointments can also be made after hours by calling Member Services.  Call the Behavioral Health Crisis Line at [phone], at any time, 24 hours a day, 7 days a week. If you are in danger or need immediate medical attention call 911.  If you would like help to quit smoking, call 1-800-QUIT-NOW ((316)587-4725) OR Espaol: 1-855-Djelo-Ya (1-324-401-0272) o para ms informacin haga clic aqu or Text READY to 536-644 to register via text  [name] - following are the goals we discussed in your visit today:   Goals Addressed             This Visit's Progress    Establish Plan of Care for Chronic Disease Management Needs       Timeframe:  Long-Range Goal Priority:  High Start Date:     02/12/23                        Expected End Date:     ongoing                  Follow Up Date 07/29/23   - schedule appointment for vaccines needed due to my age or health - schedule recommended health tests (blood work, mammogram, colonoscopy, pap test) - schedule and keep  appointment for annual check-up    Why is this important?   Screening tests can find diseases early when they are easier to treat.  Your doctor or nurse will talk with you about which tests are important for you.  Getting shots for common diseases like the flu and shingles will help prevent them.   06/28/23: PET 4/9.  Patient to f/u on ENDO referral and speak with Pharmacy regarding CBG.   The patient verbalized understanding of instructions, educational materials, and care plan provided today and DECLINED offer to receive copy of patient instructions, educational materials, and care plan.   The Managed Medicaid care management team will reach out to the patient again over the next 30 business days.  The  Patient  has been provided with contact information for the Managed Medicaid care management team and has been advised to call with any health related questions or concerns.   Wanda Weaver RNC-MNN< BSN, [employer] [facility] RN Care Manager Direct Dial [phone]/LOV [phone] Website: [email or web address]   Following is a copy of your plan of care:  Care Plan : General Plan of Care (Adult)  Updates made by Danie Chandler, RN since 06/28/2023 12:00 AM     Problem: Health Promotion or Disease Self-Management (General Plan of Care)      Long-Range Goal: Chronic Disease Management and Care Coordination  Needs   Start Date: 02/12/2023  Expected End Date: 09/28/2023  Priority: High  Note:   Current Barriers:  Knowledge Deficits related to plan of care for management of HTN, DM, osteoarthritis, obesity, anxiety, constipation, sarcoidosis Care Coordination needs related to HTN, DM, osteoarthritis, obesity, anxiety, constipation, sarcoidosis Chronic Disease Management support and education needs related to HTN, DM, osteoarthritis, obesity, anxiety, constipation, sardoiosis Financial Constraints  Difficulty obtaining medications 06/28/23: patient to have PET scan 4/9.   BP stable.  Blood glucose elevated with increased pain-to f/u regarding ENDO referral.  Has NEURO appt 3/20.  RNCM Clinical Goal(s):  verbalize understanding of plan for management of HTN, DM, osteoarthritis, obesity, anxiety, constipation, sarcoidosis as evidenced by patient report verbalize basic understanding of  HTN, DM, osteoarthritis, obesity, anxiety, constipation, sarcoidosis disease process and self health management plan as evidenced by patient report. take all medications exactly as prescribed and will call provider for medication related questions as evidenced by patient report demonstrate understanding of rationale for each prescribed medication as evidenced by patient report attend all scheduled medical appointments as evidenced by patient report. demonstrate ongoing  adherence to prescribed treatment plan for HTN, DM, osteoarthritis, obesity, anxiety, constipation, sarcoidosis   as evidenced by patient report. continue to work with RN Care Manager to address care management and care coordination needs related to HTN, DM, osteoarthritis, obesity, anxiety, constipation, sarcoidosis as evidenced by adherence to CM Team Scheduled appointments work with pharmacist to address medication needs related to  HTN, DM, osteoarthritis, obesity, anxiety, constipation, sarcoidosis  as evidenced by review or EMR and patient or pharmacist report work with Child psychotherapist to address resources needed related to the management of financial constraints  related to the management of HTN, DM, osteoarthritis, obesity, anxiety, constipation, sarcoidosis as evidenced by review of EMR and patient or Child psychotherapist report through collaboration with Medical illustrator, provider, and care team.   Interventions: Evaluation of current treatment plan related to  self management and patient's adherence to plan as established by provider  Diabetes Interventions:  (Status:  New goal.) Long Term Goal Assessed patient's  understanding of A1c goal: <7% Reviewed medications with patient and discussed importance of medication adherence Counseled on importance of regular laboratory monitoring as prescribed Discussed plans with patient for ongoing care management follow up and provided patient with direct contact information for care management team Reviewed scheduled/upcoming provider appointments Advised patient, providing education and rationale, to check cbg as directed  and record, calling provider  for findings outside established parameters Referral made to pharmacy team for assistance with medications  Referral made to social work team for assistance with resources  Review of patient status, including review of consultants reports, relevant laboratory and other test results, and medications completed Assessed social determinant of health barriers Lab Results  Component Value Date   HGBA1C 9.4 05/24/23   Hypertension Interventions:  (Status:  New goal.) Long Term Goal Last practice recorded BP readings:  BP Readings from Last 3 Encounters:  05/31/23      134/70  04/16/23           114/72  06/28/23      111/74   Most recent eGFR/CrCl:  Lab Results  Component Value Date   EGFR 47 (L) 10/16/2022    No components found for: "CRCL"  Evaluation of current treatment plan related to hypertension self management and patient's adherence to plan as established by provider Reviewed medications with patient and discussed importance of compliance Discussed plans with patient for  ongoing care management follow up and provided patient with direct contact information for care management team Advised patient, providing education and rationale, to monitor blood pressure daily and record, calling PCP for findings outside established parameters Reviewed scheduled/upcoming provider appointments including:  Assessed social determinant of health barriers  SDOH Barriers (Status:  New goal.) Long Term Goal Patient  interviewed and SDOH assessment performed        SDOH Interventions    Flowsheet Row Patient Outreach Telephone from 02/12/2023 in [place] POPULATION HEALTH DEPARTMENT Office Visit from 07/29/2022 in [facility]  SDOH Interventions    Food Insecurity Interventions -- Other (Comment)  Utilities Interventions Other (Comment)  [patient has set up a payment plan] --  Financial Strain Interventions -- Other (Comment)  Health Literacy Interventions Intervention Not Indicated --      Patient interviewed and appropriate assessments performed Discussed plans with patient for ongoing care management follow up and provided patient with direct contact information for care management team Advised patient to f/u with provider regarding cane-completed 04/01/23:  Patient has received resources from BSW-f/u scheduled. 04/30/23:  Patient needs eye resources-BSW appt rescheduled.  05/31/23: Patient to f/u on ENDO referral  Patient Goals/Self-Care Activities: Notify RN Care Manager of [facility] call rescheduling needs Take all medications as prescribed Attend all scheduled provider appointments Call pharmacy for medication refills 3-7 days in advance of running out of medications Perform all self care activities independently  Perform IADL's (shopping, preparing meals, housekeeping, managing finances) independently Call provider office for new concerns or questions  Work with the social worker to address care coordination needs and will continue to work with the clinical team to address health care and disease management related needs 04/01/23:  patient to f/u with legal aid if wishes. 06/27/33: patient to call Pharmacist back regarding CBG-given phone number.  Follow Up Plan:  The patient has been provided with contact information for the care management team and has been advised to call with any health related questions or concerns.  The care management team will reach out to the patient  again over the next 30 business  days.

## 2023-06-28 NOTE — Patient Outreach (Signed)
 Medicaid Managed Care   Nurse Care Manager Note  06/28/2023 Name:  Wanda Weaver MRN:  [id number] DOB:  01/27/1961  Wanda Weaver is an 63 y.o. year old female who is a primary patient of [name], NP.  The [facility] Managed Care Coordination team was consulted for assistance with:    Chronic healthcare management needs, HTN, DM, sarcoidosis, osteoarthritis, obesity  [name] was given information about Medicaid Managed Care Coordination team services today. [name] Patient agreed to services and verbal consent obtained.  Engaged with patient by telephone for follow up visit in response to provider referral for case management and/or care coordination services.   Patient is participating in a Managed Medicaid Plan:  Yes  Assessments/Interventions:  Review of past medical history, allergies, medications, health status, including review of consultants reports, laboratory and other test data, was performed as part of comprehensive evaluation and provision of chronic care management services.  SDOH (Social Drivers of Health) assessments and interventions performed: SDOH Interventions    Flowsheet Row Patient Outreach Telephone from 06/28/2023 in [place] POPULATION HEALTH DEPARTMENT Patient Outreach Telephone from 05/31/2023 in [place] POPULATION HEALTH DEPARTMENT Office Visit from 05/24/2023 in [facility] Of Eligha Bridegroom [facility] Patient Outreach Telephone from 04/30/2023 in [place] POPULATION HEALTH DEPARTMENT Patient Outreach Telephone from 04/01/2023 in [place] POPULATION HEALTH DEPARTMENT Patient Outreach Telephone from 02/12/2023 in [place] POPULATION HEALTH DEPARTMENT  SDOH Interventions        Food Insecurity Interventions Intervention Not Indicated -- -- -- -- --  Utilities Interventions -- -- -- -- -- Other (Comment)  [patient has set up a payment plan]  Alcohol Usage Interventions -- -- -- -- Intervention Not Indicated (Score <7) --   Depression Interventions/Treatment  -- -- [id number] Score <4 Follow-up Not Indicated -- -- --  Financial Strain Interventions -- Other (Comment)  [sent utility resources] -- -- -- --  Physical Activity Interventions -- -- -- -- Intervention Not Indicated, Other (Comments)  [physically not able to engage in this type of exercise] --  Stress Interventions -- -- -- Intervention Not Indicated -- --  Health Literacy Interventions Intervention Not Indicated -- -- -- -- Intervention Not Indicated     Care Plan Allergies  Allergen Reactions   Ibuprofen Palpitations    Medications Reviewed Today     Reviewed by Danie Chandler, RN (Registered Nurse) on 06/28/23 at 1522  Med List Status: <None>   Medication Order Taking? Sig Documenting Provider Last Dose Status Informant  Accu-Chek Softclix Lancets lancets [id number] No Use as instructed [name], NP Taking Active            Med Note [place], [facility] R   Thu Jun 10, 2023 11:15 AM) Supply  albuterol (VENTOLIN HFA) 108 (90 Base) MCG/ACT inhaler [id number] No Inhale 2 puffs into the lungs every 6 (six) hours as needed. [name], NP Taking Active   blood glucose meter kit and supplies KIT [id number] No Use up to four times daily as directed. [name], NP Taking Active            Med Note Para March, [facility] R   Thu Jun 10, 2023 11:15 AM) Supply  Elastic Bandages & Supports (KNEE BRACE ADJUSTABLE HINGED) MISC [id number] No 1 application by Does not apply route daily. Barbette Merino, NP Taking Active            Med Note [place], [facility] R  Thu Jun 10, 2023 11:19 AM) Supply  glucose blood test strip [id number] No Use as instructed [name], NP Taking Active            Med Note Para March, [facility] R   Thu Jun 10, 2023 11:19 AM) supply  hydrochlorothiazide (HYDRODIURIL) 25 MG tablet [id number] No Take 1 tablet (25 mg total) by mouth daily. [name], NP Taking Active   insulin glargine (LANTUS) 100 UNIT/ML injection [id number] No  Inject 0.34 mLs (34 Units total) into the skin daily. [name], NP Taking Active   Insulin Syringe-Needle U-100 (INSULIN SYRINGE .5CC/31GX5/16") 31G X 5/16" 0.5 ML MISC [id number] No Use to inject insulin daily [name], NP Taking Active            Med Note Para March, [facility] R   Thu Jun 10, 2023 11:21 AM) Supply  losartan (COZAAR) 25 MG tablet [id number] No Take 1 tablet (25 mg total) by mouth daily. [name], NP Taking Active   metFORMIN (GLUCOPHAGE) 500 MG tablet [id number] No Take 2 tablets (1,000 mg total) by mouth 2 (two) times daily with a meal. [name], NP Taking Active   metoprolol succinate (TOPROL-XL) 50 MG 24 hr tablet [id number] No Take 1 tablet (50 mg total) by mouth daily. Take with or immediately following a meal. [name], NP Taking Active   ondansetron (ZOFRAN) 4 MG tablet [id number] No Take 1 tablet (4 mg total) by mouth every 8 (eight) hours as needed for nausea or vomiting. Donell Beers, FNP Taking Active            Med Note Para [place], [facility] R   Thu Jun 10, 2023 11:20 AM) Taking PRN  rosuvastatin (CRESTOR) 40 MG tablet [id number] No Take 1 tablet (40 mg total) by mouth daily. [name], NP Taking Active            Patient Active Problem List   Diagnosis Date Noted   Paresthesia 10/22/2022   Gait abnormality 10/22/2022   Near syncope 09/14/2022   Adhesive capsulitis of left shoulder 07/24/2022   Constipation 05/25/2022   Osteoarthritis of right shoulder 04/22/2022   Bilateral lower extremity pain 08/20/2019   Bilateral lower extremity edema 08/20/2019   Hemoglobin A1C greater than 9%, indicating poor diabetic control 02/21/2019   Weight loss 02/21/2019   Muscle strain of wrist, left, initial encounter 09/22/2018   Hyperglycemia 08/15/2018   Essential hypertension 04/25/2018   Class 1 obesity due to excess calories with serious comorbidity in adult 04/25/2018   Right foot pain 04/25/2018   Anxiety 04/25/2018   Type 2  diabetes mellitus with complication, with long-term current use of insulin (HCC) 08/06/2017   Conditions to be addressed/monitored per PCP order:  Chronic healthcare management needs, HTN, DM, sarcoidosis, osteoarthritis, obesity  Care Plan : General Plan of Care (Adult)  Updates made by Danie Chandler, RN since 06/28/2023 12:00 AM     Problem: Health Promotion or Disease Self-Management (General Plan of Care)      Long-Range Goal: Chronic Disease Management and Care Coordination Needs   Start Date: 02/12/2023  Expected End Date: 09/28/2023  Priority: High  Note:   Current Barriers:  Knowledge Deficits related to plan of care for management of HTN, DM, osteoarthritis, obesity, anxiety, constipation, sarcoidosis Care Coordination needs related to HTN, DM, osteoarthritis, obesity, anxiety, constipation, sarcoidosis Chronic Disease Management support and education needs related to HTN, DM, osteoarthritis, obesity, anxiety, constipation, sardoiosis Corporate treasurer  Difficulty obtaining medications 06/28/23: patient to have PET scan 4/9.  BP stable.  Blood glucose elevated with increased pain-to f/u regarding ENDO referral.  Has NEURO appt 3/20.  RNCM Clinical Goal(s):  verbalize understanding of plan for management of HTN, DM, osteoarthritis, obesity, anxiety, constipation, sarcoidosis as evidenced by patient report verbalize basic understanding of  HTN, DM, osteoarthritis, obesity, anxiety, constipation, sarcoidosis disease process and self health management plan as evidenced by patient report. take all medications exactly as prescribed and will call provider for medication related questions as evidenced by patient report demonstrate understanding of rationale for each prescribed medication as evidenced by patient report attend all scheduled medical appointments as evidenced by patient report. demonstrate ongoing  adherence to prescribed treatment plan for HTN, DM, osteoarthritis,  obesity, anxiety, constipation, sarcoidosis   as evidenced by patient report. continue to work with RN Care Manager to address care management and care coordination needs related to HTN, DM, osteoarthritis, obesity, anxiety, constipation, sarcoidosis as evidenced by adherence to CM Team Scheduled appointments work with pharmacist to address medication needs related to  HTN, DM, osteoarthritis, obesity, anxiety, constipation, sarcoidosis  as evidenced by review or EMR and patient or pharmacist report work with Child psychotherapist to address resources needed related to the management of financial constraints  related to the management of HTN, DM, osteoarthritis, obesity, anxiety, constipation, sarcoidosis as evidenced by review of EMR and patient or Child psychotherapist report through collaboration with Medical illustrator, provider, and care team.   Interventions: Evaluation of current treatment plan related to  self management and patient's adherence to plan as established by provider  Diabetes Interventions:  (Status:  New goal.) Long Term Goal Assessed patient's understanding of A1c goal: <7% Reviewed medications with patient and discussed importance of medication adherence Counseled on importance of regular laboratory monitoring as prescribed Discussed plans with patient for ongoing care management follow up and provided patient with direct contact information for care management team Reviewed scheduled/upcoming provider appointments Advised patient, providing education and rationale, to check cbg as directed  and record, calling provider  for findings outside established parameters Referral made to pharmacy team for assistance with medications  Referral made to social work team for assistance with resources  Review of patient status, including review of consultants reports, relevant laboratory and other test results, and medications completed Assessed social determinant of health barriers Lab Results   Component Value Date   HGBA1C 9.4 05/24/23   Hypertension Interventions:  (Status:  New goal.) Long Term Goal Last practice recorded BP readings:  BP Readings from Last 3 Encounters:  05/31/23      134/70  04/16/23           114/72  06/28/23      111/74   Most recent eGFR/CrCl:  Lab Results  Component Value Date   EGFR 47 (L) 10/16/2022    No components found for: "CRCL"  Evaluation of current treatment plan related to hypertension self management and patient's adherence to plan as established by provider Reviewed medications with patient and discussed importance of compliance Discussed plans with patient for ongoing care management follow up and provided patient with direct contact information for care management team Advised patient, providing education and rationale, to monitor blood pressure daily and record, calling PCP for findings outside established parameters Reviewed scheduled/upcoming provider appointments including:  Assessed social determinant of health barriers  SDOH Barriers (Status:  New goal.) Long Term Goal Patient interviewed and SDOH assessment performed  SDOH Interventions    Flowsheet Row Patient Outreach Telephone from 02/12/2023 in [place] POPULATION HEALTH DEPARTMENT Office Visit from 07/29/2022 in [facility]  SDOH Interventions    Food Insecurity Interventions -- Other (Comment)  Utilities Interventions Other (Comment)  [patient has set up a payment plan] --  Financial Strain Interventions -- Other (Comment)  Health Literacy Interventions Intervention Not Indicated --      Patient interviewed and appropriate assessments performed Discussed plans with patient for ongoing care management follow up and provided patient with direct contact information for care management team Advised patient to f/u with provider regarding cane-completed 04/01/23:  Patient has received resources from BSW-f/u scheduled. 04/30/23:  Patient  needs eye resources-BSW appt rescheduled.  05/31/23: Patient to f/u on ENDO referral  Patient Goals/Self-Care Activities: Notify RN Care Manager of [facility] call rescheduling needs Take all medications as prescribed Attend all scheduled provider appointments Call pharmacy for medication refills 3-7 days in advance of running out of medications Perform all self care activities independently  Perform IADL's (shopping, preparing meals, housekeeping, managing finances) independently Call provider office for new concerns or questions  Work with the social worker to address care coordination needs and will continue to work with the clinical team to address health care and disease management related needs 04/01/23:  patient to f/u with legal aid if wishes. 06/27/33: patient to call Pharmacist back regarding CBG-given phone number.  Follow Up Plan:  The patient has been provided with contact information for the care management team and has been advised to call with any health related questions or concerns.  The care management team will reach out to the patient again over the next 30 business  days.     Follow Up:  Patient agrees to Care Plan and Follow-up.  Plan: The Managed Medicaid care management team will reach out to the patient again over the next 30 business  days. and The  Patient has been provided with contact information for the Managed Medicaid care management team and has been advised to call with any health related questions or concerns.  Date/time of next scheduled RN care management/care coordination outreach:  07/29/23 at 130.

## 2023-07-04 ENCOUNTER — Other Ambulatory Visit: Payer: Self-pay | Admitting: Nurse Practitioner

## 2023-07-04 DIAGNOSIS — E118 Type 2 diabetes mellitus with unspecified complications: Secondary | ICD-10-CM

## 2023-07-05 ENCOUNTER — Other Ambulatory Visit: Payer: Self-pay

## 2023-07-05 MED ORDER — INSULIN GLARGINE 100 UNIT/ML ~~LOC~~ SOLN
34.0000 [IU] | Freq: Every day | SUBCUTANEOUS | 11 refills | Status: DC
  Filled 2023-07-05: qty 10, 28d supply, fill #0
  Filled 2023-08-04: qty 10, 28d supply, fill #1
  Filled 2023-08-31: qty 10, 28d supply, fill #2
  Filled 2023-09-27: qty 10, 28d supply, fill #3
  Filled 2023-10-17: qty 10, 28d supply, fill #4
  Filled 2023-11-08 – 2023-11-12 (×3): qty 10, 28d supply, fill #5
  Filled 2023-12-08: qty 10, 28d supply, fill #6
  Filled 2023-12-30: qty 10, 28d supply, fill #7
  Filled 2024-01-20 – 2024-01-26 (×3): qty 10, 28d supply, fill #8
  Filled 2024-02-20 – 2024-02-23 (×2): qty 10, 28d supply, fill #9
  Filled 2024-03-11 – 2024-03-22 (×4): qty 10, 28d supply, fill #10
  Filled 2024-04-09 – 2024-04-19 (×6): qty 10, 28d supply, fill #11

## 2023-07-13 ENCOUNTER — Other Ambulatory Visit: Payer: Self-pay

## 2023-07-19 ENCOUNTER — Telehealth (HOSPITAL_COMMUNITY): Payer: Self-pay | Admitting: *Deleted

## 2023-07-19 NOTE — Telephone Encounter (Signed)
 Attempted to call patient regarding upcoming cardiac PET appointment. Left message on voicemail with name and callback number  Larey Brick RN Navigator Cardiac Imaging [facility] [phone] Office [phone] Cell

## 2023-07-20 ENCOUNTER — Telehealth (HOSPITAL_COMMUNITY): Payer: Self-pay | Admitting: *Deleted

## 2023-07-20 ENCOUNTER — Telehealth (HOSPITAL_BASED_OUTPATIENT_CLINIC_OR_DEPARTMENT_OTHER): Payer: Self-pay | Admitting: Pulmonary Disease

## 2023-07-20 NOTE — Telephone Encounter (Signed)
 Copied from CRM [id number]. Topic: Referral - Prior Authorization Question >> Jul 20, 2023 10:25 AM Gaetano Hawthorne wrote: Reason for CRM: Patient has a PET/CT scheduled for tomorrow - the authorization expries today. [facility] pre-cert departments needs a new authorization # - PET/CT scan ordered by Dr. Everardo All - [place] PET CT MYOCARDIAL SARCOIDOSIS (Order [id number]) in the Chart.  Authorization can be entered and faxed to [phone]

## 2023-07-20 NOTE — Telephone Encounter (Signed)
 Reaching out to patient to offer assistance regarding upcoming cardiac imaging study; pt verbalizes understanding of appt date/time, parking situation and where to check in, pre-test NPO status; name and call back number provided for further questions should they arise  Larey Brick RN Navigator Cardiac Imaging [facility] [phone] office [phone] cell  Patient verbalized understanding of diet prep.

## 2023-07-21 ENCOUNTER — Ambulatory Visit (HOSPITAL_COMMUNITY)
Admission: RE | Admit: 2023-07-21 | Discharge: 2023-07-21 | Disposition: A | Discharge status: Home / Self Care | Payer: Medicaid Other | Source: Ambulatory Visit | Attending: Pulmonary Disease | Admitting: Pulmonary Disease

## 2023-07-21 DIAGNOSIS — D869 Sarcoidosis, unspecified: Secondary | ICD-10-CM | POA: Diagnosis present

## 2023-07-21 LAB — NM PET CT MYOCARDIAL SARCOIDOSIS
Nuc Stress EF: 67 %
Rest Nuclear Isotope Dose: 24.8 mCi

## 2023-07-21 MED ORDER — RUBIDIUM RB82 GENERATOR (RUBYFILL)
24.7700 | PACK | Freq: Once | INTRAVENOUS | Status: AC
  Administered 2023-07-21: 24.77 via INTRAVENOUS

## 2023-07-21 MED ORDER — FLUDEOXYGLUCOSE F - 18 (FDG) INJECTION
8.9600 | Freq: Once | INTRAVENOUS | Status: AC
  Administered 2023-07-21: 8.96 via INTRAVENOUS

## 2023-07-26 ENCOUNTER — Other Ambulatory Visit: Payer: Self-pay

## 2023-07-29 ENCOUNTER — Ambulatory Visit: Payer: Self-pay

## 2023-07-29 NOTE — Patient Instructions (Signed)
 Visit Information  Wanda Weaver was given information about Medicaid Managed Care team care coordination services as a part of their [facility] Community Plan Medicaid benefit. [name] verbally consented to engagement with the [facility] Managed Care team.   If you are experiencing a medical emergency, please call 911 or report to your local emergency department or urgent care.   If you have a non-emergency medical problem during routine business hours, please contact your provider's office and ask to speak with a nurse.   For questions related to your [facility] please call: [phone] or visit the homepage here: [email or web address]  If you would like to schedule transportation through your [facility], please call the following number at least 2 days in advance of your appointment: [phone]   Rides for urgent appointments can also be made after hours by calling Member Services.  Call the Behavioral Health Crisis Line at [phone], at any time, 24 hours a day, 7 days a week. If you are in danger or need immediate medical attention call 911.  If you would like help to quit smoking, call 1-800-QUIT-NOW ((971)640-7437) OR Espaol: 1-855-Djelo-Ya (1-324-401-0272) o para ms informacin haga clic aqu or Text READY to 536-644 to register via text  Ms. Kammerer - following are the goals we discussed in your visit today:   Goals Addressed             This Visit's Progress    VBCI RN Care Plan       Problems:  Chronic Disease Management support and education needs related to Sarcoidosis  Goal: Over the next 90 days the Patient will attend all scheduled medical appointments: with PCP and specialist as evidenced by keeping al scheduled appointments    demonstrate Ongoing adherence to prescribed treatment plan for Sarcoidosis as evidenced by no admissions to the hospital verbalize  basic understanding of Sarcoidosis disease process and self health management plan as evidenced by verbal explanation lifestyle changes and consistent medication compliance   Interventions:   Evaluation of current treatment plan related to Sarcoidosis,  self-management and patient's adherence to plan as established by provider. Discussed plans with patient for ongoing care management follow up and provided patient with direct contact information for care management team Evaluation of current treatment plan related to Sarcoidosis and patient's adherence to plan as established by provider Provided education to patient and/or caregiver about Sarcoidosis ( sent information thru Mychart) Reviewed medications with patient and discussed  Reviewed scheduled/upcoming provider appointments Pulmonology 08/04/23 Radiology 08/13/23 and 08/22/23 PCP 08/23/23  Opthamology 08/27/23    Discussed plans with patient for ongoing care management follow up and provided patient with direct contact information for care management team  Patient Self-Care Activities:  Attend all scheduled provider appointments Call pharmacy for medication refills 3-7 days in advance of running out of medications Call provider office for new concerns or questions  Perform all self care activities independently  Take medications as prescribed    Plan:  Telephone follow up appointment with care management team member scheduled for:  09/01/23  10 am             Please see education materials related to Sarcoidosis provided by MyChart link.  Patient verbalizes understanding of instructions and care plan provided today and agrees to view in MyChart. Active MyChart status and patient understanding of how to access instructions and care plan via MyChart confirmed with patient.     Follow Up Plan:  09/01/23  10 am   Augustin Leber RN, BSN, [facility]  [facility], [facility]  Care Coordinator Phone:  [phone]      Following is a copy of your plan of care:  There are no care plans that you recently modified to display for this patient.

## 2023-07-29 NOTE — Patient Outreach (Signed)
 Complex Care Management   Visit Note  07/29/2023  Name:  Wanda Weaver MRN: [id number] DOB: November 15, 1960  Situation: Referral received for Complex Care Management related to  Sarcoidosis  I obtained verbal consent from Patient.  Visit completed with patient  on the phone  Background:   Past Medical History:  Diagnosis Date   Bilateral lower extremity edema 07/2019   Bilateral lower extremity pain 07/2019   Diabetes mellitus without complication (HCC)    Hemoglobin A1C greater than 9%, indicating poor diabetic control    Hyperlipidemia    Hypertension    Vitamin D deficiency     Assessment: Patient Reported Symptoms:  Cognitive Cognitive Status: Able to follow simple commands, Alert and oriented to person, place, and time, Insightful and able to interpret abstract concepts      Neurological   Neurological Management Strategies: Adequate rest, Medication therapy Neurological Self-Management Outcome: 4 (good)  HEENT HEENT Symptoms Reported: No symptoms reported      Cardiovascular Cardiovascular Symptoms Reported: No symptoms reported Cardiovascular Self-Management Outcome: 3 (uncertain)  Respiratory Respiratory Symptoms Reported: Shortness of breath Respiratory Conditions: Shortness of breath Respiratory Self-Management Outcome: 3 (uncertain)  Endocrine Is patient diabetic?: Yes Is patient checking blood sugars at home?: Yes Endocrine Conditions: Diabetes Endocrine Management Strategies: Medication therapy  Gastrointestinal Gastrointestinal Symptoms Reported: No symptoms reported      Genitourinary      Integumentary Integumentary Symptoms Reported: Bruising Skin Conditions:  (From placing an IV) Skin Self-Management Outcome: 3 (uncertain)  Musculoskeletal Musculoskelatal Symptoms Reviewed: No symptoms reported   Falls in the past year?: No    Psychosocial Psychosocial Symptoms Reported: No symptoms reported            07/29/2023    2:13 PM  Depression screen PHQ  2/9  Decreased Interest 1  Down, Depressed, Hopeless 0  PHQ - 2 Score 1    There were no vitals filed for this visit.  Medications Reviewed Today     Reviewed by Augustin Leber, RN (Registered Nurse) on 07/29/23 at 1351  Med List Status: <None>   Medication Order Taking? Sig Documenting Provider Last Dose Status Informant  Accu-Chek Softclix Lancets lancets [id number] Yes Use as instructed Jerrlyn Morel, NP Taking Active            Med Note Vallarie Gauze, [facility] R   Thu Jun 10, 2023 11:15 AM) Supply  albuterol (VENTOLIN HFA) 108 (90 Base) MCG/ACT inhaler [id number] Yes Inhale 2 puffs into the lungs every 6 (six) hours as needed. Jerrlyn Morel, NP Taking Active   blood glucose meter kit and supplies KIT [id number] Yes Use up to four times daily as directed. Jerrlyn Morel, NP Taking Active            Med Note Vallarie Gauze, [facility] R   Thu Jun 10, 2023 11:15 AM) Supply  Elastic Bandages & Supports (KNEE BRACE ADJUSTABLE HINGED) MISC [id number] Yes 1 application by Does not apply route daily. Gregoria Leas, NP Taking Active            Med Note Alexandria Angel R   Thu Jun 10, 2023 11:19 AM) Supply  glucose blood test strip [id number] Yes Use as instructed Jerrlyn Morel, NP Taking Active            Med Note Vallarie Gauze, [facility] R   Thu Jun 10, 2023 11:19 AM) supply  hydrochlorothiazide (HYDRODIURIL) 25 MG tablet [id number] Yes Take 1 tablet (25 mg total) by mouth daily. Jerrlyn Morel, NP  Taking Active   insulin glargine (LANTUS) 100 UNIT/ML injection [id number] Yes Inject 0.34 mLs (34 Units total) into the skin daily. Jerrlyn Morel, NP Taking Active   Insulin Syringe-Needle U-100 (INSULIN SYRINGE .5CC/31GX5/16") 31G X 5/16" 0.5 ML MISC [id number] Yes Use to inject insulin daily [name], [name], NP Taking Active            Med Note Vallarie Gauze, [facility] R   Thu Jun 10, 2023 11:21 AM) Supply  losartan (COZAAR) 25 MG tablet [id number] Yes Take 1 tablet (25 mg total) by mouth daily. Jerrlyn Morel, NP  Taking Active   metFORMIN (GLUCOPHAGE) 500 MG tablet [id number] Yes Take 2 tablets (1,000 mg total) by mouth 2 (two) times daily with a meal. Jerrlyn Morel, NP Taking Active   metoprolol succinate (TOPROL-XL) 50 MG 24 hr tablet [id number] Yes Take 1 tablet (50 mg total) by mouth daily. Take with or immediately following a meal. Jerrlyn Morel, NP Taking Active   ondansetron (ZOFRAN) 4 MG tablet [id number] Yes Take 1 tablet (4 mg total) by mouth every 8 (eight) hours as needed for nausea or vomiting. [name], [name], FNP Taking Active            Med Note Vallarie Gauze, [facility] R   Thu Jun 10, 2023 11:20 AM) Taking PRN  rosuvastatin (CRESTOR) 40 MG tablet [id number] Yes Take 1 tablet (40 mg total) by mouth daily. Jerrlyn Morel, NP Taking Active             Recommendation:   Go to follow up appointments  Pulmonology 08/04/23 Radiology 08/13/23 and 08/22/23 PCP 08/23/23  Opthamology 08/27/23     Follow Up Plan:   Telephone follow-up in 1 month  Augustin Leber RN, BSN, [facility]  [facility], [facility]  Care Coordinator Phone: [phone]

## 2023-08-04 ENCOUNTER — Other Ambulatory Visit (HOSPITAL_BASED_OUTPATIENT_CLINIC_OR_DEPARTMENT_OTHER): Payer: Self-pay

## 2023-08-04 ENCOUNTER — Ambulatory Visit (HOSPITAL_BASED_OUTPATIENT_CLINIC_OR_DEPARTMENT_OTHER): Admitting: Pulmonary Disease

## 2023-08-04 ENCOUNTER — Other Ambulatory Visit: Payer: Self-pay | Admitting: Nurse Practitioner

## 2023-08-04 ENCOUNTER — Encounter (HOSPITAL_BASED_OUTPATIENT_CLINIC_OR_DEPARTMENT_OTHER): Payer: Self-pay | Admitting: Pulmonary Disease

## 2023-08-04 VITALS — BP 128/84 | HR 78 | Ht 62.0 in | Wt 212.5 lb

## 2023-08-04 DIAGNOSIS — E1140 Type 2 diabetes mellitus with diabetic neuropathy, unspecified: Secondary | ICD-10-CM

## 2023-08-04 DIAGNOSIS — I10 Essential (primary) hypertension: Secondary | ICD-10-CM

## 2023-08-04 DIAGNOSIS — G629 Polyneuropathy, unspecified: Secondary | ICD-10-CM

## 2023-08-04 DIAGNOSIS — J42 Unspecified chronic bronchitis: Secondary | ICD-10-CM | POA: Diagnosis not present

## 2023-08-04 DIAGNOSIS — D860 Sarcoidosis of lung: Secondary | ICD-10-CM

## 2023-08-04 DIAGNOSIS — R590 Localized enlarged lymph nodes: Secondary | ICD-10-CM | POA: Diagnosis not present

## 2023-08-04 DIAGNOSIS — Z9225 Personal history of immunosupression therapy: Secondary | ICD-10-CM

## 2023-08-04 DIAGNOSIS — D869 Sarcoidosis, unspecified: Secondary | ICD-10-CM

## 2023-08-04 MED ORDER — OPTICHAMBER DIAMOND-LG MASK DEVI
1 refills | Status: DC
  Filled 2023-08-04: qty 1, 30d supply, fill #0
  Filled 2023-08-23: qty 1, 30d supply, fill #1

## 2023-08-04 MED ORDER — METOPROLOL SUCCINATE ER 50 MG PO TB24
50.0000 mg | ORAL_TABLET | Freq: Every day | ORAL | 3 refills | Status: AC
  Filled 2023-08-04: qty 90, 90d supply, fill #0
  Filled 2023-10-24: qty 90, 90d supply, fill #1
  Filled 2024-02-04: qty 90, 90d supply, fill #2
  Filled 2024-05-04: qty 90, 90d supply, fill #3

## 2023-08-04 MED ORDER — FLUTICASONE-SALMETEROL 115-21 MCG/ACT IN AERO
2.0000 | INHALATION_SPRAY | Freq: Two times a day (BID) | RESPIRATORY_TRACT | 5 refills | Status: DC
  Filled 2023-08-04: qty 12, 30d supply, fill #0
  Filled 2023-08-23: qty 12, 30d supply, fill #1

## 2023-08-04 NOTE — Progress Notes (Unsigned)
 Subjective:   PATIENT ID: Wanda Weaver GENDER: female DOB: October 20, 1960, MRN: [id number]  Chief Complaint  Patient presents with   Follow-up    Sarcoidosis    Reason for Visit: Follow-up sarcoidosis  [name] is a 63 year old female never smoker with sarcoidosis, HTN, DM2, HLD who presents for sarcoidosis management.  Initial consult She had abnormal CT CAP with numerous nodules including chest lymphadenopathy. She underwent biopsy left supraclavicular on 01/19/23 with results lymph node with abundant granulomatous inflammation. No evidence of lymphoma identified.  She reports shortness of breath with activity for over a years that has gradually worsened. Sometimes cough and wheezing. She uses albuterol  up to 3 times a week usually with exertion. Denies chest pain. She has limited activity due to nerve pain that initially began two years and a year ago her left leg started having nerve issues. Unchanged weight in the 200 lbs range. DM2 has not been controlled with last A1c 8.3. She has cloudy vision and planning to see her eye doctor.  08/04/23 Since our last visit she reports unchanged shortness of breath with occasional wheezing and cough. Worsens with activity and sometimes associated with chest pain that resolves when stopping the activity including walking. Able to walk to her mailbox which is about 50 ft but difficulty walking back to her home due uphill. She has been seen by [facility] Neurology for sarcoid. Note reviewed***Referred to endocrine, rheumatology and neuroopthalmology.  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  Past Medical History:  Diagnosis Date   Bilateral lower extremity edema 07/2019   Bilateral lower extremity pain 07/2019   Diabetes mellitus without complication (HCC)    Hemoglobin A1C greater than 9%, indicating poor diabetic control    Hyperlipidemia    Hypertension     Vitamin D  deficiency      Family History  Problem Relation Age of Onset   Diabetes Mother    Dementia Mother    Diabetes Father    Hypertension Father      Social History   Occupational History   Not on file  Tobacco Use   Smoking status: Never   Smokeless tobacco: Never  Vaping Use   Vaping status: Never Used  Substance and Sexual Activity   Alcohol use: No   Drug use: No   Sexual activity: Yes    Partners: Male    Allergies  Allergen Reactions   Ibuprofen  Palpitations     Outpatient Medications Prior to Visit  Medication Sig Dispense Refill   Accu-Chek Softclix Lancets lancets Use as instructed 100 each 12   albuterol  (VENTOLIN  HFA) 108 (90 Base) MCG/ACT inhaler Inhale 2 puffs into the lungs every 6 (six) hours as needed. 18 g 2   blood glucose meter kit and supplies KIT Use up to four times daily as directed. 1 each 0   Elastic Bandages & Supports (KNEE BRACE ADJUSTABLE HINGED) MISC 1 application by Does not apply route daily. 1 each 0   glucose blood test strip Use as instructed 100 each 12   hydrochlorothiazide  (HYDRODIURIL ) 25 MG tablet Take 1 tablet (25 mg total) by mouth daily. 90 tablet 1   insulin  glargine (LANTUS ) 100 UNIT/ML injection Inject 0.34 mLs (34 Units total) into the skin daily. 10 mL 11   Insulin  Syringe-Needle U-100 (INSULIN  SYRINGE .5CC/31GX5/16") 31G X 5/16" 0.5 ML MISC Use to inject insulin  daily 100 each 2   losartan  (  COZAAR ) 25 MG tablet Take 1 tablet (25 mg total) by mouth daily. 90 tablet 1   metFORMIN  (GLUCOPHAGE ) 500 MG tablet Take 2 tablets (1,000 mg total) by mouth 2 (two) times daily with a meal. 360 tablet 3   metoprolol  succinate (TOPROL -XL) 50 MG 24 hr tablet Take 1 tablet (50 mg total) by mouth daily. Take with or immediately following a meal. 90 tablet 3   ondansetron  (ZOFRAN ) 4 MG tablet Take 1 tablet (4 mg total) by mouth every 8 (eight) hours as needed for nausea or vomiting. 20 tablet 0   rosuvastatin  (CRESTOR ) 40 MG tablet  Take 1 tablet (40 mg total) by mouth daily. 90 tablet 1   No facility-administered medications prior to visit.    Review of Systems  Constitutional:  Negative for chills, diaphoresis, fever, malaise/fatigue and weight loss.  HENT:  Negative for congestion.   Respiratory:  Positive for cough, shortness of breath and wheezing. Negative for hemoptysis and sputum production.   Cardiovascular:  Negative for chest pain, palpitations and leg swelling.  Neurological:  Positive for sensory change.     Objective:   Vitals:   08/04/23 1021  BP: 128/84  Pulse: 78  SpO2: 100%  Weight: 212 lb 8 oz (96.4 kg)  Height: 5\' 2"  (1.575 m)    SpO2: 100 %  Physical Exam: General: Well-appearing, no acute distress HENT: [place], AT Eyes: EOMI, no scleral icterus Respiratory: Clear to auscultation bilaterally.  No crackles, wheezing or rales Cardiovascular: RRR, -M/R/G, no JVD Extremities:-Edema,-tenderness Neuro: AAO x4, CNII-XII grossly intact Psych: Normal mood, normal affect  Data Reviewed:  Imaging: CT Chest abdomen pelvis 12/07/22 - Numerous enlarged lower left cervical and supraclavicular nodules measuring up to 1.7 x 1.4 cm. Numerous enlarged mediastinal and bilateral hilar lymph nodes, largest right paratracheal node 2.4 x 1.9 cm. Numerous small bilateral pulmonary nodules including 0.5 cm LUL and0.4 cm nodule RUL. Includes gastrohepatic ligament, porta hepatis, portacaval nodules in upper abdomen  PET/CT Myocardial Sarcoid 07/21/23 - No evidence of cardiac sarcoid. Unchanged mediastinal and hlar lymph nodules  PFT: None on file  Labs:    Latest Ref Rng & Units 01/19/2023    6:58 AM 12/04/2022   12:24 PM 10/16/2022    3:44 PM  CBC  WBC 4.0 - 10.5 K/uL 4.9  4.7  5.1   Hemoglobin 12.0 - 15.0 g/dL 78.2  95.6  21.3   Hematocrit 36.0 - 46.0 % 34.4  32.5  33.6   Platelets 150 - 400 K/uL 201  197  221       Latest Ref Rng & Units 01/19/2023    6:58 AM 12/04/2022   12:24 PM 10/16/2022    3:44  PM  CMP  Glucose 70 - 99 mg/dL 086  578  469   BUN 8 - 23 mg/dL 22  24  21    Creatinine 0.44 - 1.00 mg/dL 6.29  5.28  4.13   Sodium 135 - 145 mmol/L 136  139  135   Potassium 3.5 - 5.1 mmol/L 4.1  4.4  4.2   Chloride 98 - 111 mmol/L 98  104  98   CO2 22 - 32 mmol/L 25  28  24    Calcium  8.9 - 10.3 mg/dL 9.9  9.9  9.5   Total Protein 6.5 - 8.1 g/dL  7.1  6.5   Total Bilirubin 0.3 - 1.2 mg/dL  0.6  0.5   Alkaline Phos 38 - 126 U/L  70  81   AST  15 - 41 U/L  18  17   ALT 0 - 44 U/L  17  14       Assessment & Plan:   Discussion: 63 year old female never smoker with sarcoidosis, HTN, DM2, HLD who presents for follow-up for sarcoid management.  We discussed the clinical course of sarcoid and management including serial PFTs, labs, eye exam, and EKG and chest imaging if indicated. If symptoms suggest sarcoid flare in the future, we would manage with steroids +/- biologics.   Pulmonary sarcoidosis with diffuse lymphadenopathy Chronic bronchitis --Dx in 01/2023  --Reviewed PET myocardial sarcoid. Neg for cardiac sarcoid --Planned for PET[facility] at [facility]. Await results for considering immunosuppression. Would likely pursue methotrexate due to DM2 --Please schedule pulmonary function tests --START Advair  115-21 mcg TWO puffs in the morning and evening. Rinse mouth after use --CONTINUE Albuterol  TWO puffs as needed --USE spacer with inhalers  History of immunosuppression High risk medication management --No prior history --Hold on prednisone  due to uncontrolled DM2. Will await further testing and determine  Sarcoid Monitoring --Recent chest imaging reviewed.  --Annual PFTs.  Need to schedule --Annual ophthalmology exam.  04/2023 --Routine labs as needed: CBC with diff, CMET, 1, 25 and 25 hydroxy vitamin D , urinary calcium   DM2 with peripheral neuropathy --Encouraged to discuss with PCP for aggressive glucose control. Last A1c. Has been referred by Endocrine --Following Dr. Zelda Hickman at [facility] to  rule out neurosarcoid involvement   Health Maintenance Immunization History  Administered Date(s) Administered   Pneumococcal Polysaccharide-23 08/06/2017   CT Lung Screen - not qualified. Never smoker  No orders of the defined types were placed in this encounter.  Meds ordered this encounter  Medications   fluticasone -salmeterol (ADVAIR  HFA) 115-21 MCG/ACT inhaler    Sig: Inhale 2 puffs into the lungs 2 (two) times daily.    Dispense:  12 g    Refill:  5   Spacer/Aero-Holding Chambers (OPTICHAMBER DIAMOND -LG MASK) DEVI    Sig: Dispense 1    Dispense:  1 each    Refill:  1    Return for September.  I have spent a total time of 40-minutes on the day of the appointment including chart review, data review, collecting history, coordinating care and discussing medical diagnosis and plan with the patient/family. Past medical history, allergies, medications were reviewed. Pertinent imaging, labs and tests included in this note have been reviewed and interpreted independently by me.  Adelaine Roppolo Wanda Kenning, MD [facility] 08/04/2023 10:27 AM

## 2023-08-04 NOTE — Patient Instructions (Addendum)
 Pulmonary sarcoidosis with diffuse lymphadenopathy Chronic bronchitis --Planned for PET/CT at [facility]. Await results for considering immunosuppression. Would likely pursue methotrexate due to DM2 --START Advair  115-21 mcg TWO puffs in the morning and evening. Rinse mouth after use --CONTINUE Albuterol  TWO puffs as needed --USE spacer with inhalers

## 2023-08-05 ENCOUNTER — Other Ambulatory Visit: Payer: Self-pay

## 2023-08-06 ENCOUNTER — Encounter (HOSPITAL_BASED_OUTPATIENT_CLINIC_OR_DEPARTMENT_OTHER): Payer: Self-pay | Admitting: Pulmonary Disease

## 2023-08-17 ENCOUNTER — Other Ambulatory Visit: Payer: Self-pay

## 2023-08-17 ENCOUNTER — Other Ambulatory Visit: Payer: Self-pay | Admitting: Nurse Practitioner

## 2023-08-17 DIAGNOSIS — I10 Essential (primary) hypertension: Secondary | ICD-10-CM

## 2023-08-17 MED ORDER — HYDROCHLOROTHIAZIDE 25 MG PO TABS
25.0000 mg | ORAL_TABLET | Freq: Every day | ORAL | 1 refills | Status: DC
  Filled 2023-08-17: qty 90, 90d supply, fill #0
  Filled 2023-11-15: qty 90, 90d supply, fill #1

## 2023-08-20 ENCOUNTER — Other Ambulatory Visit: Payer: Self-pay

## 2023-08-23 ENCOUNTER — Ambulatory Visit: Payer: Medicaid Other | Admitting: Nurse Practitioner

## 2023-08-23 ENCOUNTER — Other Ambulatory Visit (HOSPITAL_BASED_OUTPATIENT_CLINIC_OR_DEPARTMENT_OTHER): Payer: Self-pay

## 2023-08-25 ENCOUNTER — Other Ambulatory Visit: Payer: Self-pay

## 2023-08-31 ENCOUNTER — Telehealth: Payer: Self-pay | Admitting: *Deleted

## 2023-08-31 ENCOUNTER — Other Ambulatory Visit: Payer: Self-pay

## 2023-08-31 NOTE — Progress Notes (Signed)
 Complex Care Management Care Guide Note  08/31/2023 Name: Wanda Weaver MRN: [id number] DOB: [birth year]  Wanda Weaver is a 63 y.o. year old female who is a primary care patient of Jerrlyn Morel, NP and is actively engaged with the care management team. I reached out to Wanda Weaver by phone today to assist with re-scheduling  with the Pharmacist.  Follow up plan: Unsuccessful telephone outreach attempt made. A HIPAA compliant phone message was left for the patient providing contact information and requesting a return call.  Wanda Weaver  [facility]  [facility], [facility] Guide  Direct Dial: [phone]  Fax [phone]

## 2023-09-01 ENCOUNTER — Other Ambulatory Visit: Payer: Self-pay

## 2023-09-01 DIAGNOSIS — E118 Type 2 diabetes mellitus with unspecified complications: Secondary | ICD-10-CM

## 2023-09-01 DIAGNOSIS — D869 Sarcoidosis, unspecified: Secondary | ICD-10-CM

## 2023-09-01 DIAGNOSIS — Z789 Other specified health status: Secondary | ICD-10-CM

## 2023-09-01 NOTE — Patient Instructions (Signed)
 Visit Information  Ms. Moller was given information about Medicaid Managed Care team care coordination services as a part of their [facility] Community Plan Medicaid benefit. Patsi Boots verbally consented to engagement with the [facility] Managed Care team.   If you are experiencing a medical emergency, please call 911 or report to your local emergency department or urgent care.   If you have a non-emergency medical problem during routine business hours, please contact your provider's office and ask to speak with a nurse.   For questions related to your [facility], please call: [phone] or visit the homepage here: [email or web address]  If you would like to schedule transportation through your [facility], please call the following number at least 2 days in advance of your appointment: [phone]   Rides for urgent appointments can also be made after hours by calling Member Services.  Call the Behavioral Health Crisis Line at [phone]) 225-7640, at any time, 24 hours a day, 7 days a week. If you are in danger or need immediate medical attention call 911.  If you would like help to quit smoking, call 1-800-QUIT-NOW ((503) 253-8284) OR Espaol: 1-855-Djelo-Ya (1-324-401-0272) o para ms informacin haga clic aqu or Text READY to 536-644 to register via text  Wanda Weaver - following are the goals we discussed in your visit today:   Goals Addressed             This Visit's Progress    VBCI RN Care Plan       Problems:  Chronic Disease Management support and education needs related to Sarcoidosis  Goal: Over the next 90 days the Patient will attend all scheduled medical appointments: with PCP and specialist as evidenced by keeping al scheduled appointments    demonstrate Ongoing adherence to prescribed treatment plan for Sarcoidosis as evidenced by no admissions to the hospital verbalize  basic understanding of Sarcoidosis disease process and self health management plan as evidenced by verbal explanation lifestyle changes and consistent medication compliance   Interventions:   Evaluation of current treatment plan related to Sarcoidosis,  self-management and patient's adherence to plan as established by provider. Discussed plans with patient for ongoing care management follow up and provided patient with direct contact information for care management team Evaluation of current treatment plan related to Sarcoidosis and patient's adherence to plan as established by provider Reviewed medications with patient and discussed  Reviewed scheduled/upcoming provider appointments Discussed plans with patient for ongoing care management follow up and provided patient with direct contact information for care management team  Patient Self-Care Activities:  Attend all scheduled provider appointments Call pharmacy for medication refills 3-7 days in advance of running out of medications Call provider office for new concerns or questions  Perform all self care activities independently  Take medications as prescribed    Plan:  Telephone follow up appointment with care management team member scheduled for:  10/05/23  130 pm          VBCI RN Care Plan       Problems:  Chronic Disease Management support and education needs related to DMII  Goal: Over the next 60 days the Patient will attend all scheduled medical appointments: with PCP and specialist as evidenced by keeping al scheduled appointments        demonstrate Ongoing adherence to prescribed treatment plan for DM II as evidenced by no admissions to the hospital verbalize basic understanding of DM II disease process and self health management plan  as evidenced by verbal explanation lifestyle changes and consistent medication compliance   Interventions:   Diabetes Interventions: Assessed patient's understanding of A1c goal:  <7% Reviewed medications with patient and discussed importance of medication adherence Counseled on importance of regular laboratory monitoring as prescribed Discussed plans with patient for ongoing care management follow up and provided patient with direct contact information for care management team Review of patient status, including review of consultants reports, relevant laboratory and other test results, and medications completed Screening for signs and symptoms of depression related to chronic disease state  Assessed social determinant of health barriers Lab Results  Component Value Date   HGBA1C 9.4 (A) 05/24/2023    Patient Self-Care Activities:  Attend all scheduled provider appointments Call pharmacy for medication refills 3-7 days in advance of running out of medications Call provider office for new concerns or questions  Take medications as prescribed    Plan:  Telephone follow up appointment with care management team member scheduled for:  10/05/23  130 pm             Please see education materials related to DM II and Sarcoidosis provided by MyChart link.  Patient verbalizes understanding of instructions and care plan provided today and agrees to view in MyChart. Active MyChart status and patient understanding of how to access instructions and care plan via MyChart confirmed with patient.     Telephone follow up appointment with care management team member scheduled for:  10/05/23  130 pm  Augustin Leber RN, BSN, [facility]  [facility], [facility]  Care Coordinator Phone: [phone]      Following is a copy of your plan of care:  There are no care plans that you recently modified to display for this patient.

## 2023-09-01 NOTE — Patient Outreach (Signed)
 Complex Care Management   Visit Note  09/01/2023  Name:  Wanda Weaver MRN: [id number] DOB: 04/20/1960  Situation: Referral received for Complex Care Management related to Diabetes and Sarcoidosis I obtained verbal consent from Patient.  Visit completed with patient  on the phone  Background:   Past Medical History:  Diagnosis Date   Bilateral lower extremity edema 07/2019   Bilateral lower extremity pain 07/2019   Diabetes mellitus without complication (HCC)    Hemoglobin A1C greater than 9%, indicating poor diabetic control    Hyperlipidemia    Hypertension    Vitamin D  deficiency     Assessment: Patient Reported Symptoms:  Cognitive Cognitive Status: Able to follow simple commands, Alert and oriented to person, place, and time, Normal speech and language skills      Neurological Neurological Review of Symptoms: Dizziness Neurological Comment: comes from stress  HEENT HEENT Symptoms Reported: No symptoms reported      Cardiovascular Cardiovascular Symptoms Reported: No symptoms reported    Respiratory Respiratory Symptoms Reported: Shortness of breath, Wheezing Additional Respiratory Details: with exertion Respiratory Conditions: Shortness of breath  Endocrine Is patient diabetic?: Yes Is patient checking blood sugars at home?: Yes Endocrine Conditions: Diabetes Endocrine Management Strategies: Medication therapy, Medical device, Adequate rest  Gastrointestinal Gastrointestinal Symptoms Reported: Diarrhea, Flatulence Gastrointestinal Conditions: Diarrhea Gastrointestinal Management Strategies: Medication therapy    Genitourinary Genitourinary Symptoms Reported: No symptoms reported    Integumentary Integumentary Symptoms Reported: No symptoms reported    Musculoskeletal   Musculoskeletal Conditions: Back pain, Joint pain, Osteoarthritis Musculoskeletal Management Strategies: Medical device, Medication therapy Falls in the past year?: No    Psychosocial        Quality of Family Relationships: supportive Do you feel physically threatened by others?: No      09/01/2023   10:39 AM  Depression screen PHQ 2/9  Decreased Interest 1  Down, Depressed, Hopeless 0  PHQ - 2 Score 1    There were no vitals filed for this visit.  Medications Reviewed Today     Reviewed by Augustin Leber, RN (Registered Nurse) on 09/01/23 at 1015  Med List Status: <None>   Medication Order Taking? Sig Documenting Provider Last Dose Status Informant  Accu-Chek Softclix Lancets lancets [id number] Yes Use as instructed Jerrlyn Morel, NP Taking Active            Med Note Vallarie Gauze, [facility] R   Thu Jun 10, 2023 11:15 AM) Supply  albuterol  (VENTOLIN  HFA) 108 [phone] Base) MCG/ACT inhaler [id number] Yes Inhale 2 puffs into the lungs every 6 (six) hours as needed. Jerrlyn Morel, NP Taking Active   blood glucose meter kit and supplies KIT [id number] Yes Use up to four times daily as directed. Jerrlyn Morel, NP Taking Active            Med Note Vallarie Gauze, [facility] R   Thu Jun 10, 2023 11:15 AM) Supply  Elastic Bandages & Supports (KNEE BRACE ADJUSTABLE HINGED) MISC [id number] No 1 application by Does not apply route daily.  Patient not taking: Reported on 09/01/2023   Gregoria Leas, NP Not Taking Active            Med Note Vallarie Gauze, [facility] R   Thu Jun 10, 2023 11:19 AM) Supply  fluticasone -salmeterol (ADVAIR  HFA) 115-21 MCG/ACT inhaler [id number]  Inhale 2 puffs into the lungs 2 (two) times daily. Quillian Brunt, MD  Active   glucose blood test strip [id number] Yes Use as instructed Jerrlyn Morel, NP Taking  Active            Med Note Vallarie Gauze, [facility] R   Thu Jun 10, 2023 11:19 AM) supply  hydrochlorothiazide  (HYDRODIURIL ) 25 MG tablet [id number] Yes Take 1 tablet (25 mg total) by mouth daily. Jerrlyn Morel, NP Taking Active   insulin  glargine (LANTUS ) 100 UNIT/ML injection [id number] Yes Inject 0.34 mLs (34 Units total) into the skin daily. Jerrlyn Morel, NP Taking Active    Insulin  Syringe-Needle U-100 (INSULIN  SYRINGE .5CC/31GX5/16") 31G X 5/16" 0.5 ML MISC [id number] Yes Use to inject insulin  daily Jerrlyn Morel, NP Taking Active            Med Note Vallarie Gauze, [facility] R   Thu Jun 10, 2023 11:21 AM) Supply  losartan  (COZAAR ) 25 MG tablet [id number] Yes Take 1 tablet (25 mg total) by mouth daily. Jerrlyn Morel, NP Taking Active   metFORMIN  (GLUCOPHAGE ) 500 MG tablet [id number] Yes Take 2 tablets (1,000 mg total) by mouth 2 (two) times daily with a meal. Jerrlyn Morel, NP Taking Active   metoprolol  succinate (TOPROL -XL) 50 MG 24 hr tablet [id number] Yes Take 1 tablet (50 mg total) by mouth daily. Take with or immediately following a meal. Jerrlyn Morel, NP Taking Active   ondansetron  (ZOFRAN ) 4 MG tablet [id number] Yes Take 1 tablet (4 mg total) by mouth every 8 (eight) hours as needed for nausea or vomiting. [name], [name], FNP Taking Active            Med Note Vallarie Gauze, [facility] R   Thu Jun 10, 2023 11:20 AM) Taking PRN  rosuvastatin  (CRESTOR ) 40 MG tablet [id number] Yes Take 1 tablet (40 mg total) by mouth daily. Jerrlyn Morel, NP Taking Active   Spacer/Aero-Holding Idelle Majors [facility] DIAMOND -Marvia Slocumb [phone]  Dispense 1 Quillian Brunt, MD  Active             Recommendation:   Specialty provider follow-up regarding sarcoidosis  Follow Up Plan:   Telephone follow up appointment with care management team member scheduled for:  10/05/23  130 pm  Augustin Leber RN, BSN, [facility] [place]  [facility], [facility]  Care Coordinator Phone: [phone]

## 2023-09-02 ENCOUNTER — Other Ambulatory Visit: Payer: Self-pay

## 2023-09-02 ENCOUNTER — Telehealth: Payer: Self-pay

## 2023-09-02 NOTE — Progress Notes (Signed)
 Complex Care Management Care Guide Note  09/02/2023 Name: Wanda Weaver MRN: [id number] DOB: [birth year]  Wanda Weaver is a 63 y.o. year old female who is a primary care patient of Jerrlyn Morel, NP and is actively engaged with the care management team. I reached out to Patsi Boots by phone today to assist with re-scheduling  with the Pharmacist.  Follow up plan: Telephone appointment with complex care management team member scheduled for:  6/9 3:30 PM  Barnie Bora  [facility]  [facility], [facility] Guide  Direct Dial: [phone]  Fax [phone]

## 2023-09-02 NOTE — Progress Notes (Signed)
   Telephone encounter was:  Successful.  Complex Care Management Note Care Guide Note  09/02/2023 Name: Wanda Weaver MRN: [id number] DOB: Mar 27, 1961  Wanda Weaver is a 63 y.o. year old female who is a primary care patient of Jerrlyn Morel, NP . The community resource team was consulted for assistance with Food Insecurity and Financial Difficulties related to Utilities  SDOH screenings and interventions completed:  Yes  Social Drivers of Health From This Encounter   Food Insecurity: Food Insecurity Present (09/02/2023)   Hunger Vital Sign    Worried About Running Out of Food in the Last Year: Often true    Ran Out of Food in the Last Year: Often true  Financial Resource Strain: High Risk (09/02/2023)   Overall Financial Resource Strain (CARDIA)    Difficulty of Paying Living Expenses: Very hard  Utilities: At Risk (09/02/2023)   Utilities    Threatened with loss of utilities: Yes    SDOH Interventions Today    Flowsheet Row Most Recent Value  SDOH Interventions   Food Insecurity Interventions Community Resources Provided, [id number] Referral  Utilities Interventions Community Resources Provided, [id number] Referral  Financial Strain Interventions Community Resources Provided, [facility] Referral        Care guide performed the following interventions: Patient provided with information about care guide support team and interviewed to confirm resource needs.Pt stated she needs assistance with Utilities. Pt has to pay $500 of a $1000 bill by the end of the month. Patient cant work due to her health.   Follow Up Plan:  No further follow up planned at this time. The patient has been provided with needed resources.  Encounter Outcome:  Patient Visit Completed    Wanda Weaver [facility]  [facility] Guide, Phone: [phone] Fax: [phone] Website: [email or web address]

## 2023-09-02 NOTE — Progress Notes (Signed)
 Complex Care Management Care Guide Note  09/02/2023 Name: Wanda Weaver MRN: [id number] DOB: 1960-06-19  Wanda Weaver is a 63 y.o. year old female who is a primary care patient of Jerrlyn Morel, NP and is actively engaged with the care management team. I reached out to Patsi Boots by phone today to assist with re-scheduling  with the Pharmacist.  Follow up plan: Unsuccessful telephone outreach attempt made. A HIPAA compliant phone message was left for the patient providing contact information and requesting a return call.  Barnie Bora  [facility]  [facility], [facility] Guide  Direct Dial: [phone]  Fax [phone]

## 2023-09-10 ENCOUNTER — Other Ambulatory Visit: Payer: Self-pay | Admitting: Nurse Practitioner

## 2023-09-10 ENCOUNTER — Other Ambulatory Visit: Payer: Self-pay

## 2023-09-10 MED ORDER — ROSUVASTATIN CALCIUM 40 MG PO TABS
40.0000 mg | ORAL_TABLET | Freq: Every day | ORAL | 1 refills | Status: DC
  Filled 2023-09-10: qty 90, 90d supply, fill #0
  Filled 2023-12-12: qty 90, 90d supply, fill #1

## 2023-09-13 ENCOUNTER — Other Ambulatory Visit: Payer: Self-pay

## 2023-09-13 ENCOUNTER — Encounter: Payer: Self-pay | Admitting: Nurse Practitioner

## 2023-09-13 ENCOUNTER — Ambulatory Visit (INDEPENDENT_AMBULATORY_CARE_PROVIDER_SITE_OTHER): Payer: Self-pay | Admitting: Nurse Practitioner

## 2023-09-13 VITALS — BP 158/61 | HR 72 | Wt 212.2 lb

## 2023-09-13 DIAGNOSIS — Z794 Long term (current) use of insulin: Secondary | ICD-10-CM | POA: Diagnosis not present

## 2023-09-13 DIAGNOSIS — Z1211 Encounter for screening for malignant neoplasm of colon: Secondary | ICD-10-CM | POA: Diagnosis not present

## 2023-09-13 DIAGNOSIS — Z139 Encounter for screening, unspecified: Secondary | ICD-10-CM

## 2023-09-13 DIAGNOSIS — E118 Type 2 diabetes mellitus with unspecified complications: Secondary | ICD-10-CM | POA: Diagnosis not present

## 2023-09-13 LAB — POCT GLYCOSYLATED HEMOGLOBIN (HGB A1C): Hemoglobin A1C: 9.6 % — AB (ref 4.0–5.6)

## 2023-09-13 MED ORDER — ESCITALOPRAM OXALATE 10 MG PO TABS
10.0000 mg | ORAL_TABLET | Freq: Every day | ORAL | 2 refills | Status: AC
  Filled 2023-09-13: qty 30, 30d supply, fill #0
  Filled 2023-10-24: qty 30, 30d supply, fill #1

## 2023-09-13 NOTE — Progress Notes (Unsigned)
 Subjective   Patient ID: Wanda Weaver, female    DOB: 09-Apr-1961, 63 y.o.   MRN: [id number]  Chief Complaint  Patient presents with   Medical Management of Chronic Issues    Referring provider: Jerrlyn Morel, NP  Wanda Weaver is a 63 y.o. female with Past Medical History: 07/2019: Bilateral lower extremity edema 07/2019: Bilateral lower extremity pain No date: Diabetes mellitus without complication (HCC) No date: Hemoglobin A1C greater than 9%, indicating poor diabetic  control No date: Hyperlipidemia No date: Hypertension No date: Vitamin D  deficiency   HPI  Patient presents today for a follow-up visit.  She states that she was told by neurology to stop her metformin  but blood sugars did increase to over 500.  Patient did per herself back on metformin .  We will place referral to pharmacy for diabetic medication management.  Patient states that she was started on Advair  by pulmonary but this did increase her blood sugars as well.  Patient is followed by pulmonary neurology for sarcoidosis  Denies f/c/s, n/v/d, hemoptysis, PND, leg swelling Denies chest pain or edema    Allergies  Allergen Reactions   Ibuprofen  Palpitations    Immunization History  Administered Date(s) Administered   Pneumococcal Polysaccharide-23 08/06/2017    Tobacco History: Social History   Tobacco Use  Smoking Status Never  Smokeless Tobacco Never   Counseling given: Not Answered   Outpatient Encounter Medications as of 09/13/2023  Medication Sig   Accu-Chek Softclix Lancets lancets Use as instructed   albuterol  (VENTOLIN  HFA) 108 (90 Base) MCG/ACT inhaler Inhale 2 puffs into the lungs every 6 (six) hours as needed.   blood glucose meter kit and supplies KIT Use up to four times daily as directed.   escitalopram (LEXAPRO) 10 MG tablet Take 1 tablet (10 mg total) by mouth daily.   glucose blood test strip Use as instructed   hydrochlorothiazide  (HYDRODIURIL ) 25 MG tablet Take 1 tablet (25 mg  total) by mouth daily.   insulin  glargine (LANTUS ) 100 UNIT/ML injection Inject 0.34 mLs (34 Units total) into the skin daily.   Insulin  Syringe-Needle U-100 (INSULIN  SYRINGE .5CC/31GX5/16") 31G X 5/16" 0.5 ML MISC Use to inject insulin  daily   losartan  (COZAAR ) 25 MG tablet Take 1 tablet (25 mg total) by mouth daily.   metFORMIN  (GLUCOPHAGE ) 500 MG tablet Take 2 tablets (1,000 mg total) by mouth 2 (two) times daily with a meal.   metoprolol  succinate (TOPROL -XL) 50 MG 24 hr tablet Take 1 tablet (50 mg total) by mouth daily. Take with or immediately following a meal.   ondansetron  (ZOFRAN ) 4 MG tablet Take 1 tablet (4 mg total) by mouth every 8 (eight) hours as needed for nausea or vomiting.   rosuvastatin  (CRESTOR ) 40 MG tablet Take 1 tablet (40 mg total) by mouth daily.   Elastic Bandages & Supports (KNEE BRACE ADJUSTABLE HINGED) MISC 1 application by Does not apply route daily. (Patient not taking: Reported on 09/01/2023)   fluticasone -salmeterol (ADVAIR  HFA) 115-21 MCG/ACT inhaler Inhale 2 puffs into the lungs 2 (two) times daily.   Spacer/Aero-Holding Chambers (OPTICHAMBER DIAMOND -LG MASK) DEVI Dispense 1   No facility-administered encounter medications on file as of 09/13/2023.    Review of Systems  Review of Systems  Constitutional: Negative.   HENT: Negative.    Cardiovascular: Negative.   Gastrointestinal: Negative.   Allergic/Immunologic: Negative.   Neurological: Negative.   Psychiatric/Behavioral: Negative.       Objective:   BP (!) 158/61   Pulse 72  Wt 212 lb 3.2 oz (96.3 kg)   SpO2 100%   BMI 38.81 kg/m   Wt Readings from Last 5 Encounters:  09/13/23 212 lb 3.2 oz (96.3 kg)  08/04/23 212 lb 8 oz (96.4 kg)  05/24/23 211 lb 6.4 oz (95.9 kg)  04/16/23 207 lb (93.9 kg)  01/19/23 206 lb (93.4 kg)     Physical Exam Vitals and nursing note reviewed.  Constitutional:      General: She is not in acute distress.    Appearance: She is well-developed.   Cardiovascular:     Rate and Rhythm: Normal rate and regular rhythm.  Pulmonary:     Effort: Pulmonary effort is normal.     Breath sounds: Normal breath sounds.  Neurological:     Mental Status: She is alert and oriented to person, place, and time.       Assessment & Plan:   Screen for colon cancer -     Cologuard  Type 2 diabetes mellitus with complication, with long-term current use of insulin  (HCC) -     Microalbumin / creatinine urine ratio -     POCT glycosylated hemoglobin (Hb A1C) -     AMB Referral VBCI Care Management  Other orders -     Escitalopram Oxalate; Take 1 tablet (10 mg total) by mouth daily.  Dispense: 30 tablet; Refill: 2     Return in about 3 months (around 12/14/2023).   Jerrlyn Morel, NP 09/13/2023

## 2023-09-14 ENCOUNTER — Other Ambulatory Visit: Payer: Self-pay

## 2023-09-14 LAB — MICROALBUMIN / CREATININE URINE RATIO
Creatinine, Urine: 40.5 mg/dL
Microalb/Creat Ratio: 46 mg/g{creat} — ABNORMAL HIGH (ref 0–29)
Microalbumin, Urine: 18.7 ug/mL

## 2023-09-15 ENCOUNTER — Encounter (HOSPITAL_BASED_OUTPATIENT_CLINIC_OR_DEPARTMENT_OTHER): Payer: Self-pay

## 2023-09-17 ENCOUNTER — Other Ambulatory Visit: Payer: Self-pay

## 2023-09-17 ENCOUNTER — Telehealth: Payer: Self-pay

## 2023-09-17 NOTE — Telephone Encounter (Signed)
 Copied from CRM [id number]. Topic: Clinical - Lab/Test Results >> Sep 17, 2023  2:03 PM Sasha H wrote: Reason for CRM: pt called in wanting to speak with someone regarding urine test results.

## 2023-09-20 ENCOUNTER — Telehealth: Payer: Self-pay

## 2023-09-20 ENCOUNTER — Other Ambulatory Visit: Payer: Self-pay

## 2023-09-20 ENCOUNTER — Other Ambulatory Visit (HOSPITAL_COMMUNITY): Payer: Self-pay

## 2023-09-20 DIAGNOSIS — Z794 Long term (current) use of insulin: Secondary | ICD-10-CM

## 2023-09-20 DIAGNOSIS — E118 Type 2 diabetes mellitus with unspecified complications: Secondary | ICD-10-CM

## 2023-09-20 DIAGNOSIS — I10 Essential (primary) hypertension: Secondary | ICD-10-CM

## 2023-09-20 NOTE — Telephone Encounter (Signed)
 Copied from CRM [id number]. Topic: Clinical - Lab/Test Results >> Sep 20, 2023  3:23 PM Elle L wrote: Reason for CRM: The patient was returning a call from Julian Obey, RMA regarding her urinalysis but she was assisting other patients at this time. The patient's call back number is [phone].  Pt was called and advised. KH

## 2023-09-20 NOTE — Progress Notes (Signed)
 09/20/2023 Name: Wanda Weaver MRN: [id number] DOB: [birth year]  Chief Complaint  Patient presents with   Diabetes   Medication Management    Wanda Weaver is a 64 y.o. year old female who presented for a telephone visit.   They were referred to the pharmacist by their PCP for assistance in managing diabetes. PMH includes T2DM, HLD, HTN, pulmonary sarcoidosis.   Subjective: She was last seen by her PCP, Abbey Hobby, NP on 09/13/23. BP at that visit was 158/61 mmHg. Patient reported that she was told by neurology to stop metformin , but BG increased > 500 mg/dL and she put herself back on metformin . Patient also reported that Advair , prescribed by pulmonology, increased her BG  Reports she is doing ok. Has been frustrated by slow workup and imaging for sarcoidosis. She reports she has had a lot of MRIs but has not been started on any medications. Also, yesterday - she had a pulse ox of 80 with heart racing. Reports that it increased to 92% after a while.   Care Team: Primary Care Provider: Jerrlyn Morel, NP ; Next Scheduled Visit: 12/15/23  Medication Access/Adherence  Current Pharmacy:  [facility] - [facility] [address], [address] [place] [address] Phone: [phone] Fax: [phone]  [facility] [place] - [facility] [address] [place] [address] Phone: [phone] Fax: [phone]   Patient reports affordability concerns with their medications: Yes  - not working since sarcoidosis dx Patient reports access/transportation concerns to their pharmacy: Yes  - Daughter has to take her to appt.  Patient reports adherence concerns with their medications:  No     Diabetes:  Current medications: Lantus  (vial) 34 units injected once daily, metformin  IR 1000 mg BID,  Medications tried in the past: Jardiance  - consistent burning/itching while urinating; Ozempic  - severe nausea/vomiting. Patient reports that  insulin  pen needles are more painful for her to inject than the insulin  syringe/vial.   Current glucose readings: average 201 09/19/23: 8AM 157, 12PM 163,  09/18/23: 8AM 207, 12PM 201,  09/17/23: 6AM 196, 12PM 177  09/15/23: Patient is not able to review logbook any further.   Using Accu-Chek glucometer; testing 3 times daily - checks in the AM, noon, and PM  Reports yesterday her BG dropped from 160 > 130 a couple times due to being more active walking around at a graduation.   Patient denies hypoglycemic s/sx including dizziness, sweating. Patient reports hyperglycemic symptoms including shakiness with BG of 130 yesterday at a family member's graduation.   polyuria, polydipsia, polyphagia, nocturia, neuropathy, blurred vision.  Has history of BG dropping to 27 mg/dL - almost lost consciousness. Had worked 14 hours that day, did not have time to eat. So she does have some fear of hypoglycemia.   Current meal patterns: 2-3 meals per day, but some days less. Did not discuss diet in detail.  - Drinks - zero sugar juice, sugar free tea, water 4-5 glasses per day  Current physical activity: limited by ShOB - walks to mailbox and back daily, laundry at home. Has cane and walker.   Hypertension:  Current medications: hydrochlorothiazide  25 mg daily, losartan  25 mg daily, metoprolol  succinate 50 mg daily  Patient has a validated, automated, upper arm home BP cuff Current blood pressure readings readings: 132/60 mmHg yesterday. Reports that top number is usually 140-150s mmHg.   Patient denies hypotensive s/sx including dizziness, lightheadedness.  Patient denies hypertensive symptoms including headache, chest pain, shortness of breath  Hyperlipidemia/ASCVD Risk Reduction  Current lipid lowering medications: rosuvastatin  40 mg daily (has to split pill to swallow)  Clinical ASCVD: No  The 10-year ASCVD risk score (Arnett DK, et al., 2019) is: 17.7%   Values used to calculate the score:      Age: 79 years     Sex: Female     Is Non-Hispanic African American: No     Diabetic: Yes     Tobacco smoker: No     Systolic Blood Pressure: 158 mmHg     Is BP treated: Yes     HDL Cholesterol: 47 mg/dL     Total Cholesterol: 221 mg/dL    Objective:  BP Readings from Last 3 Encounters:  09/13/23 (!) 158/61  08/04/23 128/84  05/24/23 (!) 164/67     Lab Results  Component Value Date   HGBA1C 9.6 (A) 09/13/2023    Lab Results  Component Value Date   CREATININE 1.29 (H) 01/19/2023   BUN 22 01/19/2023   NA 136 01/19/2023   K 4.1 01/19/2023   CL 98 01/19/2023   CO2 25 01/19/2023    Lab Results  Component Value Date   CHOL 221 (H) 05/08/2022   HDL 47 05/08/2022   LDLCALC 150 (H) 05/08/2022   TRIG 131 05/08/2022   CHOLHDL 4.7 (H) 05/08/2022   UACR 09/13/23: 46 mg/g   eGFR 01/19/23 > 47 mL/min  Vitamin D3 07/01/23: < 4 ng/mL - Patient was told she should not take vitamin D  supplement with sarcoidosis.   Medications Reviewed Today     Reviewed by Adra Alanis, RPH (Pharmacist) on 09/20/23 at 1717  Med List Status: <None>   Medication Order Taking? Sig Documenting Provider Last Dose Status Informant  Accu-Chek Softclix Lancets lancets [id number]  Use as instructed [name], [name], NP  Active            Med Note Vallarie Gauze, [facility] R   Thu Jun 10, 2023 11:15 AM) Supply  albuterol  (VENTOLIN  HFA) 108 (90 Base) MCG/ACT inhaler [id number]  Inhale 2 puffs into the lungs every 6 (six) hours as needed. Jerrlyn Morel, NP  Active   blood glucose meter kit and supplies KIT [id number]  Use up to four times daily as directed. Jerrlyn Morel, NP  Active            Med Note Vallarie Gauze, [facility] R   Thu Jun 10, 2023 11:15 AM) Supply  Elastic Bandages & Supports (KNEE BRACE ADJUSTABLE HINGED) MISC [id number]  1 application by Does not apply route daily.  Patient not taking: Reported on 09/01/2023   Gregoria Leas, NP  Active            Med Note Vallarie Gauze, [facility] R   Thu Jun 10, 2023 11:19 AM)  Supply  escitalopram  (LEXAPRO ) 10 MG tablet [id number]  Take 1 tablet (10 mg total) by mouth daily. Jerrlyn Morel, NP  Active   fluticasone -salmeterol (ADVAIR  HFA) 115-21 MCG/ACT inhaler [id number]  Inhale 2 puffs into the lungs 2 (two) times daily. Quillian Brunt, MD  Active   glucose blood test strip [id number]  Use as instructed Jerrlyn Morel, NP  Active            Med Note Vallarie Gauze, [facility] R   Thu Jun 10, 2023 11:19 AM) supply  hydrochlorothiazide  (HYDRODIURIL ) 25 MG tablet [id number] Yes Take 1 tablet (25 mg total) by mouth daily. Jerrlyn Morel, NP Taking Active   insulin  glargine (LANTUS ) 100 UNIT/ML injection  [id number] Yes Inject 0.34 mLs (34 Units total) into the skin daily. Jerrlyn Morel, NP Taking Active   Insulin  Syringe-Needle U-100 (INSULIN  SYRINGE .5CC/31GX5/16") 31G X 5/16" 0.5 ML MISC [id number]  Use to inject insulin  daily [name], [name], NP  Active            Med Note Vallarie Gauze, [facility] R   Thu Jun 10, 2023 11:21 AM) Supply  losartan  (COZAAR ) 25 MG tablet [id number] Yes Take 1 tablet (25 mg total) by mouth daily. Jerrlyn Morel, NP Taking Active   metFORMIN  (GLUCOPHAGE ) 500 MG tablet [id number] Yes Take 2 tablets (1,000 mg total) by mouth 2 (two) times daily with a meal. Jerrlyn Morel, NP Taking Active   metoprolol  succinate (TOPROL -XL) 50 MG 24 hr tablet [id number] Yes Take 1 tablet (50 mg total) by mouth daily. Take with or immediately following a meal. Jerrlyn Morel, NP Taking Active   ondansetron  (ZOFRAN ) 4 MG tablet [id number]  Take 1 tablet (4 mg total) by mouth every 8 (eight) hours as needed for nausea or vomiting. [name], [name], FNP  Active            Med Note Vallarie Gauze, [facility] R   Thu Jun 10, 2023 11:20 AM) Taking PRN  rosuvastatin  (CRESTOR ) 40 MG tablet [id number] Yes Take 1 tablet (40 mg total) by mouth daily. Jerrlyn Morel, NP Taking Active   Spacer/Aero-Holding Idelle Majors [facility] DIAMOND -Xavier Heinrich Morrice) DEVI [id number]  Dispense 1 Quillian Brunt, MD   Active             Assessment/Plan:   Diabetes: - Currently uncontrolled with last A1C of 9.6% above goal < 7%. Patient is a good candidate for a GLP-1RA with BMI > 35 and uncontrolled BG. She is interested in starting this medications, despite hx of intolerance with Ozempic , as it may help with weight loss and avoid titrating insulin . Discussed with her that it is less potent than Ozempic , so it may be better tolerated, but she still may experience GI side effects. If she does not tolerate Trulicity we will titrate basal insulin . With her last eGFR of 47 mL/min, would recommend max metformin  dose of 1000 mg per day.  - Reviewed long term cardiovascular and renal outcomes of uncontrolled blood sugar - Reviewed goal A1c, goal fasting, and goal 2 hour post prandial glucose - Reviewed dietary modifications including eating small meals and snacks throughout the day, staying hydrated with water, and emphasized protein intake with each meal.  - Reviewed lifestyle modifications including: increasing physical activity as able.  - Recommend to continue Lantus  34 units daily  - Recommend to decrease metformin  to 1 tablet (500 mg) twice daily for reduced kidney function - Recommend to start Trulicity 0.75 mg once weekly. Educated on administration and side effects. Submitted PA for Trulicity - (Key: [id number]) - in process.  - Patient denies personal or family history of multiple endocrine neoplasia type 2, medullary thyroid  cancer; personal history of pancreatitis or gallbladder disease. - Recommend to check glucose three times daily with glucometer. Patient reports she has been denied coverage for CGM in the past. Expect she would be approved now, but she would like to continue fingersticks.    Hypertension: - Currently uncontrolled with last clinic BP above goal < 130/80 mmHg, which is consistent with home readings. UACR demonstrates microalbuminuria. Appropriate to increase dose of ARB. Patient is in  agreement with plan. Will repeat BMP to monitor K and Scr in 3 weeks.  - Reviewed  long term cardiovascular and renal outcomes of uncontrolled blood pressure - Reviewed appropriate blood pressure monitoring technique and reviewed goal blood pressure. Recommended to check home blood pressure and heart rate once daily.  - Recommend to continue hydrochlorothiazide  25 mg daily, metoprolol  succinate 50 mg daily  - Recommend to increase losartan  to 50 mg daily     Hyperlipidemia/ASCVD Risk Reduction: - Currently uncontrolled with last LDL-C 150 mg/dL above goal < 70 mg/dL while adherent to high intensity statin. Will discuss options for add-on therapy at follow-up. Patient does have financial constraints despite $4 copay per medication.  - Reviewed long term complications of uncontrolled cholesterol - Recommend to continue rosuvastatin  40 mg daily  Follow Up Plan: Made lab appt for 10/12/23 at [facility], pharmacist telephone 11/08/23, PCP 12/15/23  Arthea Larsson, PharmD PGY1 Pharmacy Resident

## 2023-09-21 ENCOUNTER — Telehealth: Payer: Self-pay

## 2023-09-21 ENCOUNTER — Other Ambulatory Visit: Payer: Self-pay

## 2023-09-21 ENCOUNTER — Other Ambulatory Visit

## 2023-09-21 MED ORDER — LOSARTAN POTASSIUM 50 MG PO TABS
50.0000 mg | ORAL_TABLET | Freq: Every day | ORAL | 3 refills | Status: DC
  Filled 2023-09-21: qty 90, 90d supply, fill #0

## 2023-09-21 MED ORDER — TRULICITY 0.75 MG/0.5ML ~~LOC~~ SOAJ
0.7500 mg | SUBCUTANEOUS | 1 refills | Status: DC
  Filled 2023-09-21: qty 2, 28d supply, fill #0
  Filled 2023-10-17: qty 2, 28d supply, fill #1

## 2023-09-21 MED ORDER — METFORMIN HCL 500 MG PO TABS
500.0000 mg | ORAL_TABLET | Freq: Two times a day (BID) | ORAL | 3 refills | Status: AC
  Filled 2023-09-21: qty 180, 90d supply, fill #0

## 2023-09-21 NOTE — Progress Notes (Signed)
   Telephone encounter was:  Unsuccessful.  09/21/2023 Name: Latresha Yahr MRN: [id number] DOB: 1960/08/18  Unsuccessful outbound call made today to assist with:  Utilities  Outreach Attempt:  3rd Attempt.  Referral closed unable to contact patient.  No answer   Azell Leopard [facility]  [facility] Guide, Phone: [phone] Fax: [phone] Website: [email or web address]

## 2023-09-23 ENCOUNTER — Other Ambulatory Visit: Payer: Self-pay

## 2023-09-27 ENCOUNTER — Other Ambulatory Visit: Payer: Self-pay

## 2023-09-28 ENCOUNTER — Other Ambulatory Visit: Payer: Self-pay

## 2023-09-29 ENCOUNTER — Other Ambulatory Visit: Payer: Self-pay

## 2023-09-30 ENCOUNTER — Other Ambulatory Visit: Payer: Self-pay

## 2023-10-01 ENCOUNTER — Other Ambulatory Visit: Payer: Self-pay

## 2023-10-04 ENCOUNTER — Other Ambulatory Visit: Payer: Self-pay

## 2023-10-05 ENCOUNTER — Other Ambulatory Visit: Payer: Self-pay

## 2023-10-05 DIAGNOSIS — D869 Sarcoidosis, unspecified: Secondary | ICD-10-CM

## 2023-10-05 DIAGNOSIS — Z789 Other specified health status: Secondary | ICD-10-CM

## 2023-10-05 MED ORDER — FOLIC ACID 1 MG PO TABS
1.0000 mg | ORAL_TABLET | Freq: Every day | ORAL | 7 refills | Status: DC
  Filled 2023-10-05: qty 30, 30d supply, fill #0
  Filled 2023-11-08: qty 30, 30d supply, fill #1
  Filled 2023-11-28 – 2023-12-12 (×4): qty 30, 30d supply, fill #2

## 2023-10-05 MED ORDER — METHOTREXATE SODIUM 2.5 MG PO TABS
ORAL_TABLET | ORAL | 0 refills | Status: AC
  Filled 2023-10-05: qty 16, 28d supply, fill #0
  Filled 2023-11-01: qty 16, fill #1
  Filled 2023-11-03: qty 24, 28d supply, fill #1

## 2023-10-05 NOTE — Patient Outreach (Signed)
 Complex Care Management   Visit Note  10/05/2023  Name:  Wanda Weaver MRN: [id number] DOB: [birth year]  Situation: Referral received for Complex Care Management related to Sarcoidosis I obtained verbal consent from Patient.  Visit completed with patient  on the phone  Background:   Past Medical History:  Diagnosis Date   Bilateral lower extremity edema 07/2019   Bilateral lower extremity pain 07/2019   Diabetes mellitus without complication (HCC)    Hemoglobin A1C greater than 9%, indicating poor diabetic control    Hyperlipidemia    Hypertension    Vitamin D  deficiency     Assessment: I engaged in a discussion with the patient, who expressed that a shower chair would significantly enhance her bathing experience. In response, I communicated with her healthcare provider to facilitate the acquisition of one. Additionally, I provided her with information regarding community resources accessible through the MyChart application and submitted a referral to the social worker to offer her further support.   Patient Reported Symptoms:  Cognitive Cognitive Status: Able to follow simple commands, Alert and oriented to person, place, and time, Normal speech and language skills      Neurological Neurological Review of Symptoms: Dizziness Neurological Comment: Comes from stress  HEENT HEENT Symptoms Reported: No symptoms reported      Cardiovascular Cardiovascular Symptoms Reported: No symptoms reported    Respiratory Respiratory Symptoms Reported: Shortness of breath, Wheezing Additional Respiratory Details: with exertion Respiratory Conditions: Shortness of breath  Endocrine Is patient diabetic?: Yes Is patient checking blood sugars at home?: Yes Endocrine Conditions: Diabetes Endocrine Management Strategies: Adequate rest, Medical device, Medication therapy  Gastrointestinal Gastrointestinal Symptoms Reported: Diarrhea Gastrointestinal Conditions: Diarrhea Gastrointestinal Management  Strategies: Medication therapy    Genitourinary Genitourinary Symptoms Reported: No symptoms reported    Integumentary Integumentary Symptoms Reported: No symptoms reported    Musculoskeletal Musculoskelatal Symptoms Reviewed: Difficulty walking, Weakness Musculoskeletal Conditions: Back pain, Joint pain, Osteoarthritis Falls in the past year?: No    Psychosocial       Quality of Family Relationships: supportive, involved Do you feel physically threatened by others?: No      10/05/2023    2:14 PM  Depression screen PHQ 2/9  Decreased Interest 0  Down, Depressed, Hopeless 0  PHQ - 2 Score 0    There were no vitals filed for this visit.  Medications Reviewed Today     Reviewed by Weyman Corning, RN (Registered Nurse) on 10/05/23 at 1337  Med List Status: <None>   Medication Order Taking? Sig Documenting Provider Last Dose Status Informant  Accu-Chek Softclix Lancets lancets [id number] Yes Use as instructed Oley Bascom RAMAN, NP  Active            Med Note MAYER, [facility] R   Thu Jun 10, 2023 11:15 AM) Supply  albuterol  (VENTOLIN  HFA) 108 (90 Base) MCG/ACT inhaler [id number] Yes Inhale 2 puffs into the lungs every 6 (six) hours as needed. Oley Bascom RAMAN, NP  Active   blood glucose meter kit and supplies KIT [id number] Yes Use up to four times daily as directed. [name], [name], NP  Active            Med Note MAYER, [facility] R   Thu Jun 10, 2023 11:15 AM) Supply  Dulaglutide  (TRULICITY ) 0.75 MG/0.5ML [name] [id number]  Inject 0.75 mg into the skin once a week. [name], [name], FNP  Active   Elastic Bandages & Supports (KNEE BRACE ADJUSTABLE HINGED) MISC [id number] Yes 1 application by Does not apply route  daily. [name], NP  Active            Med Note MAYER, [facility] R   Thu Jun 10, 2023 11:19 AM) Supply  escitalopram  (LEXAPRO ) 10 MG tablet [id number] Yes Take 1 tablet (10 mg total) by mouth daily. Oley Bascom RAMAN, NP  Active   fluticasone -salmeterol (ADVAIR  HFA) 115-21  MCG/ACT inhaler [id number] Yes Inhale 2 puffs into the lungs 2 (two) times daily.  Patient taking differently: Inhale 2 puffs into the lungs 2 (two) times daily. Uses as needed   [name], MD  Active   folic acid  (FOLVITE ) 1 MG tablet [id number] Yes Take 1 tablet (1 mg total) by mouth daily.   Active   glucose blood test strip [id number] Yes Use as instructed Oley Bascom RAMAN, NP  Active            Med Note MAYER, [facility] R   Thu Jun 10, 2023 11:19 AM) supply  hydrochlorothiazide  (HYDRODIURIL ) 25 MG tablet [id number] Yes Take 1 tablet (25 mg total) by mouth daily. Oley Bascom RAMAN, NP  Active   insulin  glargine (LANTUS ) 100 UNIT/ML injection [id number] Yes Inject 0.34 mLs (34 Units total) into the skin daily. Oley Bascom RAMAN, NP  Active   Insulin  Syringe-Needle U-100 (INSULIN  SYRINGE .5CC/31GX5/16) 31G X 5/16 0.5 ML MISC [id number] Yes Use to inject insulin  daily [name], [name], NP  Active            Med Note MAYER, [facility] R   Thu Jun 10, 2023 11:21 AM) Supply  losartan  (COZAAR ) 50 MG tablet [id number]  Take 1 tablet (50 mg total) by mouth daily. [name], [name], FNP  Active   metFORMIN  (GLUCOPHAGE ) 500 MG tablet [id number] Yes Take 1 tablet (500 mg total) by mouth 2 (two) times daily with a meal. [name], [name], FNP  Active   methotrexate (RHEUMATREX) 2.5 MG tablet [id number] Yes Take 4 tablets (10 mg total) by mouth once a week for 30 days, THEN 6 tablets (15 mg total) once a week.   Active   metoprolol  succinate (TOPROL -XL) 50 MG 24 hr tablet [id number] Yes Take 1 tablet (50 mg total) by mouth daily. Take with or immediately following a meal. Oley Bascom RAMAN, NP  Active   ondansetron  (ZOFRAN ) 4 MG tablet [id number] Yes Take 1 tablet (4 mg total) by mouth every 8 (eight) hours as needed for nausea or vomiting. [name], [name], FNP  Active            Med Note MAYER, [facility] R   Thu Jun 10, 2023 11:20 AM) Taking PRN  rosuvastatin  (CRESTOR ) 40 MG tablet [id number] Yes Take 1  tablet (40 mg total) by mouth daily. Oley Bascom RAMAN, NP  Active   Spacer/Aero-Holding [facility] DIAMOND -RUTHER LAVELLE FRENCH [id number]  Dispense 1 [name], MD  Active             Recommendation:   PCP Follow-up  Follow Up Plan:   Telephone follow up appointment date/time:  11/04/23  145 pm  Wilbert Diver RN, BSN, [facility] [place]  [facility], [facility]    Care Coordinator Phone: [phone]

## 2023-10-05 NOTE — Patient Instructions (Signed)
 Visit Information  Wanda Weaver was given information about Medicaid Managed Care team care coordination services as a part of their [facility] Community Plan Medicaid benefit. Wanda Weaver verbally consented to engagement with the [facility] Managed Care team.   If you are experiencing a medical emergency, please call 911 or report to your local emergency department or urgent care.   If you have a non-emergency medical problem during routine business hours, please contact your provider's office and ask to speak with a nurse.   For questions related to your [facility], please call: [phone] or visit the homepage here: [email or web address]  If you would like to schedule transportation through your [facility], please call the following number at least 2 days in advance of your appointment: [phone]   Rides for urgent appointments can also be made after hours by calling Member Services.  Call the Behavioral Health Crisis Line at [phone], at any time, 24 hours a day, 7 days a week. If you are in danger or need immediate medical attention call 911.  If you would like help to quit smoking, call 1-800-QUIT-NOW (514-811-1452) OR Espaol: 1-855-Djelo-Ya (8-144-664-6430) o para ms informacin haga clic aqu or Text READY to 799-599 to register via text  Wanda Weaver - following are the goals we discussed in your visit today:   Goals Addressed             This Visit's Progress    VBCI RN Care Plan-Sarcoidosis       Problems:  Chronic Disease Management support and education needs related to Sarcoidosis  Goal: Over the next 90 days the Patient will attend all scheduled medical appointments: with PCP and specialist as evidenced by keeping al scheduled appointments    demonstrate Ongoing adherence to prescribed treatment plan for Sarcoidosis as evidenced by no admissions to the  hospital verbalize basic understanding of Sarcoidosis disease process and self health management plan as evidenced by verbal explanation lifestyle changes and consistent medication compliance   Interventions:   Evaluation of current treatment plan related to Sarcoidosis,  self-management and patient's adherence to plan as established by provider. Discussed plans with patient for ongoing care management follow up and provided patient with direct contact information for care management team Evaluation of current treatment plan related to Sarcoidosis and patient's adherence to plan as established by provider Reviewed medications with patient and discussed  Reviewed scheduled/upcoming provider appointments Discussed plans with patient for ongoing care management follow up and provided patient with direct contact information for care management team Rest Take your medications  Patient Self-Care Activities:  Attend all scheduled provider appointments Call pharmacy for medication refills 3-7 days in advance of running out of medications Call provider office for new concerns or questions  Perform all self care activities independently  Take medications as prescribed    Plan:  Telephone follow up appointment with care management team member scheduled for:  11/04/23  145 pm             Please see education materials related to Sarcoidosis provided by MyChart link.  The patient verbalized understanding of instructions, educational materials, and care plan provided today and DECLINED offer to receive copy of patient instructions, educational materials, and care plan.   Telephone follow up appointment with the  care management team member scheduled for: 11/04/23  145 pm  Wanda Diver RN, BSN, [facility] [place]  [facility], [facility]    Care Coordinator Phone: [phone]  Following is a copy of your plan of care:  There are no care plans that you recently  modified to display for this patient.

## 2023-10-06 ENCOUNTER — Other Ambulatory Visit: Payer: Self-pay

## 2023-10-06 ENCOUNTER — Telehealth: Payer: Self-pay | Admitting: *Deleted

## 2023-10-06 DIAGNOSIS — M25561 Pain in right knee: Secondary | ICD-10-CM

## 2023-10-06 DIAGNOSIS — M79604 Pain in right leg: Secondary | ICD-10-CM

## 2023-10-06 DIAGNOSIS — M19011 Primary osteoarthritis, right shoulder: Secondary | ICD-10-CM

## 2023-10-06 DIAGNOSIS — M2550 Pain in unspecified joint: Secondary | ICD-10-CM

## 2023-10-06 NOTE — Progress Notes (Signed)
 Complex Care Management Care Guide Note  10/06/2023 Name: [name] MRN: [id number] DOB: [birth year]  [name] is a 63 y.o. year old female who is a primary care patient of Oley Bascom RAMAN, NP and is actively engaged with the care management team. I reached out to [name] by phone today to assist with scheduling  with the BSW.  Follow up plan: Telephone appointment with complex care management team member scheduled for:  6/30  Harlene Satterfield  [facility]  [facility], [facility] Guide  Direct Dial: [phone]  Fax [phone]

## 2023-10-08 ENCOUNTER — Other Ambulatory Visit: Payer: Self-pay

## 2023-10-11 ENCOUNTER — Other Ambulatory Visit: Payer: Self-pay

## 2023-10-11 NOTE — Patient Outreach (Signed)
 Complex Care Management   Visit Note  10/11/2023  Name:  Wanda Weaver MRN: [id number] DOB: July 06, 1960  Situation: Referral received for Complex Care Management related to SDOH Barriers:  Housing problems paying her PD utility bill with [employer]. Up for disconnection on 10/22/2023 I obtained verbal consent from Patient.  Visit completed with patient  on the phone  Background:   Past Medical History:  Diagnosis Date   Bilateral lower extremity edema 07/2019   Bilateral lower extremity pain 07/2019   Diabetes mellitus without complication (HCC)    Hemoglobin A1C greater than 9%, indicating poor diabetic control    Hyperlipidemia    Hypertension    Vitamin D  deficiency     Assessment: SW called patient over the phone today for initial call. Patient was alert and cognitive. SDOH needs were assessed and the following were identified: utility assistance with PD [employer] bill that is up for disconnection 07/11. Patient has a PD bill up for disconnection with [employer] with a total balance of $1,163.94. Patient states [phone] needs to be paid by 07/11 to avoid disconnection. BSW will call DSS crisis intervention program, [place] [facility] for possible assistance. In addition, BSW will inquire about [facility] Patient Fund through [facility] for any possible assistance. Patient understood and agreed and was appreciative for efforts to help.   SDOH Interventions    Flowsheet Row Patient Outreach Telephone from 10/11/2023 in [place] POPULATION HEALTH DEPARTMENT Patient Outreach Telephone from 10/05/2023 in [place] POPULATION HEALTH DEPARTMENT Telephone from 09/02/2023 in [place] POPULATION HEALTH DEPARTMENT Patient Outreach Telephone from 09/01/2023 in [place] POPULATION HEALTH DEPARTMENT Care Coordination from 07/29/2023 in [place] POPULATION HEALTH DEPARTMENT Patient Outreach Telephone from 06/28/2023 in [place] POPULATION HEALTH DEPARTMENT  SDOH Interventions        Food Insecurity  Interventions -- Intervention Not Indicated Community Resources Provided, [id number] Referral Intervention Not Indicated Intervention Not Indicated Intervention Not Indicated  Housing Interventions -- Intervention Not Indicated -- Intervention Not Indicated -- --  Transportation Interventions Intervention Not Indicated -- -- Intervention Not Indicated Intervention Not Indicated --  Utilities Interventions Community Resources Provided --  [BSW] Community Resources Provided, [id number] Referral Other (Comment)  [BSW] Intervention Not Indicated --  Financial Strain Interventions -- -- Programmer, applications Provided, [id number] Referral -- -- --  Health Literacy Interventions -- -- -- -- -- Intervention Not Indicated      Recommendation:   Follow-up with local utility assistance programs for possible emergency assistance.   Follow Up Plan:   Telephone follow up appointment date/time:  10/13/2023 at 4:15pm  Laymon Doll, BSW [place]/VBCI - [facility] Social Worker [phone]

## 2023-10-11 NOTE — Patient Instructions (Signed)
 Visit Information  [name] was given information about Medicaid Managed Care team care coordination services as a part of their [facility] Community Plan Medicaid benefit. Wanda Weaver verbally consented to engagement with the [facility] Managed Care team.   If you are experiencing a medical emergency, please call 911 or report to your local emergency department or urgent care.   If you have a non-emergency medical problem during routine business hours, please contact your provider's office and ask to speak with a nurse.   For questions related to your [facility], please call: [phone] or visit the homepage here: [email or web address]  If you would like to schedule transportation through your [facility], please call the following number at least 2 days in advance of your appointment: [phone]   Rides for urgent appointments can also be made after hours by calling Member Services.  Call the Behavioral Health Crisis Line at [phone], at any time, 24 hours a day, 7 days a week. If you are in danger or need immediate medical attention call 911.  If you would like help to quit smoking, call 1-800-QUIT-NOW (385-064-1673) OR Espaol: 1-855-Djelo-Ya (8-144-664-6430) o para ms informacin haga clic aqu or Text READY to 799-599 to register via text  Wanda Weaver - following are the goals we discussed in your visit today:   Goals Addressed             This Visit's Progress    VBCI Social Work Care Plan   On track    Problems:   Housing (utility assistance). [employer] bill up for disconnection on 07/11.   CSW Clinical Goal(s):   Over the next 2 weeks the Patient will work with Child psychotherapist to address concerns related to possible utility assistance.  Interventions:  SW will call DSS regarding crisis intervention program, [employer], and consult with  leadership about  Patient Goals/Self-Care Activities:  None  Plan:   Telephone follow up appointment with care management team member scheduled for:  10/13/2023 at 4:15pm        Patient verbalizes understanding of instructions and care plan provided today and agrees to view in MyChart. Active MyChart status and patient understanding of how to access instructions and care plan via MyChart confirmed with patient.     Telephone follow up appointment with Managed Medicaid care management team member scheduled for: 10/13/2023 at 4:15pm  Laymon Doll, [place]/VBCI - [facility] Social Worker [phone]   Following is a copy of your plan of care:  There are no care plans that you recently modified to display for this patient.

## 2023-10-12 ENCOUNTER — Other Ambulatory Visit: Payer: Self-pay | Admitting: Nurse Practitioner

## 2023-10-12 ENCOUNTER — Other Ambulatory Visit: Payer: Self-pay

## 2023-10-13 ENCOUNTER — Other Ambulatory Visit: Payer: Self-pay

## 2023-10-13 LAB — CBC
Hematocrit: 35.1 % (ref 34.0–46.6)
Hemoglobin: 11.1 g/dL (ref 11.1–15.9)
MCH: 26.7 pg (ref 26.6–33.0)
MCHC: 31.6 g/dL (ref 31.5–35.7)
MCV: 85 fL (ref 79–97)
Platelets: 207 10*3/uL (ref 150–450)
RBC: 4.15 x10E6/uL (ref 3.77–5.28)
RDW: 13.4 % (ref 11.7–15.4)
WBC: 5.4 10*3/uL (ref 3.4–10.8)

## 2023-10-13 LAB — COMPREHENSIVE METABOLIC PANEL WITH GFR
ALT: 17 IU/L (ref 0–32)
AST: 21 IU/L (ref 0–40)
Albumin: 4.4 g/dL (ref 3.9–4.9)
Alkaline Phosphatase: 81 IU/L (ref 44–121)
BUN/Creatinine Ratio: 20 (ref 12–28)
BUN: 26 mg/dL (ref 8–27)
Bilirubin Total: 0.5 mg/dL (ref 0.0–1.2)
CO2: 23 mmol/L (ref 20–29)
Calcium: 9.7 mg/dL (ref 8.7–10.3)
Chloride: 97 mmol/L (ref 96–106)
Creatinine, Ser: 1.31 mg/dL — ABNORMAL HIGH (ref 0.57–1.00)
Globulin, Total: 2.7 g/dL (ref 1.5–4.5)
Glucose: 122 mg/dL — ABNORMAL HIGH (ref 70–99)
Potassium: 4.4 mmol/L (ref 3.5–5.2)
Sodium: 136 mmol/L (ref 134–144)
Total Protein: 7.1 g/dL (ref 6.0–8.5)
eGFR: 46 mL/min/{1.73_m2} — ABNORMAL LOW (ref >59)

## 2023-10-13 NOTE — Patient Instructions (Signed)
 Visit Information  Ms. Codrington was given information about Medicaid Managed Care team care coordination services as a part of their [facility] Community Plan Medicaid benefit. [name] verbally consented to engagement with the [facility] Managed Care team.   If you are experiencing a medical emergency, please call 911 or report to your local emergency department or urgent care.   If you have a non-emergency medical problem during routine business hours, please contact your provider's office and ask to speak with a nurse.   For questions related to your [facility], please call: [phone] or visit the homepage here: [email or web address]  If you would like to schedule transportation through your [facility], please call the following number at least 2 days in advance of your appointment: [phone]   Rides for urgent appointments can also be made after hours by calling Member Services.  Call the Behavioral Health Crisis Line at [phone], at any time, 24 hours a day, 7 days a week. If you are in danger or need immediate medical attention call 911.  If you would like help to quit smoking, call 1-800-QUIT-NOW ((770)061-6649) OR Espaol: 1-855-Djelo-Ya (8-144-664-6430) o para ms informacin haga clic aqu or Text READY to 799-599 to register via text  Ms. Dudzinski - following are the goals we discussed in your visit today:   Goals Addressed             This Visit's Progress    BSW VBCI Social Work Care Plan       Problems:   Housing (utility assistance). [employer] bill up for disconnection on 07/11.   CSW Clinical Goal(s):   Over the next 2 weeks the Patient will work with Child psychotherapist to address concerns related to possible utility assistance.  Interventions:  SW will call DSS regarding crisis intervention program, [employer], and consult with leadership  about  Patient Goals/Self-Care Activities:  None  Plan:   Telephone follow up appointment with care management team member scheduled for:  10/13/2023 at 4:15pm         Patient verbalizes understanding of instructions and care plan provided today and agrees to view in MyChart. Active MyChart status and patient understanding of how to access instructions and care plan via MyChart confirmed with patient.     Telephone follow up appointment with Managed Medicaid care management team member scheduled for: 10/19/2023 at 2:15pm  Laymon Doll, [place]/VBCI - [facility] Social Worker [phone]   Following is a copy of your plan of care:  There are no care plans that you recently modified to display for this patient.

## 2023-10-13 NOTE — Patient Outreach (Signed)
 Complex Care Management   Visit Note  10/13/2023  Name:  Wanda Weaver MRN: [id number] DOB: Sep 05, 1960  Situation: Referral received for Complex Care Management related to SDOH Barriers:  Lack of essential utilities [employer] bill up for disconnection 10/22/23. I obtained verbal consent from Patient.  Visit completed with patient  on the phone  Background:   Past Medical History:  Diagnosis Date   Bilateral lower extremity edema 07/2019   Bilateral lower extremity pain 07/2019   Diabetes mellitus without complication (HCC)    Hemoglobin A1C greater than 9%, indicating poor diabetic control    Hyperlipidemia    Hypertension    Vitamin D  deficiency     Assessment: SW held follow-up call with patient. Patient informed SW that she was told by her SSI caseworker that her case was transferred to the local SSA office [phone]) on [facility] and was given a call to follow-up on her case. BSW will attempt to call. Patient agreed.   BSW requested patient to email him her past due [employer] bill to begin application for [employer] assistance. BSW ensured patient understood that there is no guarantee the fund will be able to provide assistance and unaware of the processing time. Patient understood and stated she is worried about disconnection on 07/11 since she has medication that needs to be in the fridge. Patient agreed to provide [employer] bill soon via email provided.   SDOH Interventions    Flowsheet Row Patient Outreach Telephone from 10/11/2023 in [place] POPULATION HEALTH DEPARTMENT Patient Outreach Telephone from 10/05/2023 in [place] POPULATION HEALTH DEPARTMENT Telephone from 09/02/2023 in [place] POPULATION HEALTH DEPARTMENT Patient Outreach Telephone from 09/01/2023 in [place] POPULATION HEALTH DEPARTMENT Care Coordination from 07/29/2023 in [place] POPULATION HEALTH DEPARTMENT Patient Outreach Telephone from 06/28/2023 in [place] POPULATION  HEALTH DEPARTMENT  SDOH Interventions        Food Insecurity Interventions -- Intervention Not Indicated Community Resources Provided, [id number] Referral Intervention Not Indicated Intervention Not Indicated Intervention Not Indicated  Housing Interventions -- Intervention Not Indicated -- Intervention Not Indicated -- --  Transportation Interventions Intervention Not Indicated -- -- Intervention Not Indicated Intervention Not Indicated --  Utilities Interventions Community Resources Provided --  [BSW] Community Resources Provided, [id number] Referral Other (Comment)  [BSW] Intervention Not Indicated --  Financial Strain Interventions -- -- Programmer, applications Provided, [id number] Referral -- -- --  Health Literacy Interventions -- -- -- -- -- Intervention Not Indicated      Recommendation:   Apply for [facility] Patient Fund assistance through [employer].  Follow Up Plan:   Telephone follow up appointment date/time:  10/19/2023  Laymon Doll, BSW [place]/VBCI - [facility] Social Worker [phone]

## 2023-10-14 ENCOUNTER — Telehealth: Payer: Self-pay

## 2023-10-14 ENCOUNTER — Other Ambulatory Visit: Payer: Self-pay

## 2023-10-14 NOTE — Telephone Encounter (Signed)
 Copied from CRM [id number]. Topic: Clinical - Lab/Test Results >> Oct 14, 2023 11:15 AM Wanda Weaver wrote: Reason for CRM: Patient is requesting nurse call back regarding her recent lab results, please reach out # [phone]  Pt was advised that provider will send results notes. KH

## 2023-10-17 ENCOUNTER — Ambulatory Visit: Payer: Self-pay | Admitting: Nurse Practitioner

## 2023-10-18 ENCOUNTER — Other Ambulatory Visit: Payer: Self-pay | Admitting: Neurology

## 2023-10-18 ENCOUNTER — Other Ambulatory Visit: Payer: Self-pay

## 2023-10-18 DIAGNOSIS — R202 Paresthesia of skin: Secondary | ICD-10-CM

## 2023-10-18 DIAGNOSIS — R269 Unspecified abnormalities of gait and mobility: Secondary | ICD-10-CM

## 2023-10-19 ENCOUNTER — Other Ambulatory Visit: Payer: Self-pay

## 2023-10-19 NOTE — Patient Instructions (Signed)
 Visit Information  Ms. Persons was given information about Medicaid Managed Care team care coordination services as a part of their [facility] Community Plan Medicaid benefit. [name] verbally consented to engagement with the [facility] Managed Care team.   If you are experiencing a medical emergency, please call 911 or report to your local emergency department or urgent care.   If you have a non-emergency medical problem during routine business hours, please contact your provider's office and ask to speak with a nurse.   For questions related to your [facility], please call: [phone] or visit the homepage here: [email or web address]  If you would like to schedule transportation through your [facility], please call the following number at least 2 days in advance of your appointment: [phone]   Rides for urgent appointments can also be made after hours by calling Member Services.  Call the Behavioral Health Crisis Line at [phone], at any time, 24 hours a day, 7 days a week. If you are in danger or need immediate medical attention call 911.  If you would like help to quit smoking, call 1-800-QUIT-NOW (843-711-7015) OR Espaol: 1-855-Djelo-Ya (8-144-664-6430) o para ms informacin haga clic aqu or Text READY to 799-599 to register via text  Ms. Bialecki - following are the goals we discussed in your visit today:   Goals Addressed             This Visit's Progress    BSW VBCI Social Work Care Plan   On track    Problems:   Housing (utility assistance). [employer] bill up for disconnection on 07/11.   CSW Clinical Goal(s):   Over the next 2 weeks the Patient will work with Child psychotherapist to address concerns related to possible utility assistance.  Interventions:  SW will call DSS regarding crisis intervention program, [employer], and consult with  leadership about  Patient Goals/Self-Care Activities:  None  Plan:   Telephone follow up appointment with care management team member scheduled for:  10/13/2023 at 4:15pm          Patient verbalizes understanding of instructions and care plan provided today and agrees to view in MyChart. Active MyChart status and patient understanding of how to access instructions and care plan via MyChart confirmed with patient.     Telephone follow up appointment with Managed Medicaid care management team member scheduled for: 10/27/2023 at 11AM.   Laymon Doll, BSW [place]/VBCI - [facility] Social Worker [phone]   Following is a copy of your plan of care:  There are no care plans that you recently modified to display for this patient.

## 2023-10-19 NOTE — Patient Outreach (Addendum)
 Complex Care Management   Visit Note  10/19/2023  Name:  Wanda Weaver MRN: [id number] DOB: 07/13/1960  Situation: Referral received for Complex Care Management related to SDOH Barriers:  Lack of essential utilities [employer] disconnection notice for 10/22/2023 I obtained verbal consent from Patient.  Visit completed with patient  on the phone  Background:   Past Medical History:  Diagnosis Date   Bilateral lower extremity edema 07/2019   Bilateral lower extremity pain 07/2019   Diabetes mellitus without complication (HCC)    Hemoglobin A1C greater than 9%, indicating poor diabetic control    Hyperlipidemia    Hypertension    Vitamin D  deficiency     Assessment: BSW conducted follow-up call with patient. Patient was alert and cognitive. BSW confirmed with patient that he received [employer] Disconnection notice via email from patients daughter [place]. BSW and Patient discussed her sustainability plan to continue paying her [employer] bill if she is granted assistance from the [facility] Patient Fund through [employer]. Patient agreed to acknowledgement form and a virtual signature was verbally authorized by patient. BSW agreed to follow-up with patient about her application as he receives any updates. Patient understood and agreed.   Patient also confirmed she spoke with  [facility] representative and was able to get an updated insurance card with her current PCP info. In addition, her pharmacy issue was resolved and is able to get her insulin  needles covered by Medicaid. Patient has plans to pick those up today after boyfriend is off work.   SDOH Interventions    Flowsheet Row Patient Outreach Telephone from 10/11/2023 in [place] POPULATION HEALTH DEPARTMENT Patient Outreach Telephone from 10/05/2023 in [place] POPULATION HEALTH DEPARTMENT Telephone from 09/02/2023 in [place] POPULATION HEALTH DEPARTMENT Patient Outreach Telephone from 09/01/2023 in [place] POPULATION HEALTH DEPARTMENT  Care Coordination from 07/29/2023 in [place] POPULATION HEALTH DEPARTMENT Patient Outreach Telephone from 06/28/2023 in [place] POPULATION HEALTH DEPARTMENT  SDOH Interventions        Food Insecurity Interventions -- Intervention Not Indicated Community Resources Provided, [id number] Referral Intervention Not Indicated Intervention Not Indicated Intervention Not Indicated  Housing Interventions -- Intervention Not Indicated -- Intervention Not Indicated -- --  Transportation Interventions Intervention Not Indicated -- -- Intervention Not Indicated Intervention Not Indicated --  Utilities Interventions Community Resources Provided --  [BSW] Community Resources Provided, [id number] Referral Other (Comment)  [BSW] Intervention Not Indicated --  Financial Strain Interventions -- -- Programmer, applications Provided, [id number] Referral -- -- --  Health Literacy Interventions -- -- -- -- -- Intervention Not Indicated      Recommendation:   None  Follow Up Plan:   Telephone follow up appointment date/time:  10/27/2023 at 11AM  Laymon Doll, BSW [place]/VBCI - [facility] Social Worker [phone]

## 2023-10-20 NOTE — Patient Outreach (Signed)
 Complex Care Management   Visit Note  10/20/2023  Name:  Wanda Weaver MRN: [id number] DOB: 15-Feb-1961  Situation: Referral received for Complex Care Management related to SDOH Barriers:  utility assistance I obtained verbal consent from Patient.  Visit completed with patient  on the phone  Background:   Past Medical History:  Diagnosis Date   Bilateral lower extremity edema 07/2019   Bilateral lower extremity pain 07/2019   Diabetes mellitus without complication (HCC)    Hemoglobin A1C greater than 9%, indicating poor diabetic control    Hyperlipidemia    Hypertension    Vitamin D  deficiency     Assessment: BSW received voicemail from patient stating she was able to pay her disconnection amount with [employer] to avoid service interruption.   BSW called patient back and confirm information left on VM. Patient states she was able to pay the disconnection amount. However, patient is still has a past due balance, but no longer up for disconnection. BSW offered patient to continue to move forward with [facility] Patient Assistance Fund to attempt to cover the remaining past due balance. However, Patient declined and requested for her application to be withdrawn for assistance. Patient was asked if she needed additional resources to other needs and patient confirmed she was good and did not need anything else. Patient was appreciative of efforts and help. BSW advised patient to reach out to PCP for referral if she needed to seek BSW services again. Patient understood and agreed for case closure.   SDOH Interventions    Flowsheet Row Documentation from 10/20/2023 in [place] POPULATION HEALTH DEPARTMENT Patient Outreach Telephone from 10/11/2023 in [place] POPULATION HEALTH DEPARTMENT Patient Outreach Telephone from 10/05/2023 in [place] POPULATION HEALTH DEPARTMENT Telephone from 09/02/2023 in [place] POPULATION HEALTH DEPARTMENT Patient Outreach Telephone from 09/01/2023 in [place]  POPULATION HEALTH DEPARTMENT Care Coordination from 07/29/2023 in [place] POPULATION HEALTH DEPARTMENT  SDOH Interventions        Food Insecurity Interventions -- -- Intervention Not Indicated Community Resources Provided, [id number] Referral Intervention Not Indicated Intervention Not Indicated  Housing Interventions -- -- Intervention Not Indicated -- Intervention Not Indicated --  Transportation Interventions -- Intervention Not Indicated -- -- Intervention Not Indicated Intervention Not Indicated  Utilities Interventions Intervention Not Indicated Community Resources Provided --  [BSW] Community Resources Provided, [id number] Referral Other (Comment)  [BSW] Intervention Not Indicated  Financial Strain Interventions -- -- -- Programmer, applications Provided, [id number] Referral -- --      Recommendation:   None  Follow Up Plan:   Patient has met all care management goals. Care Management case will be closed. Patient has been provided contact information should new needs arise.   Laymon Doll, BSW /VBCI - [employer] Social Worker [phone]

## 2023-10-22 ENCOUNTER — Other Ambulatory Visit: Payer: Self-pay

## 2023-10-27 ENCOUNTER — Telehealth

## 2023-10-27 ENCOUNTER — Other Ambulatory Visit (HOSPITAL_COMMUNITY): Payer: Self-pay

## 2023-10-27 ENCOUNTER — Other Ambulatory Visit: Payer: Self-pay

## 2023-10-30 ENCOUNTER — Other Ambulatory Visit: Payer: Self-pay | Admitting: Nurse Practitioner

## 2023-11-01 ENCOUNTER — Other Ambulatory Visit: Payer: Self-pay

## 2023-11-01 ENCOUNTER — Telehealth: Payer: Self-pay

## 2023-11-01 MED ORDER — BLOOD GLUCOSE MONITOR KIT
1.0000 | PACK | Freq: Four times a day (QID) | 0 refills | Status: AC
  Filled 2023-11-01 – 2023-11-04 (×2): qty 1, 30d supply, fill #0

## 2023-11-01 MED ORDER — ACCU-CHEK SOFTCLIX LANCETS MISC
12 refills | Status: AC
  Filled 2023-11-01: qty 200, 90d supply, fill #0

## 2023-11-01 NOTE — Telephone Encounter (Signed)
 Copied from CRM [id number]. Topic: Referral - Prior Authorization Question >> Nov 01, 2023 12:09 PM Charlet HERO wrote: Reason for CRM: shikera s with member services with [employer] Medicaid needs to get auth sent in for patient the blood glucose meter kit and supplies KIT the patients has went  out and will need for the same device accu- check.  Pharmacy was called. Pt can not pick up a new meter due to insurance coverage. Supplies were sent in and pt will be called. KH

## 2023-11-03 ENCOUNTER — Other Ambulatory Visit (HOSPITAL_COMMUNITY): Payer: Self-pay

## 2023-11-03 ENCOUNTER — Other Ambulatory Visit: Payer: Self-pay

## 2023-11-04 ENCOUNTER — Other Ambulatory Visit: Payer: Self-pay

## 2023-11-04 NOTE — Patient Outreach (Signed)
 Complex Care Management   Visit Note  11/04/2023  Name:  Wanda Weaver MRN: [id number] DOB: 12/12/1960  Situation: Referral received for Complex Care Management related to Diabetes with Complications and Sarcoidosis I obtained verbal consent from Patient.  Visit completed with patient  on the phone  Background:   Past Medical History:  Diagnosis Date   Bilateral lower extremity edema 07/2019   Bilateral lower extremity pain 07/2019   Diabetes mellitus without complication (HCC)    Hemoglobin A1C greater than 9%, indicating poor diabetic control    Hyperlipidemia    Hypertension    Vitamin D  deficiency     Assessment:  I spoke with Wanda Weaver today, and she reported that she is doing well. She has been taking her medications, and her breathing has been stable. Ms Weaver has managed to stay out of the heat, and her diet has changed significantly since she started taking Trulicity , which has helped her cut back on unhealthy foods. She has stopped eating a lot of fast food, as well as snacks like potato chips and candy, and she is no longer drinking soft drinks. Ms Weaver confirmed that she is sticking to her medication regimen and mentioned that she has an appointment with the pulmonologist on August 4th to check on her breathing again. Patient Reported Symptoms:  Cognitive Cognitive Status: Able to follow simple commands, Alert and oriented to person, place, and time, Normal speech and language skills      Neurological Neurological Review of Symptoms: No symptoms reported    HEENT HEENT Symptoms Reported: No symptoms reported      Cardiovascular Cardiovascular Symptoms Reported: No symptoms reported Does patient have uncontrolled Hypertension?: No    Respiratory Respiratory Symptoms Reported: Shortness of breath Additional Respiratory Details: with exertion Respiratory Management Strategies: Medication therapy  Endocrine Is patient diabetic?: Yes List most recent blood sugar readings,  include date and time of day: 134 blood sugar was 134    Gastrointestinal Gastrointestinal Symptoms Reported: No symptoms reported      Genitourinary Genitourinary Symptoms Reported: No symptoms reported    Integumentary Integumentary Symptoms Reported: No symptoms reported    Musculoskeletal Musculoskelatal Symptoms Reviewed: Back pain, Difficulty walking, Joint pain, Weakness Additional Musculoskeletal Details: patient says she feels like she has a 'charlie horse in her leg and leaves Musculoskeletal Management Strategies: Medical device, Medication therapy Falls in the past year?: No    Psychosocial       Quality of Family Relationships: supportive Do you feel physically threatened by others?: No      11/04/2023    2:14 PM  Depression screen PHQ 2/9  Decreased Interest 0  Down, Depressed, Hopeless 0  PHQ - 2 Score 0    There were no vitals filed for this visit.  Medications Reviewed Today     Reviewed by Weyman Corning, RN (Registered Nurse) on 11/04/23 at 1400  Med List Status: <None>   Medication Order Taking? Sig Documenting Provider Last Dose Status Informant  Accu-Chek Softclix Lancets lancets [id number]  Use as instructed Oley Bascom RAMAN, NP  Active   albuterol  (VENTOLIN  HFA) 108 (90 Base) MCG/ACT inhaler [id number] Yes Inhale 2 puffs into the lungs every 6 (six) hours as needed. Oley Bascom RAMAN, NP  Active   blood glucose meter kit and supplies KIT [id number]  Use up to four times daily as directed. Oley Bascom RAMAN, NP  Active   Dulaglutide  (TRULICITY ) 0.75 MG/0.5ML [name] [id number] Yes Inject 0.75 mg into the skin once a week.  [name], [name], FNP  Active   Elastic Bandages & Supports (KNEE BRACE ADJUSTABLE HINGED) MISC [id number] Yes 1 application by Does not apply route daily. [name], NP  Active            Med Note MAYER, [facility] R   Thu Jun 10, 2023 11:19 AM) Supply  escitalopram  (LEXAPRO ) 10 MG tablet [id number] Yes Take 1 tablet (10 mg total) by  mouth daily. Oley Bascom RAMAN, NP  Active   fluticasone -salmeterol (ADVAIR  HFA) 115-21 MCG/ACT inhaler [id number]  Inhale 2 puffs into the lungs 2 (two) times daily.  Patient taking differently: Inhale 2 puffs into the lungs 2 (two) times daily. Uses as needed   [name], MD  Active   folic acid  (FOLVITE ) 1 MG tablet [id number] Yes Take 1 tablet (1 mg total) by mouth daily.   Active   glucose blood test strip [id number] Yes Use as instructed [name], [name], NP  Active            Med Note MAYER, [facility] R   Thu Jun 10, 2023 11:19 AM) supply  hydrochlorothiazide  (HYDRODIURIL ) 25 MG tablet [id number] Yes Take 1 tablet (25 mg total) by mouth daily. [name], [name], NP  Active   insulin  glargine (LANTUS ) 100 UNIT/ML injection [id number] Yes Inject 0.34 mLs (34 Units total) into the skin daily. Oley Bascom RAMAN, NP  Active   Insulin  Syringe-Needle U-100 (INSULIN  SYRINGE .5CC/31GX5/16) 31G X 5/16 0.5 ML MISC [id number]  Use to inject insulin  daily [name], [name], NP  Active            Med Note MAYER, [facility] R   Thu Jun 10, 2023 11:21 AM) Supply  losartan  (COZAAR ) 50 MG tablet [id number]  Take 1 tablet (50 mg total) by mouth daily. [name], [name], FNP  Active   metFORMIN  (GLUCOPHAGE ) 500 MG tablet [id number] Yes Take 1 tablet (500 mg total) by mouth 2 (two) times daily with a meal. [name], [name], FNP  Active   methotrexate  (RHEUMATREX) 2.5 MG tablet [id number] Yes Take 4 tablets (10 mg total) by mouth once a week for 30 days, THEN 6 tablets (15 mg total) once a week.   Active   metoprolol  succinate (TOPROL -XL) 50 MG 24 hr tablet [id number] Yes Take 1 tablet (50 mg total) by mouth daily. Take with or immediately following a meal. Oley Bascom RAMAN, NP  Active   ondansetron  (ZOFRAN ) 4 MG tablet [id number] Yes Take 1 tablet (4 mg total) by mouth every 8 (eight) hours as needed for nausea or vomiting. [name], [name], FNP  Active            Med Note MAYER, [name] R   Thu Jun 10, 2023  11:20 AM) Taking PRN  rosuvastatin  (CRESTOR ) 40 MG tablet [id number] Yes Take 1 tablet (40 mg total) by mouth daily. Oley Bascom RAMAN, NP  Active   Spacer/Aero-Holding [facility] DIAMOND -RUTHER LAVELLE FRENCH [id number]  Dispense 1 [name], MD  Active             Recommendation:   Continue Current Plan of Care  Follow Up Plan:   Telephone follow up appointment with care management team member scheduled for:  11/17/23 2 pm with Olam Ku RNCM  Wilbert Diver RN, BSN, [facility]  [facility], [facility]    Care Coordinator Phone: [phone]

## 2023-11-04 NOTE — Patient Instructions (Signed)
 Visit Information  [name] was given information about Medicaid Managed Care team care coordination services as a part of their [facility] Community Plan Medicaid benefit. Wanda Weaver verbally consented to engagement with the [facility] Managed Care team.   If you are experiencing a medical emergency, please call 911 or report to your local emergency department or urgent care.   If you have a non-emergency medical problem during routine business hours, please contact your provider's office and ask to speak with a nurse.   For questions related to your [facility], please call: [phone] or visit the homepage here: [email or web address]  If you would like to schedule transportation through your [facility], please call the following number at least 2 days in advance of your appointment: [phone]   Rides for urgent appointments can also be made after hours by calling Member Services.  Call the Behavioral Health Crisis Line at [phone], at any time, 24 hours a day, 7 days a week. If you are in danger or need immediate medical attention call 911.  If you would like help to quit smoking, call 1-800-QUIT-NOW (847-342-8693) OR Espaol: 1-855-Djelo-Ya (8-144-664-6430) o para ms informacin haga clic aqu or Text READY to 799-599 to register via text  Wanda Weaver - following are the goals we discussed in your visit today:   Goals Addressed             This Visit's Progress    VBCI RN Care Plan       Problems:  Chronic Disease Management support and education needs related to DMII  Goal: Over the next 30 days the Patient will attend all scheduled medical appointments: with PCP and specialist as evidenced by keeping al scheduled appointments        demonstrate Ongoing adherence to prescribed treatment plan for DM II as evidenced by no admissions to the hospital verbalize basic  understanding of DM II disease process and self health management plan as evidenced by verbal explanation lifestyle changes and consistent medication compliance   Interventions:   Diabetes Interventions: Assessed patient's understanding of A1c goal: <7% Reviewed medications with patient and discussed importance of medication adherence Counseled on importance of regular laboratory monitoring as prescribed Discussed plans with patient for ongoing care management follow up and provided patient with direct contact information for care management team Review of patient status, including review of consultants reports, relevant laboratory and other test results, and medications completed Screening for signs and symptoms of depression related to chronic disease state  Assessed social determinant of health barriers The patient stated that she had a low of 54 and she was able to tell me the steps to reaise her blood sugar to 113.  She states that this has happened only once and it was when she was trying to move a char from against the wall. Lab Results  Component Value Date   HGBA1C 9.6 (A) 09/13/2023    Patient Self-Care Activities:  Attend all scheduled provider appointments Call pharmacy for medication refills 3-7 days in advance of running out of medications Call provider office for new concerns or questions  Take medications as prescribed   enter blood sugar readings and medication or insulin  into daily log take the blood sugar log to all doctor visits  Plan:  Telephone follow up appointment with care management team member scheduled for:  12/07/23  2 pm with Olam Ku [facility]          VBCI RN  Care Plan-Sarcoidosis       Problems:  Chronic Disease Management support and education needs related to Sarcoidosis  Goal: Over the next 30 days the Patient will attend all scheduled medical appointments: with PCP and specialist as evidenced by keeping al scheduled appointments     demonstrate Ongoing adherence to prescribed treatment plan for Sarcoidosis as evidenced by no admissions to the hospital verbalize basic understanding of Sarcoidosis disease process and self health management plan as evidenced by verbal explanation lifestyle changes and consistent medication compliance   Interventions:   Evaluation of current treatment plan related to Sarcoidosis,  self-management and patient's adherence to plan as established by provider. Discussed plans with patient for ongoing care management follow up and provided patient with direct contact information for care management team Evaluation of current treatment plan related to Sarcoidosis and patient's adherence to plan as established by provider Reviewed medications with patient and discussed  Reviewed scheduled/upcoming provider appointments Discussed plans with patient for ongoing care management follow up and provided patient with direct contact information for care management team Rest Take your medications Keep you appointment with the pulmonologist on 11/15/23 Stay out of the heat as much as possible  Patient Self-Care Activities:  Attend all scheduled provider appointments Call pharmacy for medication refills 3-7 days in advance of running out of medications Call provider office for new concerns or questions  Perform all self care activities independently  Take medications as prescribed   identify and remove indoor air pollutants begin a symptom diary develop a rescue plan practice relaxation or meditation daily  Plan:  Telephone follow up appointment with care management team member scheduled for:  11/17/23 2 pm with Olam Ku RNCM             Please see education materials related to DM II and Sarcordosis provided by MyChart link.  Patient verbalizes understanding of instructions and care plan provided today and agrees to view in MyChart. Active MyChart status and patient understanding of how to  access instructions and care plan via MyChart confirmed with patient.     Telephone follow up appointment with care management team member scheduled for:  11/17/23 2 pm with Olam Ku RNCM    Wilbert Diver RN, BSN, [facility]  [facility], [facility]    Care Coordinator Phone: [phone]     Following is a copy of your plan of care:  There are no care plans that you recently modified to display for this patient.

## 2023-11-05 ENCOUNTER — Telehealth: Payer: Self-pay

## 2023-11-05 ENCOUNTER — Other Ambulatory Visit: Payer: Self-pay

## 2023-11-05 ENCOUNTER — Other Ambulatory Visit (HOSPITAL_COMMUNITY): Payer: Self-pay

## 2023-11-05 NOTE — Telephone Encounter (Signed)
 Copied from CRM [id number]. Topic: Clinical - Prescription Issue >> Nov 05, 2023 10:40 AM Treva T wrote: Reason for CRM: Received call from [facility] with [facility] Member services, calling on behalf of patient.   Per Ismael, reports per pharmacy, [facility], prescription for Accuchek Meter is not covered by patient's insurance plan, and another meter will need to be prescribed.   Per caller, please return call to provider line phone [phone], to discuss alternative options further.

## 2023-11-08 ENCOUNTER — Other Ambulatory Visit: Payer: Self-pay

## 2023-11-08 DIAGNOSIS — I10 Essential (primary) hypertension: Secondary | ICD-10-CM

## 2023-11-08 DIAGNOSIS — E118 Type 2 diabetes mellitus with unspecified complications: Secondary | ICD-10-CM

## 2023-11-08 MED ORDER — TRULICITY 0.75 MG/0.5ML ~~LOC~~ SOAJ
0.7500 mg | SUBCUTANEOUS | 3 refills | Status: AC
Start: 2023-11-08 — End: ?
  Filled 2023-11-08 – 2023-11-15 (×2): qty 2, 28d supply, fill #0
  Filled 2023-12-08: qty 2, 28d supply, fill #1
  Filled 2023-12-30: qty 2, 28d supply, fill #2

## 2023-11-08 MED ORDER — LOSARTAN POTASSIUM 25 MG PO TABS
25.0000 mg | ORAL_TABLET | Freq: Every day | ORAL | 3 refills | Status: AC
  Filled 2023-11-08 – 2023-11-28 (×2): qty 90, 90d supply, fill #0
  Filled 2024-02-25: qty 90, 90d supply, fill #1

## 2023-11-08 NOTE — Progress Notes (Signed)
 11/08/2023 Name: Wanda Weaver MRN: [id number] DOB: 05/03/1960  Chief Complaint  Patient presents with   Diabetes   Hypertension    Wanda Weaver is a 63 y.o. year old female who presented for a telephone visit.   They were referred to the pharmacist by their PCP for assistance in managing diabetes. PMH includes T2DM, HLD, HTN, pulmonary sarcoidosis.   Subjective: She was last seen by her PCP, Wanda Borer, NP on 09/13/23. BP at that visit was 158/61 mmHg. Patient reported that she was told by neurology to stop metformin , but BG increased > 500 mg/dL and she put herself back on metformin . Patient also reported that Advair , prescribed by pulmonology, increased her BG. At last pharmacy telephone appt on 09/20/23, patient was instructed to increase losartan  to 50 mg daily for elevated BP and start Trulicity  0.75 mg weekly.   Today, she reports doing ok. She is having some nausea the day after Trulicity  injection, but overall tolerating well. She also recently started methotrexate  for sarcoidosis. She takes this on Sundays and feels that is also contributes to her nausea. She takes the methotrexate  on Sundays.   Care Team: Primary Care Provider: Borer Wanda RAMAN, NP ; Next Scheduled Visit: 12/15/23  Medication Access/Adherence  Current Pharmacy:  [facility] [address], [address] [place] [address] Phone: [phone] Fax: [phone]  [facility] [address] [place] [address] Phone: [phone] Fax: [phone]   Patient reports affordability concerns with their medications: Yes  - not working since sarcoidosis dx Patient reports access/transportation concerns to their pharmacy: Yes  - Daughter has to take her to appt.  Patient reports adherence concerns with their medications:  No     Diabetes:  Current medications: Lantus  (vial) 34 units injected once daily, metformin  IR  500 mg BID, Trulicity  0.75 mg weekly (Wednesday) Medications tried in the past: Jardiance  - consistent burning/itching while urinating; Ozempic  - severe nausea/vomiting. Patient reports that insulin  pen needles are more painful for her to inject than the insulin  syringe/vial.   Reports that she has nausea  usually one day after her Trulicity  injection, but otherwise is generally tolerable.   Using Accu-Chek glucometer; testing 3 times daily - checks in the AM, noon, and PM  Reports that she bought a new Accu Chek meter at [facility] after she had a lot of back and forth with the pharmacy. Her meter was broken and could not get it covered by insurance at the pharmacy.  11/08/23: AM 139 mg/dL 2/72/74: AM 872 mg/dL 2/73/74: AM 876 mg/dL Highest since starting Trulicity  213 mg/dL.  Patient endorses hypoglycemic s/sx including dizziness, sweating and shakiness with BG of 51 mg/dL last week. She treated with regular soda, which increased it to 79 mg/dL. Has history of BG dropping to 27 mg/dL - almost lost consciousness. Had worked 14 hours that day, did not have time to eat. So she does have some fear of hypoglycemia.    Patient reports hyperglycemic symptoms including shakiness with BG of 130 yesterday at a family member's graduation.   polyuria, polydipsia, polyphagia, nocturia, neuropathy, blurred vision.  Current meal patterns: Reports that her appetite is very reduced with Trulicity . She is not eating snack foods, candy. Reports that she has started using protein shakes. She also bought a TV dinner with protein and was only able to eat half.  - Drinks - zero sugar juice, sugar free tea, water 4-5 glasses per day  Current physical activity: limited by ShOB - walks to mailbox and back daily, laundry at home. Has cane and walker. Activity is more limited than usual currently due to heat.  Hypertension:  Current medications: hydrochlorothiazide  25 mg daily, losartan  50 mg daily (reports she cut back  to 25 mg daily due to headaches), metoprolol  succinate 50 mg daily  Patient has a validated, automated, upper arm home BP cuff Current blood pressure readings readings: 132/60 mmHg last time, 129/59 mmHg yesterday  Patient denies hypotensive s/sx including dizziness, lightheadedness.  Patient denies hypertensive symptoms including headache, chest pain, shortness of breath   Hyperlipidemia/ASCVD Risk Reduction  Current lipid lowering medications: rosuvastatin  40 mg daily (has to split pill to swallow)  Clinical ASCVD: No  The 10-year ASCVD risk score (Arnett DK, et al., 2019) is: 17.7%   Values used to calculate the score:     Age: 33 years     Clincally relevant sex: Female     Is Non-Hispanic African American: No     Diabetic: Yes     Tobacco smoker: No     Systolic Blood Pressure: 158 mmHg     Is BP treated: Yes     HDL Cholesterol: 47 mg/dL     Total Cholesterol: 221 mg/dL    Objective:  BP Readings from Last 3 Encounters:  09/13/23 (!) 158/61  08/04/23 128/84  05/24/23 (!) 164/67     Lab Results  Component Value Date   HGBA1C 9.6 (A) 09/13/2023    Lab Results  Component Value Date   CREATININE 1.31 (H) 10/12/2023   BUN 26 10/12/2023   NA 136 10/12/2023   K 4.4 10/12/2023   CL 97 10/12/2023   CO2 23 10/12/2023    Lab Results  Component Value Date   CHOL 221 (H) 05/08/2022   HDL 47 05/08/2022   LDLCALC 150 (H) 05/08/2022   TRIG 131 05/08/2022   CHOLHDL 4.7 (H) 05/08/2022   UACR 09/13/23: 46 mg/g   eGFR 01/19/23 > 47 mL/min  Vitamin D3 07/01/23: < 4 ng/mL - Patient was told she should not take vitamin D  supplement with sarcoidosis.   Medications Reviewed Today     Reviewed by Wanda Weaver, RPH (Pharmacist) on 11/08/23 at 1540  Med List Status: <None>   Medication Order Taking? Sig Documenting Provider Last Dose Status Informant  Accu-Chek Softclix Lancets lancets [id number]  Use as instructed Oley Wanda RAMAN, NP  Active   albuterol  (VENTOLIN  HFA)  108 (90 Base) MCG/ACT inhaler [id number]  Inhale 2 puffs into the lungs every 6 (six) hours as needed. Oley Wanda RAMAN, NP  Active   blood glucose meter kit and supplies KIT [id number]  Use up to four times daily as directed. [name], [name], NP  Active   Dulaglutide  (TRULICITY ) 0.75 MG/0.5ML [name] [id number]  Inject 0.75 mg into the skin once a week. Oley Wanda RAMAN, NP  Active   Elastic Bandages & Supports (KNEE BRACE ADJUSTABLE HINGED) MISC [id number]  1 application by Does not apply route daily. [name], NP  Active            Med Note MAYER, [facility] R   Thu Jun 10, 2023 11:19 AM) Supply  escitalopram  (LEXAPRO ) 10 MG tablet [id number]  Take 1 tablet (10 mg total) by mouth daily. Oley Wanda RAMAN, NP  Active    Patient taking differently:   Discontinued 11/08/23 1539 (Patient Preference)   folic acid  (FOLVITE ) 1 MG tablet [id number]  Take 1 tablet (  1 mg total) by mouth daily.   Active   glucose blood test strip [id number]  Use as instructed Oley Wanda RAMAN, NP  Active            Med Note MAYER, [facility] R   Thu Jun 10, 2023 11:19 AM) supply  hydrochlorothiazide  (HYDRODIURIL ) 25 MG tablet [id number]  Take 1 tablet (25 mg total) by mouth daily. [name], [name], NP  Active   insulin  glargine (LANTUS ) 100 UNIT/ML injection [id number]  Inject 0.34 mLs (34 Units total) into the skin daily. Oley Wanda RAMAN, NP  Active   Insulin  Syringe-Needle U-100 (INSULIN  SYRINGE .5CC/31GX5/16) 31G X 5/16 0.5 ML MISC [id number]  Use to inject insulin  daily [name], [name], NP  Active            Med Note MAYER, [facility] R   Thu Jun 10, 2023 11:21 AM) Supply  losartan  (COZAAR ) 25 MG tablet [id number]  Take 1 tablet (25 mg total) by mouth daily. Oley Wanda RAMAN, NP  Active   metFORMIN  (GLUCOPHAGE ) 500 MG tablet [id number]  Take 1 tablet (500 mg total) by mouth 2 (two) times daily with a meal. [name], [name], FNP  Active   methotrexate  (RHEUMATREX) 2.5 MG tablet [id number]  Take 4 tablets (10 mg total) by mouth  once a week for 30 days, THEN 6 tablets (15 mg total) once a week.   Active   metoprolol  succinate (TOPROL -XL) 50 MG 24 hr tablet [id number]  Take 1 tablet (50 mg total) by mouth daily. Take with or immediately following a meal. Oley Wanda RAMAN, NP  Active   ondansetron  (ZOFRAN ) 4 MG tablet [id number]  Take 1 tablet (4 mg total) by mouth every 8 (eight) hours as needed for nausea or vomiting. [name], [name], FNP  Active            Med Note MAYER, [facility] R   Thu Jun 10, 2023 11:20 AM) Taking PRN  rosuvastatin  (CRESTOR ) 40 MG tablet [id number]  Take 1 tablet (40 mg total) by mouth daily. Oley Wanda RAMAN, NP  Active   Spacer/Aero-Holding Raguel [facility] DIAMOND -RUTHER MONTANE) DEVI [id number]  Dispense 1 [name], MD  Active             Assessment/Plan:   Diabetes: - Currently uncontrolled with last A1C of 9.6% above goal < 7%. Patient is a good candidate for a GLP-1RA with BMI > 35 and uncontrolled BG. She was able to successfully start Trulicity , despite severe GI reaction to Ozempic  in the past. She is having some nausea associated with the medication, but is overall tolerating well and glycemic control has improved per SMBG. She did have one episode of hypoglycemia, so we will dose-reduce her basal insulin  today. With her last eGFR of 47 mL/min, continue to recommend max metformin  dose of 1000 mg per day.  - Reviewed long term cardiovascular and renal outcomes of uncontrolled blood sugar - Reviewed goal A1c, goal fasting, and goal 2 hour post prandial glucose - Reviewed dietary modifications including eating small meals and snacks throughout the day, staying hydrated with water, and emphasized protein intake with each meal. Advised patient to not go long periods without eating, which could worsen nausea associated with GLP-1 and methotrexate .  - Reviewed lifestyle modifications including: increasing physical activity as able.  - Recommend to decrease Lantus  to 30 units daily  -  Recommend to continue metformin  to 1 tablet (500 mg) twice daily for reduced kidney function - Recommend to continue Trulicity   0.75 mg once weekly  - Patient denies personal or family history of multiple endocrine neoplasia type 2, medullary thyroid  cancer; personal history of pancreatitis or gallbladder disease. - Recommend to check glucose three times daily with glucometer. Patient reports she has been denied coverage for CGM in the past. Expect she would be approved now, but she would like to continue fingersticks.    Hypertension: - Currently controlled with home BP below goal < 130/80 mmHg.  UACR demonstrates microalbuminuria, but patient did not tolerate ARB dose increase due to HA. Appropriate to continue current regimen. - Reviewed long term cardiovascular and renal outcomes of uncontrolled blood pressure - Reviewed appropriate blood pressure monitoring technique and reviewed goal blood pressure. Recommended to check home blood pressure and heart rate once daily.  - Recommend to continue hydrochlorothiazide  25 mg daily, metoprolol  succinate 50 mg daily, losartan  25 mg daily   Hyperlipidemia/ASCVD Risk Reduction: - Currently uncontrolled with last LDL-C 150 mg/dL above goal < 70 mg/dL while adherent to high intensity statin. Will discuss options for add-on therapy at follow-up. Patient does have financial constraints despite $4 copay per medication.  - Reviewed long term complications of uncontrolled cholesterol - Recommend to continue rosuvastatin  40 mg daily - Recheck lipid panel at PCP appt in Sept  Follow Up Plan: PCP 12/15/23, Pharmacist telephone 12/22/23  Lorain Baseman, PharmD [facility] [phone]

## 2023-11-10 ENCOUNTER — Other Ambulatory Visit: Payer: Self-pay

## 2023-11-12 ENCOUNTER — Other Ambulatory Visit: Payer: Self-pay

## 2023-11-15 ENCOUNTER — Ambulatory Visit: Admitting: Pulmonary Disease

## 2023-11-15 ENCOUNTER — Other Ambulatory Visit: Payer: Self-pay

## 2023-11-15 ENCOUNTER — Encounter (HOSPITAL_BASED_OUTPATIENT_CLINIC_OR_DEPARTMENT_OTHER)

## 2023-11-15 DIAGNOSIS — R0602 Shortness of breath: Secondary | ICD-10-CM

## 2023-11-15 LAB — PULMONARY FUNCTION TEST
DL/VA % pred: 125 %
DL/VA: 5.34 ml/min/mmHg/L
DLCO cor % pred: 64 %
DLCO cor: 12.07 ml/min/mmHg
DLCO unc % pred: 59 %
DLCO unc: 11.12 ml/min/mmHg
FEF 25-75 Post: 1.43 L/s
FEF 25-75 Pre: 1.49 L/s
FEF2575-%Change-Post: -4 %
FEF2575-%Pred-Post: 66 %
FEF2575-%Pred-Pre: 69 %
FEV1-%Change-Post: 0 %
FEV1-%Pred-Post: 62 %
FEV1-%Pred-Pre: 62 %
FEV1-Post: 1.44 L
FEV1-Pre: 1.44 L
FEV1FVC-%Change-Post: -1 %
FEV1FVC-%Pred-Pre: 104 %
FEV6-%Change-Post: 2 %
FEV6-%Pred-Post: 62 %
FEV6-%Pred-Pre: 60 %
FEV6-Post: 1.8 L
FEV6-Pre: 1.76 L
FEV6FVC-%Pred-Post: 103 %
FEV6FVC-%Pred-Pre: 103 %
FVC-%Change-Post: 2 %
FVC-%Pred-Post: 59 %
FVC-%Pred-Pre: 58 %
FVC-Post: 1.8 L
FVC-Pre: 1.76 L
Post FEV1/FVC ratio: 80 %
Post FEV6/FVC ratio: 100 %
Pre FEV1/FVC ratio: 81 %
Pre FEV6/FVC Ratio: 100 %

## 2023-11-15 NOTE — Patient Instructions (Signed)
 Full pft attempted. Pre/post spiro and DLCO performed today

## 2023-11-15 NOTE — Progress Notes (Signed)
 Full pft attempted. Pre/post spiro and DLCO performed today

## 2023-11-16 ENCOUNTER — Other Ambulatory Visit: Payer: Self-pay

## 2023-11-17 ENCOUNTER — Other Ambulatory Visit: Payer: Self-pay

## 2023-11-18 ENCOUNTER — Ambulatory Visit (HOSPITAL_BASED_OUTPATIENT_CLINIC_OR_DEPARTMENT_OTHER): Payer: Self-pay | Admitting: Pulmonary Disease

## 2023-11-23 ENCOUNTER — Other Ambulatory Visit: Payer: Self-pay

## 2023-11-25 ENCOUNTER — Other Ambulatory Visit: Payer: Self-pay | Admitting: *Deleted

## 2023-11-25 NOTE — Patient Instructions (Signed)
 Visit Information  Thank you for taking time to visit with me today. Please don't hesitate to contact me if I can be of assistance to you before our next scheduled appointment.  Your next care management appointment is by telephone on 12/30/2023 at 1:30 pm   Please call the care guide team at [phone] if you need to cancel, schedule, or reschedule an appointment.   Please call the Suicide and Crisis Lifeline: 988 call the [place]  National Suicide Prevention Lifeline: 972-111-7249 or TTY: (986) 405-5305 TTY 718-356-7513) to talk to a trained counselor call 1-800-273-TALK (toll free, 24 hour hotline) if you are experiencing a Mental Health or Behavioral Health Crisis or need someone to talk to.   Olam Ku, RN, BSN [place]  [facility], [facility] RN Care Manager Direct Dial: [phone]  Fax: [phone]

## 2023-11-25 NOTE — Patient Outreach (Signed)
 Complex Care Management   Visit Note  11/25/2023  Name:  Wanda Weaver MRN: [id number] DOB: 29-Apr-1960  Situation: Referral received for Complex Care Management related to Sarcoidosis. I obtained verbal consent from Patient.  Visit completed with patient  on the phone  Background:   Past Medical History:  Diagnosis Date   Bilateral lower extremity edema 07/2019   Bilateral lower extremity pain 07/2019   Diabetes mellitus without complication (HCC)    Hemoglobin A1C greater than 9%, indicating poor diabetic control    Hyperlipidemia    Hypertension    Vitamin D deficiency     Assessment: Patient Reported Symptoms:  Cognitive Cognitive Status: Alert and oriented to person, place, and time, Normal speech and language skills, Able to follow simple commands   Health Maintenance Behaviors: Annual physical exam Healing Pattern: Average  Neurological Neurological Review of Symptoms: No symptoms reported    HEENT HEENT Symptoms Reported: No symptoms reported      Cardiovascular Cardiovascular Symptoms Reported: No symptoms reported Does patient have uncontrolled Hypertension?: No    Respiratory Respiratory Symptoms Reported: No symptoms reported Other Respiratory Symptoms: history of SOB at times related to her Sarcoidosis (lugs are affected) Respiratory Management Strategies: Routine screening, Medication therapy  Endocrine Endocrine Symptoms Reported: No symptoms reported (couple of weeks agao CBG 51 treatment with soda and snack recover to 79 eveually 150. No addition episodes or event and pt checks quit often) Is patient diabetic?: Yes Is patient checking blood sugars at home?: Yes List most recent blood sugar readings, include date and time of day: 135 fasting AM read today. Endocrine Self-Management Outcome: 4 (good)  Gastrointestinal Gastrointestinal Symptoms Reported: No symptoms reported      Genitourinary Genitourinary Symptoms Reported: No symptoms reported     Integumentary Integumentary Symptoms Reported: No symptoms reported    Musculoskeletal Musculoskelatal Symptoms Reviewed: Difficulty walking, Back pain, Joint pain Additional Musculoskeletal Details: patient report her musculoskelatal related to her dx of sarcoidosis Musculoskeletal Management Strategies: Medication therapy, Medical device, Routine screening Musculoskeletal Self-Management Outcome: 4 (good)      Psychosocial Psychosocial Symptoms Reported: No symptoms reported            11/04/2023    2:14 PM  Depression screen PHQ 2/9  Decreased Interest 0  Down, Depressed, Hopeless 0  PHQ - 2 Score 0    There were no vitals filed for this visit.  Medications Reviewed Today     Reviewed by Alvia Olam BIRCH, RN (Registered Nurse) on 11/25/23 at 1436  Med List Status: <None>   Medication Order Taking? Sig Documenting Provider Last Dose Status Informant  Accu-Chek Softclix Lancets lancets [id number] Yes Use as instructed Oley Bascom RAMAN, NP  Active   albuterol (VENTOLIN HFA) 108 (90 Base) MCG/ACT inhaler [id number] Yes Inhale 2 puffs into the lungs every 6 (six) hours as needed. Oley Bascom RAMAN, NP  Active   blood glucose meter kit and supplies KIT [id number] Yes Use up to four times daily as directed. Oley Bascom RAMAN, NP  Active   Dulaglutide (TRULICITY) 0.75 MG/0.5ML [name] [id number] Yes Inject 0.75 mg into the skin once a week. Oley Bascom RAMAN, NP  Active   Elastic Bandages & Supports (KNEE BRACE ADJUSTABLE HINGED) MISC [id number] Yes 1 application by Does not apply route daily. [name], NP  Active            Med Note MAYER, [facility] R   Thu Jun 10, 2023 11:19 AM) Supply  escitalopram (LEXAPRO) 10 MG tablet  [id number] Yes Take 1 tablet (10 mg total) by mouth daily. Oley Bascom RAMAN, NP  Active   folic acid (FOLVITE) 1 MG tablet [id number] Yes Take 1 tablet (1 mg total) by mouth daily.   Active   glucose blood test strip [id number] Yes Use as instructed Oley Bascom RAMAN, NP   Active            Med Note MAYER, [facility] R   Thu Jun 10, 2023 11:19 AM) supply  hydrochlorothiazide (HYDRODIURIL) 25 MG tablet [id number] Yes Take 1 tablet (25 mg total) by mouth daily. Oley Bascom RAMAN, NP  Active   insulin glargine (LANTUS) 100 UNIT/ML injection [id number] Yes Inject 0.34 mLs (34 Units total) into the skin daily. Oley Bascom RAMAN, NP  Active   Insulin Syringe-Needle U-100 (INSULIN SYRINGE .5CC/31GX5/16) 31G X 5/16 0.5 ML MISC [id number] Yes Use to inject insulin daily [name], [name], NP  Active            Med Note MAYER, [facility] R   Thu Jun 10, 2023 11:21 AM) Supply  losartan (COZAAR) 25 MG tablet [id number] Yes Take 1 tablet (25 mg total) by mouth daily. Oley Bascom RAMAN, NP  Active   metFORMIN (GLUCOPHAGE) 500 MG tablet [id number] Yes Take 1 tablet (500 mg total) by mouth 2 (two) times daily with a meal. [name], [name], FNP  Active   methotrexate (RHEUMATREX) 2.5 MG tablet [id number] Yes Take 4 tablets (10 mg total) by mouth once a week for 30 days, THEN 6 tablets (15 mg total) once a week.   Active   metoprolol succinate (TOPROL-XL) 50 MG 24 hr tablet [id number] Yes Take 1 tablet (50 mg total) by mouth daily. Take with or immediately following a meal. [name], [name], NP  Active   ondansetron (ZOFRAN) 4 MG tablet [id number] Yes Take 1 tablet (4 mg total) by mouth every 8 (eight) hours as needed for nausea or vomiting. [name], [name], FNP  Active            Med Note MAYER, [facility] R   Thu Jun 10, 2023 11:20 AM) Taking PRN  rosuvastatin (CRESTOR) 40 MG tablet [id number] Yes Take 1 tablet (40 mg total) by mouth daily. Oley Bascom RAMAN, NP  Active   Spacer/Aero-Holding Raguel ([address] DIAMOND-LG [place]) [place] [id number]  Dispense 1 [name], MD  Active             Recommendation:   PCP Follow-up Continue Current Plan of Care  Follow Up Plan:   Telephone follow up appointment date/time:  12/30/2023 @ 1:30 pm   Olam Ku, RN, BSN [place]   [facility], [facility] RN Care Manager Direct Dial: [phone]  Fax: [phone]

## 2023-11-28 ENCOUNTER — Other Ambulatory Visit: Payer: Self-pay

## 2023-11-29 ENCOUNTER — Other Ambulatory Visit: Payer: Self-pay

## 2023-11-29 MED ORDER — METHOTREXATE SODIUM 2.5 MG PO TABS
15.0000 mg | ORAL_TABLET | ORAL | 2 refills | Status: DC
  Filled 2023-11-29: qty 24, 28d supply, fill #0
  Filled 2024-01-02: qty 24, 28d supply, fill #1
  Filled 2024-01-24: qty 24, 28d supply, fill #2

## 2023-11-30 ENCOUNTER — Other Ambulatory Visit: Payer: Self-pay

## 2023-12-01 ENCOUNTER — Other Ambulatory Visit: Payer: Self-pay

## 2023-12-07 ENCOUNTER — Telehealth: Admitting: *Deleted

## 2023-12-09 ENCOUNTER — Other Ambulatory Visit: Payer: Self-pay

## 2023-12-13 ENCOUNTER — Other Ambulatory Visit: Payer: Self-pay

## 2023-12-15 ENCOUNTER — Encounter: Payer: Self-pay | Admitting: Nurse Practitioner

## 2023-12-15 ENCOUNTER — Ambulatory Visit (INDEPENDENT_AMBULATORY_CARE_PROVIDER_SITE_OTHER): Payer: Self-pay | Admitting: Nurse Practitioner

## 2023-12-15 VITALS — BP 143/80 | HR 81 | Ht 62.0 in | Wt 208.0 lb

## 2023-12-15 DIAGNOSIS — R110 Nausea: Secondary | ICD-10-CM

## 2023-12-15 DIAGNOSIS — N289 Disorder of kidney and ureter, unspecified: Secondary | ICD-10-CM

## 2023-12-15 DIAGNOSIS — Z794 Long term (current) use of insulin: Secondary | ICD-10-CM

## 2023-12-15 DIAGNOSIS — E118 Type 2 diabetes mellitus with unspecified complications: Secondary | ICD-10-CM

## 2023-12-15 LAB — POCT GLYCOSYLATED HEMOGLOBIN (HGB A1C): Hemoglobin A1C: 9 % — AB (ref 4.0–5.6)

## 2023-12-15 MED ORDER — ONDANSETRON HCL 4 MG PO TABS: 4.0000 mg | ORAL_TABLET | Freq: Three times a day (TID) | ORAL | 0 refills | Status: AC | PRN

## 2023-12-15 NOTE — Progress Notes (Signed)
 Subjective   Patient ID: Wanda Weaver, female    DOB: [birth year], 63 y.o.   MRN: [id number]  Chief Complaint  Patient presents with   Diabetes    Referring provider: Oley Bascom RAMAN, NP  Wanda Weaver is a 63 y.o. female with Past Medical History: 07/2019: Bilateral lower extremity edema 07/2019: Bilateral lower extremity pain No date: Diabetes mellitus without complication (HCC) No date: Hemoglobin A1C greater than 9%, indicating poor diabetic  control No date: Hyperlipidemia No date: Hypertension No date: Vitamin D  deficiency   HPI  Patient presents today for follow-up on diabetes and hypertension.  She has followed with pharmacy since her last visit here.  She does need a recheck on lipid panel and A1c today. A1C in office today is 9.0.  Patient is followed by pulmonary for sarcoidosis and neurology. Overall she has been stable since her last visit here. Denies f/c/s, n/v/d, hemoptysis, PND, leg swelling. Denies chest pain or edema.    Allergies  Allergen Reactions   Ibuprofen  Palpitations    Immunization History  Administered Date(s) Administered   Pneumococcal Polysaccharide-23 08/06/2017    Tobacco History: Social History   Tobacco Use  Smoking Status Never  Smokeless Tobacco Never   Counseling given: Not Answered   Outpatient Encounter Medications as of 12/15/2023  Medication Sig   Accu-Chek Softclix Lancets lancets Use as instructed   albuterol  (VENTOLIN  HFA) 108 (90 Base) MCG/ACT inhaler Inhale 2 puffs into the lungs every 6 (six) hours as needed.   blood glucose meter kit and supplies KIT Use up to four times daily as directed.   Dulaglutide  (TRULICITY ) 0.75 MG/0.5ML SOAJ Inject 0.75 mg into the skin once a week.   escitalopram  (LEXAPRO ) 10 MG tablet Take 1 tablet (10 mg total) by mouth daily.   folic acid  (FOLVITE ) 1 MG tablet Take 1 tablet (1 mg total) by mouth daily.   glucose blood test strip Use as instructed   hydrochlorothiazide  (HYDRODIURIL ) 25 MG  tablet Take 1 tablet (25 mg total) by mouth daily.   insulin  glargine (LANTUS ) 100 UNIT/ML injection Inject 0.34 mLs (34 Units total) into the skin daily.   Insulin  Syringe-Needle U-100 (INSULIN  SYRINGE .5CC/31GX5/16) 31G X 5/16 0.5 ML MISC Use to inject insulin  daily   losartan  (COZAAR ) 25 MG tablet Take 1 tablet (25 mg total) by mouth daily.   metFORMIN  (GLUCOPHAGE ) 500 MG tablet Take 1 tablet (500 mg total) by mouth 2 (two) times daily with a meal.   methotrexate  (RHEUMATREX) 2.5 MG tablet Take 6 tablets by mouth once a week   metoprolol  succinate (TOPROL -XL) 50 MG 24 hr tablet Take 1 tablet (50 mg total) by mouth daily. Take with or immediately following a meal.   rosuvastatin  (CRESTOR ) 40 MG tablet Take 1 tablet (40 mg total) by mouth daily.   [DISCONTINUED] ondansetron  (ZOFRAN ) 4 MG tablet Take 1 tablet (4 mg total) by mouth every 8 (eight) hours as needed for nausea or vomiting.   ondansetron  (ZOFRAN ) 4 MG tablet Take 1 tablet (4 mg total) by mouth every 8 (eight) hours as needed for nausea or vomiting.   [DISCONTINUED] Elastic Bandages & Supports (KNEE BRACE ADJUSTABLE HINGED) MISC 1 application by Does not apply route daily.   [DISCONTINUED] Spacer/Aero-Holding Chambers (OPTICHAMBER DIAMOND -LG MASK) DEVI Dispense 1   No facility-administered encounter medications on file as of 12/15/2023.    Review of Systems  Review of Systems  Constitutional: Negative.   HENT: Negative.    Cardiovascular: Negative.   Gastrointestinal: Negative.  Allergic/Immunologic: Negative.   Neurological: Negative.   Psychiatric/Behavioral: Negative.       Objective:   BP (!) 143/80   Pulse 81   Ht 5' 2 (1.575 m)   Wt 208 lb (94.3 kg)   SpO2 100%   BMI 38.04 kg/m   Wt Readings from Last 5 Encounters:  12/15/23 208 lb (94.3 kg)  09/13/23 212 lb 3.2 oz (96.3 kg)  08/04/23 212 lb 8 oz (96.4 kg)  05/24/23 211 lb 6.4 oz (95.9 kg)  04/16/23 207 lb (93.9 kg)     Physical Exam Vitals and  nursing note reviewed.  Constitutional:      General: She is not in acute distress.    Appearance: She is well-developed.  Cardiovascular:     Rate and Rhythm: Normal rate and regular rhythm.  Pulmonary:     Effort: Pulmonary effort is normal.     Breath sounds: Normal breath sounds.  Neurological:     Mental Status: She is alert and oriented to person, place, and time.       Assessment & Plan:   Abnormal kidney function -     Ambulatory referral to Nephrology  Type 2 diabetes mellitus with complication, with long-term current use of insulin  (HCC) -     Lipid panel -     POCT glycosylated hemoglobin (Hb A1C)  Nausea -     Ondansetron  HCl; Take 1 tablet (4 mg total) by mouth every 8 (eight) hours as needed for nausea or vomiting.  Dispense: 20 tablet; Refill: 0     Return in about 3 months (around 03/15/2024).     Bascom GORMAN Borer, NP 12/15/2023

## 2023-12-16 ENCOUNTER — Ambulatory Visit: Payer: Self-pay

## 2023-12-16 LAB — LIPID PANEL
Chol/HDL Ratio: 3.3 ratio (ref 0.0–4.4)
Cholesterol, Total: 156 mg/dL (ref 100–199)
HDL: 48 mg/dL (ref >39)
LDL Chol Calc (NIH): 69 mg/dL (ref 0–99)
Triglycerides: 240 mg/dL — ABNORMAL HIGH (ref 0–149)
VLDL Cholesterol Cal: 39 mg/dL (ref 5–40)

## 2023-12-21 ENCOUNTER — Other Ambulatory Visit: Payer: Self-pay

## 2023-12-22 ENCOUNTER — Other Ambulatory Visit (INDEPENDENT_AMBULATORY_CARE_PROVIDER_SITE_OTHER): Payer: Self-pay

## 2023-12-22 ENCOUNTER — Other Ambulatory Visit (HOSPITAL_COMMUNITY): Payer: Self-pay

## 2023-12-22 DIAGNOSIS — Z794 Long term (current) use of insulin: Secondary | ICD-10-CM

## 2023-12-22 DIAGNOSIS — E118 Type 2 diabetes mellitus with unspecified complications: Secondary | ICD-10-CM

## 2023-12-22 NOTE — Progress Notes (Signed)
 12/22/2023 Name: Dazha Kempa MRN: [id number] DOB: 05-15-1960  Chief Complaint  Patient presents with   Diabetes   Hypertension   Hyperlipidemia    Shaasia Odle is a 63 y.o. year old female who presented for a telephone visit.   They were referred to the pharmacist by their PCP for assistance in managing diabetes. PMH includes T2DM, HLD, HTN, pulmonary sarcoidosis.   Subjective: She was seen by her PCP, Bascom Borer, NP on 09/13/23. BP at that visit was 158/61 mmHg. Patient reported that she was told by neurology to stop metformin , but BG increased > 500 mg/dL and she put herself back on metformin . Patient also reported that Advair , prescribed by pulmonology, increased her BG. At pharmacy telephone appt on 09/20/23, patient was instructed to increase losartan  to 50 mg daily for elevated BP and start Trulicity  0.75 mg weekly. At pharmacy f/u on 11/08/23, patient reported tolerating Trulicity  ok, with some nausea after the injection that she felt was also influenced by methotrexate . She reported an episode of hypoglycemia and was instructed to decrease Lantus  to 30 units daily and continue Trulicity  and metformin . She was seen by PCP on 12/15/23. A1C improved to 9.0% from 9.6%. BP was elevated to 143/80 mmHg. No medication changes were made.   Today, she reports doing ok. She continues to have nausea after methotrexate  dose. This past Sunday she tried taking it in the afternoon with a snack and did not have nausea/vomiting on Sunday, but did have dry heaving throughout the day on Monday. She reports that ondansetron  prevents vomiting, but not nausea. She also mentions that she will need to have methotrexate  labs done at [facility], since she is having issues with orders at [facility]. She reports her BG have been well controlled, despite recently elevated A1C.   Care Team: Primary Care Provider: Borer Bascom RAMAN, NP ; Next Scheduled Visit: 03/15/24  Medication Access/Adherence  Current Pharmacy:  [facility]  [address] [place] - [address] [place] [address] Phone: [phone] Fax: [phone]   Patient reports affordability concerns with their medications: Yes  - not working since sarcoidosis dx Patient reports access/transportation concerns to their pharmacy: Yes  - Daughter has to take her to appt. Switched from [facility] to [employer].  Patient reports adherence concerns with their medications:  No     Diabetes:  Current medications: Lantus  (vial) 34 units injected once daily, metformin  IR 500 mg BID, Trulicity  0.75 mg weekly (Wednesday) Medications tried in the past: Jardiance  - consistent burning/itching while urinating; Ozempic  - severe nausea/vomiting. Patient reports that insulin  pen needles are more painful for her to inject than the insulin  syringe/vial.   Feels that nausea is primarily related to methotrexate , rather than Trulicity  - since it mainly occurs Sun/Mon/Tues after she takes her methotrexate  on Sunday.   Using Accu-Chek glucometer; testing 3 times daily - checks in the AM, noon, and PM  Highest yesterday 161 mg/dL FBG 878. This AM 111  Reports that she bought a new Accu Chek meter at [facility] after she had a lot of back and forth with the pharmacy. Her meter was broken and could not get it covered by insurance at the pharmacy.  11/08/23: AM 139 mg/dL 2/72/74: AM 872 mg/dL 2/73/74: AM 876 mg/dL Highest since starting Trulicity  213 mg/dL.  Patient denies hypoglycemic s/sx including dizziness, sweating and shakiness. Has history of BG dropping to 27 mg/dL - almost lost consciousness. Had worked 14 hours that day, did not have time to  eat. So she does have some fear of hypoglycemia.   Denies s/sx of hyperglycemia including polyuria, polydipsia, polyphagia, nocturia, neuropathy, blurred vision.  Current meal patterns: Reports that her appetite is very reduced with Trulicity . She is not eating snack foods, candy. Reports that she  has started using protein shakes. She also bought a TV dinner with protein and was only able to eat half.  - Drinks - zero sugar juice, sugar free tea, water 4-5 glasses per day  Current physical activity: limited by [employer] - walks to mailbox and back daily, laundry at home. Has cane and walker. Activity is more limited than usual currently due to heat.  Hypertension:  Current medications: hydrochlorothiazide  25 mg daily, losartan  25 mg daily, metoprolol  succinate 50 mg daily  Patient has a validated, automated, upper arm home BP cuff Current blood pressure readings readings: 132/60 mmHg last time, 129/59 mmHg reported at 11/08/23 telephone appt  Patient denies hypotensive s/sx including dizziness, lightheadedness.  Patient denies hypertensive symptoms including headache, chest pain, shortness of breath   Hyperlipidemia/ASCVD Risk Reduction  Current lipid lowering medications: rosuvastatin  40 mg daily (has to split pill to swallow)  Clinical ASCVD: No  The 10-year ASCVD risk score (Arnett DK, et al., 2019) is: 11.6%   Values used to calculate the score:     Age: 59 years     Clincally relevant sex: Female     Is Non-Hispanic African American: No     Diabetic: Yes     Tobacco smoker: No     Systolic Blood Pressure: 143 mmHg     Is BP treated: Yes     HDL Cholesterol: 48 mg/dL     Total Cholesterol: 156 mg/dL    Objective:  BP Readings from Last 3 Encounters:  12/15/23 (!) 143/80  09/13/23 (!) 158/61  08/04/23 128/84     Lab Results  Component Value Date   HGBA1C 9.0 (A) 12/15/2023   HGBA1C 9.6 (A) 09/13/2023   HGBA1C 9.4 (A) 05/24/2023    Lab Results  Component Value Date   CREATININE 1.31 (H) 10/12/2023   BUN 26 10/12/2023   NA 136 10/12/2023   K 4.4 10/12/2023   CL 97 10/12/2023   CO2 23 10/12/2023    Lab Results  Component Value Date   CHOL 156 12/15/2023   HDL 48 12/15/2023   LDLCALC 69 12/15/2023   TRIG 240 (H) 12/15/2023   CHOLHDL 3.3 12/15/2023    UACR 09/13/23: 46 mg/g   eGFR 01/19/23 > 47 mL/min  Vitamin D3 07/01/23: < 4 ng/mL - Patient was told she should not take vitamin D  supplement with sarcoidosis.   Medications Reviewed Today     Reviewed by Brinda Lorain SQUIBB, RPH (Pharmacist) on 12/22/23 at 1639  Med List Status: <None>   Medication Order Taking? Sig Documenting Provider Last Dose Status Informant  Accu-Chek Softclix Lancets lancets [id number]  Use as instructed Oley Bascom RAMAN, NP  Active   albuterol  (VENTOLIN  HFA) 108 (90 Base) MCG/ACT inhaler [id number]  Inhale 2 puffs into the lungs every 6 (six) hours as needed. Oley Bascom RAMAN, NP  Active   blood glucose meter kit and supplies KIT [id number]  Use up to four times daily as directed. Oley Bascom RAMAN, NP  Active   Dulaglutide  (TRULICITY ) 0.75 MG/0.5ML [name] [id number] Yes Inject 0.75 mg into the skin once a week. Oley Bascom RAMAN, NP  Active   escitalopram  (LEXAPRO ) 10 MG tablet [id number]  Take 1 tablet (10 mg total) by  mouth daily. Oley Bascom RAMAN, NP  Active   folic acid  (FOLVITE ) 1 MG tablet [id number] Yes Take 1 tablet (1 mg total) by mouth daily.   Active   glucose blood test strip [id number]  Use as instructed [name], [name], NP  Active            Med Note MAYER, [facility] R   Thu Jun 10, 2023 11:19 AM) supply  hydrochlorothiazide  (HYDRODIURIL ) 25 MG tablet [id number] Yes Take 1 tablet (25 mg total) by mouth daily. [name], [name], NP  Active   insulin  glargine (LANTUS ) 100 UNIT/ML injection [id number] Yes Inject 0.34 mLs (34 Units total) into the skin daily. Oley Bascom RAMAN, NP  Active   Insulin  Syringe-Needle U-100 (INSULIN  SYRINGE .5CC/31GX5/16) 31G X 5/16 0.5 ML MISC [id number]  Use to inject insulin  daily [name], [name], NP  Active            Med Note MAYER, [facility] R   Thu Jun 10, 2023 11:21 AM) Supply  losartan  (COZAAR ) 25 MG tablet [id number] Yes Take 1 tablet (25 mg total) by mouth daily. Oley Bascom RAMAN, NP  Active   metFORMIN  (GLUCOPHAGE ) 500 MG tablet  [id number] Yes Take 1 tablet (500 mg total) by mouth 2 (two) times daily with a meal. [name], [name], FNP  Active   methotrexate  (RHEUMATREX) 2.5 MG tablet [id number] Yes Take 6 tablets by mouth once a week   Active   metoprolol  succinate (TOPROL -XL) 50 MG 24 hr tablet [id number] Yes Take 1 tablet (50 mg total) by mouth daily. Take with or immediately following a meal. Oley Bascom RAMAN, NP  Active   ondansetron  (ZOFRAN ) 4 MG tablet [id number] Yes Take 1 tablet (4 mg total) by mouth every 8 (eight) hours as needed for nausea or vomiting. Oley Bascom RAMAN, NP  Active   rosuvastatin  (CRESTOR ) 40 MG tablet [id number] Yes Take 1 tablet (40 mg total) by mouth daily. Oley Bascom RAMAN, NP  Active             Assessment/Plan:   Diabetes: - Currently uncontrolled with last A1C of 9.0% above goal < 7%, improved from 9.6% since starting Trulicity . A1C of 9.0% is higher than expected with patient reported SMBG. Hesitant to continue titrating Trulicity  due to ongoing n/v with methotrexate . Encouraged patient to reach out to prescriber [facility]) to discuss ways to prevent nausea. Advised her to continue taking in the afternoon with a snack. Patient is a good candidate for a GLP-1RA with BMI > 35 and uncontrolled BG. She was able to successfully start Trulicity , despite severe GI reaction to Ozempic  in the past. Once nausea is controlled, will consider increasing to Trulicity  1.5 mg weekly. With her last eGFR of 47 mL/min, continue to recommend max metformin  dose of 1000 mg per day.  - Reviewed long term cardiovascular and renal outcomes of uncontrolled blood sugar - Reviewed goal A1c, goal fasting, and goal 2 hour post prandial glucose - Reviewed dietary modifications including eating small meals and snacks throughout the day, staying hydrated with water, and emphasized protein intake with each meal. Advised patient to not go long periods without eating, which could worsen nausea associated with GLP-1 and  methotrexate .  - Reviewed lifestyle modifications including: increasing physical activity as able.  - Recommend to continue Lantus  34 units daily - Recommend to continue metformin  to 1 tablet (500 mg) twice daily for reduced kidney function - Recommend to continue Trulicity  0.75 mg once weekly  - Patient denies personal  or family history of multiple endocrine neoplasia type 2, medullary thyroid  cancer; personal history of pancreatitis or gallbladder disease. - Recommend to check glucose three times daily with glucometer. Patient reports she has been denied coverage for CGM in the past. Expect she would be approved now, but she would like to continue fingersticks.    Hypertension: - Currently controlled with home BP below goal < 130/80 mmHg, though did not reassess home BP control today.  UACR demonstrates microalbuminuria, but patient did not tolerate ARB dose increase due to HA. Appropriate to continue current regimen. - Reviewed long term cardiovascular and renal outcomes of uncontrolled blood pressure - Reviewed appropriate blood pressure monitoring technique and reviewed goal blood pressure. Recommended to check home blood pressure and heart rate once daily.  - Recommend to continue hydrochlorothiazide  25 mg daily, metoprolol  succinate 50 mg daily, losartan  25 mg daily   Hyperlipidemia/ASCVD Risk Reduction: - Currently controlled with last LDL-C 69 mg/dL below goal < 70 mg/dL. Appropriate to continue high intensity statin. - Reviewed long term complications of uncontrolled cholesterol - Recommend to continue rosuvastatin  40 mg daily  Follow Up Plan: PCP 03/15/24, Pharmacist telephone 01/28/24  Lorain Baseman, PharmD [facility] [phone]

## 2023-12-30 ENCOUNTER — Other Ambulatory Visit: Payer: Self-pay | Admitting: *Deleted

## 2023-12-30 NOTE — Patient Outreach (Signed)
 Complex Care Management   Visit Note  12/30/2023  Name:  Wanda Weaver MRN: [id number] DOB: 04-01-1961  Situation: Referral received for Complex Care Management related to Sarcoidosis. I obtained verbal consent from Patient.  Visit completed with Patient  on the phone  Background:   Past Medical History:  Diagnosis Date   Bilateral lower extremity edema 07/2019   Bilateral lower extremity pain 07/2019   Diabetes mellitus without complication (HCC)    Hemoglobin A1C greater than 9%, indicating poor diabetic control    Hyperlipidemia    Hypertension    Vitamin D  deficiency     Assessment: Patient Reported Symptoms:  Cognitive Cognitive Status: Alert and oriented to person, place, and time, Normal speech and language skills, Able to follow simple commands   Health Maintenance Behaviors: Annual physical exam  Neurological Neurological Review of Symptoms: No symptoms reported    HEENT HEENT Symptoms Reported: Nasal discharge (Seasonal allergies- educated on OTC. MD aware with no prescribed  medications.) HEENT Management Strategies: Coping strategies, Routine screening    Cardiovascular Cardiovascular Symptoms Reported: No symptoms reported Does patient have uncontrolled Hypertension?: No Weight: 206 lb (93.4 kg)  Respiratory Respiratory Symptoms Reported: No symptoms reported Other Respiratory Symptoms: Hx of SOB related to her Sarcoidosis increased with excertionary activities    Endocrine Endocrine Symptoms Reported: No symptoms reported Is patient diabetic?: Yes Is patient checking blood sugars at home?: Yes List most recent blood sugar readings, include date and time of day: 130 fadting CBG this morning with average 98-110. Endocrine Self-Management Outcome: 4 (good)  Gastrointestinal Gastrointestinal Symptoms Reported: No symptoms reported      Genitourinary Genitourinary Symptoms Reported: No symptoms reported    Integumentary Integumentary Symptoms Reported: No symptoms  reported    Musculoskeletal Musculoskelatal Symptoms Reviewed: Back pain, Difficulty walking Additional Musculoskeletal Details: Patient reports all musculoskelatal symptoms related to her sarcoidosis Musculoskeletal Management Strategies: Medication therapy, Medical device, Coping strategies, Routine screening Musculoskeletal Self-Management Outcome: 4 (good)      Psychosocial Psychosocial Symptoms Reported: No symptoms reported Behavioral Management Strategies: Coping strategies, Adequate rest, Support system, Medication therapy   Quality of Family Relationships: supportive Do you feel physically threatened by others?: No    There were no vitals filed for this visit.  Medications Reviewed Today     Reviewed by Alvia Olam BIRCH, RN (Registered Nurse) on 12/30/23 at 1343  Med List Status: <None>   Medication Order Taking? Sig Documenting Provider Last Dose Status Informant  Accu-Chek Softclix Lancets lancets [id number] Yes Use as instructed Oley Bascom RAMAN, NP  Active   albuterol  (VENTOLIN  HFA) 108 (90 Base) MCG/ACT inhaler [id number] Yes Inhale 2 puffs into the lungs every 6 (six) hours as needed. Oley Bascom RAMAN, NP  Active   blood glucose meter kit and supplies KIT [id number] Yes Use up to four times daily as directed. Oley Bascom RAMAN, NP  Active   Dulaglutide  (TRULICITY ) 0.75 MG/0.5ML [name] [id number] Yes Inject 0.75 mg into the skin once a week. Oley Bascom RAMAN, NP  Active   escitalopram  (LEXAPRO ) 10 MG tablet [id number] Yes Take 1 tablet (10 mg total) by mouth daily. Oley Bascom RAMAN, NP  Active   folic acid  (FOLVITE ) 1 MG tablet [id number] Yes Take 1 tablet (1 mg total) by mouth daily.   Active   glucose blood test strip [id number] Yes Use as instructed [name], [name], NP  Active            Med Note MAYER, [facility] R   Thu Jun 10, 2023 11:19 AM) supply  hydrochlorothiazide  (HYDRODIURIL ) 25 MG tablet [id number] Yes Take 1 tablet (25 mg total) by mouth daily. [name], [name], NP   Active   insulin  glargine (LANTUS ) 100 UNIT/ML injection [id number] Yes Inject 0.34 mLs (34 Units total) into the skin daily. Oley Bascom RAMAN, NP  Active   Insulin  Syringe-Needle U-100 (INSULIN  SYRINGE .5CC/31GX5/16) 31G X 5/16 0.5 ML MISC [id number] Yes Use to inject insulin  daily Oley Bascom RAMAN, NP  Active            Med Note MAYER, [facility] R   Thu Jun 10, 2023 11:21 AM) Supply  losartan  (COZAAR ) 25 MG tablet [id number] Yes Take 1 tablet (25 mg total) by mouth daily. Oley Bascom RAMAN, NP  Active   metFORMIN  (GLUCOPHAGE ) 500 MG tablet [id number] Yes Take 1 tablet (500 mg total) by mouth 2 (two) times daily with a meal. [name], [name], FNP  Active   methotrexate  (RHEUMATREX) 2.5 MG tablet [id number] Yes Take 6 tablets by mouth once a week   Active   metoprolol  succinate (TOPROL -XL) 50 MG 24 hr tablet [id number] Yes Take 1 tablet (50 mg total) by mouth daily. Take with or immediately following a meal. Oley Bascom RAMAN, NP  Active   ondansetron  (ZOFRAN ) 4 MG tablet [id number] Yes Take 1 tablet (4 mg total) by mouth every 8 (eight) hours as needed for nausea or vomiting. Oley Bascom RAMAN, NP  Active   rosuvastatin  (CRESTOR ) 40 MG tablet [id number] Yes Take 1 tablet (40 mg total) by mouth daily. Oley Bascom RAMAN, NP  Active             Recommendation:   PCP Follow-up Continue Current Plan of Care  Follow Up Plan:   Telephone follow up appointment date/time:  01/18/2024 @ 11:00 am   Olam Ku, RN, BSN [place]  [facility] RN Care Manager Direct Dial: [phone]  Fax: [phone]

## 2023-12-30 NOTE — Patient Instructions (Signed)
 Visit Information  Thank you for taking time to visit with me today. Please don't hesitate to contact me if I can be of assistance to you before our next scheduled appointment.  Your next care management appointment is by telephone on 01/18/2024 at 11:00 am  Please call the care guide team at [phone] if you need to cancel, schedule, or reschedule an appointment.   Please call the Suicide and Crisis Lifeline: 988 call the [place]  National Suicide Prevention Lifeline: 204-351-2919 or TTY: 615-843-4990 TTY 385-635-7738) to talk to a trained counselor call 1-800-273-TALK (toll free, 24 hour hotline) if you are experiencing a Mental Health or Behavioral Health Crisis or need someone to talk to.  Olam Ku, RN, BSN [place]  [facility], [facility] RN Care Manager Direct Dial: [phone]  Fax: [phone]

## 2023-12-31 ENCOUNTER — Other Ambulatory Visit: Payer: Self-pay

## 2024-01-03 ENCOUNTER — Other Ambulatory Visit: Payer: Self-pay

## 2024-01-03 ENCOUNTER — Other Ambulatory Visit: Payer: Self-pay | Admitting: Nurse Practitioner

## 2024-01-03 DIAGNOSIS — Z13220 Encounter for screening for lipoid disorders: Secondary | ICD-10-CM

## 2024-01-03 DIAGNOSIS — E118 Type 2 diabetes mellitus with unspecified complications: Secondary | ICD-10-CM

## 2024-01-04 LAB — COMPREHENSIVE METABOLIC PANEL WITH GFR
ALT: 28 IU/L (ref 0–32)
AST: 23 IU/L (ref 0–40)
Albumin: 4.1 g/dL (ref 3.9–4.9)
Alkaline Phosphatase: 83 IU/L (ref 49–135)
BUN/Creatinine Ratio: 15 (ref 12–28)
BUN: 18 mg/dL (ref 8–27)
Bilirubin Total: 0.6 mg/dL (ref 0.0–1.2)
CO2: 23 mmol/L (ref 20–29)
Calcium: 9.2 mg/dL (ref 8.7–10.3)
Chloride: 94 mmol/L — ABNORMAL LOW (ref 96–106)
Creatinine, Ser: 1.17 mg/dL — ABNORMAL HIGH (ref 0.57–1.00)
Globulin, Total: 2.4 g/dL (ref 1.5–4.5)
Glucose: 201 mg/dL — ABNORMAL HIGH (ref 70–99)
Potassium: 4.5 mmol/L (ref 3.5–5.2)
Sodium: 130 mmol/L — ABNORMAL LOW (ref 134–144)
Total Protein: 6.5 g/dL (ref 6.0–8.5)
eGFR: 53 mL/min/1.73 — ABNORMAL LOW (ref >59)

## 2024-01-04 LAB — CBC
Hematocrit: 32.5 % — ABNORMAL LOW (ref 34.0–46.6)
Hemoglobin: 10.3 g/dL — ABNORMAL LOW (ref 11.1–15.9)
MCH: 27.8 pg (ref 26.6–33.0)
MCHC: 31.7 g/dL (ref 31.5–35.7)
MCV: 88 fL (ref 79–97)
Platelets: 200 x10E3/uL (ref 150–450)
RBC: 3.71 x10E6/uL — ABNORMAL LOW (ref 3.77–5.28)
RDW: 15.6 % — ABNORMAL HIGH (ref 11.7–15.4)
WBC: 6.1 x10E3/uL (ref 3.4–10.8)

## 2024-01-04 LAB — LIPID PANEL
Chol/HDL Ratio: 2.8 ratio (ref 0.0–4.4)
Cholesterol, Total: 122 mg/dL (ref 100–199)
HDL: 44 mg/dL (ref >39)
LDL Chol Calc (NIH): 52 mg/dL (ref 0–99)
Triglycerides: 154 mg/dL — ABNORMAL HIGH (ref 0–149)
VLDL Cholesterol Cal: 26 mg/dL (ref 5–40)

## 2024-01-05 ENCOUNTER — Ambulatory Visit: Payer: Self-pay | Admitting: Nurse Practitioner

## 2024-01-10 ENCOUNTER — Encounter (HOSPITAL_BASED_OUTPATIENT_CLINIC_OR_DEPARTMENT_OTHER): Payer: Self-pay | Admitting: Pulmonary Disease

## 2024-01-10 ENCOUNTER — Ambulatory Visit (INDEPENDENT_AMBULATORY_CARE_PROVIDER_SITE_OTHER): Admitting: Pulmonary Disease

## 2024-01-10 VITALS — BP 138/80 | HR 87 | Ht 62.0 in | Wt 210.3 lb

## 2024-01-10 DIAGNOSIS — Z79899 Other long term (current) drug therapy: Secondary | ICD-10-CM

## 2024-01-10 DIAGNOSIS — Z9225 Personal history of immunosupression therapy: Secondary | ICD-10-CM

## 2024-01-10 DIAGNOSIS — D869 Sarcoidosis, unspecified: Secondary | ICD-10-CM

## 2024-01-10 DIAGNOSIS — J42 Unspecified chronic bronchitis: Secondary | ICD-10-CM

## 2024-01-10 DIAGNOSIS — E1121 Type 2 diabetes mellitus with diabetic nephropathy: Secondary | ICD-10-CM | POA: Diagnosis not present

## 2024-01-10 DIAGNOSIS — R591 Generalized enlarged lymph nodes: Secondary | ICD-10-CM | POA: Diagnosis not present

## 2024-01-10 DIAGNOSIS — D860 Sarcoidosis of lung: Secondary | ICD-10-CM

## 2024-01-10 MED ORDER — FOLIC ACID 1 MG PO TABS: 2.0000 mg | ORAL_TABLET | Freq: Every day | ORAL | 6 refills | Status: AC

## 2024-01-10 MED ORDER — FOLIC ACID 1 MG PO TABS: 1.0000 mg | ORAL_TABLET | Freq: Every day | ORAL | 6 refills | Status: DC

## 2024-01-10 NOTE — Patient Instructions (Addendum)
 Sarcoid Immunosuppressant management by [facility] --Reviewed pulmonary function tests. Will recheck in 6 months for improvement on immunosuppression --CONTINUE methotrexate  15 mg weekly --INCREASE folic acid  2 mg daily

## 2024-01-10 NOTE — Progress Notes (Signed)
 Subjective:   PATIENT ID: Wanda Weaver GENDER: female DOB: [birth year], MRN: [id number]  Chief Complaint  Patient presents with   Follow-up    Sarcoidosis    Reason for Visit: Follow-up sarcoidosis  [name] is a 63 year old female never smoker with sarcoidosis, HTN, DM2, HLD who presents for sarcoidosis management.  Initial consult She had abnormal CT CAP with numerous nodules including chest lymphadenopathy. She underwent biopsy left supraclavicular on 01/19/23 with results lymph node with abundant granulomatous inflammation. No evidence of lymphoma identified.  She reports shortness of breath with activity for over a years that has gradually worsened. Sometimes cough and wheezing. She uses albuterol  up to 3 times a week usually with exertion. Denies chest pain. She has limited activity due to nerve pain that initially began two years and a year ago her left leg started having nerve issues. Unchanged weight in the 200 lbs range. DM2 has not been controlled with last A1c 8.3. She has cloudy vision and planning to see her eye doctor.  08/04/23 Since our last visit she reports unchanged shortness of breath with occasional wheezing and cough. Worsens with activity and sometimes associated with chest pain that resolves when stopping the activity including walking. Able to walk to her mailbox which is about 50 ft but difficulty walking back to her home due uphill. She has been seen by [facility] Neurology for sarcoid. Note 07/01/23 reviewed. Ordered MRI thoracic and lumbar and whole body PET/CT. Planning for Referred to endocrine, rheumatology and ophthalmology. Reports she has seen optho and no evidence of sarcoid but DM retinopathy present.  01/10/24 Since our last visit she was started on methotrexate  by Dr. Dennise, Neurology. No CNS involvement. Ophthalmology exam was neg for active sarcoid but demonstrated DM with nonproliferative diabetic retinopathy. She has been on methotrexate  15 mg since  10/05/23 and folic acid  1 mg daily with plan to continue immunosuppression until she can be seen by Rheumatology in 03/2024. She is having nausea and fatigue with the methotrexate . Improved shortness of breath with exertion. No wheezing.  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  Past Medical History:  Diagnosis Date   Bilateral lower extremity edema 07/2019   Bilateral lower extremity pain 07/2019   Diabetes mellitus without complication (HCC)    Hemoglobin A1C greater than 9%, indicating poor diabetic control    Hyperlipidemia    Hypertension    Vitamin D  deficiency      Family History  Problem Relation Age of Onset   Diabetes Mother    Dementia Mother    Diabetes Father    Hypertension Father      Social History   Occupational History   Not on file  Tobacco Use   Smoking status: Never   Smokeless tobacco: Never  Vaping Use   Vaping status: Never Used  Substance and Sexual Activity   Alcohol use: No   Drug use: No   Sexual activity: Yes    Partners: Male    Allergies  Allergen Reactions   Ibuprofen  Palpitations     Outpatient Medications Prior to Visit  Medication Sig Dispense Refill   Accu-Chek Softclix Lancets lancets Use as instructed 200 each 12   albuterol  (VENTOLIN  HFA) 108 (90 Base) MCG/ACT inhaler Inhale 2 puffs into the lungs every 6 (six) hours as needed. 18 g 2   blood glucose meter kit and supplies KIT Use up to four times  daily as directed. 1 each 0   Dulaglutide  (TRULICITY ) 0.75 MG/0.5ML SOAJ Inject 0.75 mg into the skin once a week. 2 mL 3   escitalopram  (LEXAPRO ) 10 MG tablet Take 1 tablet (10 mg total) by mouth daily. 30 tablet 2   glucose blood test strip Use as instructed 100 each 12   hydrochlorothiazide  (HYDRODIURIL ) 25 MG tablet Take 1 tablet (25 mg total) by mouth daily. 90 tablet 1   insulin  glargine (LANTUS ) 100 UNIT/ML injection Inject 0.34 mLs  (34 Units total) into the skin daily. 10 mL 11   Insulin  Syringe-Needle U-100 (INSULIN  SYRINGE .5CC/31GX5/16) 31G X 5/16 0.5 ML MISC Use to inject insulin  daily 100 each 2   losartan  (COZAAR ) 25 MG tablet Take 1 tablet (25 mg total) by mouth daily. 90 tablet 3   metFORMIN  (GLUCOPHAGE ) 500 MG tablet Take 1 tablet (500 mg total) by mouth 2 (two) times daily with a meal. 180 tablet 3   methotrexate  (RHEUMATREX) 2.5 MG tablet Take 6 tablets by mouth once a week 24 tablet 2   metoprolol  succinate (TOPROL -XL) 50 MG 24 hr tablet Take 1 tablet (50 mg total) by mouth daily. Take with or immediately following a meal. 90 tablet 3   ondansetron  (ZOFRAN ) 4 MG tablet Take 1 tablet (4 mg total) by mouth every 8 (eight) hours as needed for nausea or vomiting. 20 tablet 0   rosuvastatin  (CRESTOR ) 40 MG tablet Take 1 tablet (40 mg total) by mouth daily. 90 tablet 1   folic acid  (FOLVITE ) 1 MG tablet Take 1 tablet (1 mg total) by mouth daily. 30 tablet 7   No facility-administered medications prior to visit.    Review of Systems  Constitutional:  Negative for chills, diaphoresis, fever, malaise/fatigue and weight loss.  HENT:  Negative for congestion.   Respiratory:  Positive for shortness of breath and wheezing. Negative for cough, hemoptysis and sputum production.   Cardiovascular:  Negative for chest pain, palpitations and leg swelling.     Objective:   Vitals:   01/10/24 1432  BP: 138/80  Pulse: 87  SpO2: (!) 87%  Weight: 210 lb 4.8 oz (95.4 kg)  Height: 5' 2 (1.575 m)    SpO2: (!) 87 %. <Incorrectly documented by staff. SpO2 94%.  Physical Exam: General: Well-appearing, no acute distress HENT: [place], AT Eyes: EOMI, no scleral icterus Respiratory: Clear to auscultation bilaterally.  No crackles, wheezing or rales Cardiovascular: RRR, -M/R/G, no JVD Extremities:-Edema,-tenderness Neuro: AAO x4, CNII-XII grossly intact Psych: Normal mood, normal affect  Data Reviewed:  Imaging: CT Chest  abdomen pelvis 12/07/22 - Numerous enlarged lower left cervical and supraclavicular nodules measuring up to 1.7 x 1.4 cm. Numerous enlarged mediastinal and bilateral hilar lymph nodes, largest right paratracheal node 2.4 x 1.9 cm. Numerous small bilateral pulmonary nodules including 0.5 cm LUL and0.4 cm nodule RUL. Includes gastrohepatic ligament, porta hepatis, portacaval nodules in upper abdomen  PET/CT Myocardial Sarcoid 07/21/23 - No evidence of cardiac sarcoid. Unchanged mediastinal and hilar lymph nodes  MR thoracic and lumbar spine 09/28/23 - No abnormal intraspinal enhancement and Bone marrow signal intensity is normal. The visualized cord is unremarkable and the conus medullaris ends at a normal level. There is no abnormal enhancement.   PFT: 01/10/24 FVC 1.80 (59%) FEV1 1.44 (62%) Ratio 80 DLCO 59% Interpretation: Reduced FVC and FEV1 suggestive of restrictive defect. Moderately reduced gas exchange  Labs:    Latest Ref Rng & Units 01/03/2024    8:41 AM 10/12/2023  1:49 PM 01/19/2023    6:58 AM  CBC  WBC 3.4 - 10.8 x10E3/uL 6.1  5.4  4.9   Hemoglobin 11.1 - 15.9 g/dL 89.6  88.8  88.7   Hematocrit 34.0 - 46.6 % 32.5  35.1  34.4   Platelets 150 - 450 x10E3/uL 200  207  201       Latest Ref Rng & Units 01/03/2024    8:41 AM 10/12/2023    1:49 PM 01/19/2023    6:58 AM  CMP  Glucose 70 - 99 mg/dL 798  877  795   BUN 8 - 27 mg/dL 18  26  22    Creatinine 0.57 - 1.00 mg/dL 8.82  8.68  8.70   Sodium 134 - 144 mmol/L 130  136  136   Potassium 3.5 - 5.2 mmol/L 4.5  4.4  4.1   Chloride 96 - 106 mmol/L 94  97  98   CO2 20 - 29 mmol/L 23  23  25    Calcium  8.7 - 10.3 mg/dL 9.2  9.7  9.9   Total Protein 6.0 - 8.5 g/dL 6.5  7.1    Total Bilirubin 0.0 - 1.2 mg/dL 0.6  0.5    Alkaline Phos 49 - 135 IU/L 83  81    AST 0 - 40 IU/L 23  21    ALT 0 - 32 IU/L 28  17        Assessment & Plan:   Discussion: 63 year old female never smoker with sarcoidosis, HTN, DM2, HLD who presents for  follow-up for pulmonary sarcoid. Started on methotrexate  end of 09/2023 with plan for evaluation with rheumatology. If rheumatology w/u negative, I am happy to resume care for immunosuppression for pulmonary sarcoid. Due to DM2, patient unable to tolerated prednisone  or ICS/LABA. We reviewed PFTs and possible restrictive defect with reduced gas exchange. Will monitor response while on immunosuppression.  We discussed the clinical course of sarcoid and management including serial PFTs, labs, eye exam, and EKG and chest imaging if indicated.    Pulmonary sarcoidosis with diffuse lymphadenopathy Chronic bronchitis --Dx in 01/2023  --Reviewed PET myocardial sarcoid. Neg for cardiac sarcoid --Reviewed pulmonary function tests. Will recheck in 6 months for improvement on immunosuppression --Dc'd Advair  HFA due to hyperglycemia --CONTINUE Albuterol  TWO puffs as needed --USE spacer with inhalers  History of immunosuppression High risk medication management --No prior history --Hold on prednisone  due to uncontrolled DM2.  --CONTINUE methotrexate  15 mg weekly --INCREASE folic acid  2 mg daily  Sarcoid Monitoring --Recent chest imaging reviewed.  --Annual PFTs.  Need to schedule --Annual ophthalmology exam.  04/2023  DM2 with peripheral neuropathy --Encouraged to discuss with PCP for aggressive glucose control. Last A1c. Has been referred by Endocrine --Following Dr. Dennise at [facility]. No CNS involvement   Health Maintenance Immunization History  Administered Date(s) Administered   Pneumococcal Polysaccharide-23 08/06/2017   CT Lung Screen - not qualified. Never smoker  Orders Placed This Encounter  Procedures   Pulmonary function test    Standing Status:   Future    Expiration Date:   01/09/2025    Where should this test be performed?:   Outpatient Pulmonary    What type of PFT is being ordered?:   Full PFT   Meds ordered this encounter  Medications   DISCONTD: folic acid  (FOLVITE ) 1 MG  tablet    Sig: Take 1 tablet (1 mg total) by mouth daily.    Dispense:  30 tablet    Refill:  6   folic acid  (FOLVITE ) 1 MG tablet    Sig: Take 2 tablets (2 mg total) by mouth daily.    Dispense:  30 tablet    Refill:  6    Return in about 6 months (around 07/09/2024) for after PFT.  I have spent a total time of 45-minutes on the day of the appointment including chart review of [facility] records, data review, collecting history, coordinating care and discussing medical diagnosis and plan with the patient/family. Past medical history, allergies, medications were reviewed. Pertinent imaging, labs and tests included in this note have been reviewed and interpreted independently by me.  Ellyn Rubiano Slater Staff, MD [facility] 01/10/2024 3:13 PM

## 2024-01-18 ENCOUNTER — Other Ambulatory Visit: Payer: Self-pay | Admitting: *Deleted

## 2024-01-18 NOTE — Patient Outreach (Signed)
 Complex Care Management   Visit Note  01/18/2024  Name:  Wanda Weaver MRN: [id number] DOB: 1960-05-07  Situation: Referral received for Complex Care Management related to Diabetes with Complications and Sarcoidosis. I obtained verbal consent from Patient.  Visit completed with Patient  on the phone  Background:   Past Medical History:  Diagnosis Date   Bilateral lower extremity edema 07/2019   Bilateral lower extremity pain 07/2019   Diabetes mellitus without complication (HCC)    Hemoglobin A1C greater than 9%, indicating poor diabetic control    Hyperlipidemia    Hypertension    Vitamin D  deficiency     Assessment: Patient Reported Symptoms:  Cognitive Cognitive Status: Able to follow simple commands, Alert and oriented to person, place, and time, Normal speech and language skills      Neurological Neurological Review of Symptoms: No symptoms reported    HEENT HEENT Symptoms Reported: No symptoms reported HEENT Management Strategies: Coping strategies, Routine screening    Cardiovascular Cardiovascular Symptoms Reported: No symptoms reported Does patient have uncontrolled Hypertension?: Yes Is patient checking Blood Pressure at home?: Yes Patient's Recent BP reading at home: Reports last read 134/61 asymptomatic Cardiovascular Management Strategies: Coping strategies, Routine screening Weight: 210 lb (95.3 kg) Cardiovascular Self-Management Outcome: 4 (good)  Respiratory Respiratory Symptoms Reported: Shortness of breath Other Respiratory Symptoms: Ongoing SOB from Sarcoidosis with increased activities with good recovery at rest. Respiratory Management Strategies: Coping strategies, Routine screening, Adequate rest, Medication therapy Respiratory Self-Management Outcome: 4 (good)  Endocrine Endocrine Symptoms Reported: No symptoms reported Is patient diabetic?: Yes Is patient checking blood sugars at home?: Yes List most recent blood sugar readings, include date and time  of day: Reports last CBG 135 with Endocrinologist 12/15/2023 A1C 9.0%    Gastrointestinal Gastrointestinal Symptoms Reported: No symptoms reported      Genitourinary Genitourinary Symptoms Reported: No symptoms reported    Integumentary Integumentary Symptoms Reported: No symptoms reported    Musculoskeletal Musculoskelatal Symptoms Reviewed: Back pain, Difficulty walking (Result from diagnosis with Sarcoidosis) Musculoskeletal Management Strategies: Routine screening, Medical device, Coping strategies Musculoskeletal Self-Management Outcome: 4 (good)      Psychosocial Psychosocial Symptoms Reported: No symptoms reported Behavioral Management Strategies: Coping strategies, Adequate rest, Medication therapy        There were no vitals filed for this visit.  Medications Reviewed Today     Reviewed by Alvia Olam BIRCH, RN (Registered Nurse) on 01/18/24 at 1116  Med List Status: <None>   Medication Order Taking? Sig Documenting Provider Last Dose Status Informant  Accu-Chek Softclix Lancets lancets [id number] Yes Use as instructed Oley Bascom RAMAN, NP  Active   albuterol  (VENTOLIN  HFA) 108 (90 Base) MCG/ACT inhaler [id number] Yes Inhale 2 puffs into the lungs every 6 (six) hours as needed. Oley Bascom RAMAN, NP  Active   blood glucose meter kit and supplies KIT [id number] Yes Use up to four times daily as directed. Oley Bascom RAMAN, NP  Active   Dulaglutide  (TRULICITY ) 0.75 MG/0.5ML [name] [id number] Yes Inject 0.75 mg into the skin once a week. Oley Bascom RAMAN, NP  Active   escitalopram  (LEXAPRO ) 10 MG tablet [id number] Yes Take 1 tablet (10 mg total) by mouth daily. Oley Bascom RAMAN, NP  Active   folic acid  (FOLVITE ) 1 MG tablet [id number] Yes Take 2 tablets (2 mg total) by mouth daily. [name], MD  Active   glucose blood test strip [id number] Yes Use as instructed Oley Bascom RAMAN, NP  Active  Med Note MAYER, [facility] R   Thu Jun 10, 2023 11:19 AM) supply   hydrochlorothiazide  (HYDRODIURIL ) 25 MG tablet [id number] Yes Take 1 tablet (25 mg total) by mouth daily. Oley Bascom RAMAN, NP  Active   insulin  glargine (LANTUS ) 100 UNIT/ML injection [id number] Yes Inject 0.34 mLs (34 Units total) into the skin daily. Oley Bascom RAMAN, NP  Active   Insulin  Syringe-Needle U-100 (INSULIN  SYRINGE .5CC/31GX5/16) 31G X 5/16 0.5 ML MISC [id number] Yes Use to inject insulin  daily [name], [name], NP  Active            Med Note MAYER, [facility] R   Thu Jun 10, 2023 11:21 AM) Supply  losartan  (COZAAR ) 25 MG tablet [id number] Yes Take 1 tablet (25 mg total) by mouth daily. Oley Bascom RAMAN, NP  Active   metFORMIN  (GLUCOPHAGE ) 500 MG tablet [id number] Yes Take 1 tablet (500 mg total) by mouth 2 (two) times daily with a meal. [name], [name], FNP  Active   methotrexate  (RHEUMATREX) 2.5 MG tablet [id number] Yes Take 6 tablets by mouth once a week   Active   metoprolol  succinate (TOPROL -XL) 50 MG 24 hr tablet [id number] Yes Take 1 tablet (50 mg total) by mouth daily. Take with or immediately following a meal. Oley Bascom RAMAN, NP  Active   ondansetron  (ZOFRAN ) 4 MG tablet [id number] Yes Take 1 tablet (4 mg total) by mouth every 8 (eight) hours as needed for nausea or vomiting. Oley Bascom RAMAN, NP  Active   rosuvastatin  (CRESTOR ) 40 MG tablet [id number] Yes Take 1 tablet (40 mg total) by mouth daily. Oley Bascom RAMAN, NP  Active             Recommendation:   PCP Follow-up Continue Current Plan of Care  Follow Up Plan:   Telephone follow up appointment date/time:  02/18/2024 @ 11:00 AM   Olam Ku, RN, BSN [place]  [facility], [facility] RN Care Manager Direct Dial: [phone]  Fax: [phone]

## 2024-01-18 NOTE — Patient Instructions (Signed)
 Visit Information  Thank you for taking time to visit with me today. Please don't hesitate to contact me if I can be of assistance to you before our next scheduled appointment.  Your next care management appointment is by telephone on 02/18/2024 at 11:00 AM  Please call the care guide team at [phone] if you need to cancel, schedule, or reschedule an appointment.   Please call the Suicide and Crisis Lifeline: 988 call the [place]  National Suicide Prevention Lifeline: (605)497-5399 or TTY: 260-356-7048 TTY (701)885-2062) to talk to a trained counselor call 1-800-273-TALK (toll free, 24 hour hotline) if you are experiencing a Mental Health or Behavioral Health Crisis or need someone to talk to.   Olam Ku, RN, BSN [place]  [facility], [facility] RN Care Manager Direct Dial: [phone]  Fax: [phone]

## 2024-01-20 ENCOUNTER — Other Ambulatory Visit: Payer: Self-pay

## 2024-01-24 ENCOUNTER — Other Ambulatory Visit: Payer: Self-pay

## 2024-01-27 ENCOUNTER — Other Ambulatory Visit: Payer: Self-pay

## 2024-01-28 ENCOUNTER — Other Ambulatory Visit: Payer: Self-pay

## 2024-01-28 DIAGNOSIS — E118 Type 2 diabetes mellitus with unspecified complications: Secondary | ICD-10-CM

## 2024-01-28 DIAGNOSIS — I10 Essential (primary) hypertension: Secondary | ICD-10-CM

## 2024-01-28 MED ORDER — ROSUVASTATIN CALCIUM 40 MG PO TABS
40.0000 mg | ORAL_TABLET | Freq: Every day | ORAL | 3 refills | Status: AC
  Filled 2024-01-28 – 2024-03-11 (×2): qty 90, 90d supply, fill #0

## 2024-01-28 MED ORDER — "INSULIN SYRINGE 31G X 5/16"" 0.5 ML MISC"
3 refills | Status: AC
  Filled 2024-01-28: qty 100, fill #0
  Filled 2024-04-16: qty 100, 90d supply, fill #0

## 2024-01-28 MED ORDER — HYDROCHLOROTHIAZIDE 25 MG PO TABS
25.0000 mg | ORAL_TABLET | Freq: Every day | ORAL | 1 refills | Status: AC
  Filled 2024-01-28 – 2024-02-13 (×2): qty 90, 90d supply, fill #0
  Filled 2024-05-04: qty 90, 90d supply, fill #1

## 2024-01-28 MED ORDER — FREESTYLE LIBRE 3 PLUS SENSOR MISC
3 refills | Status: AC
  Filled 2024-01-28: qty 2, 30d supply, fill #0

## 2024-01-28 MED ORDER — TRULICITY 0.75 MG/0.5ML ~~LOC~~ SOAJ
0.7500 mg | SUBCUTANEOUS | 5 refills | Status: AC
  Filled 2024-01-28: qty 2, 28d supply, fill #0
  Filled 2024-03-02: qty 2, 28d supply, fill #1
  Filled 2024-03-29: qty 2, 28d supply, fill #2
  Filled 2024-04-26: qty 2, 28d supply, fill #3

## 2024-01-28 MED ORDER — FREESTYLE LIBRE 3 READER DEVI
0 refills | Status: AC
  Filled 2024-01-28: qty 1, 34d supply, fill #0

## 2024-01-28 NOTE — Progress Notes (Signed)
 01/28/2024 Name: Wanda Weaver MRN: [id number] DOB: 11/28/1960  Chief Complaint  Patient presents with   Diabetes    Wanda Weaver is a 63 y.o. year old female who presented for a telephone visit.   They were referred to the pharmacist by their PCP for assistance in managing diabetes. PMH includes T2DM, HLD, HTN, pulmonary sarcoidosis.   Subjective: She was seen by her PCP, Bascom Borer, NP on 09/13/23. BP at that visit was 158/61 mmHg. Patient reported that she was told by neurology to stop metformin , but BG increased > 500 mg/dL and she put herself back on metformin . Patient also reported that Advair , prescribed by pulmonology, increased her BG. At pharmacy telephone appt on 09/20/23, patient was instructed to increase losartan  to 50 mg daily for elevated BP and start Trulicity  0.75 mg weekly. At pharmacy f/u on 11/08/23, patient reported tolerating Trulicity  ok, with some nausea after the injection that she felt was also influenced by methotrexate . She reported an episode of hypoglycemia and was instructed to decrease Lantus  to 30 units daily and continue Trulicity  and metformin . She was seen by PCP on 12/15/23. A1C improved to 9.0% from 9.6%. BP was elevated to 143/80 mmHg. No medication changes were made. At pharmacy telephone appt on 12/22/23, patient continued to have significant n/v with methotrexate . No changes were made, as I encouraged her to outreach prescriber about issues tolerating methotrexate . She saw pulmonology on 01/10/24, who encouraged aggressive glucose control.   Today, patient reports doing ok. She continues to have n/v with methotrexate , mostly on Mon/Tuesdays after her dose. She takes Trulicity  Wednesdays. Blood sugar continues to fluctuate. Reports her appetite is low, but has had some dietary indiscretion and BG excursions.   Care Team: Primary Care Provider: Borer Bascom RAMAN, NP ; Next Scheduled Visit: 03/15/24  Medication Access/Adherence  Current Pharmacy:  [facility]  [address] [place] - [address] [place] [address] Phone: [phone] Fax: [phone]   Patient reports affordability concerns with their medications: Yes  - not working since sarcoidosis dx Patient reports access/transportation concerns to their pharmacy: Yes  - Daughter has to take her to appt.   Patient reports adherence concerns with their medications:  No     Diabetes:  Current medications: Lantus  (vial) 34 units injected once daily, metformin  IR 500 mg BID, Trulicity  0.75 mg weekly (Wednesday)  Medications tried in the past: Jardiance  - consistent burning/itching while urinating; Ozempic  - severe nausea/vomiting. Patient reports that insulin  pen needles are more painful for her to inject than the insulin  syringe/vial.   Reports that she had bruise from Trulicity  this past week (dime sized bruise). She does have nausea/vomiting, but associates it more with her methotrexate  rather than Trulicity  given the timing of the side effect.  Using Accu-Chek glucometer; testing 3 times daily - checks in the AM, noon, and PM (reports that she sometimes eats dinner late around 9-9:30PM and that makes AM sugar higher, around 5:30AM) 01/28/24 11AM: 123 mg/dL (reports she did not eat a whole lot yesterday, had 1 1/2 taco for dinner) 01/27/24: PM 223 mg/dL, AM 826 mg/dL 89/84/74: PM 648 mg/dL, AM 815 mg/dL 89/85/74: AM 808 mg/dL  Reports she had one day her sugar went up to 351 mg/dL after eating a cupcake. Then she was putting away clothes and starting to feel jittery, and it had dropped to ~171 mg/dL within an hour.   Patient denies hypoglycemic s/sx including dizziness, sweating and shakiness. Has history of BG dropping to  27 mg/dL - almost lost consciousness. Had worked 14 hours that day, did not have time to eat. So she does have fear of hypoglycemia.   Denies s/sx of hyperglycemia including polyuria, polydipsia, polyphagia, nocturia, neuropathy,  blurred vision.  Current meal patterns: Reports that her appetite is very reduced with Trulicity . She is not eating snack foods, candy. Reports that she has started using protein shakes. She also bought a TV dinner with protein and was only able to eat half. Reports some days she is only able to eat soup due to nausea. - Drinks - zero sugar juice, sugar free tea, water 4-5 glasses per day  Current physical activity: limited by ShOB - walks to mailbox and back daily, laundry at home. Has cane and walker.   Hypertension:  Current medications: hydrochlorothiazide  25 mg daily, losartan  25 mg daily, metoprolol  succinate 50 mg daily  Patient has a validated, automated, upper arm home BP cuff Current blood pressure readings readings: 116/61 mmHg at home yesterday  Patient denies hypotensive s/sx including dizziness, lightheadedness.  Patient denies hypertensive symptoms including headache, chest pain, shortness of breath   Hyperlipidemia/ASCVD Risk Reduction  Current lipid lowering medications: rosuvastatin  40 mg daily (has to split pill to swallow)  Clinical ASCVD: No  The ASCVD Risk score (Arnett DK, et al., 2019) failed to calculate for the following reasons:   The valid total cholesterol range is 130 to 320 mg/dL    Objective:  BP Readings from Last 3 Encounters:  01/10/24 138/80  12/15/23 (!) 143/80  09/13/23 (!) 158/61     Lab Results  Component Value Date   HGBA1C 9.0 (A) 12/15/2023   HGBA1C 9.6 (A) 09/13/2023   HGBA1C 9.4 (A) 05/24/2023    Lab Results  Component Value Date   CREATININE 1.17 (H) 01/03/2024   BUN 18 01/03/2024   NA 130 (L) 01/03/2024   K 4.5 01/03/2024   CL 94 (L) 01/03/2024   CO2 23 01/03/2024    Lab Results  Component Value Date   CHOL 122 01/03/2024   HDL 44 01/03/2024   LDLCALC 52 01/03/2024   TRIG 154 (H) 01/03/2024   CHOLHDL 2.8 01/03/2024   UACR 06/17/72: 46 mg/g   eGFR 01/19/23 > 47 mL/min  Vitamin D3 07/01/23: < 4 ng/mL - Patient  was told she should not take vitamin D  supplement with sarcoidosis.   Medications Reviewed Today   Medications were not reviewed in this encounter     Assessment/Plan:   Diabetes: - Currently uncontrolled with last A1C of 9.0% above goal < 7%, improved from 9.6% since starting Trulicity . Hesitant to increase Trulicity  with patient reported n/v. FBG this AM was controlled, but they are mostly uncontrolled, which patient attributes to dietary habits. Feel that she would benefit from CGM to determine whether further increase in basal insulin  is needed vs addition of prandial insulin . PA was previously denied by insurance - will attempt to resubmit today as patient meets qualifications. Patient is a good candidate for a GLP-1RA with BMI > 35 and uncontrolled BG, but we continue to closely monitor n/v. If worsens, may consider trial off Trulicity , and consider restarting therapy with Mounjaro which would provide more potent glycemic lowering effect.  With her last eGFR of 47 mL/min, continue to recommend max metformin  dose of 1000 mg per day.  - Reviewed long term cardiovascular and renal outcomes of uncontrolled blood sugar - Reviewed goal A1c, goal fasting, and goal 2 hour post prandial glucose - Reviewed dietary modifications including  eating small meals and snacks throughout the day, staying hydrated with water, and emphasized protein intake with each meal. Advised patient to not go long periods without eating, which could worsen nausea associated with GLP-1 and methotrexate .  - Reviewed lifestyle modifications including: increasing physical activity as able.  - Recommend to continue Lantus  34 units daily - Recommend to continue metformin  1 tablet (500 mg) twice daily for reduced kidney function - Recommend to continue Trulicity  0.75 mg once weekly. Consider trial off to monitor for improvement in nausea/vomiting.  - Patient denies personal or family history of multiple endocrine neoplasia type 2,  medullary thyroid  cancer; personal history of pancreatitis or gallbladder disease. - Recommend to check glucose three times daily with glucometer. Will collaborate with patient advocate to submit PA for FL3+ sensors and reader today. Patient educated on set-up and use, but will also send instructions via MyChart.    Hypertension: - Currently controlled with home BP below goal < 130/80 mmHg. UACR demonstrates microalbuminuria, but patient did not tolerate ARB dose increase due to HA and had GU AE with SGLT2i in the past. Appropriate to continue current regimen. - Reviewed long term cardiovascular and renal outcomes of uncontrolled blood pressure - Reviewed appropriate blood pressure monitoring technique and reviewed goal blood pressure. Recommended to check home blood pressure and heart rate once daily.  - Recommend to continue hydrochlorothiazide  25 mg daily, metoprolol  succinate 50 mg daily, losartan  25 mg daily   Hyperlipidemia/ASCVD Risk Reduction: - Currently controlled with last LDL-C 69 mg/dL below goal < 70 mg/dL. Appropriate to continue high intensity statin. - Reviewed long term complications of uncontrolled cholesterol - Recommend to continue rosuvastatin  40 mg daily  Follow Up Plan: PCP 03/15/24, Pharmacist in person 02/22/24  Lorain Baseman, PharmD [facility] [phone]

## 2024-01-31 ENCOUNTER — Other Ambulatory Visit: Payer: Self-pay

## 2024-01-31 ENCOUNTER — Other Ambulatory Visit: Payer: Self-pay | Admitting: Nurse Practitioner

## 2024-01-31 DIAGNOSIS — M2550 Pain in unspecified joint: Secondary | ICD-10-CM

## 2024-01-31 NOTE — Progress Notes (Signed)
 cbc

## 2024-02-01 ENCOUNTER — Other Ambulatory Visit: Payer: Self-pay

## 2024-02-02 ENCOUNTER — Other Ambulatory Visit

## 2024-02-02 DIAGNOSIS — M2550 Pain in unspecified joint: Secondary | ICD-10-CM

## 2024-02-03 ENCOUNTER — Ambulatory Visit: Payer: Self-pay | Admitting: Nurse Practitioner

## 2024-02-03 LAB — COMPREHENSIVE METABOLIC PANEL WITH GFR
ALT: 36 IU/L — ABNORMAL HIGH (ref 0–32)
AST: 29 IU/L (ref 0–40)
Albumin: 4.1 g/dL (ref 3.9–4.9)
Alkaline Phosphatase: 83 IU/L (ref 49–135)
BUN/Creatinine Ratio: 20 (ref 12–28)
BUN: 22 mg/dL (ref 8–27)
Bilirubin Total: 0.6 mg/dL (ref 0.0–1.2)
CO2: 22 mmol/L (ref 20–29)
Calcium: 9.6 mg/dL (ref 8.7–10.3)
Chloride: 97 mmol/L (ref 96–106)
Creatinine, Ser: 1.11 mg/dL — ABNORMAL HIGH (ref 0.57–1.00)
Globulin, Total: 2.6 g/dL (ref 1.5–4.5)
Glucose: 171 mg/dL — ABNORMAL HIGH (ref 70–99)
Potassium: 4.4 mmol/L (ref 3.5–5.2)
Sodium: 134 mmol/L (ref 134–144)
Total Protein: 6.7 g/dL (ref 6.0–8.5)
eGFR: 56 mL/min/1.73 — ABNORMAL LOW (ref >59)

## 2024-02-03 LAB — CBC
Hematocrit: 31.5 % — ABNORMAL LOW (ref 34.0–46.6)
Hemoglobin: 10.1 g/dL — ABNORMAL LOW (ref 11.1–15.9)
MCH: 28 pg (ref 26.6–33.0)
MCHC: 32.1 g/dL (ref 31.5–35.7)
MCV: 87 fL (ref 79–97)
Platelets: 247 x10E3/uL (ref 150–450)
RBC: 3.61 x10E6/uL — ABNORMAL LOW (ref 3.77–5.28)
RDW: 15.2 % (ref 11.7–15.4)
WBC: 5.2 x10E3/uL (ref 3.4–10.8)

## 2024-02-07 ENCOUNTER — Other Ambulatory Visit: Payer: Self-pay

## 2024-02-08 ENCOUNTER — Other Ambulatory Visit: Payer: Self-pay

## 2024-02-13 ENCOUNTER — Other Ambulatory Visit (HOSPITAL_COMMUNITY): Payer: Self-pay

## 2024-02-14 ENCOUNTER — Other Ambulatory Visit: Payer: Self-pay

## 2024-02-15 ENCOUNTER — Other Ambulatory Visit: Payer: Self-pay

## 2024-02-17 ENCOUNTER — Telehealth: Payer: Self-pay | Admitting: Nurse Practitioner

## 2024-02-17 NOTE — Telephone Encounter (Signed)
 Sent to the provider. KH

## 2024-02-17 NOTE — Telephone Encounter (Signed)
 Copied from CRM #[id number]. Topic: Appointments - Scheduling Inquiry for Clinic >> Feb 15, 2024  3:51 PM Wanda Weaver wrote: Patient called asking if she can have her labs done  for sarcoidosis done on the same day as her pharmacist appt on 11/11

## 2024-02-18 ENCOUNTER — Other Ambulatory Visit: Payer: Self-pay | Admitting: *Deleted

## 2024-02-18 NOTE — Patient Outreach (Signed)
 Complex Care Management   Visit Note  02/18/2024  Name:  Wanda Weaver MRN: [id number] DOB: 22-Jun-1960  Situation: Referral received for Complex Care Management related to Diabetes and Sarcoidosis I obtained verbal consent from Patient.  Visit completed with Patient  on the phone  Background:   Past Medical History:  Diagnosis Date   Bilateral lower extremity edema 07/2019   Bilateral lower extremity pain 07/2019   Diabetes mellitus without complication (HCC)    Hemoglobin A1C greater than 9%, indicating poor diabetic control    Hyperlipidemia    Hypertension    Vitamin D  deficiency     Assessment: Patient Reported Symptoms:  Cognitive Cognitive Status: Able to follow simple commands, Alert and oriented to person, place, and time, Normal speech and language skills   Health Maintenance Behaviors: Annual physical exam Healing Pattern: Average  Neurological Neurological Review of Symptoms: No symptoms reported    HEENT HEENT Symptoms Reported: No symptoms reported HEENT Management Strategies: Coping strategies, Routine screening    Cardiovascular Cardiovascular Symptoms Reported: No symptoms reported Does patient have uncontrolled Hypertension?: Yes Is patient checking Blood Pressure at home?: Yes Patient's Recent BP reading at home: Reports last read 154/80 asymptomatic (stressed with daughter is out of town). Encourage pt to repeat her BP when her daughter returns tomorrow for improvement. Cardiovascular Management Strategies: Coping strategies, Routine screening, Medication therapy, Medical device Weight: 207 lb (93.9 kg) Cardiovascular Self-Management Outcome: 4 (good)  Respiratory Respiratory Symptoms Reported: Shortness of breath Other Respiratory Symptoms: History of SOB due Sarcoidosis with increase activities with ongoing good recovery at rest. Respiratory Management Strategies: Routine screening, Coping strategies Respiratory Self-Management Outcome: 4 (good)  Endocrine  Endocrine Symptoms Reported: No symptoms reported Is patient diabetic?: Yes Is patient checking blood sugars at home?: Yes List most recent blood sugar readings, include date and time of day: Reports last CBG this morning fasting 123 and endocrinologist is reporting 12/15/2023  A1C 9.0% Endocrine Self-Management Outcome: 4 (good)  Gastrointestinal Gastrointestinal Symptoms Reported: Constipation, Diarrhea Additional Gastrointestinal Details: off and on ith with of these symptoms. Pt takes Pepto-Bismol and states it helps. Gastrointestinal Management Strategies: Medication therapy Gastrointestinal Comment: RNCM educated on the agent Pepto-Bismol  contains as aspirin like ingredient that may have thinning effects. Encourage to discuss with her provider prior to taking any additional OTC.    Genitourinary Genitourinary Symptoms Reported: No symptoms reported    Integumentary Integumentary Symptoms Reported: Bruising Additional Integumentary Details: Pt reports taking Pepto-Bismol for her irregularity of bowels. RNCM educated on the agent Pepto-Bismol contains as aspirin like ingredient that may have thinning effects that may result in bruising of the skin. Encourage to discuss with her provider prior to taking any additional OTC including Pepto-Bismol. Skin Management Strategies: Medication therapy, Routine screening  Musculoskeletal Musculoskelatal Symptoms Reviewed: Back pain, Difficulty walking Additional Musculoskeletal Details: Continue to manage her Sarcoidosis and continues to use her assistive devices Musculoskeletal Management Strategies: Routine screening, Medical device Musculoskeletal Self-Management Outcome: 4 (good)      Psychosocial Psychosocial Symptoms Reported: No symptoms reported Behavioral Management Strategies: Coping strategies, Adequate rest, Medication therapy         There were no vitals filed for this visit.  Medications Reviewed Today     Reviewed by Alvia Olam BIRCH, RN (Registered Nurse) on 02/18/24 at 1121  Med List Status: <None>   Medication Order Taking? Sig Documenting Provider Last Dose Status Informant  Accu-Chek Softclix Lancets lancets [id number] Yes Use as instructed [name], [name], NP  Active  albuterol  (VENTOLIN  HFA) 108 (90 Base) MCG/ACT inhaler [id number] Yes Inhale 2 puffs into the lungs every 6 (six) hours as needed. Oley Bascom RAMAN, NP  Active   blood glucose meter kit and supplies KIT [id number] Yes Use up to four times daily as directed. Oley Bascom RAMAN, NP  Active   Continuous Glucose Receiver (FREESTYLE LIBRE 3 READER) DEVI [id number] Yes Use as directed to monitor glucose with freestyle libre 3 plus sensors Oley Bascom RAMAN, NP  Active   Continuous Glucose Sensor (FREESTYLE LIBRE 3 PLUS SENSOR) MISC [id number] Yes Change sensor every 15 days. Oley Bascom RAMAN, NP  Active   Dulaglutide  (TRULICITY ) 0.75 MG/0.5ML [name] [id number] Yes Inject 0.75 mg into the skin once a week. Oley Bascom RAMAN, NP  Active   escitalopram  (LEXAPRO ) 10 MG tablet [id number] Yes Take 1 tablet (10 mg total) by mouth daily. Oley Bascom RAMAN, NP  Active   folic acid  (FOLVITE ) 1 MG tablet [id number] Yes Take 2 tablets (2 mg total) by mouth daily. [name], MD  Active   glucose blood test strip [id number] Yes Use as instructed Oley Bascom RAMAN, NP  Active            Med Note MAYER, [facility] R   Thu Jun 10, 2023 11:19 AM) supply  hydrochlorothiazide  (HYDRODIURIL ) 25 MG tablet [id number] Yes Take 1 tablet (25 mg total) by mouth daily. [name], [name], NP  Active   insulin  glargine (LANTUS ) 100 UNIT/ML injection [id number] Yes Inject 0.34 mLs (34 Units total) into the skin daily. Oley Bascom RAMAN, NP  Active   Insulin  Syringe-Needle U-100 (INSULIN  SYRINGE .5CC/31GX5/16) 31G X 5/16 0.5 ML MISC [id number] Yes Use to inject insulin  daily [name], [name], NP  Active   losartan  (COZAAR ) 25 MG tablet [id number] Yes Take 1 tablet (25 mg total) by mouth daily. Oley Bascom RAMAN, NP  Active   metFORMIN  (GLUCOPHAGE ) 500 MG tablet [id number] Yes Take 1 tablet (500 mg total) by mouth 2 (two) times daily with a meal. [name], [name], FNP  Active   methotrexate  (RHEUMATREX) 2.5 MG tablet [id number] Yes Take 6 tablets by mouth once a week   Active   metoprolol  succinate (TOPROL -XL) 50 MG 24 hr tablet [id number] Yes Take 1 tablet (50 mg total) by mouth daily. Take with or immediately following a meal. Oley Bascom RAMAN, NP  Active   ondansetron  (ZOFRAN ) 4 MG tablet [id number] Yes Take 1 tablet (4 mg total) by mouth every 8 (eight) hours as needed for nausea or vomiting. Oley Bascom RAMAN, NP  Active   rosuvastatin  (CRESTOR ) 40 MG tablet [id number] Yes Take 1 tablet (40 mg total) by mouth daily. Oley Bascom RAMAN, NP  Active             Recommendation:   PCP Follow-up Continue Current Plan of Care  Follow Up Plan:   Telephone follow up appointment date/time:  03/16/2024 @ 3:00 pm   Olam Ku, RN, BSN [place]  [facility], [facility] RN Care Manager Direct Dial: [phone]  Fax: [phone]

## 2024-02-18 NOTE — Patient Instructions (Signed)
 Visit Information  Thank you for taking time to visit with me today. Please don't hesitate to contact me if I can be of assistance to you before our next scheduled appointment.  Your next care management appointment is by telephone on 03/16/2024 at 3:00 pm  Please call the care guide team at [phone] if you need to cancel, schedule, or reschedule an appointment.   Please call the Suicide and Crisis Lifeline: 988 call the [place]  National Suicide Prevention Lifeline: 418 638 4381 or TTY: 320-669-4853 TTY 414-827-4389) to talk to a trained counselor call 1-800-273-TALK (toll free, 24 hour hotline) if you are experiencing a Mental Health or Behavioral Health Crisis or need someone to talk to.   Olam Ku, RN, BSN [place]  [facility], [facility] RN Care Manager Direct Dial: [phone]  Fax: [phone]

## 2024-02-21 ENCOUNTER — Other Ambulatory Visit: Payer: Self-pay

## 2024-02-22 ENCOUNTER — Telehealth: Payer: Self-pay

## 2024-02-22 ENCOUNTER — Ambulatory Visit: Payer: Self-pay

## 2024-02-22 NOTE — Telephone Encounter (Signed)
 Attempted to contact patient for scheduled appointment for medication management. Left HIPAA compliant message for patient to return my call at their convenience.   Lorain Baseman, PharmD [facility] [phone]

## 2024-02-23 ENCOUNTER — Other Ambulatory Visit: Payer: Self-pay

## 2024-02-24 ENCOUNTER — Telehealth: Payer: Self-pay

## 2024-02-24 NOTE — Progress Notes (Signed)
 Complex Care Management Care Guide Note  02/24/2024 Name: Sidra Oldfield MRN: [id number] DOB: 1960/05/31  Ellamae Lybeck is a 63 y.o. year old female who is a primary care patient of Oley Bascom RAMAN, NP and is actively engaged with the care management team. I reached out to [name] by phone today to assist with re-scheduling  with the Pharmacist.  Follow up plan: Unsuccessful telephone outreach attempt made. A HIPAA compliant phone message was left for the patient providing contact information and requesting a return call.  Leotis Rase [facility], [facility] Guide  Direct Dial: [phone]  Fax [phone]

## 2024-02-25 ENCOUNTER — Other Ambulatory Visit: Payer: Self-pay

## 2024-02-25 MED ORDER — METHOTREXATE SODIUM 2.5 MG PO TABS
15.0000 mg | ORAL_TABLET | ORAL | 2 refills | Status: AC
  Filled 2024-02-25: qty 24, 28d supply, fill #0
  Filled 2024-03-14 – 2024-03-17 (×2): qty 24, 28d supply, fill #1

## 2024-03-02 ENCOUNTER — Telehealth: Payer: Self-pay

## 2024-03-02 ENCOUNTER — Other Ambulatory Visit: Payer: Self-pay

## 2024-03-02 ENCOUNTER — Other Ambulatory Visit: Payer: Self-pay | Admitting: Nurse Practitioner

## 2024-03-02 DIAGNOSIS — E785 Hyperlipidemia, unspecified: Secondary | ICD-10-CM

## 2024-03-02 DIAGNOSIS — E118 Type 2 diabetes mellitus with unspecified complications: Secondary | ICD-10-CM

## 2024-03-02 DIAGNOSIS — D862 Sarcoidosis of lung with sarcoidosis of lymph nodes: Secondary | ICD-10-CM

## 2024-03-02 DIAGNOSIS — I10 Essential (primary) hypertension: Secondary | ICD-10-CM

## 2024-03-02 NOTE — Telephone Encounter (Signed)
 Copied from CRM [id number]. Topic: Clinical - Request for Lab/Test Order >> Mar 02, 2024 11:09 AM Antwanette L wrote: Reason for CRM:  Patient called regarding her upcoming lab appointment scheduled for 11/21 at 9:10 AM. [facility] has uploaded the lab order to the patient's chart and provider. The labs include methotrexate  monitoring and a metabolic panel.The lab order is valid from 12/20/23 through 03/17/24. Patient is requesting a callback at [phone]   Done.kh

## 2024-03-03 ENCOUNTER — Other Ambulatory Visit: Payer: Self-pay

## 2024-03-03 DIAGNOSIS — E785 Hyperlipidemia, unspecified: Secondary | ICD-10-CM

## 2024-03-03 DIAGNOSIS — D862 Sarcoidosis of lung with sarcoidosis of lymph nodes: Secondary | ICD-10-CM

## 2024-03-03 DIAGNOSIS — E118 Type 2 diabetes mellitus with unspecified complications: Secondary | ICD-10-CM

## 2024-03-03 DIAGNOSIS — I10 Essential (primary) hypertension: Secondary | ICD-10-CM

## 2024-03-04 LAB — COMPREHENSIVE METABOLIC PANEL WITH GFR
ALT: 28 IU/L (ref 0–32)
AST: 19 IU/L (ref 0–40)
Albumin: 4.3 g/dL (ref 3.9–4.9)
Alkaline Phosphatase: 85 IU/L (ref 49–135)
BUN/Creatinine Ratio: 17 (ref 12–28)
BUN: 19 mg/dL (ref 8–27)
Bilirubin Total: 0.5 mg/dL (ref 0.0–1.2)
CO2: 24 mmol/L (ref 20–29)
Calcium: 9.6 mg/dL (ref 8.7–10.3)
Chloride: 94 mmol/L — ABNORMAL LOW (ref 96–106)
Creatinine, Ser: 1.14 mg/dL — ABNORMAL HIGH (ref 0.57–1.00)
Globulin, Total: 2.4 g/dL (ref 1.5–4.5)
Glucose: 200 mg/dL — ABNORMAL HIGH (ref 70–99)
Potassium: 4.6 mmol/L (ref 3.5–5.2)
Sodium: 131 mmol/L — ABNORMAL LOW (ref 134–144)
Total Protein: 6.7 g/dL (ref 6.0–8.5)
eGFR: 54 mL/min/1.73 — ABNORMAL LOW (ref >59)

## 2024-03-04 LAB — CBC WITH DIFFERENTIAL/PLATELET
Basophils Absolute: 0.1 x10E3/uL (ref 0.0–0.2)
Basos: 1 %
EOS (ABSOLUTE): 0.5 x10E3/uL — ABNORMAL HIGH (ref 0.0–0.4)
Eos: 8 %
Hematocrit: 32.5 % — ABNORMAL LOW (ref 34.0–46.6)
Hemoglobin: 10.2 g/dL — ABNORMAL LOW (ref 11.1–15.9)
Immature Grans (Abs): 0.1 x10E3/uL (ref 0.0–0.1)
Immature Granulocytes: 1 %
Lymphocytes Absolute: 0.8 x10E3/uL (ref 0.7–3.1)
Lymphs: 12 %
MCH: 28.4 pg (ref 26.6–33.0)
MCHC: 31.4 g/dL — ABNORMAL LOW (ref 31.5–35.7)
MCV: 91 fL (ref 79–97)
Monocytes Absolute: 0.4 x10E3/uL (ref 0.1–0.9)
Monocytes: 6 %
Neutrophils Absolute: 4.8 x10E3/uL (ref 1.4–7.0)
Neutrophils: 72 %
Platelets: 257 x10E3/uL (ref 150–450)
RBC: 3.59 x10E6/uL — ABNORMAL LOW (ref 3.77–5.28)
RDW: 15.4 % (ref 11.7–15.4)
WBC: 6.6 x10E3/uL (ref 3.4–10.8)

## 2024-03-06 ENCOUNTER — Other Ambulatory Visit: Payer: Self-pay

## 2024-03-06 ENCOUNTER — Ambulatory Visit: Payer: Self-pay | Admitting: Nurse Practitioner

## 2024-03-06 ENCOUNTER — Encounter: Payer: Self-pay | Admitting: Oncology

## 2024-03-11 ENCOUNTER — Other Ambulatory Visit: Payer: Self-pay

## 2024-03-13 ENCOUNTER — Other Ambulatory Visit: Payer: Self-pay

## 2024-03-14 ENCOUNTER — Other Ambulatory Visit: Payer: Self-pay

## 2024-03-15 ENCOUNTER — Ambulatory Visit: Payer: Self-pay | Admitting: Nurse Practitioner

## 2024-03-15 ENCOUNTER — Encounter: Payer: Self-pay | Admitting: Nurse Practitioner

## 2024-03-15 ENCOUNTER — Other Ambulatory Visit: Payer: Self-pay

## 2024-03-15 VITALS — BP 175/71 | HR 77 | Wt 210.5 lb

## 2024-03-15 DIAGNOSIS — N289 Disorder of kidney and ureter, unspecified: Secondary | ICD-10-CM

## 2024-03-15 MED ORDER — FERROUS SULFATE 90 (18 FE) MG PO TABS: 1.0000 | ORAL_TABLET | Freq: Every day | ORAL | 0 refills | Status: AC

## 2024-03-15 NOTE — Progress Notes (Signed)
 Subjective   Patient ID: Wanda Weaver, female    DOB: 1961/04/08, 63 y.o.   MRN: [id number]  Chief Complaint  Patient presents with   Diabetes    Referring provider: Oley Bascom RAMAN, NP  Wanda Weaver is a 63 y.o. female with Past Medical History: 07/2019: Bilateral lower extremity edema 07/2019: Bilateral lower extremity pain No date: Diabetes mellitus without complication (HCC) No date: Hemoglobin A1C greater than 9%, indicating poor diabetic  control No date: Hyperlipidemia No date: Hypertension No date: Vitamin D  deficiency   HPI  Patient presents today for follow-up on diabetes and hypertension.  She has followed with pharmacy since her last visit here.  She does need a recheck on lipid panel and A1c today. A1C in office today is 8.9.  Patient is followed by pulmonary for sarcoidosis and neurology. Overall she has been stable since her last visit here. Denies f/c/s, n/v/d, hemoptysis, PND, leg swelling. Denies chest pain or edema.      Allergies  Allergen Reactions   Ibuprofen  Palpitations    Immunization History  Administered Date(s) Administered   Pneumococcal Polysaccharide-23 08/06/2017    Tobacco History: Social History   Tobacco Use  Smoking Status Never  Smokeless Tobacco Never   Counseling given: Not Answered   Outpatient Encounter Medications as of 03/15/2024  Medication Sig   Accu-Chek Softclix Lancets lancets Use as instructed   albuterol  (VENTOLIN  HFA) 108 (90 Base) MCG/ACT inhaler Inhale 2 puffs into the lungs every 6 (six) hours as needed.   blood glucose meter kit and supplies KIT Use up to four times daily as directed.   Continuous Glucose Receiver (FREESTYLE LIBRE 3 READER) DEVI Use as directed to monitor glucose with freestyle libre 3 plus sensors   Continuous Glucose Sensor (FREESTYLE LIBRE 3 PLUS SENSOR) MISC Change sensor every 15 days.   Dulaglutide  (TRULICITY ) 0.75 MG/0.5ML SOAJ Inject 0.75 mg into the skin once a week.   escitalopram   (LEXAPRO ) 10 MG tablet Take 1 tablet (10 mg total) by mouth daily.   Ferrous Sulfate  90 (18 Fe) MG TABS Take 1 tablet by mouth daily.   folic acid  (FOLVITE ) 1 MG tablet Take 2 tablets (2 mg total) by mouth daily.   glucose blood test strip Use as instructed   hydrochlorothiazide  (HYDRODIURIL ) 25 MG tablet Take 1 tablet (25 mg total) by mouth daily.   insulin  glargine (LANTUS ) 100 UNIT/ML injection Inject 0.34 mLs (34 Units total) into the skin daily.   Insulin  Syringe-Needle U-100 (INSULIN  SYRINGE .5CC/31GX5/16) 31G X 5/16 0.5 ML MISC Use to inject insulin  daily   losartan  (COZAAR ) 25 MG tablet Take 1 tablet (25 mg total) by mouth daily.   metFORMIN  (GLUCOPHAGE ) 500 MG tablet Take 1 tablet (500 mg total) by mouth 2 (two) times daily with a meal.   methotrexate  (RHEUMATREX) 2.5 MG tablet Take 6 tablets by mouth once a week   metoprolol  succinate (TOPROL -XL) 50 MG 24 hr tablet Take 1 tablet (50 mg total) by mouth daily. Take with or immediately following a meal.   ondansetron  (ZOFRAN ) 4 MG tablet Take 1 tablet (4 mg total) by mouth every 8 (eight) hours as needed for nausea or vomiting.   rosuvastatin  (CRESTOR ) 40 MG tablet Take 1 tablet (40 mg total) by mouth daily.   No facility-administered encounter medications on file as of 03/15/2024.    Review of Systems  Review of Systems  Constitutional: Negative.   HENT: Negative.    Cardiovascular: Negative.   Gastrointestinal: Negative.  Allergic/Immunologic: Negative.   Neurological: Negative.   Psychiatric/Behavioral: Negative.       Objective:   BP (!) 175/71 (BP Location: Left Arm, Patient Position: Sitting, Cuff Size: Large)   Pulse 77   Wt 210 lb 8 oz (95.5 kg)   SpO2 100%   BMI 38.50 kg/m   Wt Readings from Last 5 Encounters:  03/15/24 210 lb 8 oz (95.5 kg)  02/18/24 207 lb (93.9 kg)  01/18/24 210 lb (95.3 kg)  01/10/24 210 lb 4.8 oz (95.4 kg)  12/30/23 206 lb (93.4 kg)     Physical Exam Vitals and nursing note  reviewed.  Constitutional:      General: She is not in acute distress.    Appearance: She is well-developed.  Cardiovascular:     Rate and Rhythm: Normal rate and regular rhythm.  Pulmonary:     Effort: Pulmonary effort is normal.     Breath sounds: Normal breath sounds.  Neurological:     Mental Status: She is alert and oriented to person, place, and time.       Assessment & Plan:   Abnormal kidney function -     Ambulatory referral to Nephrology  Other orders -     Ferrous Sulfate; Take 1 tablet by mouth daily.  Dispense: 30 tablet; Refill: 0     Return in about 3 months (around 06/13/2024).     Bascom GORMAN Borer, NP 03/15/2024

## 2024-03-16 ENCOUNTER — Other Ambulatory Visit: Payer: Self-pay

## 2024-03-16 ENCOUNTER — Other Ambulatory Visit: Payer: Self-pay | Admitting: *Deleted

## 2024-03-16 NOTE — Patient Outreach (Signed)
 Complex Care Management   Visit Note  03/16/2024  Name:  Wanda Weaver MRN: [id number] DOB: 04-10-1961  Situation: Referral received for Complex Care Management related to Diabetes and Sarcoidosis. I obtained verbal consent from Patient.  Visit completed with Patient  on the phone  Background:   Past Medical History:  Diagnosis Date   Bilateral lower extremity edema 07/2019   Bilateral lower extremity pain 07/2019   Diabetes mellitus without complication (HCC)    Hemoglobin A1C greater than 9%, indicating poor diabetic control    Hyperlipidemia    Hypertension    Vitamin D  deficiency     Assessment: 03/15/2025 with last A1C at 8.0% reported by patient via PCP office.  Goal to reduce A1C <7 %. Pt remains adherent with her dietary measures and daily CBG. No acute symptoms today as pt continue to manager her ongoing health. Patient Reported Symptoms:  Cognitive Cognitive Status: Able to follow simple commands, Alert and oriented to person, place, and time, Normal speech and language skills   Health Maintenance Behaviors: Annual physical exam Healing Pattern: Average  Neurological Neurological Review of Symptoms: No symptoms reported Neurological Management Strategies: Adequate rest, Medication therapy  HEENT HEENT Symptoms Reported: No symptoms reported HEENT Management Strategies: Medication therapy, Coping strategies, Routine screening    Cardiovascular Cardiovascular Symptoms Reported: No symptoms reported Does patient have uncontrolled Hypertension?: Yes Is patient checking Blood Pressure at home?: Yes Patient's Recent BP reading at home: Reports last read at 175/71 Pt pending referral to nephrologist due to medication for her existing dx. Cardiovascular Management Strategies: Coping strategies, Routine screening, Medication therapy Weight: 207 lb (93.9 kg) Cardiovascular Self-Management Outcome: 4 (good)  Respiratory Respiratory Symptoms Reported: Shortness of breath Other  Respiratory Symptoms: History of SOB due Sarcoidosis with increase activities with ongoing good recovery at rest. Respiratory Management Strategies: Routine screening, Coping strategies Respiratory Self-Management Outcome: 4 (good)  Endocrine Endocrine Symptoms Reported: No symptoms reported Is patient diabetic?: Yes Is patient checking blood sugars at home?: Yes List most recent blood sugar readings, include date and time of day: Reports last CBG this morning at 200 (consumes a late dinner last night). On Libre system on average 100-110. A1C 02/24/2024 read at 8:0% Endocrine Self-Management Outcome: 4 (good)  Gastrointestinal Gastrointestinal Symptoms Reported: Constipation Additional Gastrointestinal Details: Takes Ex-lax when needed MD aware. Pt aware to contact her provider with delayed bowels. Also educated on Miralax and other laxatives that are OTC. Gastrointestinal Management Strategies: Medication therapy    Genitourinary Genitourinary Symptoms Reported: No symptoms reported Genitourinary Management Strategies: Adequate rest  Integumentary Integumentary Symptoms Reported: Bruising Additional Integumentary Details: Reports recent lab draw to her finger on her right index with bruising from finger stick at the provider's office. Pt aware to report to provider if it continue to get worsen Skin Management Strategies: Medication therapy, Routine screening, Coping strategies Skin Self-Management Outcome: 4 (good)  Musculoskeletal Musculoskelatal Symptoms Reviewed: Difficulty walking, Back pain Additional Musculoskeletal Details: Continue to manage her Sarcoidosis and continues to use her assistive devices Musculoskeletal Management Strategies: Routine screening, Medication therapy, Medical device Musculoskeletal Self-Management Outcome: 4 (good)      Psychosocial Psychosocial Symptoms Reported: No symptoms reported          Today's Vitals   03/16/24 1523  Weight: 207 lb (93.9 kg)    Pain Scale: 0-10 Pain Score: 0-No pain  Medications Reviewed Today     Reviewed by Alvia Olam BIRCH, RN (Registered Nurse) on 03/16/24 at 1507  Med List Status: <None>  Medication Order Taking? Sig Documenting Provider Last Dose Status Informant  Accu-Chek Softclix Lancets lancets [id number] Yes Use as instructed [name], [name], NP  Active   albuterol  (VENTOLIN  HFA) 108 (90 Base) MCG/ACT inhaler [id number] Yes Inhale 2 puffs into the lungs every 6 (six) hours as needed. Oley Bascom RAMAN, NP  Active   blood glucose meter kit and supplies KIT [id number] Yes Use up to four times daily as directed. Oley Bascom RAMAN, NP  Active   Continuous Glucose Receiver (FREESTYLE LIBRE 3 READER) DEVI [id number] Yes Use as directed to monitor glucose with freestyle libre 3 plus sensors Oley Bascom RAMAN, NP  Active   Continuous Glucose Sensor (FREESTYLE LIBRE 3 PLUS SENSOR) MISC [id number] Yes Change sensor every 15 days. Oley Bascom RAMAN, NP  Active   Dulaglutide  (TRULICITY ) 0.75 MG/0.5ML [name] [id number] Yes Inject 0.75 mg into the skin once a week. Oley Bascom RAMAN, NP  Active   escitalopram  (LEXAPRO ) 10 MG tablet [id number] Yes Take 1 tablet (10 mg total) by mouth daily. Oley Bascom RAMAN, NP  Active   Ferrous Sulfate  90 (18 Fe) MG TABS [id number] Yes Take 1 tablet by mouth daily. Oley Bascom RAMAN, NP  Active   folic acid  (FOLVITE ) 1 MG tablet [id number] Yes Take 2 tablets (2 mg total) by mouth daily. [name], MD  Active   glucose blood test strip [id number] Yes Use as instructed Oley Bascom RAMAN, NP  Active            Med Note MAYER, [facility]   Thu Jun 10, 2023 11:19 AM) supply  hydrochlorothiazide  (HYDRODIURIL ) 25 MG tablet [id number] Yes Take 1 tablet (25 mg total) by mouth daily. [name], [name], NP  Active   insulin  glargine (LANTUS ) 100 UNIT/ML injection [id number] Yes Inject 0.34 mLs (34 Units total) into the skin daily. Oley Bascom RAMAN, NP  Active   Insulin  Syringe-Needle U-100 (INSULIN  SYRINGE  .5CC/31GX5/16) 31G X 5/16 0.5 ML MISC [id number] Yes Use to inject insulin  daily [name], [name], NP  Active   losartan  (COZAAR ) 25 MG tablet [id number] Yes Take 1 tablet (25 mg total) by mouth daily. Oley Bascom RAMAN, NP  Active   metFORMIN  (GLUCOPHAGE ) 500 MG tablet [id number] Yes Take 1 tablet (500 mg total) by mouth 2 (two) times daily with a meal. [name], [name], FNP  Active   methotrexate  (RHEUMATREX) 2.5 MG tablet [id number] Yes Take 6 tablets by mouth once a week   Active   metoprolol  succinate (TOPROL -XL) 50 MG 24 hr tablet [id number] Yes Take 1 tablet (50 mg total) by mouth daily. Take with or immediately following a meal. Oley Bascom RAMAN, NP  Active   ondansetron  (ZOFRAN ) 4 MG tablet [id number] Yes Take 1 tablet (4 mg total) by mouth every 8 (eight) hours as needed for nausea or vomiting. Oley Bascom RAMAN, NP  Active   rosuvastatin  (CRESTOR ) 40 MG tablet [id number] Yes Take 1 tablet (40 mg total) by mouth daily. Oley Bascom RAMAN, NP  Active             Recommendation:   PCP Follow-up Continue Current Plan of Care  Follow Up Plan:   Telephone follow up appointment date/time:  04/17/2024 @ 2:30 pm   Olam Ku, RN, BSN [place]  [facility], [facility] RN Care Manager Direct Dial: [phone]  Fax: [phone]

## 2024-03-16 NOTE — Patient Instructions (Signed)
 Visit Information  Thank you for taking time to visit with me today. Please don't hesitate to contact me if I can be of assistance to you before our next scheduled appointment.  Your next care management appointment is by telephone on 04/17/2024 at 2:30 PM  Please call the care guide team at [phone] if you need to cancel, schedule, or reschedule an appointment.   Please call the Suicide and Crisis Lifeline: 988 call the [place]  National Suicide Prevention Lifeline: 203-776-2585 or TTY: (276) 866-7452 TTY 662-666-6880) to talk to a trained counselor call 1-800-273-TALK (toll free, 24 hour hotline) if you are experiencing a Mental Health or Behavioral Health Crisis or need someone to talk to.   Olam Ku, RN, BSN   [facility], [facility] RN Care Manager Direct Dial: [phone]  Fax: [phone]

## 2024-03-21 ENCOUNTER — Telehealth (HOSPITAL_BASED_OUTPATIENT_CLINIC_OR_DEPARTMENT_OTHER): Payer: Self-pay

## 2024-03-21 ENCOUNTER — Other Ambulatory Visit: Payer: Self-pay

## 2024-03-21 NOTE — Telephone Encounter (Signed)
 Attempted to call back. Straight to voicemail. Left personal cell phone number for callback.

## 2024-03-21 NOTE — Telephone Encounter (Signed)
 Discussed care with Dr. Juli, [facility]. Patient currently not tolerating methotrexate  due to nausea and GI symptoms. Plan to discontinue. From pulmonary standpoint, patient initially had worsening dyspnea but improved on methotrexate . Ok to discontinue and monitor respiratory symptoms.  Previously not able to tolerate ICS/LABA due to hyperglycemia in setting of DM2. So will need to consider alternative modes of treating respiratory symptoms if they occur. Planned for follow-up in March for PFTs.

## 2024-03-21 NOTE — Telephone Encounter (Signed)
 Copied from CRM #[id number]. Topic: Clinical - Medical Advice >> Mar 21, 2024  2:36 PM Rozanna MATSU wrote: Reason for CRM: Dr. Debbrah Dine with [facility]  [phone], is requesting Dr Kassie give her a call about this patient as soon as possible. Attempted to call [facility] several times no answer. Sorry

## 2024-03-22 ENCOUNTER — Other Ambulatory Visit: Payer: Self-pay

## 2024-03-29 ENCOUNTER — Telehealth: Payer: Self-pay | Admitting: Nurse Practitioner

## 2024-03-29 ENCOUNTER — Other Ambulatory Visit: Payer: Self-pay | Admitting: Nurse Practitioner

## 2024-03-29 NOTE — Telephone Encounter (Signed)
 Copied from CRM #[id number]. Topic: Clinical - Request for Lab/Test Order >> Mar 29, 2024  8:16 AM Franky GRADE wrote: Reason for CRM: Patient calling to schedule her monthly lab appointment; however no active orders on file.

## 2024-04-03 ENCOUNTER — Other Ambulatory Visit: Payer: Self-pay

## 2024-04-03 MED ORDER — ACCU-CHEK GUIDE TEST VI STRP
ORAL_STRIP | 12 refills | Status: AC
  Filled 2024-04-03: qty 100, 25d supply, fill #0
  Filled 2024-04-16 – 2024-04-26 (×3): qty 100, 25d supply, fill #1

## 2024-04-04 ENCOUNTER — Other Ambulatory Visit: Payer: Self-pay

## 2024-04-04 DIAGNOSIS — E118 Type 2 diabetes mellitus with unspecified complications: Secondary | ICD-10-CM

## 2024-04-05 LAB — CBC
Hematocrit: 31.7 % — ABNORMAL LOW (ref 34.0–46.6)
Hemoglobin: 9.7 g/dL — ABNORMAL LOW (ref 11.1–15.9)
MCH: 27.6 pg (ref 26.6–33.0)
MCHC: 30.6 g/dL — ABNORMAL LOW (ref 31.5–35.7)
MCV: 90 fL (ref 79–97)
Platelets: 233 x10E3/uL (ref 150–450)
RBC: 3.52 x10E6/uL — ABNORMAL LOW (ref 3.77–5.28)
RDW: 14.7 % (ref 11.7–15.4)
WBC: 6.1 x10E3/uL (ref 3.4–10.8)

## 2024-04-07 ENCOUNTER — Ambulatory Visit: Payer: Self-pay | Admitting: Nurse Practitioner

## 2024-04-07 NOTE — Telephone Encounter (Signed)
 Please advise

## 2024-04-07 NOTE — Telephone Encounter (Signed)
 Copied from CRM #[id number]. Topic: Clinical - Medication Question >> Apr 07, 2024 11:42 AM Delon DASEN wrote: Reason for CRM: Patient wants to start taking some over the counter vitamins that have vitamin D  in it instead of the Vitamin D  prescription, unable to swallow the Vitamin D  pills- [phone]

## 2024-04-10 ENCOUNTER — Other Ambulatory Visit: Payer: Self-pay

## 2024-04-12 ENCOUNTER — Other Ambulatory Visit: Payer: Self-pay

## 2024-04-17 ENCOUNTER — Other Ambulatory Visit: Payer: Self-pay | Admitting: *Deleted

## 2024-04-17 ENCOUNTER — Other Ambulatory Visit: Payer: Self-pay

## 2024-04-17 NOTE — Patient Instructions (Signed)
 Visit Information  Thank you for taking time to visit with me today. Please don't hesitate to contact me if I can be of assistance to you before our next scheduled appointment.  Your next care management appointment is by telephone on 05/18/2024 at 2:00 pm with Warren Quivers, [facility]  Please call the care guide team at [phone] if you need to cancel, schedule, or reschedule an appointment.   Please call the Suicide and Crisis Lifeline: 988 call the [place]  National Suicide Prevention Lifeline: 704-590-5963 or TTY: 240-513-7957 TTY 860-729-0231) to talk to a trained counselor call 1-800-273-TALK (toll free, 24 hour hotline) if you are experiencing a Mental Health or Behavioral Health Crisis or need someone to talk to.   Olam Ku, RN, BSN [place]  [facility], [facility] RN Care Manager Direct Dial: [phone]  Fax: [phone]

## 2024-04-17 NOTE — Patient Outreach (Signed)
 Complex Care Management   Visit Note  04/17/2024  Name:  Wanda Weaver MRN: [id number] DOB: 23-Feb-1961  Situation: Referral received for Complex Care Management related to DMII. I obtained verbal consent from Patient.  Visit completed with Patient  on the phone  Background:   Past Medical History:  Diagnosis Date   Bilateral lower extremity edema 07/2019   Bilateral lower extremity pain 07/2019   Diabetes mellitus without complication (HCC)    Hemoglobin A1C greater than 9%, indicating poor diabetic control    Hyperlipidemia    Hypertension    Vitamin D  deficiency     Assessment: Pt out of insulin  since Sat and will obtain at her local pharmacy on 04/19/2024 Wed morning. Pt aware when to contact her provider if symptomatic. Currently at 189 fasting CBG this AM with the highest glucose read at 315 usually below 300 CBG. Pt continue to take her Metformin  with attempts to eat a healthy diet.  Patient Reported Symptoms:  Cognitive Cognitive Status: Able to follow simple commands, Alert and oriented to person, place, and time, Normal speech and language skills   Health Maintenance Behaviors: Annual physical exam Healing Pattern: Average Health Facilitated by: Healthy diet, Rest  Neurological Neurological Review of Symptoms: No symptoms reported Neurological Management Strategies: Adequate rest, Routine screening Neurological Self-Management Outcome: 4 (good)  HEENT HEENT Symptoms Reported: No symptoms reported HEENT Management Strategies: Coping strategies, Routine screening HEENT Self-Management Outcome: 4 (good)    Cardiovascular Cardiovascular Symptoms Reported: No symptoms reported Does patient have uncontrolled Hypertension?: Yes Is patient checking Blood Pressure at home?: Yes Patient's Recent BP reading at home: Reports bp last read at 178/83 and pt remains asymptomatic. Cardiovascular Management Strategies: Coping strategies, Routine screening, Medication therapy Weight: 204 lb  (92.5 kg) Cardiovascular Self-Management Outcome: 4 (good)  Respiratory Respiratory Symptoms Reported: Shortness of breath Other Respiratory Symptoms: History of SOB due Sarcoidosis with increase activities with ongoing good recovery at rest. Respiratory Management Strategies: Coping strategies, Routine screening, Adequate rest Respiratory Self-Management Outcome: 4 (good)  Endocrine Endocrine Symptoms Reported: No symptoms reported Is patient diabetic?: Yes Is patient checking blood sugars at home?: Yes List most recent blood sugar readings, include date and time of day: Reports last CBG at 189 fasting this AM with a high of 315. Pt states she has been out of her insulin  since Sat delayed by insurance coverage but able to obtain on 1/7 from her local pharmacy. Pt continues to take her Metformin . A1C currently at 8.0% on 02/24/2024 Endocrine Self-Management Outcome: 4 (good)  Gastrointestinal Gastrointestinal Symptoms Reported: No symptoms reported Gastrointestinal Management Strategies: Coping strategies, Medication therapy Gastrointestinal Self-Management Outcome: 4 (good) Gastrointestinal Comment: Pt reports taking the agent Pepto-Bismol if needed with the awareness ofpossbile side effects. Encourage to discuss with her provider prior to taking any additional OTC.    Genitourinary Genitourinary Symptoms Reported: No symptoms reported Genitourinary Management Strategies: Adequate rest, Coping strategies Genitourinary Self-Management Outcome: 4 (good)  Integumentary Integumentary Symptoms Reported: No symptoms reported Skin Management Strategies: Routine screening, Medication therapy, Coping strategies Skin Self-Management Outcome: 4 (good)  Musculoskeletal Musculoskelatal Symptoms Reviewed: Difficulty walking, Back pain Additional Musculoskeletal Details: Continue to manage her Sarcoidosis and continues to use her assistive devices Musculoskeletal Management Strategies: Coping strategies,  Medication therapy, Medical device Musculoskeletal Self-Management Outcome: 4 (good)      Psychosocial Psychosocial Symptoms Reported: No symptoms reported Behavioral Management Strategies: Adequate rest, Coping strategies Behavioral Health Self-Management Outcome: 4 (good)        Today's Vitals  04/17/24 1500  Weight: 204 lb (92.5 kg)   Pain Score: 6  Pain Type: Chronic pain Pain Location: Leg Pain Orientation: Right Pain Descriptors / Indicators: Throbbing (Pt has visit the ED to verify with ultrasound with no findings of clots. Pt reports same pain that is ongoing. Educated pt if pain gets worse to seek medical intervention immediately.) Pain Onset: On-going Patients Stated Pain Goal: 0 Pain Intervention(s): Medication (See eMAR), Hot/Cold interventions, Rest, Elevated extremity, Repositioned  Medications Reviewed Today     Reviewed by Alvia Olam BIRCH, RN (Registered Nurse) on 04/17/24 at 1443  Med List Status: <None>   Medication Order Taking? Sig Documenting Provider Last Dose Status Informant  Accu-Chek Softclix Lancets lancets [id number] Yes Use as instructed Oley Bascom RAMAN, NP  Active   albuterol  (VENTOLIN  HFA) 108 (90 Base) MCG/ACT inhaler [id number] Yes Inhale 2 puffs into the lungs every 6 (six) hours as needed. Oley Bascom RAMAN, NP  Active   blood glucose meter kit and supplies KIT [id number] Yes Use up to four times daily as directed. Oley Bascom RAMAN, NP  Active   Continuous Glucose Receiver (FREESTYLE LIBRE 3 READER) DEVI [id number] Yes Use as directed to monitor glucose with freestyle libre 3 plus sensors Oley Bascom RAMAN, NP  Active   Continuous Glucose Sensor (FREESTYLE LIBRE 3 PLUS SENSOR) MISC [id number] Yes Change sensor every 15 days. Oley Bascom RAMAN, NP  Active   Dulaglutide  (TRULICITY ) 0.75 MG/0.5ML [name] [id number] Yes Inject 0.75 mg into the skin once a week. Oley Bascom RAMAN, NP  Active   escitalopram  (LEXAPRO ) 10 MG tablet [id number] Yes Take 1 tablet (10  mg total) by mouth daily. Oley Bascom RAMAN, NP  Active   Ferrous Sulfate  90 (18 Fe) MG TABS [id number] Yes Take 1 tablet by mouth daily. Oley Bascom RAMAN, NP  Active   folic acid  (FOLVITE ) 1 MG tablet [id number] Yes Take 2 tablets (2 mg total) by mouth daily. [name], MD  Active   glucose blood (ACCU-CHEK GUIDE TEST) test strip [id number] Yes Use as instructed [name], [name], NP  Active   hydrochlorothiazide  (HYDRODIURIL ) 25 MG tablet [id number] Yes Take 1 tablet (25 mg total) by mouth daily. [name], [name], NP  Active   insulin  glargine (LANTUS ) 100 UNIT/ML injection [id number]  Inject 0.34 mLs (34 Units total) into the skin daily.  Patient not taking: Reported on 04/17/2024   Oley Bascom RAMAN, NP  Active   Insulin  Syringe-Needle U-100 (INSULIN  SYRINGE .5CC/31GX5/16) 31G X 5/16 0.5 ML MISC [id number] Yes Use to inject insulin  daily Oley Bascom RAMAN, NP  Active   losartan  (COZAAR ) 25 MG tablet [id number] Yes Take 1 tablet (25 mg total) by mouth daily. Oley Bascom RAMAN, NP  Active   metFORMIN  (GLUCOPHAGE ) 500 MG tablet [id number] Yes Take 1 tablet (500 mg total) by mouth 2 (two) times daily with a meal. [name], [name], FNP  Active   methotrexate  (RHEUMATREX) 2.5 MG tablet [id number]  Take 6 tablets by mouth once a week  Patient not taking: Reported on 04/17/2024     Active   metoprolol  succinate (TOPROL -XL) 50 MG 24 hr tablet [id number] Yes Take 1 tablet (50 mg total) by mouth daily. Take with or immediately following a meal. Oley Bascom RAMAN, NP  Active   ondansetron  (ZOFRAN ) 4 MG tablet [id number] Yes Take 1 tablet (4 mg total) by mouth every 8 (eight) hours as needed for nausea or vomiting. Oley Bascom RAMAN, NP  Active   rosuvastatin  (CRESTOR ) 40  MG tablet [id number] Yes Take 1 tablet (40 mg total) by mouth daily. Oley Bascom RAMAN, NP  Active             Recommendation:   PCP Follow-up Continue Current Plan of Care  Follow Up Plan:   Telephone follow up appointment date/time:   05/18/2024 @ 2:00 PM with Warren Quivers, RNCM   Olam Ku, RN, BSN [place]  [facility], [facility] RN Care Manager Direct Dial: [phone]  Fax: [phone]

## 2024-04-21 ENCOUNTER — Other Ambulatory Visit: Payer: Self-pay

## 2024-05-03 ENCOUNTER — Other Ambulatory Visit: Payer: Self-pay

## 2024-05-05 ENCOUNTER — Other Ambulatory Visit: Payer: Self-pay

## 2024-05-17 ENCOUNTER — Other Ambulatory Visit: Payer: Self-pay | Admitting: Nurse Practitioner

## 2024-05-17 DIAGNOSIS — E118 Type 2 diabetes mellitus with unspecified complications: Secondary | ICD-10-CM

## 2024-05-18 ENCOUNTER — Other Ambulatory Visit: Payer: Self-pay

## 2024-05-18 ENCOUNTER — Telehealth: Payer: Self-pay

## 2024-05-18 MED ORDER — INSULIN GLARGINE 100 UNIT/ML ~~LOC~~ SOLN
34.0000 [IU] | Freq: Every day | SUBCUTANEOUS | 2 refills | Status: AC
  Filled 2024-05-18: qty 30, 88d supply, fill #0

## 2024-05-18 NOTE — Telephone Encounter (Signed)
insulin glargine (LANTUS) 100 UNIT/ML injection

## 2024-05-18 NOTE — Patient Instructions (Addendum)
 [name] - I am sorry I was unable to reach you today for our scheduled appointment. I work with Oley Bascom RAMAN, NP and am calling to support your healthcare needs. Please contact me at [phone] at your earliest convenience. I look forward to speaking with you soon.  I have you rescheduled for 05/29/24 at 2:45 pm.  Thank you,  Warren Quivers RN CM Population Health-Complex Care Management [facility] [phone]

## 2024-05-29 ENCOUNTER — Telehealth: Payer: Self-pay

## 2024-06-14 ENCOUNTER — Ambulatory Visit: Payer: Self-pay | Admitting: Nurse Practitioner

## 2024-07-11 ENCOUNTER — Ambulatory Visit (HOSPITAL_BASED_OUTPATIENT_CLINIC_OR_DEPARTMENT_OTHER): Admitting: Pulmonary Disease
